# Patient Record
Sex: Female | Born: 1955 | Race: White | Hispanic: No | Marital: Single | Smoking: Former smoker
Health system: Southern US, Community
[De-identification: ages and names within clinical notes are randomized; demographics above are authoritative.]

## PROBLEM LIST (undated history)

## (undated) DIAGNOSIS — Z524 Kidney donor: Secondary | ICD-10-CM

## (undated) DIAGNOSIS — I10 Essential (primary) hypertension: Secondary | ICD-10-CM

## (undated) DIAGNOSIS — M1990 Unspecified osteoarthritis, unspecified site: Secondary | ICD-10-CM

## (undated) DIAGNOSIS — Z973 Presence of spectacles and contact lenses: Secondary | ICD-10-CM

## (undated) DIAGNOSIS — G43909 Migraine, unspecified, not intractable, without status migrainosus: Secondary | ICD-10-CM

## (undated) DIAGNOSIS — F419 Anxiety disorder, unspecified: Secondary | ICD-10-CM

## (undated) DIAGNOSIS — E079 Disorder of thyroid, unspecified: Secondary | ICD-10-CM

## (undated) DIAGNOSIS — T7840XA Allergy, unspecified, initial encounter: Secondary | ICD-10-CM

## (undated) DIAGNOSIS — Z972 Presence of dental prosthetic device (complete) (partial): Secondary | ICD-10-CM

## (undated) DIAGNOSIS — N189 Chronic kidney disease, unspecified: Secondary | ICD-10-CM

## (undated) HISTORY — DX: Allergy, unspecified, initial encounter: T78.40XA

## (undated) HISTORY — PX: BLADDER SURGERY: SHX569

## (undated) HISTORY — DX: Migraine, unspecified, not intractable, without status migrainosus: G43.909

## (undated) HISTORY — DX: Disorder of thyroid, unspecified: E07.9

## (undated) HISTORY — DX: Unspecified osteoarthritis, unspecified site: M19.90

## (undated) HISTORY — DX: Anxiety disorder, unspecified: F41.9

## (undated) HISTORY — PX: THYROID SURGERY: SHX805

## (undated) HISTORY — PX: NECK MASS EXCISION: SHX2079

---

## 1988-01-07 HISTORY — PX: NECK SURGERY: SHX720

## 1989-01-06 HISTORY — PX: ABDOMINAL HYSTERECTOMY: SHX81

## 1997-09-07 ENCOUNTER — Emergency Department (HOSPITAL_COMMUNITY): Admission: EM | Admit: 1997-09-07 | Discharge: 1997-09-08 | Payer: Self-pay | Admitting: Emergency Medicine

## 1997-09-18 ENCOUNTER — Emergency Department (HOSPITAL_COMMUNITY): Admission: EM | Admit: 1997-09-18 | Discharge: 1997-09-18 | Payer: Self-pay | Admitting: Emergency Medicine

## 1997-11-23 ENCOUNTER — Emergency Department (HOSPITAL_COMMUNITY): Admission: EM | Admit: 1997-11-23 | Discharge: 1997-11-23 | Payer: Self-pay

## 1998-06-12 ENCOUNTER — Encounter: Admission: RE | Admit: 1998-06-12 | Discharge: 1998-06-12 | Payer: Self-pay | Admitting: Obstetrics & Gynecology

## 1998-06-12 ENCOUNTER — Other Ambulatory Visit: Admission: RE | Admit: 1998-06-12 | Discharge: 1998-06-12 | Payer: Self-pay | Admitting: Obstetrics & Gynecology

## 1998-06-25 ENCOUNTER — Ambulatory Visit (HOSPITAL_COMMUNITY): Admission: RE | Admit: 1998-06-25 | Discharge: 1998-06-25 | Payer: Self-pay | Admitting: Obstetrics & Gynecology

## 1998-07-24 ENCOUNTER — Ambulatory Visit (HOSPITAL_COMMUNITY): Admission: RE | Admit: 1998-07-24 | Discharge: 1998-07-24 | Payer: Self-pay | Admitting: Obstetrics & Gynecology

## 1998-09-25 ENCOUNTER — Encounter: Admission: RE | Admit: 1998-09-25 | Discharge: 1998-09-25 | Payer: Self-pay | Admitting: Obstetrics & Gynecology

## 1998-10-29 ENCOUNTER — Emergency Department (HOSPITAL_COMMUNITY): Admission: EM | Admit: 1998-10-29 | Discharge: 1998-10-30 | Payer: Self-pay | Admitting: Family Medicine

## 1999-06-22 ENCOUNTER — Emergency Department (HOSPITAL_COMMUNITY): Admission: EM | Admit: 1999-06-22 | Discharge: 1999-06-22 | Payer: Self-pay | Admitting: Internal Medicine

## 2000-02-08 ENCOUNTER — Emergency Department (HOSPITAL_COMMUNITY): Admission: EM | Admit: 2000-02-08 | Discharge: 2000-02-08 | Payer: Self-pay | Admitting: Emergency Medicine

## 2000-04-06 ENCOUNTER — Encounter: Admission: RE | Admit: 2000-04-06 | Discharge: 2000-04-06 | Payer: Self-pay | Admitting: Gastroenterology

## 2000-04-06 ENCOUNTER — Encounter: Payer: Self-pay | Admitting: Gastroenterology

## 2000-05-08 ENCOUNTER — Other Ambulatory Visit: Admission: RE | Admit: 2000-05-08 | Discharge: 2000-05-08 | Payer: Self-pay | Admitting: Obstetrics and Gynecology

## 2001-05-18 ENCOUNTER — Other Ambulatory Visit: Admission: RE | Admit: 2001-05-18 | Discharge: 2001-05-18 | Payer: Self-pay | Admitting: Obstetrics and Gynecology

## 2002-09-05 ENCOUNTER — Other Ambulatory Visit: Admission: RE | Admit: 2002-09-05 | Discharge: 2002-09-05 | Payer: Self-pay | Admitting: Gynecology

## 2003-05-01 ENCOUNTER — Inpatient Hospital Stay (HOSPITAL_COMMUNITY): Admission: EM | Admit: 2003-05-01 | Discharge: 2003-05-03 | Payer: Self-pay | Admitting: Emergency Medicine

## 2003-06-24 ENCOUNTER — Encounter: Admission: RE | Admit: 2003-06-24 | Discharge: 2003-06-24 | Payer: Self-pay | Admitting: Emergency Medicine

## 2004-02-12 ENCOUNTER — Other Ambulatory Visit (HOSPITAL_COMMUNITY): Admission: RE | Admit: 2004-02-12 | Discharge: 2004-02-21 | Payer: Self-pay | Admitting: Psychiatry

## 2004-02-12 ENCOUNTER — Ambulatory Visit: Payer: Self-pay | Admitting: Psychiatry

## 2006-01-06 DIAGNOSIS — Z524 Kidney donor: Secondary | ICD-10-CM

## 2006-01-06 HISTORY — DX: Kidney donor: Z52.4

## 2006-01-06 HISTORY — PX: KIDNEY DONATION: SHX685

## 2006-04-08 ENCOUNTER — Ambulatory Visit: Payer: Self-pay | Admitting: Internal Medicine

## 2006-08-31 ENCOUNTER — Emergency Department (HOSPITAL_COMMUNITY): Admission: EM | Admit: 2006-08-31 | Discharge: 2006-08-31 | Payer: Self-pay | Admitting: Emergency Medicine

## 2006-10-15 ENCOUNTER — Ambulatory Visit: Payer: Self-pay | Admitting: Pulmonary Disease

## 2006-10-15 ENCOUNTER — Ambulatory Visit: Admission: RE | Admit: 2006-10-15 | Discharge: 2006-10-15 | Payer: Self-pay | Admitting: Pulmonary Disease

## 2007-03-05 ENCOUNTER — Ambulatory Visit: Payer: Self-pay | Admitting: Internal Medicine

## 2007-03-05 DIAGNOSIS — F17200 Nicotine dependence, unspecified, uncomplicated: Secondary | ICD-10-CM

## 2007-03-05 DIAGNOSIS — J449 Chronic obstructive pulmonary disease, unspecified: Secondary | ICD-10-CM | POA: Insufficient documentation

## 2007-03-05 DIAGNOSIS — J4489 Other specified chronic obstructive pulmonary disease: Secondary | ICD-10-CM | POA: Insufficient documentation

## 2007-03-05 DIAGNOSIS — J441 Chronic obstructive pulmonary disease with (acute) exacerbation: Secondary | ICD-10-CM | POA: Insufficient documentation

## 2007-03-10 ENCOUNTER — Telehealth (INDEPENDENT_AMBULATORY_CARE_PROVIDER_SITE_OTHER): Payer: Self-pay | Admitting: *Deleted

## 2007-04-01 DIAGNOSIS — Z9189 Other specified personal risk factors, not elsewhere classified: Secondary | ICD-10-CM | POA: Insufficient documentation

## 2007-04-01 DIAGNOSIS — G43909 Migraine, unspecified, not intractable, without status migrainosus: Secondary | ICD-10-CM | POA: Insufficient documentation

## 2007-04-01 DIAGNOSIS — J309 Allergic rhinitis, unspecified: Secondary | ICD-10-CM | POA: Insufficient documentation

## 2007-04-02 ENCOUNTER — Encounter: Payer: Self-pay | Admitting: Pulmonary Disease

## 2009-03-04 ENCOUNTER — Ambulatory Visit (HOSPITAL_COMMUNITY): Admission: RE | Admit: 2009-03-04 | Discharge: 2009-03-04 | Payer: Self-pay | Admitting: Obstetrics and Gynecology

## 2009-03-19 ENCOUNTER — Ambulatory Visit (HOSPITAL_BASED_OUTPATIENT_CLINIC_OR_DEPARTMENT_OTHER): Admission: RE | Admit: 2009-03-19 | Discharge: 2009-03-19 | Payer: Self-pay | Admitting: Urology

## 2009-04-25 ENCOUNTER — Telehealth (INDEPENDENT_AMBULATORY_CARE_PROVIDER_SITE_OTHER): Payer: Self-pay | Admitting: *Deleted

## 2009-08-09 ENCOUNTER — Ambulatory Visit: Payer: Self-pay | Admitting: Internal Medicine

## 2009-08-09 DIAGNOSIS — R634 Abnormal weight loss: Secondary | ICD-10-CM
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
## 2010-02-05 NOTE — Assessment & Plan Note (Signed)
Summary: NEW PT EST // RS   Vital Signs:  Patient profile:   55 year old female Height:      66 inches Weight:      117 pounds BMI:     18.95 Temp:     98.0 degrees F oral BP sitting:   118 / 82  (right arm) Cuff size:   regular  Vitals Entered By: Duard Brady LPN (August 09, 2009 2:35 PM) CC: new to establish - c/o (L) ear 'stopped up' Is Patient Diabetic? No   Primary Care Provider:  Dr. Coralyn Helling  CC:  new to establish - c/o (L) ear 'stopped up'.  History of Present Illness: 55 year old patient who is in today to establish with our practice.  Her main concern today is weight loss.  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  Her weight was hundred and 41 pounds.  Her present weight is 117.  She states that for a number of years.  Her weight had been stable in the 115 range.  She otherwise feels well with a normal appetite.  She is a one half pack per day smoker.  Due to the kidney donation.  The patient has had extensive preoperative evaluation and also post procedure evaluation earlier this spring.  This has included a chest x-ray.  She has also been evaluating by her gynecologist.  Laboratory studies including thyroid studies have been normal. She does have a history of COPD allergic rhinitis and ongoing tobacco use  Preventive Screening-Counseling & Management  Alcohol-Tobacco     Smoking Status: current     Smoking Cessation Counseling: yes  Allergies: 1)  ! Sulfa  Past History:  Past Medical History: Reviewed history from 04/01/2007 and no changes required. COPD - with family history. Seen on CXR. In 10/2006 -> FEV1/FVC ratio of 70%. Her FEV1 was 2.54 liters/86% (per Dr. Evlyn Courier dictation in 10/2006) TOBACCO ABUSE - unable to quit. Chantix intolerance.  Anxiety Sinus issues - on claritin Denies DM, BP, High Cholesterol, STroke, MI, CAD, TB Current Problems:  ALLERGIC RHINITIS (ICD-477.9) CHEST PAIN, ATYPICAL, HX OF  (ICD-V15.89) MIGRAINE HEADACHE (ICD-346.90) TOBACCO ABUSE (ICD-305.1) C O P D (ICD-496) PREOPERATIVE EXAMINATION (ICD-V72.84)    Past Surgical History: colonoscopy 2009 left nephrectomy (Donation) May 2010 Hysterectomy bladder tack 1991 abdominal surgery for adhesions in 1992 right cervical lymphadenectomy 1986 through 1989  Family History: Reviewed history and no changes required. father died age 10, history of COPD from pneumonia mother, age 96, with osteoarthritis, osteoporosis two brothers, both deceased one died at age 32 from a cerebral aneurysm.  One died of suicide death at 34 two sisters, ages 5 and 22 in good health  Social History: Reviewed history and no changes required. 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  The patient denies anorexia, fever, weight loss, weight gain, decreased hearing, hoarseness, chest pain, syncope, dyspnea on exertion, peripheral edema, prolonged cough, headaches, hemoptysis, abdominal pain, melena, hematochezia, severe indigestion/heartburn, hematuria, incontinence, genital sores, muscle weakness, suspicious skin lesions, transient blindness, difficulty walking, depression, unusual weight change, abnormal bleeding, enlarged lymph nodes, angioedema, and breast masses.    Physical Exam  General:  underweight appearing.  normal blood pressure Head:  Normocephalic and atraumatic without obvious abnormalities. No apparent alopecia or balding. Eyes:  No corneal or conjunctival inflammation noted. EOMI. Perrla. Funduscopic exam benign, without  hemorrhages, exudates or papilledema. Vision grossly normal. Ears:  External ear exam shows no significant lesions or deformities.  Otoscopic examination reveals clear canals, tympanic membranes are intact bilaterally without bulging, retraction, inflammation or discharge. Hearing is grossly normal bilaterally. Mouth:  Oral  mucosa and oropharynx without lesions or exudates.  Teeth in good repair. Neck:  No deformities, masses, or tenderness noted. surgical scar, right lateral neck Chest Wall:  No deformities, masses, or tenderness noted. Lungs:  Normal respiratory effort, chest expands symmetrically. Lungs are clear to auscultation, no crackles or wheezes. Heart:  Normal rate and regular rhythm. S1 and S2 normal without gallop, murmur, click, rub or other extra sounds. Abdomen:  Bowel sounds positive,abdomen soft and non-tender without masses, organomegaly or hernias noted. Msk:  No deformity or scoliosis noted of thoracic or lumbar spine.   Pulses:  R and L carotid,radial,femoral,dorsalis pedis and posterior tibial pulses are full and equal bilaterally Extremities:  No clubbing, cyanosis, edema, or deformity noted with normal full range of motion of all joints.   Neurologic:  alert & oriented X3, cranial nerves II-XII intact, strength normal in all extremities, and DTRs symmetrical and normal.  no tremor Skin:  Intact without suspicious lesions or rashes Cervical Nodes:  No lymphadenopathy noted Axillary Nodes:  No palpable lymphadenopathy Inguinal Nodes:  No significant adenopathy Psych:  Cognition and judgment appear intact. Alert and cooperative with normal attention span and concentration. No apparent delusions, illusions, hallucinations   Impression & Recommendations:  Problem # 1:  TOBACCO ABUSE (ICD-305.1)  Problem # 2:  WEIGHT LOSS, RECENT (ICD-783.21) believe that her present weight is her usual lean weight;  will continue to observed.  Doubtful that there will be any further significant weight loss.  She will call the office if there is any further weight reduction  Complete Medication List: 1)  Vitamin B-12 500 Mcg Tabs (Cyanocobalamin) .... Qd 2)  Acetasol Hc 2-1 % Soln (Hydrocortisone-acetic acid) .... Two drops ou qid  Patient Instructions: 1)  Tobacco is very bad for your health and your  loved ones! You Should stop smoking!. 2)  call if there is any further weight loss Prescriptions: ACETASOL HC 2-1 % SOLN (HYDROCORTISONE-ACETIC ACID) two drops OU QID  #10 cc x 2   Entered and Authorized by:   Gordy Savers  MD   Signed by:   Gordy Savers  MD on 08/09/2009   Method used:   Print then Give to Patient   RxID:   [id number]

## 2010-05-21 NOTE — Assessment & Plan Note (Signed)
[facility]                             PULMONARY OFFICE NOTE   NAME:[name]                    MRN:          [id number]  DATE:10/15/2006                            DOB:          06-Dec-1955    I met [name] today for evaluation of her emphysema.   She was seen by my partner, Dr. Sandrea Hughs, in April 2008 with  complaints of her chest hurting. At that time, it was felt that she  probably had some degree of emphysema, and she was advised to have a  chest x-ray and pulmonary function tests. She was also advised to  discontinue her smoking and possibly consider undergoing evaluation for  alpha-1 antitrypsin deficiency. She returns today for a second opinion  with regards to her emphysema. She said that in March of 2008, there was  some Holiday representative around her workplace, and she was exposed to some dust  and some paint, and she said that she developed a tightness and pain in  her chest, which prompted her to undergo a chest x-ray, which showed  changes of emphysema, and that is when she was referred to Dr. Sherene Sires.  Since that time, she says that her chest discomfort has improved,  although she does still get this occasionally, but she also has problems  with allergies when she is exposed to grass, walnuts, and corn. She will  occasionally get itchy eyes, nasal congestion, as well as sneezing. She  will also occasionally get a tickle in her throat, which she says is  better with Claritin, which she uses on an as-needed basis. However, she  denies any symptoms of dyspnea on exertion, coughing, wheezing, sputum  production, or hemoptysis. She has not had anything as far as fevers,  chills, sweats, or weight loss. She continues to smoke cigarettes, but  she has decreased the amount significantly. She says that her stimulants  for this is with the hopes that she will be able to become a organ donor  for her daughter who needs a renal transplant.  She has not had any  recent travel history. She has not had any exposure to farm animals or  birds. There is no significant travel history.   PAST MEDICAL HISTORY:  Significant for migraines and chronic obstructive  pulmonary disease with emphysema.   PAST SURGICAL HISTORY:  Significant for a partial hysterectomy, bladder  tacking, and gland removal from the right side of her neck.   CURRENT MEDICATIONS:  Claritin over-the-counter as needed.   ALLERGIES:  SULFA which causes her to develop a rash.   FAMILY HISTORY:  Significant for her father who had chronic obstructive  pulmonary disease at the age of 78 and her sister who had chronic  obstructive pulmonary disease at the age of 69.   REVIEW OF SYSTEMS:  Unremarkable except for as stated above.   SOCIAL HISTORY:  She continues to smoke about 3 to 4 cigarettes a day.  She denies any significant alcohol use. She works as a Horticulturist, commercial.   PHYSICAL EXAMINATION:  VITAL SIGNS:  Weight 141 pounds, temperature  97.8,  blood pressure 122/78, heart rate 76, oxygen saturation 99% on  room air.  HEENT:  Pupils reactive. There is no sinus tenderness. No nasal  discharge. No oral lesions. No lymphadenopathy.  NECK:  She had surgical changes on the right side of her neck. There is  no thyromegaly.  HEART:  S1, S2.  CHEST:  Decreased breath sounds with a prolonged expiratory phase, but  no wheezing or rales.  ABDOMEN:  Thin, soft, nontender. Positive bowel sounds.  EXTREMITIES:  No cyanosis, clubbing, or edema.  NEUROLOGIC:  No focal deficits were appreciated.   Chest x-ray from today showed hyperinflation and changes with consistent  with emphysema, but no acute disease process.   Pulmonary function tests in my office today showed a post-bronchodilator  FEV1/FVC ratio of 70%. Her FEV1 was 2.54 liters, which was 86% of  predicted. Her FVC was 3.57 liters which was 95% of predicted. There was  no significant bronchodilator response. Her  total lung capacity was 5.15  liters which was 92% predicted. Her diffusion capacity was 86% of  predicted.  These findings would be consistent with mild airflow  obstruction, normal lung volumes, and normal diffusion capacity without  significant bronchodilator response.   IMPRESSION:  1. Chronic obstructive pulmonary disease with emphysema. She, again,      is essentially asymptomatic and therefore, I do not think that she      would need to do anything more except continue on her smoking      cessation regimen. I have discussed with her that given her family      history, there is a possibility that she could have alpha-1      antitrypsin deficiency, and it may be beneficial for her to undergo      further laboratory testing to determine this. She would prefer to      defer this at the present time.  2. I have administered an influenza, as well as pneumococcal      vaccination for her.  3. Atypical chest pain. I have advised her that she should follow up      with her primary physician for further assessment of this.  4. Symptoms of allergic rhinitis. She is to continue on her Claritin      on an as-needed basis.   I have advised her that if she were to notice any worsening of her  symptoms, or if she would like to further investigate the possibility of  an alpha-1 antitrypsin deficiency, that she could call to reschedule a  follow up appointment, otherwise she is to receive the remainder of her  care with her primary care physician.     Coralyn Helling, MD  Electronically Signed    VS/MedQ  DD: 10/15/2006  DT: 10/16/2006  Job #: [id number]
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
## 2010-05-24 NOTE — Assessment & Plan Note (Signed)
[facility]                             PULMONARY OFFICE NOTE   NAME:[name]                    MRN:          [id number]  DATE:04/08/2006                            DOB:          03/16/1955    CHIEF COMPLAINT:  Hurting in chest.   HISTORY:  This is a 55 year old white female, active smoker, who says  she has been hurting on-and-off in her chest for about the last ten  years. The shortest she hurts is from several hours to 4 to 5 days and  typically is triggered by arguments with her daughter. It is not  pleuritic or exertional and more of a generalized chest tightness that  seems some better after using albuterol. She has also been evaluated for  the same discomfort and found to have ulcers, but is not consistent  about taking any medications. Unfortunately, she is consistent about  continuing to smoke and shows very little motivation to stop. She denies  any excessive sputum production, fevers, chills, sweats, orthopnea, PND  or leg swelling and actually is not short of breath with exertion nor  has any exertional or pleuritic chest pain as noted above. Denies  unintended weight loss.   PAST MEDICAL HISTORY:  Significant for:  1. Allergies and sinus problems.  2. Chronic headaches.  3. She has had multiple back and neck surgeries.  4. Remote hysterectomy.   ALLERGIES:  SULFA CAUSES A RASH. SHE GETS SICK IN HER STOMACH FROM  ANTIBIOTICS.   MEDICATIONS:  None regularly. She takes Zyrtec p.r.n.   SOCIAL HISTORY:  She continues to smoke a pack every two days. She works  as a Horticulturist, commercial.   FAMILY HISTORY:  Is positive for emphysema in her mother, father and  sister, all of whom smoked.   REVIEW OF SYSTEMS:  Taken in detail on the worksheet and negative as  outlined above except for occasional acid reflux symptoms.   PHYSICAL EXAMINATION:  This is a depressed, anxious appearing,  ambulatory white female in no acute distress. She  has stable vital  signs.  HEENT: Is unremarkable. Oropharynx is clear.  NECK: Supple without cervical adenopathy or tenderness. Trachea is  midline without thyromegaly.  LUNGS: Lung fields reveal hyperresonant percussion with Hoover sign only  at the end of inspiration.  HEART: Regular rate and rhythm without murmur, gallop or rub. S1, S2  were diminished. There was no increase in pulmonic component to S2.  ABDOMEN: Soft, benign with positive Hoover sign at the very end of  inspiration.  EXTREMITIES: Warm without calf tenderness, cyanosis, clubbing or edema.   Chest x-ray was done at [facility] recently and is not  available.   IMPRESSION:  Chest tightness that goes back over 10 years without any  pleuritic or exertional features, probably is either functionally  related to reflux, which apparently has been already been diagnosed and  for which it was recommended she take Prilosec, but she has not been  compliant. I reviewed the diet with her for reflux and recommended that  she take either Prevacid (samples of a box given)  or Prilosec over-the-  counter perfectly regularly before meals daily.   I would like to see her back for followup PFTs and chest x-ray if not  done in the meantime in 4 to 6 weeks. I spent most of the office visit  talking about the longterm effects of cigarette smoking in the context  of her concern that Chantix costs too much. I tried to call to her  attention the fact that both the direct and indirect costs of smoking  will pale in comparison over the next ten years relative to her cost of  Chantix in the short run.   Finally, I did note that she has a positive family history of emphysema,  but only in smokers. An alpha-1 antitrypsin level should probably be  done to be complete at some point perhaps when she returns for PFTs.     [name]. [name] Sires, MD, [facility]  Electronically Signed    MBW/MedQ  DD: 04/08/2006  DT: 04/08/2006  Job #: [id number]

## 2010-08-29 ENCOUNTER — Emergency Department (HOSPITAL_COMMUNITY): Payer: Self-pay

## 2010-08-29 ENCOUNTER — Emergency Department (HOSPITAL_COMMUNITY)
Admission: EM | Admit: 2010-08-29 | Discharge: 2010-08-29 | Disposition: A | Discharge status: Home / Self Care | Payer: Self-pay | Attending: Emergency Medicine | Admitting: Emergency Medicine

## 2010-08-29 DIAGNOSIS — S52599A Other fractures of lower end of unspecified radius, initial encounter for closed fracture: Secondary | ICD-10-CM | POA: Insufficient documentation

## 2010-08-29 DIAGNOSIS — W19XXXA Unspecified fall, initial encounter: Secondary | ICD-10-CM | POA: Insufficient documentation

## 2010-08-29 DIAGNOSIS — J449 Chronic obstructive pulmonary disease, unspecified: Secondary | ICD-10-CM | POA: Insufficient documentation

## 2010-08-29 DIAGNOSIS — J4489 Other specified chronic obstructive pulmonary disease: Secondary | ICD-10-CM | POA: Insufficient documentation

## 2010-08-29 DIAGNOSIS — M25539 Pain in unspecified wrist: Secondary | ICD-10-CM | POA: Insufficient documentation

## 2010-10-18 LAB — I-STAT 8, (EC8 V) (CONVERTED LAB)
BUN: 14
Chloride: 105
Glucose, Bld: 85
Hemoglobin: 14.3
Potassium: 3.8
Sodium: 139
TCO2: 30

## 2010-10-18 LAB — DIFFERENTIAL
Basophils Absolute: 0
Basophils Relative: 0
Eosinophils Absolute: 0.1
Neutro Abs: 3.9
Neutrophils Relative %: 68

## 2010-10-18 LAB — CBC
HCT: 39.6
MCV: 89.6
RBC: 4.42
WBC: 5.7

## 2010-10-18 LAB — POCT CARDIAC MARKERS
CKMB, poc: <1 — ABNORMAL LOW
Myoglobin, poc: 56.1
Operator id: 151321
Troponin i, poc: <0.05

## 2010-12-26 ENCOUNTER — Emergency Department (INDEPENDENT_AMBULATORY_CARE_PROVIDER_SITE_OTHER): Payer: Self-pay

## 2010-12-26 ENCOUNTER — Emergency Department (INDEPENDENT_AMBULATORY_CARE_PROVIDER_SITE_OTHER)
Admission: EM | Admit: 2010-12-26 | Discharge: 2010-12-26 | Disposition: A | Discharge status: Home / Self Care | Payer: Self-pay | Source: Home / Self Care | Attending: Emergency Medicine | Admitting: Emergency Medicine

## 2010-12-26 ENCOUNTER — Encounter: Payer: Self-pay | Admitting: *Deleted

## 2010-12-26 DIAGNOSIS — M25539 Pain in unspecified wrist: Secondary | ICD-10-CM

## 2010-12-26 DIAGNOSIS — M25532 Pain in left wrist: Secondary | ICD-10-CM

## 2010-12-26 MED ORDER — HYDROCODONE-ACETAMINOPHEN 5-325 MG PO TABS: 1.0000 | ORAL_TABLET | ORAL | Status: AC | PRN

## 2010-12-26 NOTE — ED Notes (Signed)
Pt fell 1 week ago and is here with complaints of left wrist pain.

## 2010-12-26 NOTE — ED Notes (Signed)
Cancel splint wrist order Per MD, Pt states she has one at home.

## 2010-12-26 NOTE — ED Provider Notes (Signed)
History     CSN: [id number]  Arrival date & time 12/26/10  1610   First MD Initiated Contact with Patient 12/26/10 1631      Chief Complaint  Patient presents with  . Wrist Pain   HPI Comments: righthanded female states she tripped and fell landing on her left wrist approx 5 days ago. Unable to remember if landed on outstretched or flexed wrist. Now with inability to flex/extend wrist secondary to pain. Pain aggravated with curling fingers.  No numbness, deformity in hand or distal forearm. Noted bruising today.  Taking tylenol with mild relief. Tried ice, heat w/o improvement. On chart review, pt with mildly impacted and dorsally angulated distal radius fx on 08/2010  Asked pt about fx in Aug. Pt was in brace until Nov. Wrist fx was managed by Dr. Thurston Hole. Apparently wrist has been inflexible since getting out of brace. Pt now states she did not fall 5 days ago. the patient now states she just wanted a second opinion. Is concerned because the home exercises are very painful and has not made significant recovery. Was supposed to see Dr. Thurston Hole yesterday for follow up but cancelled it due to financial concerns.  Patient is a 55 y.o. female presenting with wrist pain. The history is provided by the patient. No language interpreter was used.  Wrist Pain This is a new problem. The current episode started more than 2 days ago. The problem occurs constantly. The problem has been gradually worsening. She has tried acetaminophen and a cold compress for the symptoms. The treatment provided mild relief.    History reviewed. No pertinent past medical history.  Past Surgical History  Procedure Date  . Kidney donation     History reviewed. No pertinent family history.  History  Substance Use Topics  . Smoking status: Current Everyday Smoker  . Smokeless tobacco: Not on file  . Alcohol Use: No    OB History    Grav Para Term Preterm Abortions TAB SAB Ect Mult Living                   Review of Systems  Constitutional: Negative for fever.  Gastrointestinal: Negative for nausea and vomiting.  Musculoskeletal: Positive for arthralgias.  Skin: Positive for color change.  Neurological: Negative for weakness and numbness.  Hematological: Does not bruise/bleed easily.    Allergies  Nsaids and Sulfonamide derivatives  Home Medications   Current Outpatient Rx  Name Route Sig Dispense Refill  . ACETAMINOPHEN 500 MG PO TABS Oral Take 500 mg by mouth every 6 (six) hours as needed.      Marland Kitchen HYDROCODONE-ACETAMINOPHEN 5-325 MG PO TABS Oral Take 1 tablet by mouth every 4 (four) hours as needed for pain. 20 tablet 0    BP 151/93  Pulse 69  Temp(Src) 98 F (36.7 C) (Oral)  Resp 20  SpO2 100%  Physical Exam  Nursing note and vitals reviewed. Constitutional: She is oriented to person, place, and time. She appears well-developed and well-nourished. No distress.  HENT:  Head: Normocephalic and atraumatic.  Eyes: Conjunctivae and EOM are normal. Pupils are equal, round, and reactive to light.  Neck: Normal range of motion.  Cardiovascular: Regular rhythm.   Pulmonary/Chest: Effort normal and breath sounds normal.  Abdominal: She exhibits no distension.  Musculoskeletal: Normal range of motion.       Left Wrist distal radius  tender, distal ulnar styloid NT, snuffbox  tender, carpals tender, metacarpals NT , digits NT r, Motor intact ability  to flex / extend digits of L hand, Sensation LT to hand normal distal to injury, CR<2 seconds distally.  Shoulder and upper arm NT, Elbow and proximal forearm NT. Unable to actively flex/extend wrist. Pain with ulnar deviation. Bruising thenar eminence and volar aspect of wrist.   Neurological: She is alert and oriented to person, place, and time.  Skin: Skin is warm and dry.  Psychiatric: She has a normal mood and affect. Her behavior is normal. Judgment and thought content normal.    ED Course  Procedures (including critical care  time)  Labs Reviewed - No data to display Dg Wrist Complete Left  12/26/2010  *RADIOLOGY REPORT*  Clinical Data: Fall, bruising.  LEFT WRIST - COMPLETE 3+ VIEW  Comparison: 08/29/2010  Findings: Old healed distal left radial fracture.  No acute fracture, subluxation or dislocation.  Degenerative changes in the wrist.  Bone mineralization is normal.  Soft tissues are intact.  IMPRESSION: No acute bony abnormality.  Original Report Authenticated By: Cyndie Chime, M.D.     1. Wrist pain, left       MDM  Pt with briusing ventrally and on thenar eminence. Injury occurred 5 days ago. Pain with flex/ext, ulnar deviation. XR to r/o fx. If neg will immobilize, ice, norco repeat films in 5-7 days.   OTHER DATA REVIEWED: Nursing notes and vital signs reviewed. Prior records reviewed as noted in HPI Imaging reviewed by myself. Report per radiologist.   All results reviewed and discussed, questions answered. Pt's problem most likely from prolonged immobilization. No evidence of nerve, vascular damage or acute bony injury.  Pt states has brace at home and does not need one today. Had extensive discussion on followup with her orthopedic surgeon and that she will require PT. pt agreeable with plan.    Luiz Blare, MD 12/27/10 0010

## 2011-01-23 ENCOUNTER — Ambulatory Visit: Payer: Self-pay | Attending: Orthopedic Surgery | Admitting: Rehabilitative and Restorative Service Providers"

## 2011-01-23 DIAGNOSIS — M6281 Muscle weakness (generalized): Secondary | ICD-10-CM | POA: Insufficient documentation

## 2011-01-23 DIAGNOSIS — IMO0001 Reserved for inherently not codable concepts without codable children: Secondary | ICD-10-CM | POA: Insufficient documentation

## 2011-01-27 ENCOUNTER — Ambulatory Visit: Payer: Self-pay | Admitting: Physical Therapy

## 2011-01-30 ENCOUNTER — Ambulatory Visit: Payer: Self-pay | Admitting: Physical Therapy

## 2011-02-03 ENCOUNTER — Ambulatory Visit: Payer: Self-pay | Admitting: Physical Therapy

## 2011-02-06 ENCOUNTER — Ambulatory Visit: Payer: Self-pay | Admitting: Physical Therapy

## 2011-02-10 ENCOUNTER — Encounter: Payer: Self-pay | Admitting: Physical Therapy

## 2011-02-12 ENCOUNTER — Encounter: Payer: Self-pay | Admitting: Physical Therapy

## 2011-02-12 ENCOUNTER — Ambulatory Visit: Payer: Self-pay | Admitting: Family Medicine

## 2011-02-13 ENCOUNTER — Encounter: Payer: Self-pay | Admitting: Family Medicine

## 2011-02-13 ENCOUNTER — Ambulatory Visit (INDEPENDENT_AMBULATORY_CARE_PROVIDER_SITE_OTHER): Payer: Self-pay | Admitting: Family Medicine

## 2011-02-13 VITALS — BP 148/89 | HR 79 | Ht 67.0 in | Wt 119.0 lb

## 2011-02-13 DIAGNOSIS — M4722 Other spondylosis with radiculopathy, cervical region: Secondary | ICD-10-CM

## 2011-02-13 DIAGNOSIS — M4712 Other spondylosis with myelopathy, cervical region: Secondary | ICD-10-CM

## 2011-02-13 MED ORDER — AMITRIPTYLINE HCL 25 MG PO TABS: 25.0000 mg | ORAL_TABLET | Freq: Every evening | ORAL | Status: DC | PRN

## 2011-02-13 MED ORDER — METHYLPREDNISOLONE 4 MG PO KIT: PACK | ORAL | Status: AC

## 2011-02-13 NOTE — Progress Notes (Signed)
  Subjective:    Patient ID: [name], female    DOB: [birth year], 56 y.o.   MRN: [id number]  HPI  [name] is a pleasant 56 yo female  the patient comes in today complaining of left upper extremity cervical radiculopathy last month. She stated that she fell on 082012 and fractured her left radius and she think that on that for she may have injured her neck as well. Her records show CT of her C-spine 2005 which reports spondylosis of her C-spine on C5-C6 with foraminal narrowing. She been doing physical therapy for her left wrist fracture in 1 day use the TENS unit that produces pain radiated to her neck. Her pain is on and off, sharp pain, radiated to her left upper extremity. Numbness or tingling present. No weakness . She never has done physical therapy for her C-spine  Patient Active Problem List  Diagnoses  . TOBACCO ABUSE  . MIGRAINE HEADACHE  . ALLERGIC RHINITIS  . C O P D  . WEIGHT LOSS, RECENT  . CHEST PAIN, ATYPICAL, HX OF     Current Outpatient Prescriptions on File Prior to Visit  Medication Sig Dispense Refill  . acetaminophen (TYLENOL) 500 MG tablet Take 500 mg by mouth every 6 (six) hours as needed.         Allergies  Allergen Reactions  . 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 Sulfonamide Derivatives      Review of Systems  Constitutional: Negative for fever, chills, diaphoresis and fatigue.  Musculoskeletal: Negative for back pain, joint swelling, arthralgias and gait problem.  Neurological: Negative for weakness and numbness.       Objective:   Physical Exam  Constitutional: She is oriented to person, place, and time. She appears well-developed and well-nourished.       BP 148/89  Pulse 79  Ht 5\' 7"  (1.702 m)  Wt 119 lb (53.978 kg)  BMI 18.64 kg/m2   Pulmonary/Chest: Effort normal.  Musculoskeletal:       Neck  with intact skin. No swelling, no hematomas. FROM  for flexion, extension, rotation and lateralization with pain at  the extremes.  Tenderness to palpation in the posterior aspect of her neck. Spurling test positive to the left Strength 5/5 for shoulder flexion and extension, 5/5 for elbow flexion and extension, 5/5 for wrist flexion and extension B/L. DTR biceps,triceps and brachioradialis II/IV B/L. Sensation intact distally B/L.    Neurological: She is alert and oriented to person, place, and time.  Skin: Skin is warm. No rash noted. No erythema. No pallor.  Psychiatric: She has a normal mood and affect. Her behavior is normal. Thought content normal.          Assessment & Plan:   1. Cervical spondylosis with radiculopathy  methylPREDNISolone (MEDROL, PAK,) 4 MG tablet, DG Cervical Spine Complete, amitriptyline (ELAVIL) 25 MG tablet   C spine x ray Medrol dose pack Amitriptyline 25 mg po q HS I will call her after x rays are completed. She may need PT for her c spine.

## 2011-02-19 ENCOUNTER — Ambulatory Visit (HOSPITAL_COMMUNITY)
Admission: RE | Admit: 2011-02-19 | Discharge: 2011-02-19 | Disposition: A | Discharge status: Home / Self Care | Payer: Self-pay | Source: Ambulatory Visit | Attending: Family Medicine | Admitting: Family Medicine

## 2011-02-19 DIAGNOSIS — M79609 Pain in unspecified limb: Secondary | ICD-10-CM | POA: Insufficient documentation

## 2011-02-19 DIAGNOSIS — M542 Cervicalgia: Secondary | ICD-10-CM | POA: Insufficient documentation

## 2011-02-19 DIAGNOSIS — M5030 Other cervical disc degeneration, unspecified cervical region: Secondary | ICD-10-CM | POA: Insufficient documentation

## 2011-02-20 ENCOUNTER — Telehealth: Payer: Self-pay | Admitting: Internal Medicine

## 2011-02-20 DIAGNOSIS — M5412 Radiculopathy, cervical region: Secondary | ICD-10-CM

## 2011-02-20 MED ORDER — GABAPENTIN 300 MG PO CAPS: ORAL_CAPSULE | ORAL | Status: DC

## 2011-02-20 NOTE — Telephone Encounter (Signed)
Patient called to let you know she had the xrays done yesterday.  She is wondering when she will be contacted with the results.

## 2011-02-20 NOTE — Telephone Encounter (Signed)
We discussed the result of her c spine  x ray showing DDD from c5 to c7.  She will continue PT and we will start her on neurontin 300 mg po hs for 2 weeks and then bid

## 2011-08-16 ENCOUNTER — Emergency Department (HOSPITAL_COMMUNITY)
Admission: EM | Admit: 2011-08-16 | Discharge: 2011-08-16 | Disposition: A | Discharge status: Home / Self Care | Payer: Self-pay | Attending: Emergency Medicine | Admitting: Emergency Medicine

## 2011-08-16 ENCOUNTER — Encounter (HOSPITAL_COMMUNITY): Payer: Self-pay | Admitting: *Deleted

## 2011-08-16 DIAGNOSIS — F17200 Nicotine dependence, unspecified, uncomplicated: Secondary | ICD-10-CM | POA: Insufficient documentation

## 2011-08-16 DIAGNOSIS — E860 Dehydration: Secondary | ICD-10-CM | POA: Insufficient documentation

## 2011-08-16 DIAGNOSIS — K2970 Gastritis, unspecified, without bleeding: Secondary | ICD-10-CM | POA: Insufficient documentation

## 2011-08-16 DIAGNOSIS — N289 Disorder of kidney and ureter, unspecified: Secondary | ICD-10-CM | POA: Insufficient documentation

## 2011-08-16 LAB — POCT I-STAT, CHEM 8
Calcium, Ion: 1.28 mmol/L — ABNORMAL HIGH (ref 1.12–1.23)
HCT: 41 % (ref 36.0–46.0)
Hemoglobin: 13.9 g/dL (ref 12.0–15.0)
Sodium: 142 mEq/L (ref 135–145)
TCO2: 26 mmol/L (ref 0–100)

## 2011-08-16 LAB — CBC WITH DIFFERENTIAL/PLATELET
Basophils Relative: 1 % (ref 0–1)
Eosinophils Absolute: 0.2 10*3/uL (ref 0.0–0.7)
Lymphs Abs: 1.6 10*3/uL (ref 0.7–4.0)
MCH: 31.2 pg (ref 26.0–34.0)
Neutrophils Relative %: 63 % (ref 43–77)
Platelets: 177 10*3/uL (ref 150–400)
RBC: 4.58 MIL/uL (ref 3.87–5.11)

## 2011-08-16 LAB — URINALYSIS, ROUTINE W REFLEX MICROSCOPIC
Bilirubin Urine: NEGATIVE
Hgb urine dipstick: NEGATIVE
Protein, ur: NEGATIVE mg/dL
Urobilinogen, UA: 0.2 mg/dL (ref 0.0–1.0)

## 2011-08-16 LAB — URINE MICROSCOPIC-ADD ON

## 2011-08-16 NOTE — ED Provider Notes (Signed)
History     CSN: [id number]  Arrival date & time 08/16/11  1341   First MD Initiated Contact with Patient 08/16/11 1821      Chief Complaint  Patient presents with  . Abdominal Pain  . Nausea  . Back Pain    (Consider location/radiation/quality/duration/timing/severity/associated sxs/prior treatment) HPI Comments: [name] is a 56 y.o. Female who is here for abdominal pain, back pain, and nausea. Symptoms started one week ago, and waxed and waned. She is using her usual medication, without relief. She continues to smoke. She stay out of work yesterday, because of the discomfort. She denies fever, chills, cough, shortness of breath, or dizziness.  Patient is a 56 y.o. female presenting with abdominal pain and back pain. The history is provided by the patient.  Abdominal Pain The primary symptoms of the illness include abdominal pain.  Additional symptoms associated with the illness include back pain.  Back Pain  Associated symptoms include abdominal pain.    History reviewed. No pertinent past medical history.  Past Surgical History  Procedure Date  . Kidney donation   . Abdominal hysterectomy     partial  . Neck surgery     No family history on file.  History  Substance Use Topics  . Smoking status: Current Everyday Smoker -- 0.3 packs/day  . Smokeless tobacco: Never Used  . Alcohol Use: No    OB History    Grav Para Term Preterm Abortions TAB SAB Ect Mult Living                  Review of Systems  Gastrointestinal: Positive for abdominal pain.  Musculoskeletal: Positive for back pain.  All other systems reviewed and are negative.    Allergies  Ibuprofen; Lactose intolerance (gi); Nsaids; Other; Sudafed; and Sulfonamide derivatives  Home Medications   Current Outpatient Rx  Name Route Sig Dispense Refill  . PEPTO-BISMOL PO Oral Take 50 mLs by mouth as needed. For upset stomach    . IBUPROFEN 200 MG PO TABS Oral Take 400 mg by mouth daily as  needed. For pain      BP 149/97  Pulse 62  Temp 97.9 F (36.6 C) (Oral)  Resp 18  SpO2 98%  Physical Exam  Nursing note and vitals reviewed. Constitutional: She is oriented to person, place, and time. She appears well-developed and well-nourished.  HENT:  Head: Normocephalic and atraumatic.  Eyes: Conjunctivae and EOM are normal. Pupils are equal, round, and reactive to light.  Neck: Normal range of motion and phonation normal. Neck supple.  Cardiovascular: Normal rate, regular rhythm and intact distal pulses.   Pulmonary/Chest: Effort normal and breath sounds normal. She exhibits no tenderness.  Abdominal: Soft. She exhibits no distension. There is tenderness (Mild, diffuse). There is no guarding.  Musculoskeletal: Normal range of motion.  Neurological: She is alert and oriented to person, place, and time. She has normal strength. She exhibits normal muscle tone.  Skin: Skin is warm and dry.  Psychiatric: She has a normal mood and affect. Her behavior is normal. Judgment and thought content normal.    ED Course  Procedures (including critical care time)  Labs Reviewed  URINALYSIS, ROUTINE W REFLEX MICROSCOPIC - Abnormal; Notable for the following:    APPearance CLOUDY (*)     Leukocytes, UA TRACE (*)     All other components within normal limits  POCT I-STAT, CHEM 8 - Abnormal; Notable for the following:    Creatinine, Ser 1.40 (*)  Calcium, Ion 1.28 (*)     All other components within normal limits  URINE MICROSCOPIC-ADD ON - Abnormal; Notable for the following:    Squamous Epithelial / LPF MANY (*)     Bacteria, UA FEW (*)     All other components within normal limits  CBC WITH DIFFERENTIAL  URINE CULTURE   No results found.   1. Dehydration   2. Gastritis       MDM  Nonspecific symptoms, with essentially normal exam. She has mild, renal insufficiency, higher than baseline . Suspect gastritis. Doubt UTI, urine culture sent. Doubt colitis, metabolic  instability, or occult infection or impending vascular collapse.   Plan: Home Medications- Antacid; Home Treatments- rest; Recommended follow up- PCP 1 week        Flint Melter, MD 08/17/11 0031

## 2011-08-16 NOTE — ED Notes (Signed)
Pt is here with lower abdominal pain and lower back pain.  Pt reports nausea with trying to eat, denies vomiting.  No improvement with symptoms.  No fever.  No urinary symptoms

## 2011-08-16 NOTE — ED Notes (Addendum)
Pt states she recently has a change in career and has gone from sitting all day to standing all day. This has caused back pain. The constant pain that makes her nauseated. "Pt reports being sick to her stomach x 2 weeks. And steady weight loss over the last month. Every time she eat she is nauseated". Pt is A.O. X 4. NAD. Sitting up in bed watching tv.

## 2011-08-18 LAB — URINE CULTURE
Colony Count: NO GROWTH
Culture: NO GROWTH

## 2011-09-01 ENCOUNTER — Emergency Department (HOSPITAL_COMMUNITY)
Admission: EM | Admit: 2011-09-01 | Discharge: 2011-09-01 | Disposition: A | Discharge status: Home / Self Care | Payer: Self-pay | Attending: Emergency Medicine | Admitting: Emergency Medicine

## 2011-09-01 ENCOUNTER — Encounter (HOSPITAL_COMMUNITY): Payer: Self-pay

## 2011-09-01 DIAGNOSIS — R600 Localized edema: Secondary | ICD-10-CM

## 2011-09-01 DIAGNOSIS — R030 Elevated blood-pressure reading, without diagnosis of hypertension: Secondary | ICD-10-CM | POA: Insufficient documentation

## 2011-09-01 DIAGNOSIS — Z905 Acquired absence of kidney: Secondary | ICD-10-CM | POA: Insufficient documentation

## 2011-09-01 DIAGNOSIS — F17200 Nicotine dependence, unspecified, uncomplicated: Secondary | ICD-10-CM | POA: Insufficient documentation

## 2011-09-01 DIAGNOSIS — IMO0001 Reserved for inherently not codable concepts without codable children: Secondary | ICD-10-CM

## 2011-09-01 DIAGNOSIS — L989 Disorder of the skin and subcutaneous tissue, unspecified: Secondary | ICD-10-CM

## 2011-09-01 DIAGNOSIS — R609 Edema, unspecified: Secondary | ICD-10-CM | POA: Insufficient documentation

## 2011-09-01 DIAGNOSIS — M549 Dorsalgia, unspecified: Secondary | ICD-10-CM

## 2011-09-01 LAB — URINALYSIS, ROUTINE W REFLEX MICROSCOPIC
Bilirubin Urine: NEGATIVE
Glucose, UA: NEGATIVE mg/dL
Hgb urine dipstick: NEGATIVE
Ketones, ur: NEGATIVE mg/dL
Leukocytes, UA: NEGATIVE
Nitrite: NEGATIVE
Protein, ur: NEGATIVE mg/dL
Specific Gravity, Urine: 1.008 (ref 1.005–1.030)
Urobilinogen, UA: 0.2 mg/dL (ref 0.0–1.0)
pH: 6 (ref 5.0–8.0)

## 2011-09-01 MED ORDER — ACETAMINOPHEN 325 MG PO TABS: 650.0000 mg | ORAL_TABLET | Freq: Once | ORAL | Status: DC

## 2011-09-01 NOTE — ED Notes (Signed)
Unable to locate pt  

## 2011-09-01 NOTE — ED Provider Notes (Signed)
History   This chart was scribed for [name]. [name] Lamas, MD by Melba Coon. The patient was seen in room TR11C/TR11C and the patient's care was started at 9:10AM.    CSN: [id number]  Arrival date & time 09/01/11  0848   None     Chief Complaint  Patient presents with  . Back Pain  . Foot Burn  . Nevus    (Consider location/radiation/quality/duration/timing/severity/associated sxs/prior treatment) The history is provided by the patient. No language interpreter was used.   [name] is a 56 y.o. female who presents to the Emergency Department complaining of constant, mild to moderate back pain and foot swelling with an onset yesterday. Pt also c/o black mole on her back. Pt states that she started working 2 weeks ago for the first time in a year. Pt states that she stands on her feet in one position for hours at a time and thinks that may be why she is having the present symptoms. No HA, fever, neck pain, sore throat, rash, CP, SOB, abd pain, n/v/d, dysuria, urinary or bowel dysfunction, or extremity pain, weakness, numbness, or tingling. 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 Pt also states that she has Hx of high creatine levels. No other pertinent medical symptoms.  No PCP  History reviewed. No pertinent past medical history.  Past Surgical History  Procedure Date  . Kidney donation   . Abdominal hysterectomy     partial  . Neck surgery     History reviewed. No pertinent family history.  History  Substance Use Topics  . Smoking status: Current Everyday Smoker -- 0.3 packs/day  . Smokeless tobacco: Never Used  . Alcohol Use: No    OB History    Grav Para Term Preterm Abortions TAB SAB Ect Mult Living                  Review of Systems 10 Systems reviewed and all are negative for acute change except as noted in the HPI.   Allergies  Ibuprofen; Lactose intolerance (gi); Nsaids; Other; Sudafed; and Sulfonamide  derivatives  Home Medications   Current Outpatient Rx  Name Route Sig Dispense Refill  . IBUPROFEN 200 MG PO TABS Oral Take 400 mg by mouth daily as needed. For pain      BP 173/105  Pulse 58  Temp 97.5 F (36.4 C) (Oral)  Resp 18  SpO2 99%  Physical Exam  Nursing note and vitals reviewed. Constitutional: She is oriented to person, place, and time. She appears well-developed and well-nourished. No distress.  HENT:  Head: Normocephalic and atraumatic.  Eyes: EOM are normal.  Neck: Neck supple. No tracheal deviation present.  Cardiovascular: Normal rate and intact distal pulses.        Good distal pulses.  Pulmonary/Chest: Effort normal. No respiratory distress.  Musculoskeletal: Normal range of motion. She exhibits edema (minimal pedal edema, left foot worse than right).       Cervical back: She exhibits no bony tenderness.       Thoracic back: She exhibits no bony tenderness.       Lumbar back: She exhibits no bony tenderness.       No back muscular spasms. Good ROM.  Neurological: She is alert and oriented to person, place, and time.       Nml gait.  Skin: Skin is warm and dry. No rash (no general rash) noted.  Psychiatric: She has a normal mood  and affect. Her behavior is normal.    ED Course  Procedures (including critical care time)  DIAGNOSTIC STUDIES: Oxygen Saturation is 99% on room air, normal by my interpretation.    COORDINATION OF CARE:  9:14AM - pt is advised to get a PCP; resources are provided for her. Blood w/u and UA will be ordered for the pt.    Labs Reviewed  URINALYSIS, ROUTINE W REFLEX MICROSCOPIC  CBC  BASIC METABOLIC PANEL   No results found.   1. Pedal edema   2. Back pain   3. Skin lesion   4. Elevated blood pressure     10:08 AM Pt may have taken offense to having had suggestions that she needs a PCP to follow up with.  It was made clear that I wished to obtain blood tests and check her urine.  I was not able to examine her  mole at this time as she was still fully clothed on my initial exam.  She has left apparently without informing staff.    MDM  I personally performed the services described in this documentation, which was scribed in my presence. The recorded information has been reviewed and considered.   Discussed need to keep lower legs elevated.  Edema is minimal, no signs or risks for DVT, Neg Homans', no evidence of infection or cellulitis. Given single kidney, will check UA and Cr, however urine is quite clear and she reports no decrease in urine output.  I have urged follow up with a PCP regarding moles.  She voices understanding.  She lost insurance and so has not seen PCP in quite some time.  However she reports she does see an OB/GYN yearly.  She can discuss further with OB/GYN.  If labs ok, pt counseled to keep legs elevated, Ted hose is an option.  She has no CP, SOB to suggests CHF and no prior h/o HTN or CAD.  BP is high here, will recheck.  Doubt end organ failure without any symptoms or signs.         [name]. [name], MD 09/01/11 1011

## 2011-09-01 NOTE — ED Notes (Signed)
Pt here for back pain, feet swelling, and a black mole to back.

## 2011-09-07 ENCOUNTER — Encounter (HOSPITAL_COMMUNITY): Payer: Self-pay | Admitting: Emergency Medicine

## 2011-09-07 ENCOUNTER — Emergency Department (HOSPITAL_COMMUNITY)
Admission: EM | Admit: 2011-09-07 | Discharge: 2011-09-07 | Disposition: A | Discharge status: Home / Self Care | Payer: Self-pay | Attending: Emergency Medicine | Admitting: Emergency Medicine

## 2011-09-07 DIAGNOSIS — I872 Venous insufficiency (chronic) (peripheral): Secondary | ICD-10-CM

## 2011-09-07 DIAGNOSIS — F17200 Nicotine dependence, unspecified, uncomplicated: Secondary | ICD-10-CM | POA: Insufficient documentation

## 2011-09-07 DIAGNOSIS — R609 Edema, unspecified: Secondary | ICD-10-CM | POA: Insufficient documentation

## 2011-09-07 NOTE — ED Notes (Signed)
Pt c/o bilateral ankle/foot swelling x 2 weeks.

## 2011-09-07 NOTE — ED Provider Notes (Signed)
History     CSN: [id number]  Arrival date & time 09/07/11  1501   First MD Initiated Contact with Patient 09/07/11 1609      Chief Complaint  Patient presents with  . Joint Swelling    (Consider location/radiation/quality/duration/timing/severity/associated sxs/prior treatment) HPI Comments: Pt notes that her lower legs and feet have been swollen for the past 2-3 weeks.    She denies any pain.  No SOB.  No h/o CHF.  No CP.  "i just want to make sure that i am OK".  The history is provided by the patient. No language interpreter was used.    History reviewed. No pertinent past medical history.  Past Surgical History  Procedure Date  . Kidney donation   . Abdominal hysterectomy     partial  . Neck surgery   . Bladder surgery   . Bowel resection     No family history on file.  History  Substance Use Topics  . Smoking status: Current Everyday Smoker -- 0.3 packs/day  . Smokeless tobacco: Never Used  . Alcohol Use: No    OB History    Grav Para Term Preterm Abortions TAB SAB Ect Mult Living                  Review of Systems  Constitutional: Positive for chills. Negative for fever.  Respiratory: Negative for cough, shortness of breath and wheezing.   Cardiovascular: Positive for leg swelling. Negative for chest pain.  Gastrointestinal: Negative for nausea and vomiting.  Neurological: Negative for weakness.  All other systems reviewed and are negative.    Allergies  Ibuprofen; Lactose intolerance (gi); Nsaids; Other; Sudafed; and Sulfonamide derivatives  Home Medications   Current Outpatient Rx  Name Route Sig Dispense Refill  . IBUPROFEN 200 MG PO TABS Oral Take 400 mg by mouth daily as needed. For pain      BP 169/90  Pulse 68  Temp 98.5 F (36.9 C) (Oral)  Resp 18  Ht 5\' 7"  (1.702 m)  Wt 115 lb (52.164 kg)  BMI 18.01 kg/m2  SpO2 99%  Physical Exam  Nursing note and vitals reviewed. Constitutional: She is oriented to person, place, and time.  She appears well-developed and well-nourished. No distress.  HENT:  Head: Normocephalic and atraumatic.  Eyes: EOM are normal.  Neck: Normal range of motion.  Cardiovascular: Normal rate, regular rhythm, S1 normal, S2 normal, normal heart sounds and normal pulses.  PMI is not displaced.  Exam reveals no gallop.   No murmur heard. Pulses:      Dorsalis pedis pulses are 2+ on the right side, and 2+ on the left side.       Posterior tibial pulses are 2+ on the right side, and 2+ on the left side.  Pulmonary/Chest: Effort normal and breath sounds normal. No accessory muscle usage. Not tachypneic. No respiratory distress. She has no decreased breath sounds. She has no wheezes. She has no rhonchi. She has no rales. She exhibits no tenderness.  Abdominal: Soft. She exhibits no distension. There is no tenderness.  Musculoskeletal: Normal range of motion. She exhibits edema.       Legs: Neurological: She is alert and oriented to person, place, and time.  Skin: Skin is warm and dry.  Psychiatric: She has a normal mood and affect. Judgment normal.    ED Course  Procedures (including critical care time)  Labs Reviewed - No data to display No results found.   No diagnosis found.  MDM  no signs or sxs of CHF.  Exam c/w venous insufficiency.  Elevate legs frequently.  Restrict salt intake.  Return if sxs worsen or change.        Evalina Field, [place] 09/07/11 [phone]

## 2011-09-07 NOTE — ED Provider Notes (Signed)
Medical screening examination/treatment/procedure(s) were performed by non-physician practitioner and as supervising physician I was immediately available for consultation/collaboration.   Jari Dipasquale L Aivah Putman, MD 09/07/11 2241 

## 2012-07-09 ENCOUNTER — Encounter (HOSPITAL_COMMUNITY): Payer: Self-pay | Admitting: *Deleted

## 2012-07-09 ENCOUNTER — Emergency Department (HOSPITAL_COMMUNITY)
Admission: EM | Admit: 2012-07-09 | Discharge: 2012-07-09 | Disposition: A | Discharge status: Home / Self Care | Payer: Self-pay | Attending: Emergency Medicine | Admitting: Emergency Medicine

## 2012-07-09 DIAGNOSIS — M542 Cervicalgia: Secondary | ICD-10-CM | POA: Insufficient documentation

## 2012-07-09 DIAGNOSIS — M25439 Effusion, unspecified wrist: Secondary | ICD-10-CM | POA: Insufficient documentation

## 2012-07-09 DIAGNOSIS — M65839 Other synovitis and tenosynovitis, unspecified forearm: Secondary | ICD-10-CM | POA: Insufficient documentation

## 2012-07-09 DIAGNOSIS — Z9889 Other specified postprocedural states: Secondary | ICD-10-CM | POA: Insufficient documentation

## 2012-07-09 DIAGNOSIS — M65849 Other synovitis and tenosynovitis, unspecified hand: Secondary | ICD-10-CM | POA: Insufficient documentation

## 2012-07-09 DIAGNOSIS — M778 Other enthesopathies, not elsewhere classified: Secondary | ICD-10-CM

## 2012-07-09 DIAGNOSIS — F17200 Nicotine dependence, unspecified, uncomplicated: Secondary | ICD-10-CM | POA: Insufficient documentation

## 2012-07-09 NOTE — ED Notes (Signed)
Pt c/o sharp pain in bilateral wrist. Pt also has a knot on her left arm and right ring finger.

## 2012-07-09 NOTE — ED Provider Notes (Signed)
History    CSN: [id number] Arrival date & time 07/09/12  2138  First MD Initiated Contact with Patient 07/09/12 2153     Chief Complaint  Patient presents with  . Wrist Pain   (Consider location/radiation/quality/duration/timing/severity/associated sxs/prior Treatment) Patient is a 57 y.o. female presenting with wrist pain. The history is provided by the patient.  Wrist Pain This is a new problem. The current episode started 1 to 4 weeks ago. The problem has been gradually worsening. Associated symptoms include arthralgias, joint swelling and neck pain. Pertinent negatives include no abdominal pain, chest pain or coughing. Exacerbated by: using right hand and wrist. She has tried acetaminophen for the symptoms. The treatment provided mild relief.   History reviewed. No pertinent past medical history. Past Surgical History  Procedure Laterality Date  . Kidney donation    . Abdominal hysterectomy      partial  . Neck surgery    . Bladder surgery    . Bowel resection     No family history on file. History  Substance Use Topics  . Smoking status: Current Every Day Smoker -- 0.30 packs/day  . Smokeless tobacco: Never Used  . Alcohol Use: No   OB History   Grav Para Term Preterm Abortions TAB SAB Ect Mult Living                 Review of Systems  Constitutional: Negative for activity change.       All ROS Neg except as noted in HPI  HENT: Positive for neck pain. Negative for nosebleeds.   Eyes: Negative for photophobia and discharge.  Respiratory: Negative for cough, shortness of breath and wheezing.   Cardiovascular: Negative for chest pain and palpitations.  Gastrointestinal: Negative for abdominal pain and blood in stool.  Genitourinary: Negative for dysuria, frequency and hematuria.  Musculoskeletal: Positive for joint swelling and arthralgias. Negative for back pain.  Skin: Negative.   Neurological: Negative for dizziness, seizures and speech difficulty.   Psychiatric/Behavioral: Negative for hallucinations and confusion.    Allergies  Ibuprofen; Lactose intolerance (gi); Nsaids; Other; Sudafed; and Sulfonamide derivatives  Home Medications   Current Outpatient Rx  Name  Route  Sig  Dispense  Refill  . ibuprofen (ADVIL,MOTRIN) 200 MG tablet   Oral   Take 400 mg by mouth daily as needed. For pain          BP 164/87  Pulse 79  Temp(Src) 98.5 F (36.9 C)  Resp 20  Ht 5\' 7"  (1.702 m)  Wt 115 lb (52.164 kg)  BMI 18.01 kg/m2  SpO2 100% Physical Exam  Nursing note and vitals reviewed. Constitutional: She is oriented to person, place, and time. She appears well-developed and well-nourished.  Non-toxic appearance.  HENT:  Head: Normocephalic.  Right Ear: Tympanic membrane and external ear normal.  Left Ear: Tympanic membrane and external ear normal.  Eyes: EOM and lids are normal. Pupils are equal, round, and reactive to light.  Neck: Normal range of motion. Neck supple. Carotid bruit is not present.  Cardiovascular: Normal rate, regular rhythm, normal heart sounds, intact distal pulses and normal pulses.   Pulmonary/Chest: Breath sounds normal. No respiratory distress.  Abdominal: Soft. Bowel sounds are normal. There is no tenderness. There is no guarding.  Musculoskeletal: Normal range of motion.  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 There is good range of motion of the fingers of the right hand. There is pain and soreness to pronation of the right wrist.  There is pain with flexing and extending the wrist. There is no deformity appreciated. Radial pulses and brachial pulses are 2+. Sensory is intact of the upper extremity.  Lymphadenopathy:       Head (right side): No submandibular adenopathy present.       Head (left side): No submandibular adenopathy present.    She has no cervical adenopathy.  Neurological: She is alert and oriented to person, place, and time. She has normal strength. No cranial  nerve deficit or sensory deficit.  Skin: Skin is warm and dry.  Psychiatric: She has a normal mood and affect. Her speech is normal.    ED Course  Procedures (including critical care time) Labs Reviewed - No data to display No results found. 1. Tendonitis of wrist, right     MDM  I have reviewed nursing notes, vital signs, and all appropriate lab and imaging results for this patient. Patient has been working a new type of job over the last 2-3 weeks that involves a lot of use of the hand wrist and shoulder. She states that she is right-handed. She has noticed in the last week to 10 days that she has pain with certain movement involving the thumb and wrist.  The examination is consistent with tendinitis.  Plan at this time is to admit the patient with a thumb spica splint. Patient is advised to use topical arthritis crane and Tylenol she cannot take other types of medication. Patient is further advised to see the orthopedic physician for additional evaluation and to rule out the need for more aggressive therapy or treatment.  Kathie Dike, PA-C 07/09/12 2227

## 2012-07-09 NOTE — ED Notes (Addendum)
Both wrists have hurt for 3 weeks,  Hurt worse in am when awakening.  Alert, NAD  No swelling,

## 2012-07-10 NOTE — ED Provider Notes (Signed)
Medical screening examination/treatment/procedure(s) were performed by non-physician practitioner and as supervising physician I was immediately available for consultation/collaboration.  Jazyah Butsch R. Yulia Ulrich, MD 07/10/12 2152 

## 2012-07-19 ENCOUNTER — Encounter (HOSPITAL_COMMUNITY): Payer: Self-pay | Admitting: *Deleted

## 2012-07-19 ENCOUNTER — Emergency Department (HOSPITAL_COMMUNITY)
Admission: EM | Admit: 2012-07-19 | Discharge: 2012-07-19 | Disposition: A | Discharge status: Home / Self Care | Payer: Self-pay | Attending: Emergency Medicine | Admitting: Emergency Medicine

## 2012-07-19 DIAGNOSIS — M65839 Other synovitis and tenosynovitis, unspecified forearm: Secondary | ICD-10-CM | POA: Insufficient documentation

## 2012-07-19 DIAGNOSIS — F17200 Nicotine dependence, unspecified, uncomplicated: Secondary | ICD-10-CM | POA: Insufficient documentation

## 2012-07-19 DIAGNOSIS — M778 Other enthesopathies, not elsewhere classified: Secondary | ICD-10-CM

## 2012-07-19 MED ORDER — ACETAMINOPHEN 325 MG PO TABS: 325.0000 mg | ORAL_TABLET | Freq: Four times a day (QID) | ORAL | Status: DC | PRN

## 2012-07-19 NOTE — ED Notes (Signed)
Pt states since she started work in April she's had R wrist/thumb pain that shoot up arm, states went to see doctor and told it was tendonititis, states has worn a splint before, but pain has worsened over 3 weeks.

## 2012-07-19 NOTE — ED Provider Notes (Signed)
History  This chart was scribed for [name] Mutton, PA-C working with Enid Skeens, MD by Greggory Stallion, ED scribe. This patient was seen in room WTR9/WTR9 and the patient's care was started at 3:10 PM.  CSN: [id number] Arrival date & time 07/19/12  1457   Chief Complaint  Patient presents with  . Wrist Pain    right   The history is provided by the patient. No language interpreter was used.    HPI Comments: [name] is a 57 y.o. female who presents to the Emergency Department complaining of gradually worsening, constant sharp right wrist and thumb pain that shoots up her right arm that started four months ago. She denies injury to wrist, fall. Pt states when she is a rest her wrist is fine. She states the pain is worsened with movement. Pt was recently seen at [facility] and given thumb spica splint, topical creams and advisement to follow up with orthopaedic. Pt states she took Aleve for the pain with no relief. She states she took the wrist splint off at work because she thought she would be told to go home. Pt states she did not follow up with orthopaedics because she doesn't have insurance. She states she does not want xrays done because she doesn't have insurance and will have to pay out of pocket for them. Pt denies shoulder pain, numbness, tingling, and weakness as associated symptoms.   History reviewed. No pertinent past medical history. Past Surgical History  Procedure Laterality Date  . Kidney donation    . Abdominal hysterectomy      partial  . Neck surgery    . Bladder surgery    . Bowel resection     No family history on file. History  Substance Use Topics  . Smoking status: Current Every Day Smoker -- 0.30 packs/day  . Smokeless tobacco: Never Used  . Alcohol Use: No   OB History   Grav Para Term Preterm Abortions TAB SAB Ect Mult Living                 Review of Systems  Musculoskeletal: Positive for arthralgias.  Neurological: Negative  for weakness and numbness.  All other systems reviewed and are negative.    Allergies  Ibuprofen; Lactose intolerance (gi); Nsaids; Other; Sudafed; and Sulfonamide derivatives  Home Medications   Current Outpatient Rx  Name  Route  Sig  Dispense  Refill  . acetaminophen (TYLENOL) 325 MG tablet   Oral   Take 1 tablet (325 mg total) by mouth every 6 (six) hours as needed for pain.   15 tablet   0     BP 144/90  Pulse 69  Temp(Src) 98.5 F (36.9 C) (Oral)  Resp 16  Ht 5\' 7"  (1.702 m)  Wt 115 lb (52.164 kg)  BMI 18.01 kg/m2  SpO2 99%  Physical Exam  Nursing note and vitals reviewed. Constitutional: She is oriented to person, place, and time. She appears well-developed and well-nourished. No distress.  HENT:  Head: Normocephalic and atraumatic.  Eyes: Conjunctivae and EOM are normal. Pupils are equal, round, and reactive to light. Right eye exhibits no discharge. Left eye exhibits no discharge.  Neck: Normal range of motion. Neck supple. No tracheal deviation present.  Cardiovascular: Normal rate, regular rhythm and normal heart sounds.   No murmur heard. Radial pulses 2+  Pulmonary/Chest: Effort normal and breath sounds normal. No respiratory distress.  Musculoskeletal: Normal range of motion. She exhibits tenderness. She exhibits no  edema.  Negative swelling, erythema, inflammation, deformity, ecchymosis to right upper extremity. Pain with palpation of radial aspect of right wrist. Negative swelling to the thenar region. Negative snuff box tenderness to right hand. Negative Phalen's. Negative Tinel's.  Full ROM to upper extremities bilaterally. Strength 5+/5+ with resistance  Neurological: She is alert and oriented to person, place, and time. She exhibits normal muscle tone. Coordination normal.  Sensation intact with differentiation to sharp and dull touch.   Skin: Skin is warm and dry.  Psychiatric: She has a normal mood and affect. Her behavior is normal.    ED  Course  Procedures (including critical care time)  DIAGNOSTIC STUDIES: Oxygen Saturation is 99% on RA, normal by my interpretation.    COORDINATION OF CARE: 4:08 PM-Discussed treatment plan which includes Tylenol and wrist splint with pt at bedside and pt agreed to plan. Advised pt to massage icy hot on her wrist while resting. Advised pt to follow up with the [facility].   Labs Reviewed - No data to display No results found. 1. Tendonitis of wrist, right     MDM  I personally performed the services described in this documentation, which was scribed in my presence. The recorded information has been reviewed and is accurate.  [name] is a 57 y/o F presenting to the ED with right wrist discomfort that has been ongoing for the past 4 months - negative pain at rest, with motion gets a sharp pain that radiates up the shoulder. Patient was seen in ED on 07/09/2012 where she was diagnosed with tendonitis of the right wrist and hand - placed in a thumb spica splint. Patient reported that she has not been wearing the thumb spica splint due to inability of using it when working. Stated that she has been using Aleves with minimal relief - cannot use NSAIDs because she had one kidney. Denied injury, fall, weakness. Full ROM to the right wrist, strength intact, pulses palpable, sensation intact. Negative deformities, erythema, inflammation, ecchymosis, swelling noted to the right upper extremities. Negative focal neurological deficits noted. Suspicion to be possible arthritis, tendonitis, possible beginnings of carpel tunnel. Patient stable, afebrile. Placed patient is wrist brace - discussed when to keep on and off. Discussed with patient to use Tylenol as needed. Recommended icy-hot ointment to massage with slow motions. Discussed with patient water therapy - educated on how to perform at home. Referred patient to [facility] and orthopedics. Discussed with patient to avoid strenuous  activity. Discussed with patient to get BUN and Creatinine re-checked since it has been a year. Discussed with patient to continue to monitor symptoms and if symptoms are to worsen or change to report back to the ED - strict return instructions given. Patient agreed to plan of care, understood, all questions answered.    [name] Mutton, PA-C 07/20/12 1631

## 2012-07-21 NOTE — ED Provider Notes (Signed)
Medical screening examination/treatment/procedure(s) were performed by non-physician practitioner and as supervising physician I was immediately available for consultation/collaboration.   Enid Skeens, MD 07/21/12 [phone]

## 2013-02-02 ENCOUNTER — Other Ambulatory Visit: Payer: Self-pay | Admitting: Orthopedic Surgery

## 2013-02-03 NOTE — H&P (Signed)
[name] is an 58 y.o. female.   CC / Reason for Visit: Right radial wrist pain HPI: This patient returns for reevaluation noting that the de Quervain's injection given on 08-18-12 was quite helpful until just recently .  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  She has recently been doing work [employer].  This has exacerbated her pain.  She has a variety of wrist splints, none of which are present today.  No past medical history on file.  Past Surgical History  Procedure Laterality Date  . Kidney donation    . Abdominal hysterectomy      partial  . Neck surgery    . Bladder surgery    . Bowel resection      No family history on file. Social History:  reports that she has been smoking.  She has never used smokeless tobacco. She reports that she does not drink alcohol or use illicit drugs.  Allergies:  Allergies  Allergen Reactions  . Ibuprofen     Not supposed to take everyday due to only having one kidney  . Lactose Intolerance (Gi) Diarrhea    cramping  . 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 Other Nausea And Vomiting    Most pain meds and antibiotics  . Sudafed [Pseudoephedrine Hcl]     Makes "high"  . Sulfonamide Derivatives Rash    No prescriptions prior to admission    No results found for this or any previous visit (from the past 48 hour(s)). No results found.  Review of Systems  All other systems reviewed and are negative.    There were no vitals taken for this visit. Physical Exam  Constitutional:  WD, WN, NAD HEENT:  NCAT, EOMI Neuro/Psych:  Alert & oriented to person, place, and time; appropriate mood &  affect Lymphatic: No generalized UE edema or lymphadenopathy Extremities / MSK:  Both UE are normal with respect to appearance, ranges of motion, joint stability, muscle strength/tone, sensation, & perfusion except as otherwise noted:  Right wrist tender over the first dorsal compartment tendons, positive Finkelstein and EPB stress.  Negative TMC stress and grind testing. Right shoulder positive impingement signs  Labs / Xrays:  No radiographic studies obtained today.  Assessment: Right wrist de Quervain's and shoulder subacromial bursitis/impingement  Plan:  I discussed these findings with the patient.  We reviewed the ways in which she might treat her de Quervain's through stretching and splint use.  Rather than 2 different steroid injections today, we decided on it 12 day course of oral steroids followed by resumption of her one daily Aleve given her single kidney.  If this fails to fully alleviate her symptoms, she may call the office if she wishes to proceed with a de Quervain's release.   Prince Olivier A. 02/03/2013, 9:05 AM

## 2013-02-04 ENCOUNTER — Encounter (HOSPITAL_BASED_OUTPATIENT_CLINIC_OR_DEPARTMENT_OTHER): Payer: Self-pay | Admitting: *Deleted

## 2013-02-04 NOTE — Progress Notes (Signed)
No labs needed-did give a kidney to her daughter 5 yr ago

## 2013-02-07 ENCOUNTER — Encounter (HOSPITAL_BASED_OUTPATIENT_CLINIC_OR_DEPARTMENT_OTHER): Payer: Self-pay | Admitting: Certified Registered"

## 2013-02-07 ENCOUNTER — Ambulatory Visit (HOSPITAL_BASED_OUTPATIENT_CLINIC_OR_DEPARTMENT_OTHER)
Admission: RE | Admit: 2013-02-07 | Discharge: 2013-02-07 | Disposition: A | Discharge status: Home / Self Care | Payer: 59 | Source: Ambulatory Visit | Attending: Orthopedic Surgery | Admitting: Orthopedic Surgery

## 2013-02-07 ENCOUNTER — Encounter (HOSPITAL_BASED_OUTPATIENT_CLINIC_OR_DEPARTMENT_OTHER)
Admission: RE | Disposition: A | Discharge status: Home / Self Care | Payer: Self-pay | Source: Ambulatory Visit | Attending: Orthopedic Surgery

## 2013-02-07 ENCOUNTER — Encounter (HOSPITAL_BASED_OUTPATIENT_CLINIC_OR_DEPARTMENT_OTHER): Payer: 59 | Admitting: Certified Registered"

## 2013-02-07 ENCOUNTER — Ambulatory Visit (HOSPITAL_BASED_OUTPATIENT_CLINIC_OR_DEPARTMENT_OTHER): Payer: 59 | Admitting: Certified Registered"

## 2013-02-07 DIAGNOSIS — J449 Chronic obstructive pulmonary disease, unspecified: Secondary | ICD-10-CM | POA: Insufficient documentation

## 2013-02-07 DIAGNOSIS — J4489 Other specified chronic obstructive pulmonary disease: Secondary | ICD-10-CM | POA: Insufficient documentation

## 2013-02-07 DIAGNOSIS — M654 Radial styloid tenosynovitis [de Quervain]: Secondary | ICD-10-CM | POA: Insufficient documentation

## 2013-02-07 DIAGNOSIS — F17200 Nicotine dependence, unspecified, uncomplicated: Secondary | ICD-10-CM | POA: Insufficient documentation

## 2013-02-07 HISTORY — DX: Kidney donor: Z52.4

## 2013-02-07 HISTORY — DX: Presence of dental prosthetic device (complete) (partial): Z97.2

## 2013-02-07 HISTORY — DX: Presence of spectacles and contact lenses: Z97.3

## 2013-02-07 HISTORY — PX: DORSAL COMPARTMENT RELEASE: SHX5039

## 2013-02-07 LAB — POCT HEMOGLOBIN-HEMACUE: Hemoglobin: 16.2 g/dL — ABNORMAL HIGH (ref 12.0–15.0)

## 2013-02-07 SURGERY — RELEASE, FIRST DORSAL COMPARTMENT, HAND
Anesthesia: General | Site: Wrist | Laterality: Right

## 2013-02-07 MED ORDER — OXYCODONE-ACETAMINOPHEN 5-325 MG PO TABS: 1.0000 | ORAL_TABLET | ORAL | Status: DC | PRN

## 2013-02-07 MED ORDER — MIDAZOLAM HCL 2 MG/2ML IJ SOLN
INTRAMUSCULAR | Status: AC
Start: 2013-02-07 — End: 2013-02-07
  Filled 2013-02-07: qty 2

## 2013-02-07 MED ORDER — LIDOCAINE HCL (PF) 1 % IJ SOLN
INTRAMUSCULAR | Status: AC
  Filled 2013-02-07: qty 30

## 2013-02-07 MED ORDER — FENTANYL CITRATE 0.05 MG/ML IJ SOLN
INTRAMUSCULAR | Status: AC
  Filled 2013-02-07: qty 6

## 2013-02-07 MED ORDER — SCOPOLAMINE 1 MG/3DAYS TD PT72
MEDICATED_PATCH | TRANSDERMAL | Status: AC
  Filled 2013-02-07: qty 1

## 2013-02-07 MED ORDER — PROPOFOL INFUSION 10 MG/ML OPTIME
INTRAVENOUS | Status: DC | PRN
  Administered 2013-02-07: 75 ug/kg/min via INTRAVENOUS

## 2013-02-07 MED ORDER — FENTANYL CITRATE 0.05 MG/ML IJ SOLN: 50.0000 ug | INTRAMUSCULAR | Status: DC | PRN

## 2013-02-07 MED ORDER — FENTANYL CITRATE 0.05 MG/ML IJ SOLN: 25.0000 ug | INTRAMUSCULAR | Status: DC | PRN

## 2013-02-07 MED ORDER — OXYCODONE HCL 5 MG/5ML PO SOLN: 5.0000 mg | Freq: Once | ORAL | Status: DC | PRN

## 2013-02-07 MED ORDER — CEFAZOLIN SODIUM-DEXTROSE 2-3 GM-% IV SOLR
2.0000 g | INTRAVENOUS | Status: AC
  Administered 2013-02-07: 2 g via INTRAVENOUS

## 2013-02-07 MED ORDER — LACTATED RINGERS IV SOLN
INTRAVENOUS | Status: DC
  Administered 2013-02-07: 10:00:00 via INTRAVENOUS

## 2013-02-07 MED ORDER — MIDAZOLAM HCL 5 MG/5ML IJ SOLN
INTRAMUSCULAR | Status: DC | PRN
  Administered 2013-02-07: 1 mg via INTRAVENOUS

## 2013-02-07 MED ORDER — LIDOCAINE HCL (CARDIAC) 20 MG/ML IV SOLN
INTRAVENOUS | Status: DC | PRN
  Administered 2013-02-07: 30 mg via INTRAVENOUS

## 2013-02-07 MED ORDER — ONDANSETRON HCL 4 MG/2ML IJ SOLN: 4.0000 mg | Freq: Four times a day (QID) | INTRAMUSCULAR | Status: DC | PRN

## 2013-02-07 MED ORDER — OXYCODONE HCL 5 MG PO TABS: 5.0000 mg | ORAL_TABLET | Freq: Once | ORAL | Status: DC | PRN

## 2013-02-07 MED ORDER — BUPIVACAINE HCL (PF) 0.25 % IJ SOLN
INTRAMUSCULAR | Status: DC | PRN
  Administered 2013-02-07: 30 mL

## 2013-02-07 MED ORDER — CEFAZOLIN SODIUM-DEXTROSE 2-3 GM-% IV SOLR
INTRAVENOUS | Status: AC
  Filled 2013-02-07: qty 50

## 2013-02-07 MED ORDER — ONDANSETRON HCL 4 MG/2ML IJ SOLN
INTRAMUSCULAR | Status: DC | PRN
  Administered 2013-02-07: 4 mg via INTRAVENOUS

## 2013-02-07 MED ORDER — LACTATED RINGERS IV SOLN: INTRAVENOUS | Status: DC

## 2013-02-07 MED ORDER — MIDAZOLAM HCL 2 MG/2ML IJ SOLN: 1.0000 mg | INTRAMUSCULAR | Status: DC | PRN

## 2013-02-07 MED ORDER — 0.9 % SODIUM CHLORIDE (POUR BTL) OPTIME
TOPICAL | Status: DC | PRN
  Administered 2013-02-07: 100 mL

## 2013-02-07 MED ORDER — SCOPOLAMINE 1 MG/3DAYS TD PT72
1.0000 | MEDICATED_PATCH | Freq: Once | TRANSDERMAL | Status: DC
  Administered 2013-02-07: 1.5 mg via TRANSDERMAL

## 2013-02-07 MED ORDER — LIDOCAINE HCL (PF) 1 % IJ SOLN
INTRAMUSCULAR | Status: DC | PRN
  Administered 2013-02-07: 30 mL

## 2013-02-07 SURGICAL SUPPLY — 44 items
BANDAGE COBAN STERILE 2 (GAUZE/BANDAGES/DRESSINGS) IMPLANT
BLADE MINI RND TIP GREEN BEAV (BLADE) IMPLANT
BLADE SURG 15 STRL LF DISP TIS (BLADE) ×1 IMPLANT
BLADE SURG 15 STRL SS (BLADE) ×3
BNDG CMPR 9X4 STRL LF SNTH (GAUZE/BANDAGES/DRESSINGS) ×1
BNDG COHESIVE 1X5 TAN STRL LF (GAUZE/BANDAGES/DRESSINGS) ×1 IMPLANT
BNDG COHESIVE 3X5 TAN STRL LF (GAUZE/BANDAGES/DRESSINGS) IMPLANT
BNDG COHESIVE 4X5 TAN STRL (GAUZE/BANDAGES/DRESSINGS) ×2 IMPLANT
BNDG CONFORM 2 STRL LF (GAUZE/BANDAGES/DRESSINGS) ×1 IMPLANT
BNDG ESMARK 4X9 LF (GAUZE/BANDAGES/DRESSINGS) ×2 IMPLANT
BNDG GAUZE ELAST 4 BULKY (GAUZE/BANDAGES/DRESSINGS) ×4 IMPLANT
CHLORAPREP W/TINT 26ML (MISCELLANEOUS) ×3 IMPLANT
COVER MAYO STAND STRL (DRAPES) ×3 IMPLANT
COVER TABLE BACK 60X90 (DRAPES) ×3 IMPLANT
CUFF TOURNIQUET SINGLE 18IN (TOURNIQUET CUFF) ×2 IMPLANT
DRAPE EXTREMITY T 121X128X90 (DRAPE) ×3 IMPLANT
DRAPE SURG 17X23 STRL (DRAPES) ×3 IMPLANT
DRSG EMULSION OIL 3X3 NADH (GAUZE/BANDAGES/DRESSINGS) ×3 IMPLANT
GLOVE BIO SURGEON STRL SZ7.5 (GLOVE) ×3 IMPLANT
GLOVE BIOGEL PI IND STRL 6.5 (GLOVE) IMPLANT
GLOVE BIOGEL PI IND STRL 7.0 (GLOVE) IMPLANT
GLOVE BIOGEL PI IND STRL 8 (GLOVE) ×1 IMPLANT
GLOVE BIOGEL PI INDICATOR 6.5 (GLOVE) ×2
GLOVE BIOGEL PI INDICATOR 7.0 (GLOVE) ×2
GLOVE BIOGEL PI INDICATOR 8 (GLOVE) ×2
GLOVE ECLIPSE 6.5 STRL STRAW (GLOVE) ×2 IMPLANT
GOWN STRL REUS W/ TWL LRG LVL3 (GOWN DISPOSABLE) ×1 IMPLANT
GOWN STRL REUS W/TWL LRG LVL3 (GOWN DISPOSABLE) ×6
NDL HYPO 25X1 1.5 SAFETY (NEEDLE) IMPLANT
NDL SAFETY ECLIPSE 18X1.5 (NEEDLE) IMPLANT
NEEDLE HYPO 18GX1.5 SHARP (NEEDLE)
NEEDLE HYPO 25X1 1.5 SAFETY (NEEDLE) ×3 IMPLANT
NS IRRIG 1000ML POUR BTL (IV SOLUTION) ×3 IMPLANT
PACK BASIN DAY SURGERY FS (CUSTOM PROCEDURE TRAY) ×3 IMPLANT
PADDING CAST ABS 4INX4YD NS (CAST SUPPLIES) ×2
PADDING CAST ABS COTTON 4X4 ST (CAST SUPPLIES) IMPLANT
SPONGE GAUZE 4X4 12PLY STER LF (GAUZE/BANDAGES/DRESSINGS) ×3 IMPLANT
STOCKINETTE 4X48 STRL (DRAPES) ×3 IMPLANT
SUT VICRYL RAPIDE 4/0 PS 2 (SUTURE) ×2 IMPLANT
SYR BULB 3OZ (MISCELLANEOUS) ×2 IMPLANT
SYRINGE 10CC LL (SYRINGE) ×2 IMPLANT
TOWEL OR 17X24 6PK STRL BLUE (TOWEL DISPOSABLE) ×3 IMPLANT
TOWEL OR NON WOVEN STRL DISP B (DISPOSABLE) ×1 IMPLANT
UNDERPAD 30X30 INCONTINENT (UNDERPADS AND DIAPERS) ×3 IMPLANT

## 2013-02-07 NOTE — Op Note (Signed)
02/07/2013  9:59 AM  PATIENT:  [name]  58 y.o. female  PRE-OPERATIVE DIAGNOSIS:  Right wrist de Quervain's tenosynovitis  POST-OPERATIVE DIAGNOSIS:  Same  PROCEDURE:  Right wrist first dorsal compartment plasty with debridement of proliferative synovium from APL and EPB tendons  SURGEON: Rayvon Char. Grandville Silos, MD  PHYSICIAN ASSISTANT: None  ANESTHESIA:  local and MAC  SPECIMENS:  None  DRAINS:   None  PREOPERATIVE INDICATIONS:  [name] is a  58 y.o. 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  OPERATIVE IMPLANTS: None  OPERATIVE PROCEDURE:  After receiving prophylactic antibiotics, the patient was escorted to the operative theatre and placed in a supine position.  A surgical "time-out" was performed during which the planned procedure, proposed operative site, and the correct patient identity were compared to the operative consent and agreement confirmed by the circulating nurse according to current facility policy.  The planned site was marked and anesthetized by me with a mixture of lidocaine and Marcaine bearing epinephrine. MAC was provided by anesthesia. Following application of a tourniquet to the operative extremity, the exposed skin was prepped with Chloraprep and draped in the usual sterile fashion.  The limb was exsanguinated with an Esmarch bandage and the tourniquet inflated to approximately 144mmHg higher than systolic BP.  An oblique incision was made sharply over the first dorsal compartment tendon extensor retinaculum. Subcutaneous tissues were dissected with blunt and spreading dissection elevating full-thickness flaps off of the  fascia. Superficial radial nerve branches were observed dorsally and retracted with a full-thickness flap. The first dorsal compartment plasty was performed as a Z-plasty, leaving a distal palmar flap. The remainder of the fascia over the tendons was incised proximally and distally for 2-3 inches. There was a lot of proliferative synovium on both of the tendons at the proximal edge of the very thick portion of retinaculum. This was all debrided off the tendons. There was no separate sub-sheath for the EPB. The tendons were then returned to her bed and the Z-plasty flaps approximated in their lengthened positions, thus again creating restraint for the tendons but with much more space than previously. Tourniquet was released and additional hemostasis obtained with bipolar electrocautery and the wound was closed with 4-0 Vicryl Rapide running horizontal mattress suture. A short arm splint dressing with a volar plaster component was applied and the patient was taken to the recovery room stable condition.  DISPOSITION: The patient will be discharged home today returning in roughly one week to be transitioned to a removable wrist splint and for teaching the skin mobility exercises.

## 2013-02-07 NOTE — Anesthesia Postprocedure Evaluation (Signed)
Anesthesia Post Note  Patient: [name]  Procedure(s) Performed: Procedure(s) (LRB): RIGHT WRIST DEQUERVAIN RELEASE (Right)  Anesthesia type: MAC  Patient location: PACU  Post pain: Pain level controlled and Adequate analgesia  Post assessment: Post-op Vital signs reviewed, Patient's Cardiovascular Status Stable and Respiratory Function Stable  Last Vitals:  Filed Vitals:   02/07/13 1131  BP: 179/79  Pulse: 56  Temp: 36.6 C  Resp: 16    Post vital signs: Reviewed and stable  Level of consciousness: awake, alert  and oriented  Complications: No apparent anesthesia complications

## 2013-02-07 NOTE — Anesthesia Preprocedure Evaluation (Signed)
Anesthesia Evaluation  Patient identified by MRN, date of birth, ID band Patient awake    Reviewed: Allergy & Precautions, H&P , NPO status , Patient's Chart, lab work & pertinent test results  Airway Mallampati: II  Neck ROM: full    Dental   Pulmonary COPDCurrent Smoker,          Cardiovascular negative cardio ROS      Neuro/Psych  Headaches,    GI/Hepatic   Endo/Other    Renal/GU Only one kidney     Musculoskeletal   Abdominal   Peds  Hematology   Anesthesia Other Findings   Reproductive/Obstetrics                           Anesthesia Physical Anesthesia Plan  ASA: II  Anesthesia Plan: General   Post-op Pain Management:    Induction: Intravenous  Airway Management Planned: LMA  Additional Equipment:   Intra-op Plan:   Post-operative Plan:   Informed Consent: I have reviewed the patients History and Physical, chart, labs and discussed the procedure including the risks, benefits and alternatives for the proposed anesthesia with the patient or authorized representative who has indicated his/her understanding and acceptance.     Plan Discussed with: CRNA, Anesthesiologist and Surgeon  Anesthesia Plan Comments:         Anesthesia Quick Evaluation

## 2013-02-07 NOTE — Transfer of Care (Signed)
Immediate Anesthesia Transfer of Care Note  Patient: [name]  Procedure(s) Performed: Procedure(s): RIGHT WRIST DEQUERVAIN RELEASE (Right)  Patient Location: PACU  Anesthesia Type:MAC  Level of Consciousness: awake, alert , oriented and patient cooperative  Airway & Oxygen Therapy: Patient Spontanous Breathing and Patient connected to face mask oxygen  Post-op Assessment: Report given to PACU RN and Post -op Vital signs reviewed and stable  Post vital signs: Reviewed and stable  Complications: No apparent anesthesia complications

## 2013-02-07 NOTE — Discharge Instructions (Addendum)
Discharge Instructions   You have a dressing with a plaster splint incorporated in it. Move your fingers as much as possible, making a full fist and fully opening the fist. Elevate your hand to reduce pain & swelling of the digits.  Ice over the operative site may be helpful to reduce pain & swelling.  DO NOT USE HEAT. Pain medicine has been prescribed for you.  Use your medicine as needed over the first 48 hours, and then you can begin to taper your use.  You may use Tylenol in place of your prescribed pain medication, but not IN ADDITION to it. Leave the dressing in place until you return to our office.  You may shower, but keep the bandage clean & dry.  You may drive a car when you are off of prescription pain medications and can safely control your vehicle with both hands. Call the office today to make an appointment for approx a week.   Please call [phone] during normal business hours or [phone] after hours for any problems. Including the following:  - excessive redness of the incisions - drainage for more than 4 days - fever of more than 101.5 F  *Please note that pain medications will not be refilled after hours or on weekends.   Post Anesthesia Home Care Instructions  Activity: Get plenty of rest for the remainder of the day. A responsible adult should stay with you for 24 hours following the procedure.  For the next 24 hours, DO NOT: -Drive a car -Paediatric nurse -Drink alcoholic beverages -Take any medication unless instructed by your physician -Make any legal decisions or sign important papers.  Meals: Start with liquid foods such as gelatin or soup. Progress to regular foods as tolerated. Avoid greasy, spicy, heavy foods. If nausea and/or vomiting occur, drink only clear liquids until the nausea and/or vomiting subsides. Call your physician if vomiting continues.  Special Instructions/Symptoms: Your throat may feel dry or sore from the anesthesia or the  breathing tube placed in your throat during surgery. If this causes discomfort, gargle with warm salt water. The discomfort should disappear within 24 hours.

## 2013-02-07 NOTE — Interval H&P Note (Signed)
History and Physical Interval Note:  02/07/2013 9:59 AM  [name]  has presented today for surgery, with the diagnosis of right wrist dequervains  The various methods of treatment have been discussed with the patient and family. After consideration of risks, benefits and other options for treatment, the patient has consented to  Procedure(s): RIGHT WRIST DEQUERVAIN RELEASE (Right) as a surgical intervention .  The patient's history has been reviewed, patient examined, no change in status, stable for surgery.  I have reviewed the patient's chart and labs.  Questions were answered to the patient's satisfaction.     Jadie Comas A.

## 2013-02-07 NOTE — Anesthesia Procedure Notes (Signed)
Procedure Name: MAC Date/Time: 02/07/2013 10:19 AM Performed by: Kimeka Badour Pre-anesthesia Checklist: Patient identified, Emergency Drugs available, Suction available, Patient being monitored and Timeout performed Patient Re-evaluated:Patient Re-evaluated prior to inductionOxygen Delivery Method: Simple face mask

## 2013-02-09 ENCOUNTER — Encounter (HOSPITAL_BASED_OUTPATIENT_CLINIC_OR_DEPARTMENT_OTHER): Payer: Self-pay | Admitting: Orthopedic Surgery

## 2013-05-12 ENCOUNTER — Encounter (HOSPITAL_COMMUNITY): Payer: Self-pay | Admitting: Emergency Medicine

## 2013-05-12 ENCOUNTER — Emergency Department (HOSPITAL_COMMUNITY)
Admission: EM | Admit: 2013-05-12 | Discharge: 2013-05-13 | Disposition: A | Discharge status: Home / Self Care | Payer: 59 | Attending: Emergency Medicine | Admitting: Emergency Medicine

## 2013-05-12 ENCOUNTER — Emergency Department (HOSPITAL_COMMUNITY): Payer: 59

## 2013-05-12 DIAGNOSIS — Z79899 Other long term (current) drug therapy: Secondary | ICD-10-CM | POA: Insufficient documentation

## 2013-05-12 DIAGNOSIS — Z791 Long term (current) use of non-steroidal anti-inflammatories (NSAID): Secondary | ICD-10-CM | POA: Diagnosis not present

## 2013-05-12 DIAGNOSIS — S61209A Unspecified open wound of unspecified finger without damage to nail, initial encounter: Secondary | ICD-10-CM | POA: Insufficient documentation

## 2013-05-12 DIAGNOSIS — Y93G1 Activity, food preparation and clean up: Secondary | ICD-10-CM | POA: Insufficient documentation

## 2013-05-12 DIAGNOSIS — W268XXA Contact with other sharp object(s), not elsewhere classified, initial encounter: Secondary | ICD-10-CM | POA: Diagnosis not present

## 2013-05-12 DIAGNOSIS — Z905 Acquired absence of kidney: Secondary | ICD-10-CM | POA: Diagnosis not present

## 2013-05-12 DIAGNOSIS — F17200 Nicotine dependence, unspecified, uncomplicated: Secondary | ICD-10-CM | POA: Insufficient documentation

## 2013-05-12 DIAGNOSIS — Y929 Unspecified place or not applicable: Secondary | ICD-10-CM | POA: Diagnosis not present

## 2013-05-12 DIAGNOSIS — S61219A Laceration without foreign body of unspecified finger without damage to nail, initial encounter: Secondary | ICD-10-CM

## 2013-05-12 NOTE — Discharge Instructions (Signed)
Laceration Care, Adult °A laceration is a cut that goes through all layers of the skin. The cut goes into the tissue beneath the skin. °HOME CARE °For stitches (sutures) or staples: °· Keep the cut clean and dry. °· If you have a bandage (dressing), change it at least once a day. Change the bandage if it gets wet or dirty, or as told by your doctor. °· Wash the cut with soap and water 2 times a day. Rinse the cut with water. Pat it dry with a clean towel. °· Put a thin layer of medicated cream on the cut as told by your doctor. °· You may shower after the first 24 hours. Do not soak the cut in water until the stitches are removed. °· Only take medicines as told by your doctor. °· Have your stitches or staples removed as told by your doctor. °For skin adhesive strips: °· Keep the cut clean and dry. °· Do not get the strips wet. You may take a bath, but be careful to keep the cut dry. °· If the cut gets wet, pat it dry with a clean towel. °· The strips will fall off on their own. Do not remove the strips that are still stuck to the cut. °For wound glue: °· You may shower or take baths. Do not soak or scrub the cut. Do not swim. Avoid heavy sweating until the glue falls off on its own. After a shower or bath, pat the cut dry with a clean towel. °· Do not put medicine on your cut until the glue falls off. °· If you have a bandage, do not put tape over the glue. °· Avoid lots of sunlight or tanning lamps until the glue falls off. Put sunscreen on the cut for the first year to reduce your scar. °· The glue will fall off on its own. Do not pick at the glue. °You may need a tetanus shot if: °· You cannot remember when you had your last tetanus shot. °· You have never had a tetanus shot. °If you need a tetanus shot and you choose not to have one, you may get tetanus. Sickness from tetanus can be serious. °GET HELP RIGHT AWAY IF:  °· Your pain does not get better with medicine. °· Your arm, hand, leg, or foot loses feeling  (numbness) or changes color. °· Your cut is bleeding. °· Your joint feels weak, or you cannot use your joint. °· You have painful lumps on your body. °· Your cut is red, puffy (swollen), or painful. °· You have a red line on the skin near the cut. °· You have yellowish-white fluid (pus) coming from the cut. °· You have a fever. °· You have a bad smell coming from the cut or bandage. °· Your cut breaks open before or after stitches are removed. °· You notice something coming out of the cut, such as wood or glass. °· You cannot move a finger or toe. °MAKE SURE YOU:  °· Understand these instructions. °· Will watch your condition. °· Will get help right away if you are not doing well or get worse. °Document Released: 06/11/2007 Document Revised: 03/17/2011 Document Reviewed: 06/18/2010 °[employer]® Patient Information ©2014 [employer]. ° ° ° ° °

## 2013-05-12 NOTE — ED Notes (Addendum)
Pt states she cut her finger on a steel cup as she was washing it. No bleeding at this time. Pt thinks she had a tetanus shot 5 years ago when she did a kidney transplant.

## 2013-05-12 NOTE — ED Notes (Signed)
Patient reports that she cut right index finger on coffee cup tonight. Laceration noted. Patient reports painful on palpation to laceration, but also reports she has no sensation to tip of finger. No active bleeding at this time.

## 2013-05-13 NOTE — ED Notes (Signed)
Pt alert & oriented x4, stable gait. Patient given discharge instructions, paperwork & prescription(s). Patient  instructed to stop at the registration desk to finish any additional paperwork. Patient verbalized understanding. Pt left department w/ no further questions. 

## 2013-05-15 NOTE — ED Provider Notes (Signed)
CSN: [id number]     Arrival date & time 05/12/13  2139 History   First MD Initiated Contact with Patient 05/12/13 2350     Chief Complaint  Patient presents with  . Extremity Laceration     (Consider location/radiation/quality/duration/timing/severity/associated sxs/prior Treatment) Patient is a 58 y.o. female presenting with skin laceration. The history is provided by the patient.  Laceration Location:  Finger Finger laceration location:  R index finger Length (cm):  1 Depth:  Cutaneous Quality: straight   Bleeding: controlled   Time since incident:  1 hour Laceration mechanism:  Metal edge Pain details:    Quality:  Aching, tingling and numbness   Severity:  Mild   Timing:  Constant   Progression:  Unchanged Foreign body present:  No foreign bodies Relieved by:  Nothing Worsened by:  Nothing tried Tetanus status:  Up to date  Patient reports laceration to the distal and of the left index finger that occurred while watching a metal cup. She complains of numbness and tingling sensation to her distal finger, she also states that she has been squeezing her finger and applying pressure for more than 1 hour. She denies foreign body. She also denies use of anticoagulants  Past Medical History  Diagnosis Date  . Wears dentures     top  . Wears glasses   . 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 Neck surgery  1990    rt ? parotidectomy  . Bladder surgery      bladder tac  . Bowel resection    . Abdominal hysterectomy  1991    partial  . Neck mass excision      rt-mass  . Dorsal compartment release Right 02/07/2013    Procedure: RIGHT WRIST DEQUERVAIN RELEASE;  Surgeon: Jolyn Nap, MD;  Location: [facility];  Service: Orthopedics;  Laterality: Right;   History reviewed. No pertinent family history. History  Substance Use  Topics  . Smoking status: Current Every Day Smoker -- 0.30 packs/day  . Smokeless tobacco: Never Used  . Alcohol Use: No   OB History   Grav Para Term Preterm Abortions TAB SAB Ect Mult Living                 Review of Systems  Constitutional: Negative for fever and chills.  Musculoskeletal: Negative for arthralgias, back pain and joint swelling.  Skin: Positive for wound.       Laceration   Neurological: Negative for dizziness, weakness and numbness.  Hematological: Does not bruise/bleed easily.  All other systems reviewed and are negative.     Allergies  Ibuprofen; Lactose intolerance (gi); Nsaids; Other; Sudafed; and Sulfonamide derivatives  Home Medications   Prior to Admission medications   Medication Sig Start Date End Date Taking? Authorizing Provider  acetaminophen (TYLENOL) 325 MG tablet Take 1 tablet (325 mg total) by mouth every 6 (six) hours as needed for pain. 07/19/12   Marissa Sciacca, PA-C  naproxen sodium (ANAPROX) 220 MG tablet Take 220 mg by mouth 2 (two) times daily with a meal.    Historical Provider, MD  oxyCODONE-acetaminophen (ROXICET) 5-325 MG per tablet Take 1-2 tablets by mouth every 4 (four) hours as needed for moderate pain (max 6 daily beginning 02-11-13). 02/07/13   Jolyn Nap, MD   BP 174/90  Pulse 65  Temp(Src) 97.6 F (36.4 C) (Oral)  Resp 18  Ht 5\' 7"  (1.702 m)  Wt 122 lb (55.339 kg)  BMI 19.10 kg/m2  SpO2 100% Physical Exam  Nursing note and vitals reviewed. Constitutional: She is oriented to person, place, and time. She appears well-developed and well-nourished. No distress.  HENT:  Head: Normocephalic and atraumatic.  Cardiovascular: Normal rate, regular rhythm, normal heart sounds and intact distal pulses.   No murmur heard. Pulmonary/Chest: Effort normal and breath sounds normal. No respiratory distress.  Musculoskeletal: Normal range of motion. She exhibits no edema.  Neurological: She is alert and oriented to person, place,  and time. She exhibits normal muscle tone. Coordination normal.  Skin: Skin is warm. Laceration noted.  1 cm Laceration to the distal left index finger. Laceration appears very superficial, bleeding is controlled, gross sensation to the finger is intact. Cap refill is less than 2 seconds, radial pulse is brisk, no edema. Rash has full range of motion of the finger. No proximal tenderness    ED Course  Procedures (including critical care time) Labs Review Labs Reviewed - No data to display  Imaging Review Dg Finger Index Left  05/12/2013   CLINICAL DATA:  Laceration of the left index finger  EXAM: LEFT INDEX FINGER 2+V  COMPARISON:  None.  FINDINGS: There is no acute fracture or dislocation. There is no radiopaque foreign body or subcutaneous emphysema.  IMPRESSION: No acute osseous injury of the left index finger.   Electronically Signed   By: Kathreen Devoid   On: 05/12/2013 23:23    EKG Interpretation None      MDM   Final diagnoses:  Finger laceration    Patient is well appearing. Small superficial laceration to the distal left index finger that does not appear to need suturing.  Steri-Strip was applied with adequate wound closure.   no evidence of foreign bodies. Bleeding is controlled. Paresthesias of the distal finger is likely related to compression of the finger by the patient to control bleeding although I have explained to the patient a nerve injury may also be possible.  She agrees to wound care and to keep the finger bandaged to return to the ER for any worsening symptoms. She appears stable for discharge.   Mckenzey Parcell L. Malcolm Quast, PA-C 05/15/13 2992

## 2013-05-15 NOTE — ED Provider Notes (Signed)
Medical screening examination/treatment/procedure(s) were performed by non-physician practitioner and as supervising physician I was immediately available for consultation/collaboration.   Delora Fuel, MD 01/74/94 [id number]

## 2013-08-07 ENCOUNTER — Encounter (HOSPITAL_COMMUNITY): Payer: Self-pay | Admitting: Emergency Medicine

## 2013-08-07 ENCOUNTER — Emergency Department (HOSPITAL_COMMUNITY): Payer: 59

## 2013-08-07 ENCOUNTER — Emergency Department (HOSPITAL_COMMUNITY)
Admission: EM | Admit: 2013-08-07 | Discharge: 2013-08-07 | Disposition: A | Discharge status: Home / Self Care | Payer: 59 | Attending: Emergency Medicine | Admitting: Emergency Medicine

## 2013-08-07 DIAGNOSIS — Z791 Long term (current) use of non-steroidal anti-inflammatories (NSAID): Secondary | ICD-10-CM | POA: Diagnosis not present

## 2013-08-07 DIAGNOSIS — I159 Secondary hypertension, unspecified: Secondary | ICD-10-CM

## 2013-08-07 DIAGNOSIS — M79602 Pain in left arm: Secondary | ICD-10-CM

## 2013-08-07 DIAGNOSIS — Z79899 Other long term (current) drug therapy: Secondary | ICD-10-CM | POA: Insufficient documentation

## 2013-08-07 DIAGNOSIS — M79609 Pain in unspecified limb: Secondary | ICD-10-CM | POA: Insufficient documentation

## 2013-08-07 DIAGNOSIS — F17200 Nicotine dependence, unspecified, uncomplicated: Secondary | ICD-10-CM | POA: Diagnosis not present

## 2013-08-07 DIAGNOSIS — E139 Other specified diabetes mellitus without complications: Secondary | ICD-10-CM | POA: Diagnosis not present

## 2013-08-07 DIAGNOSIS — M79601 Pain in right arm: Secondary | ICD-10-CM

## 2013-08-07 LAB — I-STAT CHEM 8, ED
BUN: 19 mg/dL (ref 6–23)
CHLORIDE: 105 meq/L (ref 96–112)
CREATININE: 1 mg/dL (ref 0.50–1.10)
Calcium, Ion: 1.27 mmol/L — ABNORMAL HIGH (ref 1.12–1.23)
Glucose, Bld: 100 mg/dL — ABNORMAL HIGH (ref 70–99)
HCT: 42 % (ref 36.0–46.0)
Hemoglobin: 14.3 g/dL (ref 12.0–15.0)
Potassium: 4.2 mEq/L (ref 3.7–5.3)
SODIUM: 141 meq/L (ref 137–147)
TCO2: 28 mmol/L (ref 0–100)

## 2013-08-07 MED ORDER — HYDROMORPHONE HCL PF 1 MG/ML IJ SOLN
0.5000 mg | Freq: Once | INTRAMUSCULAR | Status: AC
  Administered 2013-08-07: 0.5 mg via INTRAMUSCULAR
  Filled 2013-08-07: qty 1

## 2013-08-07 MED ORDER — ONDANSETRON 8 MG PO TBDP
8.0000 mg | ORAL_TABLET | Freq: Once | ORAL | Status: AC
  Administered 2013-08-07: 8 mg via ORAL
  Filled 2013-08-07: qty 1

## 2013-08-07 NOTE — Discharge Instructions (Signed)
°  SEEK IMMEDIATE MEDICAL ATTENTION IF: You develop difficulties swallowing or breathing.  You have new or worse numbness, weakness, tingling, or movement problems in your arms or legs.  You develop increasing pain which is uncontrolled with medications.  You have change in bowel or bladder function, or other concerns.  

## 2013-08-07 NOTE — ED Notes (Signed)
MD at bedside. 

## 2013-08-07 NOTE — ED Provider Notes (Signed)
CSN: [id number]     Arrival date & time 08/07/13  0431 History   First MD Initiated Contact with Patient 08/07/13 [phone]     Chief Complaint  Patient presents with  . Arm Pain       Patient is a 58 y.o. female presenting with arm pain. The history is provided by the patient.  Arm Pain Pertinent negatives include no chest pain, no headaches and no shortness of breath.   Patient presents for onset of bilateral arm/hand pain that woke her up from sleep.  She reports before going to bed, she felt well, but woke up with this intense pain.  It is worsening.  Nothing improves her pain. She denies trauma No weakness to extremities reported.  She reports she feels "tingling" in her fingers No new neck pain No anterior chest pain She does report pain to her left scapula She reports repetitive work at her job and this will sometimes trigger pain She reports similar type pain in right arm previously but is usually not bilateral    Past Medical History  Diagnosis Date  . Wears dentures     top  . Wears glasses   . 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 Neck surgery  1990    rt ? parotidectomy  . Bladder surgery      bladder tac  . Bowel resection    . Abdominal hysterectomy  1991    partial  . Neck mass excision      rt-mass  . Dorsal compartment release Right 02/07/2013    Procedure: RIGHT WRIST DEQUERVAIN RELEASE;  Surgeon: Jolyn Nap, MD;  Location: [facility];  Service: Orthopedics;  Laterality: Right;   History reviewed. No pertinent family history. History  Substance Use Topics  . Smoking status: Current Every Day Smoker -- 0.30 packs/day  . Smokeless tobacco: Never Used  . Alcohol Use: No   OB History   Grav Para Term Preterm Abortions TAB SAB Ect Mult Living                 Review of Systems  Constitutional: Negative  for fever.  Respiratory: Negative for shortness of breath.   Cardiovascular: Negative for chest pain.  Gastrointestinal: Negative for vomiting.  Musculoskeletal: Negative for back pain and neck pain.  Neurological: Positive for numbness. Negative for headaches.  All other systems reviewed and are negative.     Allergies  Ibuprofen; Lactose intolerance (gi); Nsaids; Other; Sudafed; and Sulfonamide derivatives  Home Medications   Prior to Admission medications   Medication Sig Start Date End Date Taking? Authorizing Provider  acetaminophen (TYLENOL) 325 MG tablet Take 1 tablet (325 mg total) by mouth every 6 (six) hours as needed for pain. 07/19/12   Marissa Sciacca, PA-C  naproxen sodium (ANAPROX) 220 MG tablet Take 220 mg by mouth 2 (two) times daily with a meal.    Historical Provider, MD  oxyCODONE-acetaminophen (ROXICET) 5-325 MG per tablet Take 1-2 tablets by mouth every 4 (four) hours as needed for moderate pain (max 6 daily beginning 02-11-13). 02/07/13   Jolyn Nap, MD   BP 184/103  Pulse 56  Temp(Src) 98.6 F (37 C) (Oral)  Resp 18  Ht 5\' 5"  (1.651 m)  Wt 122 lb (55.339 kg)  BMI 20.30 kg/m2  SpO2  100% Physical Exam CONSTITUTIONAL: Well developed/well nourished HEAD: Normocephalic/atraumatic EYES: EOMI/PERRL ENMT: Mucous membranes moist NECK: supple no meningeal signs SPINE:entire spine nontender. Mild thoracic paraspinal tenderness noted CV: S1/S2 noted, no murmurs/rubs/gallops noted LUNGS: Lungs are clear to auscultation bilaterally, no apparent distress ABDOMEN: soft, nontender, no rebound or guarding GU:no cva tenderness NEURO: Pt is awake/alert, moves all extremitiesx4 Equal power (5/5) with hand grip, wrist flex/extension, elbow flex/extension, and equal power with shoulder abduction/adduction.  No focal sensory deficit to light touch is noted in either UE.   Equal (2+) biceps/brachioradialis/tricep reflex in bilateral UE EXTREMITIES: pulses normal, full  ROM. Bilateral/equal radial pulses.   SKIN: warm, color normal PSYCH: anxious  ED Course  Procedures  Pt presents for acute bilateral arm pain She has no signs of trauma.  No neurologic deficit.  No vascular deficit She reports similar type pain in hands due to right tenosynovitis but now bilateral Also has h/o cervical radiculopathy though denies current neck pain Pt does not report h/o HTN but previous ED visits reveal all with hypertension  Will obtain CXR due to upper back pain.   Otherwise, I doubt other acute neurologic or cardiopulmonary process at this time 8:01 AM Pt improved Most of her pain has resolved We discussed strict return precautions  Labs Review Labs Reviewed  I-STAT CHEM 8, ED - Abnormal; Notable for the following:    Glucose, Bld 100 (*)    Calcium, Ion 1.27 (*)    All other components within normal limits    Imaging Review Dg Chest 2 View  08/07/2013   CLINICAL DATA:  Bilateral back and arm pain.  Hypertension.  EXAM: CHEST  2 VIEW  COMPARISON:  Prior chest x-ray 10/15/2006  FINDINGS: Cardiac and mediastinal contours are within normal limits. The lungs are clear. There is mild pulmonary hyperinflation with flattening of the diaphragms and increased retrosternal clear space. Mild central bronchitic changes similar compared to prior. No suspicious pulmonary nodule. No pleural effusion or pneumothorax. No acute osseous abnormality.  IMPRESSION: No active cardiopulmonary disease.  Suspect COPD.   Electronically Signed   By: Jacqulynn Cadet M.D.   On: 08/07/2013 07:48      MDM   Final diagnoses:  Bilateral arm pain  Secondary hypertension, unspecified    Nursing notes including past medical history and social history reviewed and considered in documentation Labs/vital reviewed and considered Previous records reviewed and considered     Sharyon Cable, MD 08/07/13 [phone]

## 2013-08-07 NOTE — ED Notes (Signed)
Pt reporting pain in both arms that woke her up.  Also reporting some tingling in fingers.

## 2013-08-09 ENCOUNTER — Encounter: Payer: Self-pay | Admitting: *Deleted

## 2013-08-09 ENCOUNTER — Ambulatory Visit (INDEPENDENT_AMBULATORY_CARE_PROVIDER_SITE_OTHER): Payer: 59 | Admitting: Nurse Practitioner

## 2013-08-09 ENCOUNTER — Encounter: Payer: Self-pay | Admitting: Nurse Practitioner

## 2013-08-09 VITALS — BP 110/80 | HR 55 | Temp 97.7°F | Ht 66.5 in | Wt 114.5 lb

## 2013-08-09 DIAGNOSIS — M79609 Pain in unspecified limb: Secondary | ICD-10-CM | POA: Diagnosis not present

## 2013-08-09 DIAGNOSIS — I498 Other specified cardiac arrhythmias: Secondary | ICD-10-CM | POA: Diagnosis not present

## 2013-08-09 DIAGNOSIS — R001 Bradycardia, unspecified: Secondary | ICD-10-CM

## 2013-08-09 DIAGNOSIS — R209 Unspecified disturbances of skin sensation: Secondary | ICD-10-CM | POA: Diagnosis not present

## 2013-08-09 DIAGNOSIS — F17200 Nicotine dependence, unspecified, uncomplicated: Secondary | ICD-10-CM | POA: Diagnosis not present

## 2013-08-09 DIAGNOSIS — M79602 Pain in left arm: Principal | ICD-10-CM

## 2013-08-09 DIAGNOSIS — R202 Paresthesia of skin: Secondary | ICD-10-CM | POA: Insufficient documentation

## 2013-08-09 DIAGNOSIS — Z905 Acquired absence of kidney: Secondary | ICD-10-CM

## 2013-08-09 DIAGNOSIS — M79601 Pain in right arm: Secondary | ICD-10-CM | POA: Insufficient documentation

## 2013-08-09 MED ORDER — ASPIRIN 81 MG PO TBEC: 81.0000 mg | DELAYED_RELEASE_TABLET | Freq: Every day | ORAL | Status: DC

## 2013-08-09 NOTE — Patient Instructions (Addendum)
Start 81 mg aspirin daily enteric coated for stoke prevention.  Start Vitamin D3 daily 1000 iu.  Prolonged standing & staying in basically same position all day can cause neck & shoulder tension. This may be contributing to hand numbness. Every hour, you should stretch neck & shoulders.   Arm pain couId be related to arthritis in neck, but I am concerned that it may be heart related. I have referred you to cardiology for further evaluation of slow heart rate and episodes of bilateral arm pain.  My office will call with lab results.   Nice to meet you.

## 2013-08-09 NOTE — Progress Notes (Signed)
Pre visit review using our clinic review tool, if applicable. No additional management support is needed unless otherwise documented below in the visit note. 

## 2013-08-10 ENCOUNTER — Encounter: Payer: Self-pay | Admitting: *Deleted

## 2013-08-10 ENCOUNTER — Encounter: Payer: Self-pay | Admitting: Nurse Practitioner

## 2013-08-10 ENCOUNTER — Ambulatory Visit (INDEPENDENT_AMBULATORY_CARE_PROVIDER_SITE_OTHER): Payer: 59 | Admitting: Cardiovascular Disease

## 2013-08-10 ENCOUNTER — Encounter: Payer: Self-pay | Admitting: Cardiovascular Disease

## 2013-08-10 VITALS — BP 120/72 | HR 66 | Ht 67.0 in | Wt 114.0 lb

## 2013-08-10 DIAGNOSIS — I10 Essential (primary) hypertension: Secondary | ICD-10-CM

## 2013-08-10 DIAGNOSIS — M79609 Pain in unspecified limb: Secondary | ICD-10-CM

## 2013-08-10 DIAGNOSIS — R079 Chest pain, unspecified: Secondary | ICD-10-CM | POA: Diagnosis not present

## 2013-08-10 DIAGNOSIS — R42 Dizziness and giddiness: Secondary | ICD-10-CM

## 2013-08-10 DIAGNOSIS — I498 Other specified cardiac arrhythmias: Secondary | ICD-10-CM

## 2013-08-10 DIAGNOSIS — R001 Bradycardia, unspecified: Secondary | ICD-10-CM | POA: Insufficient documentation

## 2013-08-10 DIAGNOSIS — M79602 Pain in left arm: Secondary | ICD-10-CM

## 2013-08-10 DIAGNOSIS — M79601 Pain in right arm: Secondary | ICD-10-CM

## 2013-08-10 DIAGNOSIS — Z905 Acquired absence of kidney: Secondary | ICD-10-CM | POA: Insufficient documentation

## 2013-08-10 LAB — URINALYSIS, ROUTINE W REFLEX MICROSCOPIC
Bilirubin Urine: NEGATIVE
Hgb urine dipstick: NEGATIVE
Ketones, ur: NEGATIVE
LEUKOCYTES UA: NEGATIVE
NITRITE: NEGATIVE
PH: 6.5 (ref 5.0–8.0)
SPECIFIC GRAVITY, URINE: 1.01 (ref 1.000–1.030)
Total Protein, Urine: NEGATIVE
Urine Glucose: NEGATIVE
Urobilinogen, UA: 0.2 (ref 0.0–1.0)

## 2013-08-10 LAB — CBC
HEMATOCRIT: 44.1 % (ref 36.0–46.0)
HEMOGLOBIN: 15 g/dL (ref 12.0–15.0)
MCHC: 34.1 g/dL (ref 30.0–36.0)
MCV: 91.7 fl (ref 78.0–100.0)
PLATELETS: 161 10*3/uL (ref 150.0–400.0)
RBC: 4.81 Mil/uL (ref 3.87–5.11)
RDW: 12.3 % (ref 11.5–15.5)
WBC: 5 10*3/uL (ref 4.0–10.5)

## 2013-08-10 LAB — VITAMIN B12: VITAMIN B 12: 262 pg/mL (ref 211–911)

## 2013-08-10 LAB — THYROID PEROXIDASE ANTIBODY: Thyroperoxidase Ab SerPl-aCnc: 27.1 IU/mL (ref <35.0)

## 2013-08-10 LAB — TSH: TSH: 0.06 u[IU]/mL — ABNORMAL LOW (ref 0.35–4.50)

## 2013-08-10 LAB — T4, FREE: FREE T4: 1.29 ng/dL (ref 0.60–1.60)

## 2013-08-10 NOTE — Progress Notes (Signed)
Subjective:     [name] is a 58 y.o. female and is here to establish care. The patient reports problems - episodes of bilateral arm pain.  Episode occurred a few days ago, waking her from sleep. Pain was severe enough to go to ER. Pt states she has had similar episodes in past, self-limited & w/less intensity. Past episodes occurred when walking into work from parking lot. She denies nausea, dizziness, diaphoresis. She also c/o numbness in all fingertips.  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  She has been a smoker for 40+ years.  History   Social History  . Marital Status: Single    Spouse Name: N/A    Number of Children: 5  . Years of Education: N/A   Occupational History  .      assembly line   Social History Main Topics  . Smoking status: Current Every Day Smoker -- 0.50 packs/day for 40 years  . Smokeless tobacco: Never Used  . Alcohol Use: No  . Drug Use: No  . Sexual Activity: Not on file   Other Topics Concern  . 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 She has 45 grown children.    Health Maintenance  Topic Date Due  . Influenza Vaccine  08/06/2013  . Mammogram  08/11/2014 (Originally 08/14/2004)  . Pap Smear  08/11/2018 (Originally 08/14/1972)  . Tetanus/tdap  01/06/2018  . Colonoscopy  08/11/2018    The following portions of the patient's history were reviewed and updated as appropriate: allergies, current medications, past family history, past medical history, past social history, past surgical history and problem list.  Review of Systems Pertinent items are noted in HPI.   Objective:    BP 110/80  Pulse 55  Temp(Src) 97.7 F (36.5 C) (Oral)  Ht 5' 6.5" (1.689 m)  Wt 114 lb 8 oz (51.937 kg)  BMI 18.21 kg/m2  SpO2 99% General appearance: alert, cooperative, appears stated age and no distress Head: Normocephalic, without  obvious abnormality, atraumatic Eyes: negative findings: lids and lashes normal, conjunctivae and sclerae normal, corneas clear and pupils equal, round, reactive to light and accomodation Ears: normal TM's and external ear canals both ears Throat: lips, mucosa, and tongue normal; teeth and gums normal and upper dentures in place Back: symmetric, no curvature. ROM normal. No CVA tenderness. Lungs: clear to auscultation bilaterally Breasts: normal appearance, no masses or tenderness Heart: regular rate and rhythm, S1, S2 normal, no murmur, click, rub or gallop Abdomen: soft, non-tender; bowel sounds normal; no masses,  no organomegaly Extremities: extremities normal, atraumatic, no cyanosis or edema Pulses: 2+ and symmetric Skin: Skin color, texture, turgor normal. No rashes or lesions Lymph nodes: Cervical, supraclavicular, and axillary nodes normal.    Assessment:   1. Bilateral arm pain - EKG 12-Lead - CBC - T4, free - TSH - Thyroid peroxidase antibody - Vitamin B12 - Ambulatory referral to Cardiology  2. Smoker - aspirin 81 MG EC tablet; Take 1 tablet (81 mg total) by mouth daily. Swallow whole.  Dispense: 30 tablet; Refill: 12 -ua 3. Paresthesia of both hands B12, TSH  4. Bradycardia with 41 - 50 beats per minute - Ambulatory referral to Cardiology  5. History of nephrectomy  See problem list for complete A&P See pt instructions.  CMET

## 2013-08-10 NOTE — Assessment & Plan Note (Signed)
Pt gives Hx of intermittent episodes of bilateral arm pain for several years. Most recent episode woke her from sleep, more intense than before. Went to ER. BP elevated. Bradycardia. No ECG. Dilaudid did not relieve pain. ECG: sinus brady. CVD risk factors: smoker Referral to cardio for eval & Tx.

## 2013-08-10 NOTE — Assessment & Plan Note (Signed)
Numbness in fingertips for several mos. DD: arthritis in neck, B12 deficiency, peripheral neuropathy B12 today Shoulder & neck stretches frequently throughout workday. F/u 4 weeks.

## 2013-08-10 NOTE — Progress Notes (Signed)
Patient ID: [name], female   DOB: July 14, 1955, 58 y.o.   MRN: [id number]       CARDIOLOGY CONSULT NOTE  Patient ID: [name] MRN: [id number] DOB/AGE: 15-Jul-1955 58 y.o.  Admit date: (Not on file) Primary Physician WEAVER, Allen Kell, NP  Reason for Consultation: chest pain, arm pain, bradycardia  HPI: The patient is a 58 year old woman who has been referred for the evaluation of bilateral arm pain and bradycardia in the setting of a long history of tobacco use. An ECG from yesterday showed marked sinus bradycardia, heart rate 42 beats per minute, incomplete right bundle branch block with a QRS duration of 108 ms. She was evaluated in the ED 3 days ago for bilateral arm pain which awoke her from sleep. There was no associated chest pain, shortness of breath, lightheadedness, orthopnea, leg swelling nor paroxysmal nocturnal dyspnea. A chest x-ray was normal. TSH and free T4 along with CBC were ordered yesterday and results are pending.  Heart rate is 66 beats per minute today. She has had bilateral arm pain which has awoken her from sleep followed by spikes in blood pressure. When this occurs, she take 0.1 mg of her daughter's clonidine. She previously donated a kidney to her daughter. During my evaluation, she complained of some mild chest discomfort but said this is unusual for her. This promptly resolved and she said this can occur when she gets anxious. 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 She does say she has been having a lot of imbalance issues but denies falling and syncopal episodes. She has to hold herself against a wall to sustain her balance. She does admit to a history of anxiety.   Allergies  Allergen Reactions  . Ibuprofen     Not supposed to take everyday due to only having one  kidney  . Lactose Intolerance (Gi) Diarrhea    cramping  . 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 Other Nausea And Vomiting    Most pain meds and antibiotics  . Sudafed [Pseudoephedrine Hcl]     Makes "high"  . Sulfonamide Derivatives Rash    Current Outpatient Prescriptions  Medication Sig Dispense Refill  . aspirin 81 MG EC tablet Take 1 tablet (81 mg total) by mouth daily. Swallow whole.  30 tablet  12  . Cholecalciferol (VITAMIN D-3 PO) Take 1,000 Int'l Units/day by mouth.       No current facility-administered medications for this visit.    Past Medical History  Diagnosis Date  . Wears dentures     top  . Wears glasses   . 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 Arthritis   . Allergy   . Migraines     Past Surgical History  Procedure Laterality Date  . 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 Neck surgery  1990    rt ? parotidectomy  . Bladder surgery      bladder tac  . Bowel resection    . Abdominal hysterectomy  1991    partial  . Neck mass excision      rt-mass  . Dorsal compartment release Right 02/07/2013    Procedure: RIGHT WRIST DEQUERVAIN RELEASE;  Surgeon: Shanon Brow  Danella Maiers, MD;  Location: [facility];  Service: Orthopedics;  Laterality: Right;    History   Social History  . Marital Status: Single    Spouse Name: N/A    Number of Children: N/A  . Years of Education: N/A   Occupational History  . Not on file.   Social History Main Topics  . Smoking status: Current Every Day Smoker -- 0.30 packs/day  . Smokeless tobacco: Never Used  . Alcohol Use: No  . Drug Use: No  . Sexual Activity: Not on file   Other Topics Concern  . Not on file   Social History Narrative  . No narrative on file     No family history of premature CAD in 1st degree relatives.  Prior to Admission medications   Medication Sig Start Date End Date Taking? Authorizing Provider  aspirin 81 MG EC tablet  Take 1 tablet (81 mg total) by mouth daily. Swallow whole. 08/09/13  Yes Irene Pap, NP  Cholecalciferol (VITAMIN D-3 PO) Take 1,000 Int'l Units/day by mouth.   Yes Historical Provider, MD     Review of systems complete and found to be negative unless listed above in HPI     Physical exam Blood pressure 120/72, pulse 66, height 5\' 7"  (1.702 m), weight 114 lb (51.71 kg), SpO2 99.00%. General: NAD Neck: No JVD, no thyromegaly or thyroid nodule.  Lungs: Clear to auscultation bilaterally with normal respiratory effort. CV: Nondisplaced PMI. Regular rate and rhythm, normal S1/S2, no S3/S4, no murmur.  No peripheral edema.  No carotid bruit.  Normal pedal pulses.  Abdomen: Soft, nontender, no hepatosplenomegaly, no distention.  Skin: Intact without lesions or rashes.  Neurologic: Alert and oriented x 3.  Psych: Normal affect. Extremities: No clubbing or cyanosis.  HEENT: Normal.   Labs:   Lab Results  Component Value Date   WBC 5.9 08/16/2011   HGB 14.3 08/07/2013   HCT 42.0 08/07/2013   MCV 90.6 08/16/2011   PLT 177 08/16/2011    Recent Labs Lab 08/07/13 0650  NA 141  K 4.2  CL 105  BUN 19  CREATININE 1.00  GLUCOSE 100*   No results found for this basename: CKTOTAL, CKMB, CKMBINDEX, TROPONINI    No results found for this basename: CHOL   No results found for this basename: HDL   No results found for this basename: LDLCALC   No results found for this basename: TRIG   No results found for this basename: CHOLHDL   No results found for this basename: LDLDIRECT         Studies: No results found.  ASSESSMENT AND PLAN:  1. Bradycardia and imbalance: She has been having more problems with imbalance and near falls lately. TSH and free T4 are pending. I will obtain a one-week event monitor to see if there is any correlation between bradyarrhythmias and problems with balance.  2. Bilateral arm pain associated with hypertensive urgency and infrequent chest pains: Her  symptoms are somewhat atypical for ischemic heart disease. However, given her history of tobacco abuse and episodic hypertension, I will obtain an echocardiogram to evaluate for structural heart disease as well as a stress echo to evaluate for ischemia.  Dispo: f/u 3-4 weeks.  Signed: Kate Sable, M.D., F.A.C.C.  08/10/2013, 8:28 AM

## 2013-08-10 NOTE — Patient Instructions (Signed)
Your physician recommends that you schedule a follow-up appointment in: 3-4 weeks  Your physician has recommended that you wear an event monitor. Event monitors are medical devices that record the heart's electrical activity. Doctors most often [place] these monitors to diagnose arrhythmias. Arrhythmias are problems with the speed or rhythm of the heartbeat. The monitor is a small, portable device. You can wear one while you do your normal daily activities. This is usually used to diagnose what is causing palpitations/syncope (passing out). [place] has requested that you have a stress echocardiogram. For further information please visit [email or web address]. Please follow instruction sheet as given.  Your physician has requested that you have an echocardiogram. Echocardiography is a painless test that uses sound waves to create images of your heart. It provides your doctor with information about the size and shape of your heart and how well your heart's chambers and valves are working. This procedure takes approximately one hour. There are no restrictions for this procedure.

## 2013-08-10 NOTE — Assessment & Plan Note (Signed)
ECG: sinus bradycardia Episodes of bilateral arm pain-wakes at night. Risk factors: smoker Referral to cardiology for eval & treat.

## 2013-08-12 ENCOUNTER — Telehealth: Payer: Self-pay | Admitting: *Deleted

## 2013-08-12 DIAGNOSIS — R7989 Other specified abnormal findings of blood chemistry: Secondary | ICD-10-CM

## 2013-08-12 DIAGNOSIS — E538 Deficiency of other specified B group vitamins: Secondary | ICD-10-CM

## 2013-08-12 MED ORDER — CYANOCOBALAMIN 1000 MCG/ML IJ SOLN: 1000.0000 ug | INTRAMUSCULAR | Status: DC

## 2013-08-12 NOTE — Telephone Encounter (Signed)
Patient called office requesting lab results. Please advise?  

## 2013-08-12 NOTE — Telephone Encounter (Signed)
FYI : The info below is what pt forgot to give you as part of her medical history

## 2013-08-12 NOTE — Telephone Encounter (Signed)
pls call pt: Advise B12 low, may be contributing to finger tip numbness. She should come in for 4 weekly B12 shots then will start on B complex daily. Schedule CMA visit for b12 weekly for 4 weeks.  Need to check thyroid function again. Schedule OV 2 weeks after 4th B12 inj & I will check thyroid again. (THS low, T4 nml, no TPO). All other labs good.

## 2013-08-12 NOTE — Telephone Encounter (Signed)
PT JUST WANTED [place] TO KNOW THAT HER GRANDMOTHER DIED OF LEAKY VALVUE. HER MOM AND SISTER HAVE IRREGULAR HEART BEAT.   DR WEAVER CALLED HER AND TOLD HER HER B12 AND THYROID IS LOW. SHE IS STARTING B12 SHOTS ON MONDAY

## 2013-08-12 NOTE — Telephone Encounter (Signed)
Patient notified. Patient expressed understanding. Appt scheduled.

## 2013-08-12 NOTE — Telephone Encounter (Signed)
Called pt with results. No answer. Will try to cb later.

## 2013-08-15 ENCOUNTER — Ambulatory Visit (INDEPENDENT_AMBULATORY_CARE_PROVIDER_SITE_OTHER): Payer: 59 | Admitting: *Deleted

## 2013-08-15 DIAGNOSIS — E538 Deficiency of other specified B group vitamins: Secondary | ICD-10-CM | POA: Diagnosis not present

## 2013-08-15 MED ORDER — CYANOCOBALAMIN 1000 MCG/ML IJ SOLN
1000.0000 ug | Freq: Once | INTRAMUSCULAR | Status: AC
  Administered 2013-08-15: 1000 ug via INTRAMUSCULAR

## 2013-08-18 ENCOUNTER — Encounter (HOSPITAL_COMMUNITY): Payer: Self-pay

## 2013-08-18 ENCOUNTER — Ambulatory Visit (HOSPITAL_COMMUNITY)
Admission: RE | Admit: 2013-08-18 | Discharge: 2013-08-18 | Disposition: A | Discharge status: Home / Self Care | Payer: 59 | Source: Ambulatory Visit | Attending: Cardiovascular Disease | Admitting: Cardiovascular Disease

## 2013-08-18 DIAGNOSIS — I369 Nonrheumatic tricuspid valve disorder, unspecified: Secondary | ICD-10-CM | POA: Diagnosis not present

## 2013-08-18 DIAGNOSIS — F17200 Nicotine dependence, unspecified, uncomplicated: Secondary | ICD-10-CM | POA: Insufficient documentation

## 2013-08-18 DIAGNOSIS — R079 Chest pain, unspecified: Secondary | ICD-10-CM

## 2013-08-18 DIAGNOSIS — J449 Chronic obstructive pulmonary disease, unspecified: Secondary | ICD-10-CM | POA: Insufficient documentation

## 2013-08-18 DIAGNOSIS — M79609 Pain in unspecified limb: Secondary | ICD-10-CM | POA: Diagnosis not present

## 2013-08-18 DIAGNOSIS — M79602 Pain in left arm: Secondary | ICD-10-CM

## 2013-08-18 DIAGNOSIS — I079 Rheumatic tricuspid valve disease, unspecified: Secondary | ICD-10-CM | POA: Insufficient documentation

## 2013-08-18 DIAGNOSIS — I10 Essential (primary) hypertension: Secondary | ICD-10-CM | POA: Insufficient documentation

## 2013-08-18 DIAGNOSIS — J4489 Other specified chronic obstructive pulmonary disease: Secondary | ICD-10-CM | POA: Insufficient documentation

## 2013-08-18 DIAGNOSIS — I498 Other specified cardiac arrhythmias: Secondary | ICD-10-CM | POA: Insufficient documentation

## 2013-08-18 DIAGNOSIS — M79601 Pain in right arm: Secondary | ICD-10-CM

## 2013-08-18 NOTE — Progress Notes (Signed)
Stress Lab Nurses Notes - [name]  [name] 08/18/2013 Reason for doing test: chest pain, arm pain, & bradycardia Type of test: Stress Echo Nurse performing test: Gerrit Halls, RN Nuclear Medicine Tech: Not Applicable Echo Tech: Jamison Neighbor MD performing test: Branch/K.LawrenceNP Family MD: Waver Test explained and consent signed: Yes.   IV started: No IV started Symptoms: Fatigue  Treatment/Intervention: None Reason test stopped: fatigue and reached target HR After recovery IV was: [name] Patient to return to Nuc. Med at : [name] Patient discharged: Home Patient's Condition upon discharge was: stable Comments: During test peak BP 211/110 & HR 141.  Recovery BP 151/98 & HR 78.  Symptoms resolved in recovery.  Geanie Cooley T

## 2013-08-18 NOTE — Progress Notes (Signed)
  Echocardiogram 2D Echocardiogram has been performed.  [place], [place] 08/18/2013, 9:15 AM

## 2013-08-18 NOTE — Progress Notes (Signed)
  Echocardiogram Echocardiogram Stress Test has been performed.  [place], [place] 08/18/2013, 10:11 AM

## 2013-08-19 ENCOUNTER — Encounter: Payer: Self-pay | Admitting: Nurse Practitioner

## 2013-08-19 ENCOUNTER — Telehealth: Payer: Self-pay | Admitting: *Deleted

## 2013-08-19 NOTE — Telephone Encounter (Signed)
I spoke with pt yesterday at 5:57 pm and gave her normal test results

## 2013-08-19 NOTE — Telephone Encounter (Signed)
Pt is calling for stress test results

## 2013-08-22 ENCOUNTER — Telehealth: Payer: Self-pay | Admitting: *Deleted

## 2013-08-22 ENCOUNTER — Ambulatory Visit (INDEPENDENT_AMBULATORY_CARE_PROVIDER_SITE_OTHER): Payer: 59 | Admitting: *Deleted

## 2013-08-22 DIAGNOSIS — E538 Deficiency of other specified B group vitamins: Secondary | ICD-10-CM | POA: Diagnosis not present

## 2013-08-22 MED ORDER — CYANOCOBALAMIN 1000 MCG/ML IJ SOLN
1000.0000 ug | Freq: Once | INTRAMUSCULAR | Status: AC
  Administered 2013-08-22: 1000 ug via INTRAMUSCULAR

## 2013-08-22 NOTE — Telephone Encounter (Signed)
pls call pt: Advise Numbness in hands & fingers may be due to B12 deficiency, if so, it will get better w/B12 injections.  OR, it may be due to arthritis in neck causing nerve inflammation. If she wants to see a spine specialist, I will refer her. If her insurance is about to run out, she might want to consider getting evaluated ASAP.  If she wants to start medicine for anxiety, she needs to make OV appt. W/me.

## 2013-08-22 NOTE — Telephone Encounter (Signed)
Patient came in to office for b-12 injection today. Patient requested that you call her when your available to discuss her ongoing symptoms (numbness in hands and stress). Advised pt to make ov appt, pt refused. Patient is longer working and would like to talk to you for a few minutes.

## 2013-08-22 NOTE — Telephone Encounter (Signed)
Patient advised. Patient stated that she would prefer to wait to make ov until after she has completed her b-12 injections.

## 2013-08-22 NOTE — Telephone Encounter (Signed)
Received EOS report from event monitor placed in Dr. Bronson Ing folder

## 2013-08-26 ENCOUNTER — Ambulatory Visit (INDEPENDENT_AMBULATORY_CARE_PROVIDER_SITE_OTHER): Payer: 59 | Admitting: Family Medicine

## 2013-08-26 ENCOUNTER — Encounter: Payer: Self-pay | Admitting: Family Medicine

## 2013-08-26 VITALS — BP 128/88 | HR 87 | Temp 98.4°F | Ht 66.5 in | Wt 110.0 lb

## 2013-08-26 DIAGNOSIS — S40029A Contusion of unspecified upper arm, initial encounter: Secondary | ICD-10-CM

## 2013-08-26 DIAGNOSIS — S40022A Contusion of left upper arm, initial encounter: Secondary | ICD-10-CM

## 2013-08-26 NOTE — Progress Notes (Signed)
OFFICE NOTE  08/26/2013  CC:  Chief Complaint  Patient presents with  . Insect Bite   HPI: Patient is a 58 y.o. Caucasian female who is here for noticing a red, swollen, painful area on lateral aspect of left wrist yesterday after building a porch.  No fever or nausea.  Feels tired, though.  She doesn't recall getting bit by anything.  Pertinent PMH:  Past medical, surgical, social, and family history reviewed and no changes are noted since last office visit.  MEDS:  Outpatient Prescriptions Prior to Visit  Medication Sig Dispense Refill  . aspirin 81 MG EC tablet Take 1 tablet (81 mg total) by mouth daily. Swallow whole.  30 tablet  12  . Cholecalciferol (VITAMIN D-3 PO) Take 1,000 Int'l Units/day by mouth.      . cyanocobalamin (,VITAMIN B-12,) 1000 MCG/ML injection Inject 1 mL (1,000 mcg total) into the muscle every 7 (seven) days. 4 injections q7d  1 mL  0   No facility-administered medications prior to visit.    PE: Blood pressure 128/88, pulse 87, temperature 98.4 F (36.9 C), temperature source Temporal, height 5' 6.5" (1.689 m), weight 110 lb (49.896 kg), SpO2 98.00%. Gen: Alert, well appearing.  Patient is oriented to person, place, time, and situation. Left wrist: ulnar styloid is prominent, mildly erythematous/swollen, mildly TTP.  No tenderness, swelling, redness, or bruising over any other area of the wrist or forearm.   IMPRESSION AND PLAN:  Contusion to left wrist ulnar styloid: recommended ice 20 min bid w/compression, plus alleve 2 tabs bid x 3d then 2 tabs qd prn. She'll let us know how it is doing when she comes in for her B12 shot in 3d.  An After Visit Summary was printed and given to the patient.  FOLLOW UP: prn

## 2013-08-26 NOTE — Patient Instructions (Signed)
Take 2 alleve tabs twice a day for 3d, then take 2 tabs once a day if needed. Wrap an icepack with an ACE bandage over your wrist for 20 min, twice per day for the next 3d.

## 2013-08-26 NOTE — Progress Notes (Signed)
Pre visit review using our clinic review tool, if applicable. No additional management support is needed unless otherwise documented below in the visit note. 

## 2013-08-26 NOTE — Progress Notes (Deleted)
Pre visit review using our clinic review tool, if applicable. No additional management support is needed unless otherwise documented below in the visit note. 

## 2013-08-29 ENCOUNTER — Ambulatory Visit (INDEPENDENT_AMBULATORY_CARE_PROVIDER_SITE_OTHER): Payer: 59 | Admitting: Family Medicine

## 2013-08-29 ENCOUNTER — Telehealth: Payer: Self-pay | Admitting: Nurse Practitioner

## 2013-08-29 DIAGNOSIS — E538 Deficiency of other specified B group vitamins: Secondary | ICD-10-CM | POA: Diagnosis not present

## 2013-08-29 MED ORDER — CYANOCOBALAMIN 1000 MCG/ML IJ SOLN
1000.0000 ug | Freq: Once | INTRAMUSCULAR | Status: AC
  Administered 2013-08-29: 1000 ug via INTRAMUSCULAR

## 2013-08-29 NOTE — Telephone Encounter (Signed)
Patient has lost 4 lbs in the past week. She states she has been eating "like a horse". She would like to discuss lab results. Please contact.

## 2013-08-30 NOTE — Telephone Encounter (Signed)
Please encourage her to make appt. If she has specific questions. Her thyroid does need to be checked again-plan was to check it with B12 after her last injection. If she wants two lab draws, she may come in sooner for thyroid studies.

## 2013-08-30 NOTE — Telephone Encounter (Signed)
Spoke with pt, advised message from [place]ne. Pt understood. 

## 2013-09-05 ENCOUNTER — Ambulatory Visit: Payer: 59

## 2013-09-05 DIAGNOSIS — E538 Deficiency of other specified B group vitamins: Secondary | ICD-10-CM

## 2013-09-05 MED ORDER — CYANOCOBALAMIN 1000 MCG/ML IJ SOLN
1000.0000 ug | Freq: Once | INTRAMUSCULAR | Status: AC
  Administered 2013-09-05: 1000 ug via INTRAMUSCULAR

## 2013-09-06 ENCOUNTER — Telehealth: Payer: Self-pay | Admitting: Nurse Practitioner

## 2013-09-06 DIAGNOSIS — R7989 Other specified abnormal findings of blood chemistry: Secondary | ICD-10-CM

## 2013-09-06 DIAGNOSIS — R5383 Other fatigue: Secondary | ICD-10-CM

## 2013-09-06 NOTE — Telephone Encounter (Signed)
Will refer to endo, given symptoms of unintentional weight loss, feeling anxious, lack of energy. TSH low, free T4 nml, TPO nml.  She just had series of weekly B12 injections-reports no improvement in energy. Arm pain is better, but paresthesia in fingers is persistent.  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  Spoke w/pt, explained my concerns & reason for referral. Answered all questions. She is in agreement to be evaluated by endo.

## 2013-09-09 ENCOUNTER — Ambulatory Visit: Payer: 59 | Admitting: Cardiovascular Disease

## 2013-09-19 ENCOUNTER — Encounter: Payer: Self-pay | Admitting: Nurse Practitioner

## 2013-09-19 ENCOUNTER — Ambulatory Visit (INDEPENDENT_AMBULATORY_CARE_PROVIDER_SITE_OTHER): Payer: 59 | Admitting: Nurse Practitioner

## 2013-09-19 VITALS — BP 160/110 | HR 75 | Temp 97.9°F | Resp 18 | Ht 66.5 in | Wt 116.0 lb

## 2013-09-19 DIAGNOSIS — I10 Essential (primary) hypertension: Secondary | ICD-10-CM

## 2013-09-19 DIAGNOSIS — R946 Abnormal results of thyroid function studies: Secondary | ICD-10-CM | POA: Diagnosis not present

## 2013-09-19 DIAGNOSIS — R7989 Other specified abnormal findings of blood chemistry: Secondary | ICD-10-CM

## 2013-09-19 DIAGNOSIS — E538 Deficiency of other specified B group vitamins: Secondary | ICD-10-CM

## 2013-09-19 DIAGNOSIS — Z23 Encounter for immunization: Secondary | ICD-10-CM

## 2013-09-19 DIAGNOSIS — M79609 Pain in unspecified limb: Secondary | ICD-10-CM

## 2013-09-19 DIAGNOSIS — D582 Other hemoglobinopathies: Secondary | ICD-10-CM | POA: Diagnosis not present

## 2013-09-19 DIAGNOSIS — M79602 Pain in left arm: Secondary | ICD-10-CM

## 2013-09-19 DIAGNOSIS — M79601 Pain in right arm: Secondary | ICD-10-CM

## 2013-09-19 LAB — COMPREHENSIVE METABOLIC PANEL
ALBUMIN: 3.9 g/dL (ref 3.5–5.2)
ALT: 25 U/L (ref 0–35)
AST: 30 U/L (ref 0–37)
Alkaline Phosphatase: 91 U/L (ref 39–117)
BUN: 17 mg/dL (ref 6–23)
CALCIUM: 9.7 mg/dL (ref 8.4–10.5)
CHLORIDE: 103 meq/L (ref 96–112)
CO2: 31 meq/L (ref 19–32)
CREATININE: 1.1 mg/dL (ref 0.4–1.2)
GFR: 51.83 mL/min — AB (ref >60.00)
Glucose, Bld: 87 mg/dL (ref 70–99)
POTASSIUM: 4.2 meq/L (ref 3.5–5.1)
Sodium: 140 mEq/L (ref 135–145)
Total Bilirubin: 0.6 mg/dL (ref 0.2–1.2)
Total Protein: 7 g/dL (ref 6.0–8.3)

## 2013-09-19 LAB — IRON AND TIBC
%SAT: 34 % (ref 20–55)
Iron: 111 ug/dL (ref 42–145)
TIBC: 322 ug/dL (ref 250–470)
UIBC: 211 ug/dL (ref 125–400)

## 2013-09-19 LAB — CBC
HCT: 45.2 % (ref 36.0–46.0)
Hemoglobin: 15.4 g/dL — ABNORMAL HIGH (ref 12.0–15.0)
MCHC: 34.1 g/dL (ref 30.0–36.0)
MCV: 90.5 fl (ref 78.0–100.0)
Platelets: 170 10*3/uL (ref 150.0–400.0)
RBC: 4.99 Mil/uL (ref 3.87–5.11)
RDW: 12.6 % (ref 11.5–15.5)
WBC: 5.3 10*3/uL (ref 4.0–10.5)

## 2013-09-19 LAB — VITAMIN B12: Vitamin B-12: 783 pg/mL (ref 211–911)

## 2013-09-19 LAB — MICROALBUMIN / CREATININE URINE RATIO
CREATININE, U: 63.3 mg/dL
Microalb Creat Ratio: 5.7 mg/g (ref 0.0–30.0)
Microalb, Ur: 3.6 mg/dL — ABNORMAL HIGH (ref 0.0–1.9)

## 2013-09-19 LAB — T4, FREE: Free T4: 0.72 ng/dL (ref 0.60–1.60)

## 2013-09-19 LAB — TSH: TSH: 13.89 u[IU]/mL — AB (ref 0.35–4.50)

## 2013-09-19 LAB — T3: T3, Total: 115.8 ng/dL (ref 80.0–204.0)

## 2013-09-19 MED ORDER — LISINOPRIL 10 MG PO TABS: 10.0000 mg | ORAL_TABLET | Freq: Every day | ORAL | Status: DC

## 2013-09-19 NOTE — Assessment & Plan Note (Signed)
Pt reports bilat arm pain resolved. Cardio w/u nml. Had 6 weeks weekly B12 inj. Quit job, in process of applying for disability. Not sure etio of arm pain.

## 2013-09-19 NOTE — Patient Instructions (Signed)
Start blood pressure medicine. Take in the morning. Goal blood pressure is under 135/85. Take blood pressure 3 times over the next 2 weeks at same time each day. Keep feet flat on floor, cuff at level of heart, sit for 5 minutes before taking pressure. Call office if under 100/60 or over 140/90.  Continue Vitamin B complex, D3, and daily aspirin.  See Dr Cruzita Lederer.  My office will call regarding lab results. I will see you in 1 month to review blood pressure medicine results. Bring blood pressure reading with you.    Nice to see you!  Hypertension Hypertension, commonly called high blood pressure, is when the force of blood pumping through your arteries is too strong. Your arteries are the blood vessels that carry blood from your heart throughout your body. A blood pressure reading consists of a higher number over a lower number, such as 110/72. The higher number (systolic) is the pressure inside your arteries when your heart pumps. The lower number (diastolic) is the pressure inside your arteries when your heart relaxes. Ideally you want your blood pressure below 120/80. Hypertension forces your heart to work harder to pump blood. Your arteries may become narrow or stiff. Having hypertension puts you at risk for heart disease, stroke, and other problems.  RISK FACTORS Some risk factors for high blood pressure are controllable. Others are not.  Risk factors you cannot control include:   Race. You may be at higher risk if you are African American.  Age. Risk increases with age.  Gender. Men are at higher risk than women before age 82 years. After age 41, women are at higher risk than men. Risk factors you can control include:  Not getting enough exercise or physical activity.  Being overweight.  Getting too much fat, sugar, calories, or salt in your diet.  Drinking too much alcohol. SIGNS AND SYMPTOMS Hypertension does not usually cause signs or symptoms. Extremely high blood  pressure (hypertensive crisis) may cause headache, anxiety, shortness of breath, and nosebleed. DIAGNOSIS  To check if you have hypertension, your health care provider will measure your blood pressure while you are seated, with your arm held at the level of your heart. It should be measured at least twice using the same arm. Certain conditions can cause a difference in blood pressure between your right and left arms. A blood pressure reading that is higher than normal on one occasion does not mean that you need treatment. If one blood pressure reading is high, ask your health care provider about having it checked again. TREATMENT  Treating high blood pressure includes making lifestyle changes and possibly taking medicine. Living a healthy lifestyle can help lower high blood pressure. You may need to change some of your habits. Lifestyle changes may include:  Following the DASH diet. This diet is high in fruits, vegetables, and whole grains. It is low in salt, red meat, and added sugars.  Getting at least 2 hours of brisk physical activity every week.  Losing weight if necessary.  Not smoking.  Limiting alcoholic beverages.  Learning ways to reduce stress. If lifestyle changes are not enough to get your blood pressure under control, your health care provider may prescribe medicine. You may need to take more than one. Work closely with your health care provider to understand the risks and benefits. HOME CARE INSTRUCTIONS  Have your blood pressure rechecked as directed by your health care provider.   Take medicines only as directed by your health care provider. Follow  the directions carefully. Blood pressure medicines must be taken as prescribed. The medicine does not work as well when you skip doses. Skipping doses also puts you at risk for problems.   Do not smoke.   Monitor your blood pressure at home as directed by your health care provider. SEEK MEDICAL CARE IF:   You think you  are having a reaction to medicines taken.  You have recurrent headaches or feel dizzy.  You have swelling in your ankles.  You have trouble with your vision. SEEK IMMEDIATE MEDICAL CARE IF:  You develop a severe headache or confusion.  You have unusual weakness, numbness, or feel faint.  You have severe chest or abdominal pain.  You vomit repeatedly.  You have trouble breathing. MAKE SURE YOU:   Understand these instructions.  Will watch your condition.  Will get help right away if you are not doing well or get worse. Document Released: 12/23/2004 Document Revised: 05/09/2013 Document Reviewed: 10/15/2012 [facility] Patient Information 2015 [place], [place]. This information is not intended to replace advice given to you by your health care provider. Make sure you discuss any questions you have with your health care provider.

## 2013-09-19 NOTE — Progress Notes (Signed)
Subjective:     [name] is a 58 y.o. female presents for f/u of bilat arm, B12 deficiency, weight loss, & abnormal thyroid tests.  I was uncertain of arm pain etiology, so I referred to cardio-echo & stress test nml; I also started B12 injections as she was low. Today pt states arm pain is completely resolved. She completed 6 weekly injections and has continued with oral B complex for last 2 weeks. I woill check level today to see if oral supplement will be sufficient. She states she is sleeping better & has more regular BM since starting B12 & vit D, but has not noticed improvement in energy. I referred her to endo due to low TSH, nml t4, weight loss. Also pt gives hX of "knots" & "gland removal" from neck in '89, '91, '93, & '95. She does not know what the knots or the gland was. She denies jitteryness, diarrhea, constipation; but reports remote Hx of sleep disturbance, cold intolerance, palpitations, & anxiety. 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 She quit her job w/[employer] several weeks ago. Of note, she thinks she "passed out" a few nights ago when she got up during night to use bathroom, she hit wall-doesn't remember what happened. C/o mild soreness of L shoulder-sequela from "hitting wall". Blood pressure is elevated today. 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    The following portions of the patient's history were reviewed and updated as appropriate: allergies, current medications, past family history, past medical history, past social history, past surgical history and problem list.  Review of Systems Pertinent items are noted in HPI.    Objective:    BP 160/110  Pulse 75  Temp(Src) 97.9 F (36.6 C) (Oral)  Resp 18  Ht 5' 6.5" (1.689 m)  Wt 116 lb (52.617 kg)  BMI 18.44 kg/m2  SpO2 100% BP 160/110  Pulse 75   Temp(Src) 97.9 F (36.6 C) (Oral)  Resp 18  Ht 5' 6.5" (1.689 m)  Wt 116 lb (52.617 kg)  BMI 18.44 kg/m2  SpO2 100% General appearance: alert, cooperative, appears stated age and moves from topic to topic Head: Normocephalic, without obvious abnormality, atraumatic Eyes: negative findings: lids and lashes normal and conjunctivae and sclerae normal Neck: no adenopathy, no carotid bruit, supple, symmetrical, trachea midline, thyroid not enlarged, symmetric, no tenderness/mass/nodules and well-healed scar noted R lateral neck Lungs: clear to auscultation bilaterally Heart: regular rate and rhythm, S1, S2 normal, no murmur, click, rub or gallop Extremities: extremities normal, atraumatic, no cyanosis or edema and no bruise or abrasion L shoulder. FROM, no point tenderness.    Assessment:     1. Essential hypertension, benign - Microalbumin / creatinine urine ratio - lisinopril (PRINIVIL,ZESTRIL) 10 MG tablet; Take 1 tablet (10 mg total) by mouth daily.  Dispense: 30 tablet; Refill: 1 1 kidney  2. Elevated hemoglobin DD: smoker, hemachromatosis - CBC - Iron and TIBC  3. Low TSH level Keep appt. W/Dr Cruzita Lederer - Comprehensive metabolic panel - T4, free - T3 - TSH - Thyroid peroxidase antibody  4. B12 deficiency Continue oral supplement - Vitamin B12  6. Need for prophylactic vaccination and inoculation against influenza - Flu Vaccine QUAD 36+ mos PF IM (Fluarix Quad PF)  7. Bilateral arm pain Resolved Unclear etio cardio w/u nml.  See pt instructions F/u 4 weeks.

## 2013-09-19 NOTE — Addendum Note (Signed)
Addended by: Jannette Spanner on: 09/19/2013 01:24 PM   Modules accepted: Orders

## 2013-09-19 NOTE — Progress Notes (Signed)
Pre visit review using our clinic review tool, if applicable. No additional management support is needed unless otherwise documented below in the visit note. 

## 2013-09-20 ENCOUNTER — Telehealth: Payer: Self-pay | Admitting: Nurse Practitioner

## 2013-09-20 ENCOUNTER — Other Ambulatory Visit: Payer: Self-pay | Admitting: *Deleted

## 2013-09-20 DIAGNOSIS — R42 Dizziness and giddiness: Secondary | ICD-10-CM

## 2013-09-20 DIAGNOSIS — R001 Bradycardia, unspecified: Secondary | ICD-10-CM

## 2013-09-20 LAB — THYROID PEROXIDASE ANTIBODY: Thyroperoxidase Ab SerPl-aCnc: 26 IU/mL — ABNORMAL HIGH (ref <9)

## 2013-09-20 NOTE — Telephone Encounter (Signed)
pls call pt: Advise B12 looks good, Continue taking oral B complex. Dr Cruzita Lederer will discuss thyroid labs. Ask if she filled bp med.

## 2013-09-21 NOTE — Telephone Encounter (Signed)
Left message for pt to call back  °

## 2013-09-21 NOTE — Telephone Encounter (Signed)
Spoke with pt, advised lab results. She states the Lisinopril is too strong for her but her blood pressure is normal. It makes her feel like her heart is racing and she doesn't like it. She even tried to half the pill and it still made her feel this way. Please advise.

## 2013-09-22 ENCOUNTER — Encounter: Payer: Self-pay | Admitting: Internal Medicine

## 2013-09-22 ENCOUNTER — Ambulatory Visit (INDEPENDENT_AMBULATORY_CARE_PROVIDER_SITE_OTHER): Payer: 59 | Admitting: Internal Medicine

## 2013-09-22 VITALS — BP 120/62 | HR 71 | Temp 97.6°F | Resp 12 | Ht 67.0 in | Wt 115.0 lb

## 2013-09-22 DIAGNOSIS — E061 Subacute thyroiditis: Secondary | ICD-10-CM

## 2013-09-22 NOTE — Progress Notes (Signed)
Patient ID: [name], female   DOB: November 22, 1955, 58 y.o.   MRN: [id number]   HPI  [name] is a 58 y.o.-year-old female, referred by her PCP, WEAVER, LAYNE C, NP for evaluation for thyrotoxicosis and weight loss.  She describes that in 08/2013 >> woke up with severe arm pain >> went to the hospital >> injection of Dilaudid >> continued to have arm pain and tingling. She then saw PCP >> low B12 vitamin and low TSH. Started B12 injections.   I reviewed pt's thyroid tests: Lab Results  Component Value Date   TSH 13.89* 09/19/2013   TSH 0.06* 08/09/2013   FREET4 0.72 09/19/2013   FREET4 1.29 08/09/2013   Component     Latest Ref Rng 08/09/2013 09/19/2013  Thyroid Peroxidase Antibody     <9 IU/mL 27.1 26 (H)   Pt denies feeling nodules in neck, hoarseness, dysphagia/odynophagia, SOB with lying down; she c/o: - no excessive sweating/heat intolerance; has cold intolerance - + weight loss: 12 lbs, now gained 6 lbs back - no tremors - no anxiety - no palpitations  - no fatigue - no hyperdefecation  She lost 20 lbs after she donated a kidney to her daughter in 2010.   Pt does not have a FH of thyroid ds. No FH of thyroid cancer. No h/o radiation tx to head or neck.   No seaweed or kelp, no recent contrast studies. + steroid use: in finger, shoulder - not recently, last in 01/2013. No herbal supplements.   I reviewed her chart and she also has a history of HTN, COPD, bradycardia.  ROS: Constitutional: + see HPI, + poor sleep Eyes: + blurry vision, no xerophthalmia ENT: no sore throat, no nodules palpated in throat, no dysphagia/odynophagia, no hoarseness Cardiovascular: no CP/SOB/palpitations/leg swelling Respiratory: no cough/SOB Gastrointestinal: no N/V/D/C Musculoskeletal: + all:  muscle/joint aches Skin: no rashes Neurological: no tremors/numbness/tingling/dizziness, + HA Psychiatric: no depression/anxiety  Past Medical History  Diagnosis Date  . Wears dentures    top  . Wears glasses   . 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 Arthritis   . Allergy   . Migraines   . Anxiety   . Thyroid disease     Hx of    Past Surgical History  Procedure Laterality Date  . 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 Neck surgery  1990    rt ? parotidectomy  . Bladder surgery      bladder tac  . Bowel resection    . Abdominal hysterectomy  1991    partial  . Neck mass excision      rt-mass  . Dorsal compartment release Right 02/07/2013    Procedure: RIGHT WRIST DEQUERVAIN RELEASE;  Surgeon: Jolyn Nap, MD;  Location: [facility];  Service: Orthopedics;  Laterality: Right;  . Thyroid surgery     History   Social History  . Marital Status: Single    Spouse Name: N/A    Number of Children: 5   Occupational History  . Unemployed now        Social History Main Topics  . Smoking status: Current Every Day Smoker -- 0.50 packs/day for 40 years  . Smokeless tobacco: Never Used  . Alcohol Use: No  . Drug Use: No   Social History Narrative   Unemployed: Single   She has 5 grown children.    First menstrual  cycle: 13 yrs   Last menstrual cycle: 47 yrs   6 pregnancies   1 miscarriage   Smoker: 1/2 pk daily   Pt donated kidney to daughter 2010   Current Outpatient Prescriptions on File Prior to Visit  Medication Sig Dispense Refill  . aspirin 81 MG EC tablet Take 1 tablet (81 mg total) by mouth daily. Swallow whole.  30 tablet  12  . Cholecalciferol (VITAMIN D-3 PO) Take 1,000 Int'l Units/day by mouth.      . cyanocobalamin (,VITAMIN B-12,) 1000 MCG/ML injection Inject 1 mL (1,000 mcg total) into the muscle every 7 (seven) days. 4 injections q7d  1 mL  0  . lisinopril (PRINIVIL,ZESTRIL) 10 MG tablet Take 1 tablet (10 mg total) by mouth daily.  30 tablet  1   No current facility-administered medications on file prior to visit.   Allergies  Allergen Reactions  . Ibuprofen     Not supposed to  take everyday due to only having one kidney  . Lactose Intolerance (Gi) Diarrhea    cramping  . 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 Other Nausea And Vomiting    Most pain meds and antibiotics  . Sudafed [Pseudoephedrine Hcl]     Makes "high"  . Sulfonamide Derivatives Rash   Family History  Problem Relation Age of Onset  . Arthritis Mother   . Hypertension Mother   . Heart disease Mother     Irregular heart beat   . Cancer Father     Lung  . Hypertension Sister    PE: BP 120/62  Pulse 71  Temp(Src) 97.6 F (36.4 C) (Oral)  Resp 12  Ht 5\' 7"  (1.702 m)  Wt 115 lb (52.164 kg)  BMI 18.01 kg/m2  SpO2 97% Wt Readings from Last 3 Encounters:  09/22/13 115 lb (52.164 kg)  09/19/13 116 lb (52.617 kg)  08/26/13 110 lb (49.896 kg)   Constitutional: thin, in NAD Eyes: PERRLA, EOMI, no exophthalmos, no lid lag, no stare ENT: moist mucous membranes, no thyromegaly, + fullness L thyroid lobe, no cervical lymphadenopathy Cardiovascular: RRR, No MRG Respiratory: CTA B Gastrointestinal: abdomen soft, NT, ND, BS+ Musculoskeletal: no deformities, strength intact in all 4 Skin: moist, warm, no rashes Neurological: no tremor with outstretched hands, DTR normal in all 4  ASSESSMENT: 1. Thyrotoxycosis - now resolved  2. ? L thyroid nodule  PLAN:  1. Patient with a recently found low TSH, without thyrotoxic sxs now, but with previous weight loss. Her TSH was found to be low 1 mo ago, then high 3 days ago. We discussed that this pattern of TFTs can be seen in subacute thyroiditis, especially as her TPO Ab's are also high, but not very high. I explained that the thyrotoxic phase of her thyroiditis episode could have caused her weight loss, which is now improving. It is not predictable whether she will recover her thyroid completely or stay hypothyroid >> I suggested that we check the TSH, fT3 and fT4 in a month   - she does not appear to have exogenous causes for the low/high  TSH.  - I will send her the lab results through [place]  - RTC in 3 months, but sooner for repeat labs  2. ? L thyroid nodule  - on palpation  - will check a thyroid U/S - we discussed that if there is a nodule, we may need a bx to r/o cancer - she agrees  CLINICAL DATA:  Possible left thyroid nodule on physical examination.  EXAM: THYROID ULTRASOUND  TECHNIQUE: Ultrasound examination of the thyroid gland and adjacent soft tissues was performed.  COMPARISON: Cervical spine CT 04/30/2003  FINDINGS: Right thyroid lobe  Measurements: 5.4 x 1.7 x 1.8 cm. Right thyroid tissue is mildly heterogeneous. There is an isoechoic nodule along the posterior right thyroid lobe that measures 1.1 x 1.2 x 0.9 cm. Vascularity of this nodule is similar to the adjacent thyroid parenchyma. No significant calcifications within this right thyroid nodule.  Left thyroid lobe  Measurements: 6.7 x 1.3 x 1.9 cm. Left thyroid tissue is mildly heterogeneous but very vascular. Small heterogeneous nodule in the inferior left thyroid lobe measures 0.3 cm.  Isthmus  Thickness: 0.3 cm. No nodules visualized.  Lymphadenopathy  None visualized.  IMPRESSION: The thyroid tissue is mildly enlarged and hypervascular.  A solid nodule in the posterior right thyroid lobe measures up to 1.2 cm. Findings do not meet current SRU consensus criteria for biopsy. Follow-up by clinical exam is recommended. If patient has known risk factors for thyroid carcinoma, consider follow-up ultrasound in 12 months. If patient is clinically hyperthyroid, consider nuclear medicine thyroid uptake and scan.Reference: Management of Thyroid Nodules Detected at [place]: Society of Radiologists in [place]. Radiology 2005; [id number].   Electronically Signed By: Markus Daft M.D. On: 09/26/2013 15:42  No worrisome thyroid nodules.

## 2013-09-22 NOTE — Patient Instructions (Signed)
Please come back for labs in 5-6 weeks. You will be called with the schedule of the thyroid ultrasound.   Please return in 3 months.

## 2013-09-23 ENCOUNTER — Telehealth: Payer: Self-pay

## 2013-09-23 ENCOUNTER — Other Ambulatory Visit: Payer: Self-pay | Admitting: Nurse Practitioner

## 2013-09-23 ENCOUNTER — Telehealth: Payer: Self-pay | Admitting: Nurse Practitioner

## 2013-09-23 DIAGNOSIS — I10 Essential (primary) hypertension: Secondary | ICD-10-CM

## 2013-09-23 MED ORDER — LOSARTAN POTASSIUM 50 MG PO TABS: 50.0000 mg | ORAL_TABLET | Freq: Every day | ORAL | Status: DC

## 2013-09-23 NOTE — Telephone Encounter (Signed)
I spoke with pt, she states she would like a blood pressure medicine that has a low mg. I advised pt that the Losartan is 50 mg which is the lowest dose. She states she would like something that is 5 mg. Please advise

## 2013-09-23 NOTE — Telephone Encounter (Signed)
Explained to pt that medications have different potency & she cannot compare mg of different medicines to determine how much medicine she is getting. Pt feels extremely tired today. Had HA & arm pain again last night. BP was "high".  Thyroid is not stable-up & down.  Has [place] pending on Monday. Encouraged to stick w/lisinopril. Pt agrees to take it.  She will f/u in 4 weeks.

## 2013-09-26 ENCOUNTER — Ambulatory Visit
Admission: RE | Admit: 2013-09-26 | Discharge: 2013-09-26 | Disposition: A | Discharge status: Home / Self Care | Payer: 59 | Source: Ambulatory Visit | Attending: Internal Medicine | Admitting: Internal Medicine

## 2013-09-28 NOTE — Telephone Encounter (Signed)
pls call pt: Advise Suggest she get referral from the doctor in [place] who told her she has arthritis.

## 2013-09-28 NOTE — Telephone Encounter (Signed)
Patient has been told she has arthrititis in her shoulder by a doctor in [place]. She is requesting a referral to a specialist.

## 2013-09-29 NOTE — Telephone Encounter (Signed)
Spoke with pt, she states she saw a doctor in [place] a long time ago and feels she needs to see Dr Amil Amen because her mom goes to him. Her mom has history of rheumatoid arthritis and she wants to see a specialist to figure out why she hurts so much. She feels it is arthritis. Please advise.

## 2013-09-29 NOTE — Telephone Encounter (Signed)
PLs have her come in to eval painful joints. I will perform screening blood work for inflammatory arthritis.

## 2013-09-30 NOTE — Telephone Encounter (Signed)
Spoke with pt, advised to RTC to see Layne. Pt mad appt for Monday.

## 2013-10-03 ENCOUNTER — Other Ambulatory Visit: Payer: Self-pay | Admitting: Nurse Practitioner

## 2013-10-03 ENCOUNTER — Ambulatory Visit (INDEPENDENT_AMBULATORY_CARE_PROVIDER_SITE_OTHER): Payer: 59 | Admitting: Nurse Practitioner

## 2013-10-03 ENCOUNTER — Encounter: Payer: Self-pay | Admitting: Nurse Practitioner

## 2013-10-03 VITALS — BP 140/96 | HR 76 | Temp 98.1°F | Ht 66.5 in | Wt 115.1 lb

## 2013-10-03 DIAGNOSIS — M2550 Pain in unspecified joint: Secondary | ICD-10-CM | POA: Diagnosis not present

## 2013-10-03 DIAGNOSIS — M79609 Pain in unspecified limb: Secondary | ICD-10-CM

## 2013-10-03 DIAGNOSIS — L942 Calcinosis cutis: Secondary | ICD-10-CM | POA: Insufficient documentation

## 2013-10-03 DIAGNOSIS — IMO0001 Reserved for inherently not codable concepts without codable children: Secondary | ICD-10-CM

## 2013-10-03 DIAGNOSIS — L408 Other psoriasis: Secondary | ICD-10-CM

## 2013-10-03 DIAGNOSIS — R634 Abnormal weight loss: Secondary | ICD-10-CM

## 2013-10-03 DIAGNOSIS — M79602 Pain in left arm: Secondary | ICD-10-CM

## 2013-10-03 DIAGNOSIS — R030 Elevated blood-pressure reading, without diagnosis of hypertension: Secondary | ICD-10-CM

## 2013-10-03 DIAGNOSIS — L988 Other specified disorders of the skin and subcutaneous tissue: Secondary | ICD-10-CM | POA: Diagnosis not present

## 2013-10-03 DIAGNOSIS — M79601 Pain in right arm: Secondary | ICD-10-CM

## 2013-10-03 DIAGNOSIS — L409 Psoriasis, unspecified: Secondary | ICD-10-CM | POA: Insufficient documentation

## 2013-10-03 LAB — CK: CK TOTAL: 102 U/L (ref 7–177)

## 2013-10-03 LAB — URIC ACID: URIC ACID, SERUM: 5 mg/dL (ref 2.4–7.0)

## 2013-10-03 LAB — SEDIMENTATION RATE: Sed Rate: 8 mm/h (ref 0–22)

## 2013-10-03 MED ORDER — DICLOFENAC SODIUM 1 % TD GEL: TRANSDERMAL | Status: DC
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
## 2013-10-03 NOTE — Assessment & Plan Note (Signed)
W/u to screen for RA, SLE, dermatomyositis, scleroderma Mother  & Mat Uncle-RA, brother brain aneurysm Decrease aleve & ibuprophen as 1 kidney Voltaren gel on neck

## 2013-10-03 NOTE — Assessment & Plan Note (Signed)
HLA-B27 Ref to rheum once labs result.

## 2013-10-03 NOTE — Assessment & Plan Note (Signed)
W/u to include screening for dermatomyositis, SLE, RA, scleroderma Will refer to rheum as needed.

## 2013-10-03 NOTE — Assessment & Plan Note (Signed)
Fair control, not at goal Pt resistant to take increased dose. Stressed need to protect kidney. Pt to call if BP not consistently under 135/85.

## 2013-10-03 NOTE — Patient Instructions (Signed)
My office will call with lab results.  I'd rather you use voltaren gel on neck than take aleve every day as aleve & ibuprophen are harder on your kidney. Do not take aleve or ibuprophen more than once daily. Apply voltareg gel to back of neck up to 3 times daily as needed for pain-use a quarter- sized amount.   I will refer you to dr Amil Amen once your labs are back.  Your blood pressure needs to stay under 135/85 for best kidney protection. PLEASE call me if it is not consistently controlled.  Let me know if you change your mind about muscle relaxer.   Nice to see you.

## 2013-10-03 NOTE — Progress Notes (Signed)
Pre visit review using our clinic review tool, if applicable. No additional management support is needed unless otherwise documented below in the visit note. 

## 2013-10-04 LAB — RHEUMATOID FACTOR: Rhuematoid fact SerPl-aCnc: <10 IU/mL (ref <=14)

## 2013-10-04 LAB — ANTI-SCLERODERMA ANTIBODY: Scleroderma (Scl-70) (ENA) Antibody, IgG: <1

## 2013-10-04 LAB — MITOCHONDRIAL (M2) ANTIBODY: Mitochondrial Ab: 7.2 Units (ref 0.0–20.0)

## 2013-10-04 LAB — JO-1 ANTIBODY-IGG: Jo-1 Antibody, IgG: <1

## 2013-10-04 LAB — LACTATE DEHYDROGENASE: LDH: 178 U/L (ref 94–250)

## 2013-10-04 LAB — FANA STAINING PATTERNS: Homogeneous Pattern: 1:320 {titer} — ABNORMAL HIGH

## 2013-10-04 LAB — ANTINUCLEAR ANTIBODIES, IFA

## 2013-10-05 ENCOUNTER — Other Ambulatory Visit: Payer: Self-pay | Admitting: Nurse Practitioner

## 2013-10-05 ENCOUNTER — Other Ambulatory Visit: Payer: Self-pay

## 2013-10-05 DIAGNOSIS — R768 Other specified abnormal immunological findings in serum: Secondary | ICD-10-CM

## 2013-10-05 LAB — HLA-B27 ANTIGEN: DNA RESULT: NOT DETECTED

## 2013-10-05 LAB — ALDOLASE

## 2013-10-06 LAB — ENA 9 PANEL
Centromere Ab Screen: <1
ENA SM Ab Ser-aCnc: <1
Jo-1 Antibody, IgG: <1
Ribosomal P Protein Ab: <1
SM/RNP: <1
SSA (RO) (ENA) ANTIBODY, IGG: NEGATIVE
SSB (La) (ENA) Antibody, IgG: <1
Scleroderma (Scl-70) (ENA) Antibody, IgG: <1
ds DNA Ab: 1 IU/mL

## 2013-10-07 ENCOUNTER — Telehealth: Payer: Self-pay | Admitting: Nurse Practitioner

## 2013-10-07 DIAGNOSIS — R768 Other specified abnormal immunological findings in serum: Secondary | ICD-10-CM

## 2013-10-07 DIAGNOSIS — M25549 Pain in joints of unspecified hand: Secondary | ICD-10-CM

## 2013-10-07 DIAGNOSIS — M25519 Pain in unspecified shoulder: Secondary | ICD-10-CM

## 2013-10-07 LAB — SPECIMEN STATUS REPORT

## 2013-10-07 LAB — CYCLIC CITRUL PEPTIDE ANTIBODY, IGG/IGA

## 2013-10-07 NOTE — Telephone Encounter (Signed)
ANA elevated, homogenous pattern which is associated with SLE, but ds DNA & Smith ab are neg.  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 Will ref to rheum for eval.  Pt advised. All questions answered.

## 2013-10-07 NOTE — Telephone Encounter (Signed)
See phone note

## 2013-10-13 ENCOUNTER — Telehealth: Payer: Self-pay | Admitting: Nurse Practitioner

## 2013-10-13 NOTE — Telephone Encounter (Signed)
Patient congestion & cough for 2 weeks. She also cannot hear out of her left ear. Patient requesting CB tomorrow 10/14/13

## 2013-10-13 NOTE — Telephone Encounter (Signed)
She needs an appt.

## 2013-10-13 NOTE — Telephone Encounter (Signed)
Please advise 

## 2013-10-14 NOTE — Telephone Encounter (Signed)
Left detailed message on pt's vm

## 2013-10-17 ENCOUNTER — Ambulatory Visit: Payer: 59 | Admitting: Nurse Practitioner

## 2013-10-18 ENCOUNTER — Ambulatory Visit (INDEPENDENT_AMBULATORY_CARE_PROVIDER_SITE_OTHER): Payer: 59 | Admitting: Nurse Practitioner

## 2013-10-18 ENCOUNTER — Encounter: Payer: Self-pay | Admitting: Nurse Practitioner

## 2013-10-18 VITALS — BP 119/83 | HR 101 | Temp 97.6°F | Ht 66.5 in | Wt 114.0 lb

## 2013-10-18 DIAGNOSIS — R059 Cough, unspecified: Secondary | ICD-10-CM

## 2013-10-18 DIAGNOSIS — R05 Cough: Secondary | ICD-10-CM | POA: Diagnosis not present

## 2013-10-18 DIAGNOSIS — J988 Other specified respiratory disorders: Secondary | ICD-10-CM | POA: Diagnosis not present

## 2013-10-18 MED ORDER — DOXYCYCLINE HYCLATE 100 MG PO TABS: 100.0000 mg | ORAL_TABLET | Freq: Two times a day (BID) | ORAL | Status: DC

## 2013-10-18 MED ORDER — ALBUTEROL SULFATE HFA 108 (90 BASE) MCG/ACT IN AERS: INHALATION_SPRAY | RESPIRATORY_TRACT | Status: DC

## 2013-10-18 MED ORDER — HYDROCODONE-HOMATROPINE 5-1.5 MG/5ML PO SYRP: 5.0000 mL | ORAL_SOLUTION | Freq: Every evening | ORAL | Status: DC | PRN

## 2013-10-18 NOTE — Progress Notes (Signed)
Pre visit review using our clinic review tool, if applicable. No additional management support is needed unless otherwise documented below in the visit note. 

## 2013-10-18 NOTE — Progress Notes (Signed)
   Subjective:    Patient ID: [name], female    DOB: 1955-12-14, 58 y.o.   MRN: [id number]  HPI Comments: [name] states she has not smoked in 3 weeks. She has not taken BP med in 3 weeks.  Cough This is a chronic problem. The current episode started 1 to 4 weeks ago (3 wks). The problem has been unchanged. The problem occurs every few minutes. The cough is non-productive. Associated symptoms include chest pain (rib pain when coughs), ear congestion, a fever, nasal congestion, postnasal drip and a sore throat (resolved). Pertinent negatives include no chills, ear pain, headaches, shortness of breath or wheezing. Nothing aggravates the symptoms. Risk factors for lung disease include smoking/tobacco exposure. Treatments tried: multiple OTC cold meds. The treatment provided no relief. Her past medical history is significant for COPD.      Review of Systems  Constitutional: Positive for fever and fatigue. Negative for chills.  HENT: Positive for congestion, postnasal drip and sore throat (resolved). Negative for ear pain.   Respiratory: Positive for cough. Negative for chest tightness, shortness of breath and wheezing.   Cardiovascular: Positive for chest pain (rib pain when coughs).  Gastrointestinal: Positive for diarrhea (resolved). Negative for nausea.  Neurological: Negative for headaches.       Objective:   Physical Exam  Vitals reviewed. Constitutional: She is oriented to person, place, and time. She appears well-developed and well-nourished.  HENT:  Head: Normocephalic and atraumatic.  Right Ear: External ear normal.  Left Ear: External ear normal.  Mouth/Throat: Oropharynx is clear and moist. No oropharyngeal exudate.  Eyes: Conjunctivae are normal. Right eye exhibits no discharge. Left eye exhibits no discharge.  Neck: Normal range of motion. Neck supple. No thyromegaly present.  Cardiovascular: Regular rhythm and normal heart sounds.   No murmur  heard. Tachycardic   Pulmonary/Chest: Effort normal and breath sounds normal. No respiratory distress. She has no wheezes. She has no rales.  Lymphadenopathy:    She has no cervical adenopathy.  Neurological: She is alert and oriented to person, place, and time.  Skin: Skin is warm and dry.  Psychiatric: She has a normal mood and affect. Her behavior is normal. Thought content normal.          Assessment & Plan:  1. Respiratory infection - doxycycline (VIBRA-TABS) 100 MG tablet; Take 1 tablet (100 mg total) by mouth 2 (two) times daily.  Dispense: 20 tablet; Refill: 0 - albuterol (PROVENTIL HFA;VENTOLIN HFA) 108 (90 BASE) MCG/ACT inhaler; 2 puffs BID for 3 days, then q8h PRN cough.  Dispense: 1 Inhaler; Refill: 1  2. Cough - HYDROcodone-homatropine (HYCODAN) 5-1.5 MG/5ML syrup; Take 5 mLs by mouth at bedtime as needed for cough.  Dispense: 120 mL; Refill: 0 - albuterol (PROVENTIL HFA;VENTOLIN HFA) 108 (90 BASE) MCG/ACT inhaler; 2 puffs BID for 3 days, then q8h PRN cough.  Dispense: 1 Inhaler; Refill: 1  See pt instructions F/u PRN

## 2013-10-18 NOTE — Patient Instructions (Addendum)
Start antibiotic. Take every 12 hours with food. It will make you nauseous.  Eat yogurt daily at lunch time to prevent antibiotic-associated diarrhea.  Get Neilmed sinus rinse in drug store-it's in the cold & allergy isle. Use it once daily for about 7 days to clear nasal mucus & decrease post-nasal drip.  Use cough syrup at night so you can sleep.  Use inhaler twice daily for 3 to 4 days, then as needed for cough.  Let me know if you are not feeling better in 2 weeks or sooner if fever goes up to 100.4.

## 2013-10-19 ENCOUNTER — Telehealth: Payer: Self-pay | Admitting: Nurse Practitioner

## 2013-10-19 NOTE — Telephone Encounter (Signed)
EMMI EMAILED  °

## 2013-10-21 ENCOUNTER — Telehealth: Payer: Self-pay | Admitting: *Deleted

## 2013-10-21 NOTE — Telephone Encounter (Addendum)
Patient called office to let you know that her chest still congested. [name] aware.

## 2013-10-25 ENCOUNTER — Telehealth: Payer: Self-pay | Admitting: Nurse Practitioner

## 2013-10-25 ENCOUNTER — Ambulatory Visit (HOSPITAL_COMMUNITY)
Admission: RE | Admit: 2013-10-25 | Discharge: 2013-10-25 | Disposition: A | Discharge status: Home / Self Care | Payer: 59 | Source: Ambulatory Visit | Attending: Nurse Practitioner | Admitting: Nurse Practitioner

## 2013-10-25 ENCOUNTER — Other Ambulatory Visit: Payer: Self-pay | Admitting: Nurse Practitioner

## 2013-10-25 DIAGNOSIS — R0989 Other specified symptoms and signs involving the circulatory and respiratory systems: Secondary | ICD-10-CM | POA: Diagnosis not present

## 2013-10-25 DIAGNOSIS — Z87891 Personal history of nicotine dependence: Secondary | ICD-10-CM | POA: Insufficient documentation

## 2013-10-25 DIAGNOSIS — J189 Pneumonia, unspecified organism: Secondary | ICD-10-CM

## 2013-10-25 DIAGNOSIS — R059 Cough, unspecified: Secondary | ICD-10-CM

## 2013-10-25 DIAGNOSIS — J449 Chronic obstructive pulmonary disease, unspecified: Secondary | ICD-10-CM | POA: Diagnosis not present

## 2013-10-25 DIAGNOSIS — R079 Chest pain, unspecified: Secondary | ICD-10-CM | POA: Diagnosis not present

## 2013-10-25 DIAGNOSIS — R05 Cough: Secondary | ICD-10-CM

## 2013-10-25 MED ORDER — LEVOFLOXACIN 750 MG PO TABS: 750.0000 mg | ORAL_TABLET | Freq: Every day | ORAL | Status: DC

## 2013-10-25 MED ORDER — GUAIFENESIN 400 MG PO TABS: ORAL_TABLET | ORAL | Status: DC

## 2013-10-25 NOTE — Telephone Encounter (Signed)
Please call pt, ask her to go to [facility] for CXR. I will place order.

## 2013-10-25 NOTE — Telephone Encounter (Signed)
Called patient back. Patient stated that she is still coughing all the time. Pt said that her ribs are hurting really bad from all the coughing. Pt stated that she could not take hydrocodone syrup, it makes her stomach hurt. Patient is still taking antibiotic but is not using the inhaler. Pt wants to know if there is something else she can take that will help with her cough. Please advise?

## 2013-10-25 NOTE — Telephone Encounter (Signed)
Patient notified of results. Patient scheduled appt for 11/03/2013.

## 2013-10-25 NOTE — Telephone Encounter (Signed)
Please call [name] - she has coughed so much she has hurt her side. Please call her. Pt. States she is to sick to come in and has cancelled Thurs. Appt./ [facility]

## 2013-10-25 NOTE — Telephone Encounter (Signed)
Patient notified. Patient stated that she will go have cxr done today.

## 2013-10-27 ENCOUNTER — Ambulatory Visit: Payer: 59 | Admitting: Nurse Practitioner

## 2013-10-28 ENCOUNTER — Telehealth: Payer: Self-pay | Admitting: Nurse Practitioner

## 2013-10-28 NOTE — Telephone Encounter (Signed)
Please call. [name] is in a lot of pain on the right side and would like some pain medicine. [phone]

## 2013-10-28 NOTE — Telephone Encounter (Signed)
Per Layne informed pt to take ibuprofen for side discomfort.

## 2013-10-31 ENCOUNTER — Ambulatory Visit: Payer: 59 | Admitting: Nurse Practitioner

## 2013-11-03 ENCOUNTER — Encounter: Payer: Self-pay | Admitting: Nurse Practitioner

## 2013-11-03 ENCOUNTER — Ambulatory Visit: Payer: 59 | Admitting: Nurse Practitioner

## 2013-11-03 ENCOUNTER — Other Ambulatory Visit: Payer: 59

## 2013-11-03 ENCOUNTER — Ambulatory Visit (INDEPENDENT_AMBULATORY_CARE_PROVIDER_SITE_OTHER): Payer: 59 | Admitting: Nurse Practitioner

## 2013-11-03 VITALS — BP 133/82 | HR 69 | Temp 98.7°F | Ht 66.5 in | Wt 115.0 lb

## 2013-11-03 DIAGNOSIS — J189 Pneumonia, unspecified organism: Secondary | ICD-10-CM | POA: Insufficient documentation

## 2013-11-03 NOTE — Progress Notes (Signed)
Pre visit review using our clinic review tool, if applicable. No additional management support is needed unless otherwise documented below in the visit note. 

## 2013-11-03 NOTE — Progress Notes (Signed)
Subjective:    [name] is a 58 y.o. female who presents for follow up of pneumonia confirmed by CXR (equivocal mild lingular airspace opacity).Pt was seen in ofc 10/13 & doxy was started. At that time she c/o cough, rib pain, & head congestion for 3 weeks. She was afebrile, but I started doxycycline. She had not improved by day 7, so I sent her for CXR & started levaquin. Today she states cough is better. Rib pain, R anterior chest is painful w/coughing, sneezing, & taking deep breath. She is still fatigued, has had some nausea in mornings, relieved by eating.  She had abscessed tooth pulled today, L lower.   The following portions of the patient's history were reviewed and updated as appropriate: allergies, current medications, past medical history, past social history, past surgical history and problem list.  Review of Systems Pertinent items are noted in HPI.   Objective:     BP 133/82  Pulse 69  Temp(Src) 98.7 F (37.1 C) (Temporal)  Ht 5' 6.5" (1.689 m)  Wt 115 lb (52.164 kg)  BMI 18.29 kg/m2  SpO2 99% General appearance: alert, cooperative, appears stated age and no distress Head: Normocephalic, without obvious abnormality, atraumatic Eyes: negative findings: lids and lashes normal, conjunctivae and sclerae normal and wearing glasses today. Lungs: diminished breath sounds bibasilar Heart: regular rate and rhythm, S1, S2 normal, no murmur, click, rub or gallop Mouth: L lower tooth socket, minimum amount of pooled red blood.   Assessment:   1. CAP (community acquired pneumonia) Improved after 7d levaquin. - DG Chest 2 View; Future (6 weeks)  F/u PRN, 5 mos for chronic conditions.

## 2013-11-03 NOTE — Patient Instructions (Addendum)
Do breathing exercises daily: 10 deep breaths, 10 times daily. Hold each breath for 5 seconds before exhaling. Do this for 2 weeks.  You will need to get chest xray at [facility] in 6 weeks. I will call with results.  I will see you in March for follow up of hypertension, unless you need me sooner.  Glad you are feeling better!

## 2013-11-30 ENCOUNTER — Telehealth: Payer: Self-pay

## 2013-11-30 NOTE — Telephone Encounter (Signed)
Pt called wanting to know what she should do about pain on her right side. She states it is very intense and painful. It is relieved when she makes a bowel movement. She also complains of pain when she breathes under her breast like when she had pneumonia. I advised pt to be evaluated but she seems to want advice from [facility]. Please advise or call pt.

## 2013-11-30 NOTE — Telephone Encounter (Signed)
Patient called back. She has to go with her daughter to an appointment this afternoon you can reach her at [phone]

## 2013-11-30 NOTE — Telephone Encounter (Signed)
pls call pt: Advise Pt needs OV for the symptoms she is complaining about. She may want to go to Friday clinic or see me next week. Go to UC if fever.

## 2013-11-30 NOTE — Telephone Encounter (Signed)
Spoke with pt, advised message from [facility]. Pt understood and will go to UC if getting worse. Made appt for Monday.

## 2013-12-03 ENCOUNTER — Encounter (HOSPITAL_COMMUNITY): Payer: Self-pay | Admitting: Emergency Medicine

## 2013-12-03 ENCOUNTER — Emergency Department (HOSPITAL_COMMUNITY)
Admission: EM | Admit: 2013-12-03 | Discharge: 2013-12-03 | Disposition: A | Discharge status: Home / Self Care | Payer: 59 | Attending: Emergency Medicine | Admitting: Emergency Medicine

## 2013-12-03 ENCOUNTER — Emergency Department (HOSPITAL_COMMUNITY): Payer: 59

## 2013-12-03 DIAGNOSIS — F419 Anxiety disorder, unspecified: Secondary | ICD-10-CM | POA: Diagnosis not present

## 2013-12-03 DIAGNOSIS — Z7982 Long term (current) use of aspirin: Secondary | ICD-10-CM | POA: Diagnosis not present

## 2013-12-03 DIAGNOSIS — R0781 Pleurodynia: Secondary | ICD-10-CM | POA: Insufficient documentation

## 2013-12-03 DIAGNOSIS — Z791 Long term (current) use of non-steroidal anti-inflammatories (NSAID): Secondary | ICD-10-CM | POA: Insufficient documentation

## 2013-12-03 DIAGNOSIS — R110 Nausea: Secondary | ICD-10-CM | POA: Insufficient documentation

## 2013-12-03 DIAGNOSIS — R0789 Other chest pain: Secondary | ICD-10-CM | POA: Diagnosis not present

## 2013-12-03 DIAGNOSIS — Z79899 Other long term (current) drug therapy: Secondary | ICD-10-CM | POA: Insufficient documentation

## 2013-12-03 DIAGNOSIS — Z720 Tobacco use: Secondary | ICD-10-CM | POA: Insufficient documentation

## 2013-12-03 DIAGNOSIS — R630 Anorexia: Secondary | ICD-10-CM | POA: Insufficient documentation

## 2013-12-03 DIAGNOSIS — M1990 Unspecified osteoarthritis, unspecified site: Secondary | ICD-10-CM | POA: Diagnosis not present

## 2013-12-03 DIAGNOSIS — Z524 Kidney donor: Secondary | ICD-10-CM | POA: Insufficient documentation

## 2013-12-03 DIAGNOSIS — Z8619 Personal history of other infectious and parasitic diseases: Secondary | ICD-10-CM | POA: Diagnosis not present

## 2013-12-03 DIAGNOSIS — Z8639 Personal history of other endocrine, nutritional and metabolic disease: Secondary | ICD-10-CM | POA: Insufficient documentation

## 2013-12-03 DIAGNOSIS — N644 Mastodynia: Secondary | ICD-10-CM | POA: Diagnosis present

## 2013-12-03 MED ORDER — METHOCARBAMOL 500 MG PO TABS: 500.0000 mg | ORAL_TABLET | Freq: Three times a day (TID) | ORAL | Status: DC | PRN

## 2013-12-03 NOTE — ED Notes (Signed)
PT was told she had pneumonia x4 weeks ago. PT has f/u at primary MD in 2 weeks for repeat chest xray. PT c/o right sided rib cage x3 days. PT denies any fever or SOB or injury.

## 2013-12-03 NOTE — Discharge Instructions (Signed)
Use Tylenol every 4 hours for pain. Use heat on the sore area 3 or 4 times a day. See your doctor as needed, for problems.   Chest Wall Pain Chest wall pain is pain in or around the bones and muscles of your chest. It may take up to 6 weeks to get better. It may take longer if you must stay physically active in your work and activities.  CAUSES  Chest wall pain may happen on its own. However, it may be caused by:  A viral illness like the flu.  Injury.  Coughing.  Exercise.  Arthritis.  Fibromyalgia.  Shingles. HOME CARE INSTRUCTIONS   Avoid overtiring physical activity. Try not to strain or perform activities that cause pain. This includes any activities using your chest or your abdominal and side muscles, especially if heavy weights are used.  Put ice on the sore area.  Put ice in a plastic bag.  Place a towel between your skin and the bag.  Leave the ice on for 15-20 minutes per hour while awake for the first 2 days.  Only take over-the-counter or prescription medicines for pain, discomfort, or fever as directed by your caregiver. SEEK IMMEDIATE MEDICAL CARE IF:   Your pain increases, or you are very uncomfortable.  You have a fever.  Your chest pain becomes worse.  You have new, unexplained symptoms.  You have nausea or vomiting.  You feel sweaty or lightheaded.  You have a cough with phlegm (sputum), or you cough up blood. MAKE SURE YOU:   Understand these instructions.  Will watch your condition.  Will get help right away if you are not doing well or get worse. Document Released: 12/23/2004 Document Revised: 03/17/2011 Document Reviewed: 08/19/2010 [facility] Patient Information 2015 [place], [place]. This information is not intended to replace advice given to you by your health care provider. Make sure you discuss any questions you have with your health care provider.

## 2013-12-03 NOTE — ED Notes (Signed)
MD at bedside. 

## 2013-12-03 NOTE — ED Notes (Signed)
Having pain under right breast.  Rates pain 7.  Did not take any pain meds.

## 2013-12-03 NOTE — ED Provider Notes (Signed)
CSN: [id number]     Arrival date & time 12/03/13  1503 History  This chart was scribed for Richarda Blade, MD by Randa Evens, ED Scribe. This patient was seen in room APA06/APA06 and the patient's care was started at 4:09 PM.     Chief Complaint  Patient presents with  . Breast Pain   The history is provided by the patient. No language interpreter was used.   HPI Comments: [name] is a 58 y.o. female who presents to the Emergency Department complaining of constant right sided chest/rib pain onset 3 days ago. She states she has had some nausea and decreased appetite. She states that deep breathing and movement makes her pain worse. She states that when lying down its hard to get comfortable due to the pain. She denies taking any mediations for pain PTA. She states she has had a HX pneumonia. Denies any recent injury. Denies new cough, SOB, leg swelling, fever, vomiting, dizziness or weakness. She states she is a current every day smoker      Past Medical History  Diagnosis Date  . Wears dentures     top  . Wears glasses   . 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 Arthritis   . Allergy   . Migraines   . Anxiety   . Thyroid disease     Hx of    Past Surgical History  Procedure Laterality Date  . 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 Neck surgery  1990    rt ? parotidectomy  . Bladder surgery      bladder tac  . Bowel resection    . Abdominal hysterectomy  1991    partial  . Neck mass excision      rt-mass  . Dorsal compartment release Right 02/07/2013    Procedure: RIGHT WRIST DEQUERVAIN RELEASE;  Surgeon: Jolyn Nap, MD;  Location: [place];  Service: Orthopedics;  Laterality: Right;  . Thyroid surgery     Family History  Problem Relation Age of Onset  . Arthritis Mother     rheumatoid  . Hypertension Mother   . Heart disease Mother     Irregular heart beat   . Arrhythmia Mother   . Cancer  Father     Lung  . Hypertension Sister   . Aneurysm Brother 11    brain  . Heart disease Maternal Aunt   . Arthritis Maternal Uncle     rheumatoid  . Heart disease Maternal Grandmother   . Diabetes Maternal Uncle    History  Substance Use Topics  . Smoking status: Current Every Day Smoker -- 0.50 packs/day for 40 years  . Smokeless tobacco: Never Used  . Alcohol Use: No   OB History    No data available     Review of Systems  Constitutional: Positive for appetite change. Negative for fever.  Respiratory: Negative for cough and shortness of breath.   Cardiovascular: Positive for chest pain (right sided rib pain). Negative for leg swelling.  Gastrointestinal: Positive for nausea. Negative for vomiting.  Neurological: Negative for dizziness and weakness.  All other systems reviewed and are negative.   Allergies  Ibuprofen; Lactose intolerance (gi); Nsaids; Other; Sudafed; and Sulfonamide derivatives  Home Medications   Prior to Admission medications   Medication Sig Start Date End Date Taking? Authorizing Provider  albuterol (PROVENTIL HFA;VENTOLIN HFA) 108 (90 BASE)  MCG/ACT inhaler 2 puffs BID for 3 days, then q8h PRN cough. 10/18/13  Yes Irene Pap, NP  B Complex Vitamins (B COMPLEX PO) Take by mouth daily.   Yes Historical Provider, MD  diclofenac sodium (VOLTAREN) 1 % GEL Apply quarter-sized amount to back of neck TID PRN Patient taking differently: Apply 2 g topically 3 (three) times daily as needed. Apply quarter-sized amount to back of neck 10/03/13  Yes Irene Pap, NP  lisinopril (PRINIVIL,ZESTRIL) 10 MG tablet Take 10 mg by mouth daily.   Yes Historical Provider, MD  aspirin 81 MG EC tablet Take 1 tablet (81 mg total) by mouth daily. Swallow whole. Patient not taking: Reported on 12/03/2013 08/09/13   Irene Pap, NP  cyanocobalamin (,VITAMIN B-12,) 1000 MCG/ML injection Inject 1 mL (1,000 mcg total) into the muscle every 7 (seven) days. 4 injections  q7d Patient not taking: Reported on 12/03/2013 08/12/13   Irene Pap, NP  guaifenesin (HUMIBID E) 400 MG TABS tablet Take 1t po q8h for 5 days then q8h PRN chest congestion Patient not taking: Reported on 12/03/2013 10/25/13   Irene Pap, NP  HYDROcodone-homatropine (HYCODAN) 5-1.5 MG/5ML syrup Take 5 mLs by mouth at bedtime as needed for cough. Patient not taking: Reported on 12/03/2013 10/18/13   Irene Pap, NP  methocarbamol (ROBAXIN) 500 MG tablet Take 1 tablet (500 mg total) by mouth every 8 (eight) hours as needed for muscle spasms. 12/03/13   Richarda Blade, MD   Triage Vitals: BP 139/75 mmHg  Pulse 82  Temp(Src) 97.7 F (36.5 C) (Oral)  Resp 18  Ht 5\' 7"  (1.702 m)  Wt 115 lb (52.164 kg)  BMI 18.01 kg/m2  SpO2 99%  Physical Exam  Constitutional: She is oriented to person, place, and time. She appears well-developed and well-nourished.  HENT:  Head: Normocephalic and atraumatic.  Eyes: Conjunctivae and EOM are normal. Pupils are equal, round, and reactive to light.  Neck: Normal range of motion and phonation normal. Neck supple.  Cardiovascular: Normal rate and regular rhythm.   Pulmonary/Chest: Effort normal and breath sounds normal. She has no wheezes. She has no rales. She exhibits tenderness.  moderate right chest wall tenderness with out crepitation of deformity, Good air movement   Abdominal: Soft. She exhibits no distension. There is no tenderness. There is no guarding.  Musculoskeletal: Normal range of motion.  no edema calf tenernesss or swelling   Neurological: She is alert and oriented to person, place, and time. She exhibits normal muscle tone.  Skin: Skin is warm and dry.  Psychiatric: She has a normal mood and affect. Her behavior is normal. Judgment and thought content normal.  Nursing note and vitals reviewed.   ED Course  Procedures (including critical care time) DIAGNOSTIC STUDIES: Oxygen Saturation is 99% on RA, normal by my interpretation.     COORDINATION OF CARE: 4:44 PM-Discussed treatment plan with pt at bedside and pt agreed to plan.     Labs Review Labs Reviewed - No data to display  Imaging Review Dg Chest 2 View  12/03/2013   CLINICAL DATA:  Right lower chest pain. Treated for pneumonia diagnosed 4 weeks ago.  EXAM: CHEST  2 VIEW  COMPARISON:  10/25/2013.  FINDINGS: Normal sized heart. Clear lungs. The lungs remain hyperexpanded. Mild scoliosis. Left upper abdominal surgical clips.  IMPRESSION: No acute abnormality.  Stable changes of COPD.   Electronically Signed   By: Enrique Sack M.D.   On: 12/03/2013 16:39  EKG Interpretation None      MDM   Final diagnoses:  Rib pain  Chest wall pain   Suspect musculoskeletal chest wall pain, without evidence for pneumonia, PE or ACS.   Nursing Notes Reviewed/ Care Coordinated Applicable Imaging Reviewed Interpretation of Laboratory Data incorporated into ED treatment  The patient appears reasonably screened and/or stabilized for discharge and I doubt any other medical condition or other [facility] requiring further screening, evaluation, or treatment in the ED at this time prior to discharge.  Plan: Home Medications- Robaxin; Home Treatments- Heat; return here if the recommended treatment, does not improve the symptoms; Recommended follow up- PCP prn    I personally performed the services described in this documentation, which was scribed in my presence. The recorded information has been reviewed and is accurate.       Richarda Blade, MD 12/03/13 2113

## 2013-12-05 ENCOUNTER — Telehealth: Payer: Self-pay | Admitting: Nurse Practitioner

## 2013-12-05 ENCOUNTER — Ambulatory Visit: Payer: 59 | Admitting: Nurse Practitioner

## 2013-12-05 NOTE — Telephone Encounter (Signed)
She may have costochondritis-inflammation of muscles between ribs. This will not show on CXR. Usually ibuprophen will help with this pain. She should schedule appt. If she wants to be treated.

## 2013-12-05 NOTE — Telephone Encounter (Signed)
Please advise 

## 2013-12-05 NOTE — Telephone Encounter (Signed)
Patient Information:  Caller Name: Folashade  Phone: [phone]  Patient: [name], [name]  Gender: Female  DOB: [birth year]  Age: 58 Years  PCP: Nicky Pugh  Office Follow Up:  Does the office need to follow up with this patient?: Yes  Instructions For The Office: Requests call back to advise follow up recommendation; pt cancelled appointment for 12/05/13 and refused 911.  RN Note:  Diagnosed with pneumonia 10/18/13.  Right rib chest pain is worse when lies down.  Pain rated 7/10 unless takes Percocet, left over from dental extraction.  Denies shortness of breath and diaphoresis.  Cancelled appointment 12/05/13 because chest X-ray was done in ED. Pt declined to call 911 or return to ED since just evaluated.  Asking why rib pain is still present after pneumonia resolved.  Please follow up with patient to advise  recommended follow up care since declined triage recommendation to call 911.  Symptoms  Reason For Call & Symptoms: Ongoing pain in right rib area under right breast.  Seen at [facility] ED 12/03/13.  Diagnosed with chest wall pain.  Chest xray showed hyperinflated lungs and scoliosis.  Reviewed Health History In EMR: Yes  Reviewed Medications In EMR: Yes  Reviewed Allergies In EMR: Yes  Reviewed Surgeries / Procedures: Yes  Date of Onset of Symptoms: 11/30/2013  Treatments Tried: Percocet  Treatments Tried Worked: Yes  Guideline(s) Used:  Chest Pain  Disposition Per Guideline:   Call EMS 911 Now  Reason For Disposition Reached:   Chest pain lasting longer than 5 minutes and ANY of the following:  Over 5 years old Over 67 years old and at least one cardiac risk factor (i.e., high blood pressure, diabetes, high cholesterol, obesity, smoker or strong family history of heart disease) Pain is crushing, pressure-like, or heavy  Took nitroglycerin and chest pain was not relieved History of heart disease (i.e., angina, heart attack, bypass surgery, angioplasty,  CHF)  Advice Given:  Call Back If:  Severe chest pain  Constant chest pain lasting longer than 5 minutes  Difficulty breathing  Fever  You become worse.  Patient Refused Recommendation:  Patient Will Follow Up With Office Later  Requests call back to advise follow up care for ongoing chest pain evaluated at ED 12/03/13.

## 2013-12-06 ENCOUNTER — Telehealth: Payer: Self-pay | Admitting: Nurse Practitioner

## 2013-12-06 NOTE — Telephone Encounter (Signed)
Did you want me to call patient?

## 2013-12-06 NOTE — Telephone Encounter (Signed)
Spoke w/pt-sounds like she may have costochondritis. Adv to take aleve twice daily with food. If no improvement, make appt for OV & I will prescribe prednisone. Discussed pt has mild COPD. Answered all questions.

## 2013-12-07 ENCOUNTER — Encounter: Payer: Self-pay | Admitting: Nurse Practitioner

## 2013-12-07 ENCOUNTER — Ambulatory Visit (INDEPENDENT_AMBULATORY_CARE_PROVIDER_SITE_OTHER): Payer: 59 | Admitting: Nurse Practitioner

## 2013-12-07 VITALS — BP 135/94 | HR 74 | Temp 98.2°F | Resp 18 | Ht 66.5 in | Wt 115.0 lb

## 2013-12-07 DIAGNOSIS — R454 Irritability and anger: Secondary | ICD-10-CM | POA: Diagnosis not present

## 2013-12-07 DIAGNOSIS — M1990 Unspecified osteoarthritis, unspecified site: Secondary | ICD-10-CM

## 2013-12-07 MED ORDER — CELECOXIB 200 MG PO CAPS: 200.0000 mg | ORAL_CAPSULE | Freq: Every day | ORAL | Status: DC

## 2013-12-07 NOTE — Patient Instructions (Signed)
Start celebrex. Take daily with meal  Return in 2 weeks to evaluate.  Please follow up with dr Cruzita Lederer.

## 2013-12-07 NOTE — Progress Notes (Signed)
Pre visit review using our clinic review tool, if applicable. No additional management support is needed unless otherwise documented below in the visit note. 

## 2013-12-08 DIAGNOSIS — M1990 Unspecified osteoarthritis, unspecified site: Secondary | ICD-10-CM | POA: Insufficient documentation

## 2013-12-08 DIAGNOSIS — R454 Irritability and anger: Secondary | ICD-10-CM | POA: Insufficient documentation

## 2013-12-08 NOTE — Progress Notes (Signed)
Subjective:     [name] is a 58 y.o. female presents w/mult complaints: "grand daughter gets on my nerves", anterior R rib pain when lying on back, both shoulders hurt, "whole body hurts". Percocet 2.5 mg makes pain go away-takes during day "so I can keep going, or pain makes me stop". Not taking at night. 1/4 xanax (unknown dose) helps nerves, but makes too sleepy to babysit.   [name] was treated for pneumonia about 1 mo. Ago. She coughed for many weeks, now resolved-possible has rib fracture that was missed on CXR or has costochondritis causing rib pain. She went to ER few days ago due to pain. W/u included CXR-pneumonia cleared, she has COPD. She was given muscle relaxer w/no relief.  She applied for disability in Sept. Due to multiple body aches: hands, wrist, shoulder, neck. She had rheum w/u few mos ago w/Dr Trudie Reed. Pt states she has no f/u appt. Will request notes. Her daughter & 2 grandchildren live w/her. She states this creates stress. She has been treated for anxiety in past-recalls intolerable SE to med-doesn't remember what she took.   She has elevated thyroid Ab w/ nml t4 & TSH. Endo eval included neck US-small nodule L lobe of thyroid. Pt was to repeat labs w/endo, she missed appt due to pneumonia. She is to f/u this month.   The following portions of the patient's history were reviewed and updated as appropriate: allergies, current medications, past medical history, past social history, past surgical history and problem list.  Review of Systems Constitutional: negative for fatigue, fevers and weight loss Ears, nose, mouth, throat, and face: negative for nasal congestion Respiratory: positive for pleurisy/chest pain and COPD, smoker , negative for cough, sputum and wheezing Integument/breast: negative for rash Musculoskeletal:positive for arthralgias and neck pain Behavioral/Psych: positive for irritability, sleep disturbance and tobacco use, negative for excessive  alcohol consumption    Objective:    BP 135/94 mmHg  Pulse 74  Temp(Src) 98.2 F (36.8 C) (Oral)  Resp 18  Ht 5' 6.5" (1.689 m)  Wt 115 lb (52.164 kg)  BMI 18.29 kg/m2  SpO2 97% BP 135/94 mmHg  Pulse 74  Temp(Src) 98.2 F (36.8 C) (Oral)  Resp 18  Ht 5' 6.5" (1.689 m)  Wt 115 lb (52.164 kg)  BMI 18.29 kg/m2  SpO2 97% General appearance: alert, cooperative, appears stated age, cachectic and mild distress Head: Normocephalic, without obvious abnormality, atraumatic Eyes: negative findings: lids and lashes normal, conjunctivae and sclerae normal and wears glasses Neck: no carotid bruit, supple, symmetrical, trachea midline and thyroid: normal to inspection and palpation and pt states she is tender over L lobe thyroid. No nodule palpable, but pt had US thyroid few mos ago revealing L lobe nodule-pt knows this. It was too small to biopsy. Back: tender between7-9 ribs R lateral & anterior to palpation Lungs: clear to auscultation bilaterally Heart: regular rate and rhythm, S1, S2 normal, no murmur, click, rub or gallop Lymph nodes: 1 cm NT mobile palpable posterior R cervical node    Assessment:Plan   1. Arthritis Multiple sites: neck, hands, shoulders, ribs, Thoracic spine between scapulae Prior w/u: ANA 1:320, C-s xray 2013:Mild narrowing C5-6 and C6-7 interspaces with small anterior and posterior endplate spurs. Normal alignment. No prevertebral soft tissue swelling. Negative for fracture.  Rheum eval: will request notes - celecoxib (CELEBREX) 200 MG capsule; Take 1 capsule (200 mg total) by mouth daily.  Dispense: 30 capsule; Refill: 0  2. Irritibility chrionic pain may be  contributor-starting clelbrex Continue xanax prn Discuss low dose anti-depressant at next OV.

## 2013-12-12 ENCOUNTER — Other Ambulatory Visit: Payer: Self-pay | Admitting: Nurse Practitioner

## 2013-12-12 DIAGNOSIS — M940 Chondrocostal junction syndrome [Tietze]: Secondary | ICD-10-CM

## 2013-12-12 MED ORDER — MELOXICAM 7.5 MG PO TABS: 7.5000 mg | ORAL_TABLET | Freq: Every day | ORAL | Status: DC | PRN

## 2013-12-12 NOTE — Telephone Encounter (Signed)
Spoke with pt, she states the medication helped but she cannot take anything with Sulfa in it. I advised her we would call in another medication. Pt understood.

## 2013-12-12 NOTE — Telephone Encounter (Signed)
pls ask if celebrex made rib pain better. If so, I will send in mobic. Please add celebrex to allergy list.

## 2013-12-12 NOTE — Telephone Encounter (Signed)
Pt called stating she couldn't take Celebrex, she only took it for 4 days. She developed hives all over and took Benadryl. Pt calling to let you know. Do you want to Rx something else. Please advise.

## 2013-12-21 ENCOUNTER — Ambulatory Visit (INDEPENDENT_AMBULATORY_CARE_PROVIDER_SITE_OTHER): Payer: 59 | Admitting: Nurse Practitioner

## 2013-12-21 ENCOUNTER — Encounter: Payer: Self-pay | Admitting: Nurse Practitioner

## 2013-12-21 VITALS — BP 148/88 | HR 70 | Temp 97.6°F | Ht 66.5 in | Wt 119.0 lb

## 2013-12-21 DIAGNOSIS — M1990 Unspecified osteoarthritis, unspecified site: Secondary | ICD-10-CM

## 2013-12-21 DIAGNOSIS — R030 Elevated blood-pressure reading, without diagnosis of hypertension: Secondary | ICD-10-CM | POA: Diagnosis not present

## 2013-12-21 DIAGNOSIS — R454 Irritability and anger: Secondary | ICD-10-CM | POA: Diagnosis not present

## 2013-12-21 DIAGNOSIS — R0982 Postnasal drip: Secondary | ICD-10-CM | POA: Insufficient documentation

## 2013-12-21 DIAGNOSIS — IMO0001 Reserved for inherently not codable concepts without codable children: Secondary | ICD-10-CM

## 2013-12-21 MED ORDER — LORAZEPAM 0.5 MG PO TABS: 0.5000 mg | ORAL_TABLET | Freq: Two times a day (BID) | ORAL | Status: DC | PRN

## 2013-12-21 MED ORDER — NAPROXEN-ESOMEPRAZOLE 500-20 MG PO TBEC: 1.0000 | DELAYED_RELEASE_TABLET | Freq: Every morning | ORAL | Status: DC

## 2013-12-21 NOTE — Patient Instructions (Signed)
Keep taking B12 daily.  Take blood pressure medicine.  Take mobic twice daily OR start new medicine-take once daily.  Start ativan for "nerves".  Start daily sinus rinses to decrease clearing of throat.  Take 81 mg aspirin daily to prevent stroke.  Return in 1 month to evaluate new medicines.  Merry Christmas!

## 2013-12-21 NOTE — Progress Notes (Signed)
Subjective:     [name] is a 58 y.o. female presents for f/u of arthrtis pain and "nerves". She has new problem of frequent clearing of throat. 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 I started celebrex last visit for polyarthralgia. Pt states worked well-no pain, but broke out in hives after 3 days. Had no pain for few days. Then pain returned. She started mobic, but states QD dosing does not help. Last OV she c/o [name] "getting on nerves". She takes [name]'s xanax-helps, but makes her go to sleep. Tried "something" in remote past for anxiety but made her "choke her [name]". Pain may be complicating "nerves".  Pt states has a lot of mucous in throat, clears throat a lot. Has not tried anything.  Not taking any other meds: BP high in offc today. Pt states she will start BP med again. She thinks pressure is high due to pain. Advised she needs to take B12 daily to prevent deficiency.  The following portions of the patient's history were reviewed and updated as appropriate: allergies, current medications, past family history, past medical history, past social history, past surgical history and problem list.  Review of Systems Constitutional: positive for constant pain "all over" Ears, nose, mouth, throat, and face: positive for nasal congestion, negative for earaches and sore throat Respiratory: negative for cough Integument/breast: negative for rash Musculoskeletal:positive for arthralgias, stiff joints and deformity Behavioral/Psych: positive for irritability, negative for aggressive behavior    Objective:    BP 148/88 mmHg  Pulse 70  Temp(Src) 97.6 F (36.4 C) (Temporal)  Ht 5' 6.5" (1.689 m)  Wt 119 lb (53.978 kg)  BMI 18.92 kg/m2  SpO2 99% BP 148/88 mmHg  Pulse 70  Temp(Src) 97.6 F (36.4 C) (Temporal)  Ht 5' 6.5" (1.689 m)  Wt 119 lb (53.978 kg)  BMI 18.92 kg/m2  SpO2 99% General appearance: alert, cooperative, appears stated age and no  distress Head: Normocephalic, without obvious abnormality, atraumatic Eyes: negative findings: lids and lashes normal and conjunctivae and sclerae normal Ears: normal TM's and external ear canals both ears Nose: moderate thick white discharge clinging to bilat nares Throat: lips, mucosa, and tongue normal; teeth and gums normal and upper dentures in place Lungs: clear to auscultation bilaterally Heart: regular rate and rhythm, S1, S2 normal, no murmur, click, rub or gallop Extremities: dip & pip joints bilat hands have nodules Skin: cold fingers Lymph nodes: posterior R cervical node palpable, NT about 0.5 cm, moveable     Assessment:Plan     1. Arthritis Had rheum w/u. No f/u ANA pos 1: 360, mother has RA & pulm fibrosis  - Naproxen-Esomeprazole 500-20 MG TBEC; Take 1 tablet by mouth every morning.  Dispense: 30 tablet; Refill: 1 Stop meloxicam 2. Irritability Pain may be contributing - LORazepam (ATIVAN) 0.5 MG tablet; Take 1 tablet (0.5 mg total) by mouth 2 (two) times daily as needed for anxiety.  Dispense: 30 tablet; Refill: 1  3. Elevated BP Noncompliance Pt instructed to Re-start med.  4. Postnasal drip Start daily sinus rinse  F/u 1 mo to review new arth med & new anxiety med Check kidney func if taking meds.  This visit has moderate risk & moderate complexity 25 minutes spent with patient

## 2013-12-21 NOTE — Assessment & Plan Note (Addendum)
Not taking lisinopril. Not at goal-135/85 Stressed importance of protecting kidney. Pt agrees to re-start.

## 2013-12-21 NOTE — Progress Notes (Signed)
Pre visit review using our clinic review tool, if applicable. No additional management support is needed unless otherwise documented below in the visit note. 

## 2013-12-22 ENCOUNTER — Ambulatory Visit (INDEPENDENT_AMBULATORY_CARE_PROVIDER_SITE_OTHER): Payer: 59 | Admitting: Internal Medicine

## 2013-12-22 ENCOUNTER — Telehealth: Payer: Self-pay | Admitting: Nurse Practitioner

## 2013-12-22 ENCOUNTER — Encounter: Payer: Self-pay | Admitting: Internal Medicine

## 2013-12-22 VITALS — BP 124/80 | HR 88 | Temp 97.9°F | Resp 12 | Wt 119.0 lb

## 2013-12-22 DIAGNOSIS — E061 Subacute thyroiditis: Secondary | ICD-10-CM | POA: Insufficient documentation

## 2013-12-22 DIAGNOSIS — E041 Nontoxic single thyroid nodule: Secondary | ICD-10-CM | POA: Diagnosis not present

## 2013-12-22 LAB — T4, FREE: Free T4: 1.01 ng/dL (ref 0.60–1.60)

## 2013-12-22 LAB — TSH: TSH: 2.81 u[IU]/mL (ref 0.35–4.50)

## 2013-12-22 LAB — T3, FREE: T3, Free: 3.2 pg/mL (ref 2.3–4.2)

## 2013-12-22 NOTE — Telephone Encounter (Signed)
emmi emailed °

## 2013-12-22 NOTE — Progress Notes (Addendum)
Patient ID: [name], female   DOB: Jan 27, 1955, 58 y.o.   MRN: [id number]   HPI  [name] is a 58 y.o.-year-old female, returning for follow-up for subacute thyroiditis.  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  She had PNA 6 weeks ago >> ABx >> better.  Reviewed hx: She describes that in 08/2013 >> woke up with severe arm pain >> went to the hospital >> injection of Dilaudid >> continued to have arm pain and tingling. She then saw PCP >> low B12 vitamin and low TSH. Started B12 injections.   I reviewed pt's thyroid tests - she had a low TSH 4 months ago, and then a high TSH 3 months ago. She did not return for repeat labs a month after the last thyroid tests were obtained: Lab Results  Component Value Date   TSH 13.89* 09/19/2013   TSH 0.06* 08/09/2013   FREET4 0.72 09/19/2013   FREET4 1.29 08/09/2013   Component     Latest Ref Rng 08/09/2013 09/19/2013  Thyroid Peroxidase Antibody     <9 IU/mL 27.1 26 (H)   Thyroid U/S (09/26/2013):  isoechoic nodule along the posterior right thyroid lobe that measures 1.1 x 1.2 x 0.9 cm. Vascularity of this nodule is similar to the adjacent thyroid parenchyma. No significant calcifications within this right thyroid nodule. Very small nodules in L lobe, also.  Pt denies feeling nodules in neck, hoarseness, + dysphagia (after taking Celebrex)/no odynophagia, SOB with lying down; she c/o: - pain in shoulders - no excessive sweating/heat intolerance; has cold intolerance - + weight loss: 115 >> 119 lb - no tremors - + anxiety >> on Ativan - no palpitations  - no fatigue - no hyperdefecation  I reviewed her chart and she also has a history of HTN, COPD, bradycardia.  ROS: Constitutional: + see HPI Eyes: no blurry vision, no xerophthalmia ENT: no sore throat, + nodule palpated in throat - post cervical, R side, no dysphagia/odynophagia, no hoarseness Cardiovascular: no  CP/SOB/palpitations/leg swelling Respiratory: no cough/SOB Gastrointestinal: no N/V/D/C Musculoskeletal: + all:  muscle/joint aches Skin: no rashes Neurological: no tremors/numbness/tingling/dizziness, + HA  I reviewed pt's medications, allergies, PMH, social hx, family hx, and changes were documented in the history of present illness. Otherwise, unchanged from my initial visit note.  PE: BP 124/80 mmHg  Pulse 88  Temp(Src) 97.9 F (36.6 C) (Oral)  Resp 12  Wt 119 lb (53.978 kg)  SpO2 98% Wt Readings from Last 3 Encounters:  12/22/13 119 lb (53.978 kg)  12/21/13 119 lb (53.978 kg)  12/07/13 115 lb (52.164 kg)   Constitutional: thin, in NAD Eyes: PERRLA, EOMI, no exophthalmos, no lid lag, no stare ENT: moist mucous membranes, no thyromegaly, no cervical lymphadenopathy Cardiovascular: RRR, No MRG Respiratory: CTA B Gastrointestinal: abdomen soft, NT, ND, BS+ Musculoskeletal: no deformities, strength intact in all 4 Skin: moist, warm, no rashes Neurological: no tremor with outstretched hands, DTR normal in all 4  ASSESSMENT: 1. Thyrotoxicosis - now with high TSH level  2. R thyroid nodule Thyroid U/S (09/26/2013):  Right thyroid lobe Measurements: 5.4 x 1.7 x 1.8 cm. Right thyroid tissue is mildly heterogeneous. There is an isoechoic nodule along the posterior right thyroid lobe that measures 1.1 x 1.2 x 0.9 cm. Vascularity of this nodule is similar to the adjacent thyroid parenchyma. No significant calcifications within this right thyroid nodule.  Left thyroid lobe  Measurements: 6.7 x 1.3 x 1.9 cm. Left thyroid tissue is mildly heterogeneous but very vascular. Small heterogeneous nodule in the inferior left thyroid lobe measures 0.3 cm.  Isthmus Thickness: 0.3 cm. No nodules visualized.  Lymphadenopathy None visualized.  PLAN:  1. Patient with a low TSH, which then increased to 13, 1 month later. We again discussed that this pattern of TFTs can be seen in  subacute thyroiditis, especially as her TPO Ab's are also high, but not very high. I - At last visit, we discussed to check the TSH, fT3 and fT4 in a month , but she did not return to have the labs done. She now returns 3 months after the last TSH level returned high. We will recheck these tests today. - We discussed about proper replacement with levothyroxine, if we need to start this medication when her results are back - I advised her to take the thyroid hormone every day, with water, >30 minutes before breakfast, separated by >4 hours from acid reflux medications, calcium, iron, multivitamins. - I will send her the lab results through [facility] in 6 months, but sooner for repeat labs  2. R thyroid nodule  - 1.2 cm on thyroid U/S - We reviewed the results of her previous thyroid ultrasound from 09/2013, and we decided to obtain another ultrasound in a year or 2 from the previous since her nodule is so small and does not have worrisome structural features  Office Visit on 12/22/2013  Component Date Value Ref Range Status  . TSH 12/22/2013 2.81  0.35 - 4.50 uIU/mL Final  . Free T4 12/22/2013 1.01  0.60 - 1.60 ng/dL Final  . T3, Free 12/22/2013 3.2  2.3 - 4.2 pg/mL Final   Dear Ms [name], Your thyroid tests are now normal, which is great. This shows that you had an episode of thyroiditis, which has resolved, as we suspected before. I will recheck your thyroid tests when you come back to see me in 6 months.  Sincerely, Philemon Kingdom MD

## 2013-12-22 NOTE — Addendum Note (Signed)
Addended by: Philemon Kingdom on: 12/22/2013 11:59 AM   Modules accepted: Level of Service

## 2013-12-22 NOTE — Patient Instructions (Signed)
Please stop at the lab.  If we need to start levothyroxine, take the thyroid hormone every day, with water, >30 minutes before breakfast, separated by >4 hours from acid reflux medications, calcium, iron, multivitamins.  Please come back for a follow-up appointment in 6 months.

## 2014-01-18 ENCOUNTER — Ambulatory Visit (INDEPENDENT_AMBULATORY_CARE_PROVIDER_SITE_OTHER): Payer: 59 | Admitting: Nurse Practitioner

## 2014-01-18 ENCOUNTER — Telehealth: Payer: Self-pay | Admitting: Nurse Practitioner

## 2014-01-18 ENCOUNTER — Encounter: Payer: Self-pay | Admitting: Nurse Practitioner

## 2014-01-18 VITALS — BP 159/104 | HR 81 | Temp 97.5°F | Ht 66.5 in | Wt 120.0 lb

## 2014-01-18 DIAGNOSIS — I10 Essential (primary) hypertension: Secondary | ICD-10-CM

## 2014-01-18 DIAGNOSIS — Z791 Long term (current) use of non-steroidal anti-inflammatories (NSAID): Secondary | ICD-10-CM

## 2014-01-18 DIAGNOSIS — M25511 Pain in right shoulder: Secondary | ICD-10-CM | POA: Diagnosis not present

## 2014-01-18 DIAGNOSIS — R634 Abnormal weight loss: Secondary | ICD-10-CM | POA: Diagnosis not present

## 2014-01-18 LAB — BASIC METABOLIC PANEL
BUN: 20 mg/dL (ref 6–23)
CALCIUM: 10.1 mg/dL (ref 8.4–10.5)
CO2: 34 meq/L — AB (ref 19–32)
CREATININE: 0.97 mg/dL (ref 0.40–1.20)
Chloride: 106 mEq/L (ref 96–112)
GFR: 62.38 mL/min (ref >60.00)
GLUCOSE: 79 mg/dL (ref 70–99)
Potassium: 4.3 mEq/L (ref 3.5–5.1)
Sodium: 142 mEq/L (ref 135–145)

## 2014-01-18 LAB — HEMOGLOBIN A1C: Hgb A1c MFr Bld: 5.5 % (ref 4.6–6.5)

## 2014-01-18 MED ORDER — LISINOPRIL 20 MG PO TABS: 20.0000 mg | ORAL_TABLET | Freq: Every day | ORAL | Status: DC

## 2014-01-18 MED ORDER — MELOXICAM 7.5 MG PO TABS: 7.5000 mg | ORAL_TABLET | Freq: Every day | ORAL | Status: DC | PRN
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
## 2014-01-18 NOTE — Progress Notes (Signed)
Pre visit review using our clinic review tool, if applicable. No additional management support is needed unless otherwise documented below in the visit note. 

## 2014-01-18 NOTE — Telephone Encounter (Signed)
pls call pt: Advise Kidney function is fine & she does not have diabetes

## 2014-01-18 NOTE — Patient Instructions (Signed)
Please see orthopedist for shoulder pain.  Please increase lisinopril to 20 mg daily.   Continue to use voltaren gel on shoulder.   Take meloxicam when needed for arthritis.  Continue to use ativan as needed.  See me in 4 weeks to check blood pressure.

## 2014-01-19 NOTE — Telephone Encounter (Signed)
LMOVM for pt to return call 

## 2014-01-19 NOTE — Telephone Encounter (Signed)
Patient notified of results.

## 2014-02-14 ENCOUNTER — Telehealth: Payer: Self-pay | Admitting: Nurse Practitioner

## 2014-02-14 NOTE — Telephone Encounter (Signed)
Noted  

## 2014-02-14 NOTE — Telephone Encounter (Signed)
Pt called to let Layne know that her BP meds were working. She cancelled tomorrow appt. After her orthopedic visit on the 15th she will call and reschedule.

## 2014-02-15 ENCOUNTER — Ambulatory Visit: Payer: 59 | Admitting: Nurse Practitioner

## 2014-02-22 ENCOUNTER — Other Ambulatory Visit: Payer: Self-pay | Admitting: Orthopaedic Surgery

## 2014-02-22 DIAGNOSIS — M25511 Pain in right shoulder: Secondary | ICD-10-CM

## 2014-03-04 ENCOUNTER — Ambulatory Visit
Admission: RE | Admit: 2014-03-04 | Discharge: 2014-03-04 | Disposition: A | Discharge status: Home / Self Care | Payer: 59 | Source: Ambulatory Visit | Attending: Orthopaedic Surgery | Admitting: Orthopaedic Surgery

## 2014-03-04 DIAGNOSIS — M25511 Pain in right shoulder: Secondary | ICD-10-CM

## 2014-03-24 ENCOUNTER — Telehealth: Payer: Self-pay | Admitting: Nurse Practitioner

## 2014-03-24 NOTE — Telephone Encounter (Signed)
Called and spoke with patient. Informed her that she needed an appointment. But she insisted that she has already talked to you about the constant cough. Patient did make an appointment for 03/28/14 at 10:30a

## 2014-03-24 NOTE — Telephone Encounter (Signed)
Pt. Called and would like a referral to an ENT because she can not stand the coughing, sore throat, all the time. She states she has to sleep in a chair in order to get rest. Please advise

## 2014-03-24 NOTE — Telephone Encounter (Signed)
Please call pt. She will need to schedule appt. I am unaware of her constant coughing & sore throat.

## 2014-03-24 NOTE — Telephone Encounter (Signed)
Please advise 

## 2014-03-28 ENCOUNTER — Encounter: Payer: Self-pay | Admitting: Nurse Practitioner

## 2014-03-28 ENCOUNTER — Ambulatory Visit (INDEPENDENT_AMBULATORY_CARE_PROVIDER_SITE_OTHER): Payer: 59 | Admitting: Nurse Practitioner

## 2014-03-28 VITALS — BP 134/80 | HR 70 | Temp 97.6°F | Ht 66.5 in | Wt 120.0 lb

## 2014-03-28 DIAGNOSIS — R05 Cough: Secondary | ICD-10-CM | POA: Diagnosis not present

## 2014-03-28 DIAGNOSIS — J0100 Acute maxillary sinusitis, unspecified: Secondary | ICD-10-CM | POA: Diagnosis not present

## 2014-03-28 DIAGNOSIS — R059 Cough, unspecified: Secondary | ICD-10-CM | POA: Insufficient documentation

## 2014-03-28 MED ORDER — AMOXICILLIN-POT CLAVULANATE 875-125 MG PO TABS: 1.0000 | ORAL_TABLET | Freq: Two times a day (BID) | ORAL | Status: DC

## 2014-03-28 MED ORDER — AZELASTINE HCL 0.1 % NA SOLN: 1.0000 | Freq: Two times a day (BID) | NASAL | Status: DC

## 2014-03-28 NOTE — Progress Notes (Signed)
Pre visit review using our clinic review tool, if applicable. No additional management support is needed unless otherwise documented below in the visit note. 

## 2014-03-28 NOTE — Progress Notes (Signed)
Subjective:     [name] is a 59 y.o. female who presents for evaluation of nonproductive cough. Symptoms began a few mos ago after taking celebrex & had allergic reaction. Symptoms have been somewhat improved since starting sinus rinses & decreasing cigarettes-she is down to 3 cigs daily & uses e-cig. Past history is significant for COPD and smoker, allergies. She is taking lisinopril for BP. Cough is associated with "tickle" in throat, hoarse voice. Symptoms worse when lays down-has to sit up in chair to not cough. She is taking allegra daily X 2 yrs without relief.  I discussed various common causes of cough including allergy, cig smoke irritation, lisinopril, reflux, post-nasal drip.  The following portions of the patient's history were reviewed and updated as appropriate: allergies, current medications, past medical history, past social history, past surgical history and problem list.  Review of Systems Ears, nose, mouth, throat, and face: negative for nasal congestion and sore throat Respiratory: negative for dyspnea on exertion and sputum Cardiovascular: negative for chest pressure/discomfort and irregular heart beat Gastrointestinal: negative for dyspepsia, nausea and reflux symptoms    Objective:    BP 134/80 mmHg  Pulse 70  Temp(Src) 97.6 F (36.4 C) (Oral)  Ht 5' 6.5" (1.689 m)  Wt 120 lb (54.432 kg)  BMI 19.08 kg/m2  SpO2 100% General appearance: alert, cooperative, appears stated age and no distress Head: Normocephalic, without obvious abnormality, atraumatic Eyes: negative findings: lids and lashes normal, conjunctivae and sclerae normal and wears glasses Ears: normal TM's and external ear canals both ears Nose: scant white d/c L nare. Started coughing after tilted head back.   Throat: lips, mucosa, and tongue normal; teeth and gums normal Lungs: clear to auscultation bilaterally Heart: regular rate and rhythm, S1, S2 normal, no murmur, click, rub or  gallop Neurologic: Grossly normal    Assessment:Plan  1. Acute maxillary sinusitis, recurrence not specified - amoxicillin-clavulanate (AUGMENTIN) 875-125 MG per tablet; Take 1 tablet by mouth 2 (two) times daily.  Dispense: 10 tablet; Refill: 0 - azelastine (ASTELIN) 0.1 % nasal spray; Place 1 spray into both nostrils 2 (two) times daily. Use in each nostril as directed  Dispense: 30 mL; Refill: 12  2.cough DD: reflux, allergy, post-nasal drip, SE lisinopril tx for sinusitis Start nasal antihistamine Stop allegra Continue sinus rinse F/u 3 weeks-if no improvement, d/c lisinopril,

## 2014-03-28 NOTE — Patient Instructions (Signed)
Continue daily sinus rinse. Stop allegra & start azelastine nasal spray.  Start antibiotic. Eat yogurt daily to help prevent antibiotic -associated diarrhea  Return in 3 weeks to evaluate.

## 2014-03-30 ENCOUNTER — Telehealth: Payer: Self-pay | Admitting: Nurse Practitioner

## 2014-03-30 NOTE — Telephone Encounter (Signed)
Please Advise? She was seen in office 03/28/14

## 2014-03-30 NOTE — Telephone Encounter (Signed)
Patient is still having the coughing issues so she is thinking it may be her bp med. She is having surgery 04/25/14 so she wants to take care of this before her surgery. Please call patient. I advised patient that we are closed tomorrow so she will get a CB Mon 04/03/14. Patient okay with waiting.

## 2014-04-03 ENCOUNTER — Other Ambulatory Visit: Payer: Self-pay | Admitting: Nurse Practitioner

## 2014-04-03 DIAGNOSIS — I10 Essential (primary) hypertension: Secondary | ICD-10-CM

## 2014-04-03 MED ORDER — AMLODIPINE BESYLATE 5 MG PO TABS: 5.0000 mg | ORAL_TABLET | Freq: Every day | ORAL | Status: DC

## 2014-04-03 NOTE — Telephone Encounter (Signed)
pls call pt: Advise I sent new script to pharmacy for BP med. Sched OV in 10 days.

## 2014-04-03 NOTE — Telephone Encounter (Signed)
Called and informed patient. Patient stated that she stopped coughing after she stopped taking her BP Meds. Had an appt scheduled for 04/18/14 and wanted to keep that appt and not change it too sooner.

## 2014-04-10 ENCOUNTER — Telehealth: Payer: Self-pay | Admitting: Nurse Practitioner

## 2014-04-10 NOTE — Telephone Encounter (Signed)
Please Advise

## 2014-04-10 NOTE — Telephone Encounter (Signed)
Called and informed patient. Told patient to call with any questions or concerns

## 2014-04-10 NOTE — Telephone Encounter (Signed)
Patient is still coughing a lot. She is using the nasal spray & is taking the new bp med. Is there anything else she can do? Please call her.

## 2014-04-10 NOTE — Telephone Encounter (Signed)
Cough may be r/t reflux. Start 20 mg pepcid daily. If no improvement in 10 days, I will refer to pulmonology.
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
## 2014-04-18 ENCOUNTER — Encounter: Payer: Self-pay | Admitting: Nurse Practitioner

## 2014-04-18 ENCOUNTER — Ambulatory Visit (INDEPENDENT_AMBULATORY_CARE_PROVIDER_SITE_OTHER): Payer: 59 | Admitting: Nurse Practitioner

## 2014-04-18 VITALS — BP 145/90 | HR 84 | Temp 97.8°F | Ht 66.5 in | Wt 119.0 lb

## 2014-04-18 DIAGNOSIS — I10 Essential (primary) hypertension: Secondary | ICD-10-CM | POA: Diagnosis not present

## 2014-04-18 DIAGNOSIS — R05 Cough: Secondary | ICD-10-CM | POA: Diagnosis not present

## 2014-04-18 DIAGNOSIS — R059 Cough, unspecified: Secondary | ICD-10-CM

## 2014-04-18 DIAGNOSIS — F5102 Adjustment insomnia: Secondary | ICD-10-CM | POA: Diagnosis not present

## 2014-04-18 MED ORDER — OMEPRAZOLE 20 MG PO CPDR: 20.0000 mg | DELAYED_RELEASE_CAPSULE | Freq: Every day | ORAL | Status: DC

## 2014-04-18 MED ORDER — AMLODIPINE BESYLATE 10 MG PO TABS: 10.0000 mg | ORAL_TABLET | Freq: Every day | ORAL | Status: DC

## 2014-04-18 NOTE — Progress Notes (Signed)
Subjective:     [name] is a 59 y.o. female presents for f/u cough. BP elevated today, pt mentions she is having trouble sleeping due to family troubles & started taking ativan qhs again.  Cough: Onset 6 mos since she had pneumonia. Cough has improved, but persistent when lies down. Pt feels cough is r/t "deviated nasal septum". Pt describes feeling "tickle in throat" then starts coughing until she gags. Sometimes vomits. Cough is occasionally productive. Nasal drainage is clear. Denies ear pain & sinus HA. We have tried few things: started nasal antihistamine & sinus washes-mild improvement; Stopped lisinopril, started norvasc-initially pt said cough improved, then called ofc few weeks later & said no better. She started pepcid 20 mg qd. Pt has done this for 2 wks w/no relief. She denies reflux, water brash. She wonders if she should see ENT & made appt for next week. She will try omeprazole for 1 mo. I discussed increasing norvasc. Ativan helps her to sleep. She took 1 T last 3 nights. Does not need refill. She has shoulder surgery scheduled next week. Dr [name].  The following portions of the patient's history were reviewed and updated as appropriate: allergies, current medications, past medical history, past social history, past surgical history and problem list.  Review of Systems Pertinent items are noted in HPI.    Objective:    BP 145/90 mmHg  Pulse 84  Temp(Src) 97.8 F (36.6 C) (Oral)  Ht 5' 6.5" (1.689 m)  Wt 119 lb (53.978 kg)  BMI 18.92 kg/m2  SpO2 98% BP 145/90 mmHg  Pulse 84  Temp(Src) 97.8 F (36.6 C) (Oral)  Ht 5' 6.5" (1.689 m)  Wt 119 lb (53.978 kg)  BMI 18.92 kg/m2  SpO2 98% General appearance: alert, cooperative, appears stated age and no distress Head: Normocephalic, without obvious abnormality, atraumatic Eyes: negative findings: lids and lashes normal and conjunctivae and sclerae normal Ears: normal TM's and external ear canals both ears Nose:  Nares normal. Septum midline. Mucosa normal. No drainage or sinus tenderness. Throat: lips, mucosa, and tongue normal; teeth and gums normal Neck: no adenopathy, supple, symmetrical, trachea midline and thyroid not enlarged, symmetric, no tenderness/mass/nodules Lungs: clear to auscultation bilaterally Heart: regular rate and rhythm, S1, S2 normal, no murmur, click, rub or gallop Neurologic: Grossly normal    Assessment:Plan  1. Essential hypertension, benign Increase dose - amLODipine (NORVASC) 10 MG tablet; Take 1 tablet (10 mg total) by mouth daily.  Dispense: 30 tablet; Refill: 1  2. Cough start - omeprazole (PRILOSEC) 20 MG capsule; Take 1 capsule (20 mg total) by mouth daily.  Dispense: 30 capsule; Refill: 0 Stop pepcid See ENT if desire  3. Insomnia due to stress Continue ativan PRN QHS  F/u 1 mo-cough, omeprazole

## 2014-04-18 NOTE — Progress Notes (Signed)
Pre visit review using our clinic review tool, if applicable. No additional management support is needed unless otherwise documented below in the visit note. 

## 2014-04-18 NOTE — Patient Instructions (Signed)
Stop pepcid. Start omeprazole. Take daily for 1 month.  Continue ativan at bedtime as needed.  Increase norvasc to 10 mg daily.  See me in 1 month.  Good luck with surgery!

## 2014-04-19 ENCOUNTER — Encounter (HOSPITAL_BASED_OUTPATIENT_CLINIC_OR_DEPARTMENT_OTHER): Payer: Self-pay | Admitting: *Deleted

## 2014-04-19 NOTE — Progress Notes (Signed)
Will go to [facility] for b[facility]-ekg done 8/15-chronic cough-

## 2014-04-21 ENCOUNTER — Encounter (HOSPITAL_COMMUNITY)
Admission: RE | Admit: 2014-04-21 | Discharge: 2014-04-21 | Disposition: A | Discharge status: Home / Self Care | Payer: 59 | Source: Ambulatory Visit | Attending: Orthopaedic Surgery | Admitting: Orthopaedic Surgery

## 2014-04-21 DIAGNOSIS — Z01812 Encounter for preprocedural laboratory examination: Secondary | ICD-10-CM | POA: Diagnosis not present

## 2014-04-21 LAB — BASIC METABOLIC PANEL
Anion gap: 7 (ref 5–15)
BUN: 21 mg/dL (ref 6–23)
CHLORIDE: 104 mmol/L (ref 96–112)
CO2: 30 mmol/L (ref 19–32)
CREATININE: 1.09 mg/dL (ref 0.50–1.10)
Calcium: 10 mg/dL (ref 8.4–10.5)
GFR calc Af Amer: 63 mL/min — ABNORMAL LOW (ref >90)
GFR, EST NON AFRICAN AMERICAN: 54 mL/min — AB (ref >90)
Glucose, Bld: 79 mg/dL (ref 70–99)
POTASSIUM: 4.9 mmol/L (ref 3.5–5.1)
Sodium: 141 mmol/L (ref 135–145)

## 2014-04-25 ENCOUNTER — Ambulatory Visit (HOSPITAL_BASED_OUTPATIENT_CLINIC_OR_DEPARTMENT_OTHER): Payer: 59 | Admitting: Anesthesiology

## 2014-04-25 ENCOUNTER — Ambulatory Visit (HOSPITAL_BASED_OUTPATIENT_CLINIC_OR_DEPARTMENT_OTHER)
Admission: RE | Admit: 2014-04-25 | Discharge: 2014-04-25 | Disposition: A | Discharge status: Home / Self Care | Payer: 59 | Source: Ambulatory Visit | Attending: Orthopaedic Surgery | Admitting: Orthopaedic Surgery

## 2014-04-25 ENCOUNTER — Encounter (HOSPITAL_BASED_OUTPATIENT_CLINIC_OR_DEPARTMENT_OTHER)
Admission: RE | Disposition: A | Discharge status: Home / Self Care | Payer: Self-pay | Source: Ambulatory Visit | Attending: Orthopaedic Surgery

## 2014-04-25 ENCOUNTER — Encounter (HOSPITAL_BASED_OUTPATIENT_CLINIC_OR_DEPARTMENT_OTHER): Payer: Self-pay | Admitting: *Deleted

## 2014-04-25 DIAGNOSIS — F17210 Nicotine dependence, cigarettes, uncomplicated: Secondary | ICD-10-CM | POA: Diagnosis not present

## 2014-04-25 DIAGNOSIS — M19011 Primary osteoarthritis, right shoulder: Secondary | ICD-10-CM | POA: Diagnosis not present

## 2014-04-25 DIAGNOSIS — J449 Chronic obstructive pulmonary disease, unspecified: Secondary | ICD-10-CM | POA: Diagnosis not present

## 2014-04-25 DIAGNOSIS — M75101 Unspecified rotator cuff tear or rupture of right shoulder, not specified as traumatic: Secondary | ICD-10-CM | POA: Diagnosis not present

## 2014-04-25 DIAGNOSIS — M7551 Bursitis of right shoulder: Secondary | ICD-10-CM | POA: Insufficient documentation

## 2014-04-25 DIAGNOSIS — M65811 Other synovitis and tenosynovitis, right shoulder: Secondary | ICD-10-CM | POA: Diagnosis not present

## 2014-04-25 DIAGNOSIS — F17200 Nicotine dependence, unspecified, uncomplicated: Secondary | ICD-10-CM | POA: Diagnosis not present

## 2014-04-25 DIAGNOSIS — M25811 Other specified joint disorders, right shoulder: Secondary | ICD-10-CM | POA: Insufficient documentation

## 2014-04-25 DIAGNOSIS — G43909 Migraine, unspecified, not intractable, without status migrainosus: Secondary | ICD-10-CM | POA: Insufficient documentation

## 2014-04-25 DIAGNOSIS — F5102 Adjustment insomnia: Secondary | ICD-10-CM | POA: Insufficient documentation

## 2014-04-25 DIAGNOSIS — L409 Psoriasis, unspecified: Secondary | ICD-10-CM | POA: Insufficient documentation

## 2014-04-25 DIAGNOSIS — M25511 Pain in right shoulder: Secondary | ICD-10-CM | POA: Diagnosis present

## 2014-04-25 DIAGNOSIS — F419 Anxiety disorder, unspecified: Secondary | ICD-10-CM | POA: Diagnosis not present

## 2014-04-25 DIAGNOSIS — Z79899 Other long term (current) drug therapy: Secondary | ICD-10-CM | POA: Diagnosis not present

## 2014-04-25 DIAGNOSIS — M7541 Impingement syndrome of right shoulder: Secondary | ICD-10-CM | POA: Diagnosis present

## 2014-04-25 DIAGNOSIS — M19019 Primary osteoarthritis, unspecified shoulder: Secondary | ICD-10-CM | POA: Diagnosis present

## 2014-04-25 HISTORY — PX: SUBACROMIAL DECOMPRESSION: SHX5174

## 2014-04-25 HISTORY — PX: SHOULDER ARTHROSCOPY WITH OPEN ROTATOR CUFF REPAIR AND DISTAL CLAVICLE ACROMINECTOMY: SHX5683

## 2014-04-25 LAB — POCT HEMOGLOBIN-HEMACUE: Hemoglobin: 15.5 g/dL — ABNORMAL HIGH (ref 12.0–15.0)

## 2014-04-25 SURGERY — SHOULDER ARTHROSCOPY WITH OPEN ROTATOR CUFF REPAIR AND DISTAL CLAVICLE ACROMINECTOMY
Anesthesia: General | Site: Shoulder | Laterality: Right

## 2014-04-25 MED ORDER — MIDAZOLAM HCL 2 MG/2ML IJ SOLN
INTRAMUSCULAR | Status: AC
  Filled 2014-04-25: qty 2

## 2014-04-25 MED ORDER — LIDOCAINE HCL (CARDIAC) 20 MG/ML IV SOLN
INTRAVENOUS | Status: DC | PRN
  Administered 2014-04-25: 30 mg via INTRAVENOUS

## 2014-04-25 MED ORDER — FENTANYL CITRATE (PF) 100 MCG/2ML IJ SOLN
INTRAMUSCULAR | Status: AC
  Filled 2014-04-25: qty 2

## 2014-04-25 MED ORDER — ACETAMINOPHEN 10 MG/ML IV SOLN
INTRAVENOUS | Status: AC
  Filled 2014-04-25: qty 100

## 2014-04-25 MED ORDER — BUPIVACAINE-EPINEPHRINE (PF) 0.5% -1:200000 IJ SOLN
INTRAMUSCULAR | Status: DC | PRN
  Administered 2014-04-25: 23 mL via PERINEURAL

## 2014-04-25 MED ORDER — PROPOFOL 10 MG/ML IV BOLUS
INTRAVENOUS | Status: DC | PRN
  Administered 2014-04-25: 150 mg via INTRAVENOUS

## 2014-04-25 MED ORDER — SUCCINYLCHOLINE CHLORIDE 20 MG/ML IJ SOLN
INTRAMUSCULAR | Status: AC
  Filled 2014-04-25: qty 1

## 2014-04-25 MED ORDER — SODIUM CHLORIDE 0.9 % IV SOLN: 75.0000 mL/h | INTRAVENOUS | Status: DC

## 2014-04-25 MED ORDER — SUCCINYLCHOLINE CHLORIDE 20 MG/ML IJ SOLN
INTRAMUSCULAR | Status: DC | PRN
  Administered 2014-04-25: 100 mg via INTRAVENOUS

## 2014-04-25 MED ORDER — PROPOFOL 10 MG/ML IV BOLUS
INTRAVENOUS | Status: AC
  Filled 2014-04-25: qty 20

## 2014-04-25 MED ORDER — CEFAZOLIN SODIUM-DEXTROSE 2-3 GM-% IV SOLR
INTRAVENOUS | Status: AC
  Filled 2014-04-25: qty 50

## 2014-04-25 MED ORDER — ONDANSETRON HCL 4 MG/2ML IJ SOLN
INTRAMUSCULAR | Status: DC | PRN
  Administered 2014-04-25: 4 mg via INTRAVENOUS

## 2014-04-25 MED ORDER — ACETAMINOPHEN 10 MG/ML IV SOLN
1000.0000 mg | Freq: Once | INTRAVENOUS | Status: AC
  Administered 2014-04-25: 1000 mg via INTRAVENOUS

## 2014-04-25 MED ORDER — EPHEDRINE SULFATE 50 MG/ML IJ SOLN
INTRAMUSCULAR | Status: DC | PRN
  Administered 2014-04-25: 15 mg via INTRAVENOUS
  Administered 2014-04-25: 10 mg via INTRAVENOUS

## 2014-04-25 MED ORDER — FENTANYL CITRATE 0.05 MG/ML IJ SOLN
50.0000 ug | INTRAMUSCULAR | Status: DC | PRN
  Administered 2014-04-25: 100 ug via INTRAVENOUS

## 2014-04-25 MED ORDER — LACTATED RINGERS IV SOLN
INTRAVENOUS | Status: DC
  Administered 2014-04-25 (×3): via INTRAVENOUS

## 2014-04-25 MED ORDER — SODIUM CHLORIDE 0.9 % IR SOLN
Status: DC | PRN
  Administered 2014-04-25: 9000 mL
  Administered 2014-04-25: 6000 mL

## 2014-04-25 MED ORDER — MIDAZOLAM HCL 2 MG/2ML IJ SOLN
1.0000 mg | INTRAMUSCULAR | Status: DC | PRN
  Administered 2014-04-25: 2 mg via INTRAVENOUS

## 2014-04-25 MED ORDER — CEFAZOLIN SODIUM-DEXTROSE 2-3 GM-% IV SOLR: 2.0000 g | INTRAVENOUS | Status: DC

## 2014-04-25 MED ORDER — DEXAMETHASONE SODIUM PHOSPHATE 4 MG/ML IJ SOLN
INTRAMUSCULAR | Status: DC | PRN
  Administered 2014-04-25: 10 mg via INTRAVENOUS

## 2014-04-25 MED ORDER — OXYCODONE HCL 5 MG PO TABS: 5.0000 mg | ORAL_TABLET | ORAL | Status: DC | PRN

## 2014-04-25 MED ORDER — CHLORHEXIDINE GLUCONATE 4 % EX LIQD: 60.0000 mL | Freq: Once | CUTANEOUS | Status: DC

## 2014-04-25 SURGICAL SUPPLY — 82 items
APL SKNCLS STERI-STRIP NONHPOA (GAUZE/BANDAGES/DRESSINGS)
BAG DECANTER FOR FLEXI CONT (MISCELLANEOUS) IMPLANT
BENZOIN TINCTURE PRP APPL 2/3 (GAUZE/BANDAGES/DRESSINGS) IMPLANT
BLADE 4.2CUDA (BLADE) ×4 IMPLANT
BLADE AVERAGE 25MMX9MM (BLADE)
BLADE AVERAGE 25X9 (BLADE) IMPLANT
BLADE CUDA 5.5 (BLADE) IMPLANT
BLADE GREAT WHITE 4.2 (BLADE) IMPLANT
BLADE GREAT WHITE 4.2MM (BLADE)
BLADE SURG 15 STRL LF DISP TIS (BLADE) IMPLANT
BLADE SURG 15 STRL SS (BLADE)
BUR OVAL 6.0 (BURR) ×4 IMPLANT
CANNULA 5.75X71 LONG (CANNULA) IMPLANT
CANNULA ACUFLEX KIT 5X76 (CANNULA) ×4 IMPLANT
CANNULA TWIST IN 8.25X7CM (CANNULA) IMPLANT
CLEANER CAUTERY TIP 5X5 PAD (MISCELLANEOUS) IMPLANT
CLOSURE WOUND 1/2 X4 (GAUZE/BANDAGES/DRESSINGS) ×1
CUTTER MENISCUS  4.2MM (BLADE)
CUTTER MENISCUS 4.2MM (BLADE) IMPLANT
DECANTER SPIKE VIAL GLASS SM (MISCELLANEOUS) IMPLANT
DRAPE SHOULDER BEACH CHAIR (DRAPES) ×4 IMPLANT
DRAPE SURG 17X23 STRL (DRAPES) ×4 IMPLANT
DRAPE U-SHAPE 47X51 STRL (DRAPES) ×2 IMPLANT
DRSG EMULSION OIL 3X3 NADH (GAUZE/BANDAGES/DRESSINGS) ×4 IMPLANT
DRSG PAD ABDOMINAL 8X10 ST (GAUZE/BANDAGES/DRESSINGS) ×4 IMPLANT
DURAPREP 26ML APPLICATOR (WOUND CARE) ×4 IMPLANT
ELECT NDL TIP 2.8 STRL (NEEDLE) IMPLANT
ELECT NEEDLE TIP 2.8 STRL (NEEDLE) ×4 IMPLANT
ELECT REM PT RETURN 9FT ADLT (ELECTROSURGICAL) ×4
ELECTRODE REM PT RTRN 9FT ADLT (ELECTROSURGICAL) ×2 IMPLANT
GAUZE SPONGE 4X4 12PLY STRL (GAUZE/BANDAGES/DRESSINGS) ×4 IMPLANT
GAUZE SPONGE 4X4 16PLY XRAY LF (GAUZE/BANDAGES/DRESSINGS) ×2 IMPLANT
GLOVE BIO SURGEON STRL SZ8.5 (GLOVE) ×2 IMPLANT
GLOVE BIOGEL PI IND STRL 8 (GLOVE) IMPLANT
GLOVE BIOGEL PI IND STRL 8.5 (GLOVE) IMPLANT
GLOVE BIOGEL PI INDICATOR 8 (GLOVE) ×2
GLOVE BIOGEL PI INDICATOR 8.5 (GLOVE) ×2
GLOVE ECLIPSE 8.0 STRL XLNG CF (GLOVE) ×4 IMPLANT
GOWN STRL REUS W/ TWL LRG LVL3 (GOWN DISPOSABLE) ×2 IMPLANT
GOWN STRL REUS W/ TWL XL LVL3 (GOWN DISPOSABLE) ×2 IMPLANT
GOWN STRL REUS W/TWL 2XL LVL3 (GOWN DISPOSABLE) ×2 IMPLANT
GOWN STRL REUS W/TWL LRG LVL3 (GOWN DISPOSABLE) ×4
GOWN STRL REUS W/TWL XL LVL3 (GOWN DISPOSABLE) ×4
MANIFOLD NEPTUNE II (INSTRUMENTS) ×2 IMPLANT
NDL 1/2 CIR CATGUT .05X1.09 (NEEDLE) IMPLANT
NDL SCORPION MULTI FIRE (NEEDLE) IMPLANT
NEEDLE 1/2 CIR CATGUT .05X1.09 (NEEDLE) IMPLANT
NEEDLE SCORPION MULTI FIRE (NEEDLE) IMPLANT
NS IRRIG 1000ML POUR BTL (IV SOLUTION) IMPLANT
PACK ARTHROSCOPY DSU (CUSTOM PROCEDURE TRAY) ×4 IMPLANT
PACK BASIN DAY SURGERY FS (CUSTOM PROCEDURE TRAY) ×4 IMPLANT
PAD CLEANER CAUTERY TIP 5X5 (MISCELLANEOUS)
PENCIL BUTTON HOLSTER BLD 10FT (ELECTRODE) ×2 IMPLANT
SET ARTHROSCOPY TUBING (MISCELLANEOUS) ×4
SET ARTHROSCOPY TUBING LN (MISCELLANEOUS) ×2 IMPLANT
SLEEVE SCD COMPRESS KNEE MED (MISCELLANEOUS) ×4 IMPLANT
SLING ARM LRG ADULT FOAM STRAP (SOFTGOODS) IMPLANT
SLING ARM MED ADULT FOAM STRAP (SOFTGOODS) IMPLANT
SPONGE LAP 4X18 X RAY DECT (DISPOSABLE) IMPLANT
STAPLER VISISTAT 35W (STAPLE) IMPLANT
STRIP CLOSURE SKIN 1/2X4 (GAUZE/BANDAGES/DRESSINGS) ×1 IMPLANT
SUCTION FRAZIER TIP 10 FR DISP (SUCTIONS) IMPLANT
SUT BONE WAX W31G (SUTURE) IMPLANT
SUT ETHIBOND 2-0 V-5 NDL (SUTURE) IMPLANT
SUT ETHIBOND 2-0 V-5 NEEDLE (SUTURE) ×4 IMPLANT
SUT ETHILON 3 0 PS 1 (SUTURE) IMPLANT
SUT ETHILON 4 0 PS 2 18 (SUTURE) IMPLANT
SUT MNCRL AB 3-0 PS2 18 (SUTURE) ×2 IMPLANT
SUT PROLENE 3 0 PS 2 (SUTURE) IMPLANT
SUT VIC AB 0 CT1 27 (SUTURE)
SUT VIC AB 0 CT1 27XBRD ANBCTR (SUTURE) IMPLANT
SUT VIC AB 0 SH 27 (SUTURE) ×2 IMPLANT
SUT VIC AB 2-0 PS2 27 (SUTURE) IMPLANT
SUT VIC AB 2-0 SH 27 (SUTURE)
SUT VIC AB 2-0 SH 27XBRD (SUTURE) IMPLANT
SUT VIC AB 3-0 SH 27 (SUTURE)
SUT VIC AB 3-0 SH 27X BRD (SUTURE) IMPLANT
SYR BULB 3OZ (MISCELLANEOUS) IMPLANT
TOWEL OR 17X24 6PK STRL BLUE (TOWEL DISPOSABLE) ×4 IMPLANT
WAND STAR VAC 90 (SURGICAL WAND) ×4 IMPLANT
WATER STERILE IRR 1000ML POUR (IV SOLUTION) ×4 IMPLANT
YANKAUER SUCT BULB TIP NO VENT (SUCTIONS) ×2 IMPLANT

## 2014-04-25 NOTE — Anesthesia Procedure Notes (Addendum)
Anesthesia Regional Block:  Interscalene brachial plexus block  Pre-Anesthetic Checklist: ,, timeout performed, Correct Patient, Correct Site, Correct Laterality, Correct Procedure, Correct Position, site marked, Risks and benefits discussed,  Surgical consent,  Pre-op evaluation,  At surgeon's request and post-op pain management  Laterality: Right and Upper  Prep: chloraprep       Needles:  Injection technique: Single-shot  Needle Type: Echogenic Needle     Needle Length: 5cm 5 cm Needle Gauge: 21 and 21 G    Additional Needles:  Procedures: ultrasound guided (picture in chart) Interscalene brachial plexus block Narrative:  Start time: 04/25/2014 11:52 AM End time: 04/25/2014 11:57 AM Injection made incrementally with aspirations every 5 mL.  Performed by: Personally  Anesthesiologist: CREWS, DAVID   Procedure Name: Intubation Date/Time: 04/25/2014 2:36 PM Performed by: Melynda Ripple D Pre-anesthesia Checklist: Patient identified, Emergency Drugs available, Suction available and Patient being monitored Patient Re-evaluated:Patient Re-evaluated prior to inductionOxygen Delivery Method: Circle System Utilized Preoxygenation: Pre-oxygenation with 100% oxygen Intubation Type: IV induction Ventilation: Mask ventilation without difficulty Laryngoscope Size: Mac and 3 Grade View: Grade I Tube type: Oral Number of attempts: 1 Airway Equipment and Method: Stylet and Oral airway Placement Confirmation: ETT inserted through vocal cords under direct vision,  positive ETCO2 and breath sounds checked- equal and bilateral Secured at: 23 cm Tube secured with: Tape Dental Injury: Teeth and Oropharynx as per pre-operative assessment

## 2014-04-25 NOTE — Discharge Instructions (Signed)
°  Post Anesthesia Home Care Instructions ° °Activity: °Get plenty of rest for the remainder of the day. A responsible adult should stay with you for 24 hours following the procedure.  °For the next 24 hours, DO NOT: °-Drive a car °-Operate machinery °-Drink alcoholic beverages °-Take any medication unless instructed by your physician °-Make any legal decisions or sign important papers. ° °Meals: °Start with liquid foods such as gelatin or soup. Progress to regular foods as tolerated. Avoid greasy, spicy, heavy foods. If nausea and/or vomiting occur, drink only clear liquids until the nausea and/or vomiting subsides. Call your physician if vomiting continues. ° °Special Instructions/Symptoms: °Your throat may feel dry or sore from the anesthesia or the breathing tube placed in your throat during surgery. If this causes discomfort, gargle with warm salt water. The discomfort should disappear within 24 hours. ° °If you had a scopolamine patch placed behind your ear for the management of post- operative nausea and/or vomiting: ° °1. The medication in the patch is effective for 72 hours, after which it should be removed.  Wrap patch in a tissue and discard in the trash. Wash hands thoroughly with soap and water. °2. You may remove the patch earlier than 72 hours if you experience unpleasant side effects which may include dry mouth, dizziness or visual disturbances. °3. Avoid touching the patch. Wash your hands with soap and water after contact with the patch. °  °Regional Anesthesia Blocks ° °1. Numbness or the inability to move the "blocked" extremity may last from 3-48 hours after placement. The length of time depends on the medication injected and your individual response to the medication. If the numbness is not going away after 48 hours, call your surgeon. ° °2. The extremity that is blocked will need to be protected until the numbness is gone and the  Strength has returned. Because you cannot feel it, you will need  to take extra care to avoid injury. Because it may be weak, you may have difficulty moving it or using it. You may not know what position it is in without looking at it while the block is in effect. ° °3. For blocks in the legs and feet, returning to weight bearing and walking needs to be done carefully. You will need to wait until the numbness is entirely gone and the strength has returned. You should be able to move your leg and foot normally before you try and bear weight or walk. You will need someone to be with you when you first try to ensure you do not fall and possibly risk injury. ° °4. Bruising and tenderness at the needle site are common side effects and will resolve in a few days. ° °5. Persistent numbness or new problems with movement should be communicated to the surgeon or the [facility] ([phone])/ [facility] ([phone]). °

## 2014-04-25 NOTE — Progress Notes (Signed)
Assisted Dr. Crews with right, ultrasound guided, interscalene  block. Side rails up, monitors on throughout procedure. See vital signs in flow sheet. Tolerated Procedure well. 

## 2014-04-25 NOTE — H&P (Signed)
  The recent History & Physical has been reviewed. I have personally examined the patient today. There is no interval change to the documented History & Physical. The patient would like to proceed with the procedure.  [name] W 04/25/2014,  2:21 PM

## 2014-04-25 NOTE — Op Note (Signed)
PATIENT ID:      [name]  MRN:     [id number] DOB/AGE:    [birth year] / 60 y.o.       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   Weight as of this encounter: 54.432 kg (120 lb).     POSTOPERATIVE DIAGNOSIS:   RIGHT SHOULDER ROTATOR CUFF TEAR WITH IMPINGEMENT AND DEGENERATIVE JOINT DISEASE OF ACROMIOCLAVICULAR JOINT                                                                     Estimated body mass index is 19.38 kg/(m^2) as calculated from the following:   Height as of this encounter: 5\' 6"  (6.122 m).   Weight as of this encounter: 54.432 kg (120 lb).     PROCEDURE:  Procedure(s):Right SHOULDER ARTHROSCOPY WITH MINI-OPEN ROTATOR CUFF REPAIR AND DISTAL CLAVICLE RESECTION, SUBACROMIAL DECOMPRESSION.     SURGEON:  [name], MD    ASSISTANT:   Biagio Borg, PA-C   (Present and scrubbed throughout the case, critical for assistance with exposure, retraction, instrumentation, and closure.)          ANESTHESIA: regional and general     DRAINS: none :      TOURNIQUET TIME: * No tourniquets in log *    COMPLICATIONS:  None   CONDITION:  stable  PROCEDURE IN ESLPNP:[id number]   [name], [name] 04/25/2014, 3:41 PM

## 2014-04-25 NOTE — Anesthesia Postprocedure Evaluation (Signed)
Anesthesia Post Note  Patient: [name]  Procedure(s) Performed: Procedure(s) (LRB): SHOULDER ARTHROSCOPY WITH MINI-OPEN ROTATOR CUFF REPAIR AND DISTAL CLAVICLE RESECTION (Right) SUBACROMIAL DECOMPRESSION (Right)  Anesthesia type: general  Patient location: PACU  Post pain: Pain level controlled  Post assessment: Patient's Cardiovascular Status Stable  Last Vitals:  Filed Vitals:   04/25/14 1615  BP: 137/72  Pulse: 88  Temp:   Resp: 14    Post vital signs: Reviewed and stable  Level of consciousness: sedated  Complications: No apparent anesthesia complications

## 2014-04-25 NOTE — Transfer of Care (Signed)
Immediate Anesthesia Transfer of Care Note  Patient: [name]  Procedure(s) Performed: Procedure(s): SHOULDER ARTHROSCOPY WITH MINI-OPEN ROTATOR CUFF REPAIR AND DISTAL CLAVICLE RESECTION (Right) SUBACROMIAL DECOMPRESSION (Right)  Patient Location: PACU  Anesthesia Type:General and Regional  Level of Consciousness: awake, alert  and oriented  Airway & Oxygen Therapy: Patient Spontanous Breathing and Patient connected to face mask oxygen  Post-op Assessment: Report given to RN and Post -op Vital signs reviewed and stable  Post vital signs: Reviewed and stable  Last Vitals:  Filed Vitals:   04/25/14 1235  BP:   Pulse: 73  Temp:   Resp: 14    Complications: No apparent anesthesia complications

## 2014-04-25 NOTE — Anesthesia Preprocedure Evaluation (Signed)
Anesthesia Evaluation  Patient identified by MRN, date of birth, ID band Patient awake    Reviewed: Allergy & Precautions, NPO status , Patient's Chart, lab work & pertinent test results  Airway Mallampati: I  TM Distance: >3 FB Neck ROM: Full    Dental  (+) Upper Dentures, Dental Advisory Given   Pulmonary COPD COPD inhaler, Current Smoker,  breath sounds clear to auscultation        Cardiovascular Rhythm:Regular Rate:Normal     Neuro/Psych    GI/Hepatic   Endo/Other    Renal/GU      Musculoskeletal   Abdominal   Peds  Hematology   Anesthesia Other Findings   Reproductive/Obstetrics                             Anesthesia Physical Anesthesia Plan  ASA: II  Anesthesia Plan: General   Post-op Pain Management:    Induction: Intravenous  Airway Management Planned: Oral ETT  Additional Equipment:   Intra-op Plan:   Post-operative Plan: Extubation in OR  Informed Consent: I have reviewed the patients History and Physical, chart, labs and discussed the procedure including the risks, benefits and alternatives for the proposed anesthesia with the patient or authorized representative who has indicated his/her understanding and acceptance.   Dental advisory given  Plan Discussed with: CRNA, Anesthesiologist and Surgeon  Anesthesia Plan Comments:         Anesthesia Quick Evaluation

## 2014-04-26 ENCOUNTER — Encounter (HOSPITAL_BASED_OUTPATIENT_CLINIC_OR_DEPARTMENT_OTHER): Payer: Self-pay | Admitting: Orthopaedic Surgery

## 2014-04-26 NOTE — Op Note (Signed)
[name], [name]             ACCOUNT NO.:  [id number]  MEDICAL RECORD NO.:  [id number]  LOCATION:                                 FACILITY:  PHYSICIAN:  Vonna Kotyk. Carrisa Keller, M.D.DATE OF BIRTH:  07/03/1955  DATE OF PROCEDURE:  04/25/2014 DATE OF DISCHARGE:  04/25/2014                              OPERATIVE REPORT   PREOPERATIVE DIAGNOSES: 1. Rotator cuff tear of right shoulder with impingement. 2. Degenerative joint disease acromioclavicular joint.  POSTOPERATIVE DIAGNOSES: 1. Rotator cuff tear of right shoulder with impingement. 2. Degenerative joint disease acromioclavicular joint.  PROCEDURE: 1. Diagnostic arthroscopy, right shoulder. 2. Arthroscopic subacromial decompression. 3. Arthroscopic distal clavicle resection. 4. Mini open rotator cuff tear repair.  SURGEON:  Vonna Kotyk. [name], MD  ASSISTANT:  Biagio Borg, PA-C.  ANESTHESIA:  General laryngeal with interscalene nerve block.  COMPLICATIONS:  None.  HISTORY:  A 59 year old female who has had problems with the right shoulder for many months.  She has not had an obvious injury or trauma, but has evidence of impingement.  She has had some temporary relief with a subacromial cortisone injection, but because of her persistent pain and compromise of activity, she has had an MRI scan, the scan reads, demonstrates degenerative changes of the [facility] joint with a type 3 acromion and a small probable full-thickness tear involving the supraspinatus tendon.  After much discussion, she preferred to proceed with surgical intervention.  PROCEDURE IN DETAIL:  [name] was met in the holding area, identified the right shoulder as appropriate operative site and marked it accordingly and Anesthesia performed an interscalene nerve block.  The patient was then carefully transported to room #2, and placed under general laryngeal anesthesia without difficulty.  She was placed in a semi-sitting position with the shoulder  frame.  Examination of the shoulder revealed no evidence of instability.  She was a little bit tight in the extreme of flexion at about 160-65 degrees, but has similar finding in the opposite side.  She had symmetrical range of motion, so manipulation was not performed.  The right shoulder was then prepped with DuraPrep in the base of the neck circumferentially below the elbows.  Sterile draping was performed. Time-out was called.  A marking pen was used to outline the [facility] joint to coracoid and acromion at a point of fingerbreadth posterior and medial to the posterior angle acromion, a small stab wound was made and the arthroscope was easily placed in the shoulder joint.  Diagnostic arthroscopy revealed an evidence of loose material, I did not see any appreciable chondromalacia.  The labrum was intact.  The biceps appeared to be intact.  Subscapularis tendon was without a tear.  There was a very small tear on the joint surface of the cuff that was probably several mm in diameter.  There was no frayed edge.  Otherwise, I did not see any pathology.  There was just minimal synovitis.  I then placed the arthroscope in the subacromial space posteriorly, the cannula and subacromial space anteriorly and a third portal was established in the lateral subacromial space.  An arthroscopic subacromial decompression was performed.  There was a moderate amount of minimally inflamed bursal tissue.  I  resected this with the ArthroCare wand.  There was obvious anterior overhang of the acromion and very minimal lateral overhang.  I used a 6 mm bur to flatten the anterior acromion, had a nice flat resection through and I had plenty of space now between the anterior edge of the acromion and the cuff.  I thought there was some frayed areas along the cuff that could be consistent with a full tear.  So, I elected to perform an open exploration.  I did perform a distal clavicle resection.  There was  inflammatory tissue about the [facility] joint with obvious degenerative change.  I had nice flat resection with a 6 mm bur.  About an inch or less incision was then made incorporating the anterior portal via sharp dissection carried down to subcutaneous tissue.  Small bleeders were Bovie coagulated.  A raphe in the deltoid fascia was identified and incised.  Fibers were separated.  There was still some bursal tissue remaining which I resected and then there was an area that was very, very small with probably several mm appeared to be full- thickness, so I simply incised it and then repaired the tendon with a 2- 0 Ethibond.  This corresponded directly beneath the anterior acromion, I suspect that was the cause of her pain.  I also thought that I had an excellent subacromial decompression.  The wound was irrigated with saline solution.  The running 0 Vicryl was used to close the deltoid fascia.  Subcu closed with 3-0 Monocryl.  Skin closed with Steri-Strips over benzoin.  Sterile bulky dressing was applied followed by a sling.  The patient tolerated the procedure without complications.  PLAN:  [facility] for Pain office, Friday morning.     Vonna Kotyk. [name], M.D.     PWW/MEDQ  D:  04/25/2014  T:  04/26/2014  Job:  [id number]

## 2014-05-08 ENCOUNTER — Ambulatory Visit: Payer: 59 | Attending: Orthopaedic Surgery

## 2014-05-08 DIAGNOSIS — R293 Abnormal posture: Secondary | ICD-10-CM | POA: Diagnosis not present

## 2014-05-08 DIAGNOSIS — M25511 Pain in right shoulder: Secondary | ICD-10-CM

## 2014-05-08 DIAGNOSIS — M25611 Stiffness of right shoulder, not elsewhere classified: Secondary | ICD-10-CM | POA: Insufficient documentation

## 2014-05-08 NOTE — Therapy (Signed)
[place], [place], [address] Phone: [phone]   Fax:  [phone]  Physical Therapy Evaluation  Patient Details  Name: [name] MRN: [id number] Date of Birth: [birth year] Referring Provider:  Garald Balding, MD  Encounter Date: 05/08/2014      PT End of Session - 05/08/14 1222    Visit Number 1   Number of Visits 16   Date for PT Re-Evaluation 07/03/14   PT Start Time 2536   PT Stop Time 1230   PT Time Calculation (min) 45 min   Activity Tolerance Patient tolerated treatment well   Behavior During Therapy [facility] for tasks assessed/performed      Past Medical History  Diagnosis Date  . Wears dentures     top  . Wears glasses   . 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 Arthritis   . Allergy   . Migraines   . Anxiety   . Thyroid disease     Hx of     Past Surgical History  Procedure Laterality Date  . 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 Neck surgery  1990    rt ? parotidectomy  . Bladder surgery      bladder tac  . Bowel resection    . Abdominal hysterectomy  1991    partial  . Neck mass excision      rt-mass  . Dorsal compartment release Right 02/07/2013    Procedure: RIGHT WRIST DEQUERVAIN RELEASE;  Surgeon: Jolyn Nap, MD;  Location: [place];  Service: Orthopedics;  Laterality: Right;  . Thyroid surgery      bx  . Shoulder arthroscopy with open rotator cuff repair and distal clavicle acrominectomy Right 04/25/2014    Procedure: SHOULDER ARTHROSCOPY WITH MINI-OPEN ROTATOR CUFF REPAIR AND DISTAL CLAVICLE RESECTION;  Surgeon: Garald Balding, MD;  Location: [place];  Service: Orthopedics;  Laterality: Right;  . Subacromial decompression Right 04/25/2014    Procedure: SUBACROMIAL DECOMPRESSION;  Surgeon: Garald Balding, MD;  Location: [place];  Service: Orthopedics;   Laterality: Right;    There were no vitals filed for this visit.  Visit Diagnosis:  Pain in right shoulder - Plan: PT plan of care cert/re-cert  Stiffness of right shoulder joint - Plan: PT plan of care cert/re-cert  Abnormal posture - Plan: PT plan of care cert/re-cert          [facility] PT Assessment - 05/08/14 1144    Assessment   Medical Diagnosis SAD.DCR, mini open RTC repair RT   Onset Date 04/25/14   Precautions   Precaution Comments RTC repair protocol, wear sling when active   Restrictions   Weight Bearing Restrictions No   Balance Screen   Has the patient fallen in the past 6 months No   Has the patient had a decrease in activity level because of a fear of falling?  No   Is the patient reluctant to leave their home because of a fear of falling?  No   Prior Function   Level of Independence Independent with basic ADLs   Cognition   Overall Cognitive Status Within Functional Limits for tasks assessed   Observation/Other Assessments   Focus on Therapeutic Outcomes (FOTO)  56%   Posture/Postural Control   Posture Comments Rounded shoulders.    ROM / Strength   AROM / PROM /  Strength AROM;Strength;PROM   AROM   AROM Assessment Site Shoulder   Right/Left Shoulder Right;Left   Right Shoulder Extension 35 Degrees   Right Shoulder Flexion 90 Degrees   Left Shoulder Extension 60 Degrees   Left Shoulder Flexion 138 Degrees   Left Shoulder ABduction 152 Degrees   Left Shoulder Internal Rotation 55 Degrees   Left Shoulder External Rotation 80 Degrees   Left Shoulder Horizontal ABduction 25 Degrees   Left Shoulder Horizontal ADduction 105 Degrees   PROM   PROM Assessment Site Shoulder   Right/Left Shoulder Left   Strength   Overall Strength Comments Not tested on RT today.   Ambulation/Gait   Gait Comments WNL                           PT Education - 05/08/14 1222    Education provided Yes   Education Details POC, ice and HEP ,posture    Person(s) Educated Patient   Methods Explanation;Demonstration;Tactile cues;Verbal cues;Handout   Comprehension Returned demonstration;Verbalized understanding          PT Short Term Goals - 05/08/14 1231    PT SHORT TERM GOAL #1   Title Independent within protocol exercises.    Time 4   Period Weeks   Status New   PT SHORT TERM GOAL #2   Title she will report pain decresed 40% or more generally throughout the day.    Time 4   Period Weeks   Status New   PT SHORT TERM GOAL #3   Title She will improve active range LT shoulder to week 4 in protocol   Time 4   Period Weeks   Status New           PT Long Term Goals - 05/08/14 1522    PT LONG TERM GOAL #1   Title She will ba able to do all HEP issued as of the last visit   Time 8   Period Weeks   Status New   PT LONG TERM GOAL #2   Title she will report pain decreased 75% or more and become intermittant   Time 8   Period Weeks   Status New   PT LONG TERM GOAL #3   Title She will be able to move arm for normal selfcare.    Time 8   Period Weeks   Status New   PT LONG TERM GOAL #4   Title She will be able to participate in child care   Time 8   Period Weeks   Status New   PT LONG TERM GOAL #5   Title She will resume normal home tasks with min pain   Time 8   Period Weeks   Status New   Additional Long Term Goals   Additional Long Term Goals Yes   PT LONG TERM GOAL #6   Title She will be able to lye on RT side with  mn to  no pain for 1-2 hours   Time 8   Period Weeks   Status New               Plan - 05/08/14 1223    Clinical Impression Statement She is limited by pain but is able to move assisted within protocol limits week 2 . She should do well in PT when pain more under control.    Rehab Potential Good   PT Frequency 2x / week   PT Duration  8 weeks   PT Treatment/Interventions Moist Heat;Cryotherapy;Electrical Stimulation;Manual techniques;Dry needling;Therapeutic exercise;Passive range of  motion;Patient/family education  Work within Education officer, environmental   PT Next Visit Plan Start week 2-3 in protocol. modalities for pain. Manual for range and pain   PT Home Exercise Plan Ice , postrue scapula retraction   Consulted and Agree with Plan of Care Patient     We will follow basic RTC protocol    Problem List Patient Active Problem List   Diagnosis Date Noted  . Rotator cuff impingement syndrome of right shoulder 04/25/2014  . 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  . Right rotator cuff tear 04/25/2014  . Insomnia due to stress 04/18/2014  . Cough 03/28/2014  . Acute maxillary sinusitis 03/28/2014  . Thyroiditis, subacute 12/22/2013  . Right thyroid nodule 12/22/2013  . Postnasal drip 12/21/2013  . Irritability 12/08/2013  . Arthritis 12/08/2013  . Joint pain 10/03/2013  . 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 Elevated BP 10/03/2013  . Bradycardia with 41 - 50 beats per minute 08/10/2013  . History of nephrectomy 08/10/2013  . Smoker 08/09/2013  . Paresthesia of both hands 08/09/2013  . WEIGHT LOSS, RECENT 08/09/2009  . ALLERGIC RHINITIS 04/01/2007  . TOBACCO ABUSE 03/05/2007  . Mila Homer 03/05/2007    Darrel Hoover PT 05/08/2014, 3:29 PM  [facility] [address] [place], [place], [address] Phone: [phone]   Fax:  [phone]

## 2014-05-08 NOTE — Patient Instructions (Signed)
Posture with pillow support to RT arm, neck sidebending to LT for stretching 10-30 sec 3x/day, scapula retraction,2-3 reps 8-10 x per day , ice 3-4x/day for 15 min

## 2014-05-16 ENCOUNTER — Ambulatory Visit (INDEPENDENT_AMBULATORY_CARE_PROVIDER_SITE_OTHER): Payer: 59 | Admitting: Nurse Practitioner

## 2014-05-16 ENCOUNTER — Encounter: Payer: Self-pay | Admitting: Nurse Practitioner

## 2014-05-16 VITALS — BP 137/88 | HR 78 | Temp 97.7°F | Ht 66.0 in | Wt 120.0 lb

## 2014-05-16 DIAGNOSIS — R05 Cough: Secondary | ICD-10-CM

## 2014-05-16 DIAGNOSIS — R058 Other specified cough: Secondary | ICD-10-CM | POA: Insufficient documentation

## 2014-05-16 DIAGNOSIS — I10 Essential (primary) hypertension: Secondary | ICD-10-CM

## 2014-05-16 MED ORDER — AMLODIPINE BESYLATE 10 MG PO TABS: 10.0000 mg | ORAL_TABLET | Freq: Every day | ORAL | Status: DC

## 2014-05-16 NOTE — Progress Notes (Signed)
Pre visit review using our clinic review tool, if applicable. No additional management support is needed unless otherwise documented below in the visit note. 

## 2014-05-16 NOTE — Progress Notes (Signed)
Subjective:     [name] is a 59 y.o. female who presents for follow up of cough. Cough has finally resolved. She tried sinus washes-made it better, but didn't stop it. Stopped lisinopril-made it better, but didn't stop it. Started pepcid, didn't help. Took omeprazole 3 days-cough stopped, but due to stomach pain, she stopped medication. She says cough has not returned.  She is smoker w/COPD. She had R shoulder rotator cuff repair 3 weeks ago. Doing well, still going to PT. Not wearing sling today-says she cannot drive with sling on. Reports dry mouth-likely due to narcotic pain med use-only taking at night. Denies constipation. HTN: BP well controlled. GOAL 135/85 due to has 1 kidney (donated kidney to daughter). No intol SE amlodopine. Lisinopril may have contributed to cough.  The following portions of the patient's history were reviewed and updated as appropriate: allergies, current medications, past medical history, past social history, past surgical history and problem list.  Review of Systems Pertinent items are noted in HPI.    Objective:    BP 137/88 mmHg  Pulse 78  Temp(Src) 97.7 F (36.5 C) (Oral)  Ht 5\' 6"  (1.676 m)  Wt 120 lb (54.432 kg)  BMI 19.38 kg/m2  SpO2 98% General appearance: alert, cooperative, appears stated age and no distress Head: Normocephalic, without obvious abnormality, atraumatic Eyes: negative findings: lids and lashes normal, conjunctivae and sclerae normal and wearing glasses Lungs: clear to auscultation bilaterally Heart: regular rate and rhythm, S1, S2 normal, no murmur, click, rub or gallop Extremities: well approximated healing scar R shoulder, scant bruising over bicep. Neurologic: Grossly normal    Assessment:Plan   1. Essential hypertension, benign - amLODipine (NORVASC) 10 MG tablet; Take 1 tablet (10 mg total) by mouth daily.  Dispense: 90 tablet; Refill: 1  2. Cough variant not due to asthma Continue sinus rinses Avoid  ACEI Take pepcid PRN

## 2014-05-16 NOTE — Patient Instructions (Signed)
Continue blood pressure medicines daily.  Take care of your shoulder!  See you in 6 months unless you need me sooner.

## 2014-05-18 ENCOUNTER — Ambulatory Visit: Payer: 59 | Admitting: Physical Therapy

## 2014-05-18 DIAGNOSIS — R293 Abnormal posture: Secondary | ICD-10-CM

## 2014-05-18 DIAGNOSIS — M25511 Pain in right shoulder: Secondary | ICD-10-CM

## 2014-05-18 DIAGNOSIS — M25611 Stiffness of right shoulder, not elsewhere classified: Secondary | ICD-10-CM

## 2014-05-18 NOTE — Therapy (Signed)
[place], [place], [address] Phone: [phone]   Fax:  [phone]  Physical Therapy Treatment  Patient Details  Name: [name] MRN: [id number] Date of Birth: 29-Dec-1955 Referring Provider:  Irene Pap, NP  Encounter Date: 05/18/2014      PT End of Session - 05/18/14 0908    Visit Number 2   Number of Visits 16   Date for PT Re-Evaluation 07/03/14   PT Start Time 0847   PT Stop Time 0930   PT Time Calculation (min) 43 min      Past Medical History  Diagnosis Date  . Wears dentures     top  . Wears glasses   . 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 Arthritis   . Allergy   . Migraines   . Anxiety   . Thyroid disease     Hx of     Past Surgical History  Procedure Laterality Date  . 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 Neck surgery  1990    rt ? parotidectomy  . Bladder surgery      bladder tac  . Bowel resection    . Abdominal hysterectomy  1991    partial  . Neck mass excision      rt-mass  . Dorsal compartment release Right 02/07/2013    Procedure: RIGHT WRIST DEQUERVAIN RELEASE;  Surgeon: Jolyn Nap, MD;  Location: [place];  Service: Orthopedics;  Laterality: Right;  . Thyroid surgery      bx  . Shoulder arthroscopy with open rotator cuff repair and distal clavicle acrominectomy Right 04/25/2014    Procedure: SHOULDER ARTHROSCOPY WITH MINI-OPEN ROTATOR CUFF REPAIR AND DISTAL CLAVICLE RESECTION;  Surgeon: Garald Balding, MD;  Location: [place];  Service: Orthopedics;  Laterality: Right;  . Subacromial decompression Right 04/25/2014    Procedure: SUBACROMIAL DECOMPRESSION;  Surgeon: Garald Balding, MD;  Location: [place];  Service: Orthopedics;  Laterality: Right;    There were no vitals filed for this visit.  Visit Diagnosis:  Pain in right shoulder  Stiffness of  right shoulder joint  Abnormal posture      Subjective Assessment - 05/18/14 0851    Subjective I cannot get my upper trap to relax. The only time I have pain is if I do too much.    Currently in Pain? No/denies            [facility] PT Assessment - 05/18/14 0909    PROM   Right/Left Shoulder Right   Right Shoulder Flexion 125 Degrees   Right Shoulder ABduction 90 Degrees   Right Shoulder Internal Rotation 65 Degrees   Right Shoulder External Rotation 45 Degrees                     [facility] Adult PT Treatment/Exercise - 05/18/14 0911    Shoulder Exercises: Supine   Other Supine Exercises supine cane exercises x10 each   Manual Therapy   Manual Therapy Massage;Passive ROM   Massage Trigger point release to levator scap and upper trap on right followed by Mcconnel's Tape to inhibit muscles   Passive ROM Passive ROM with max cues to relax, Flexion as tolerated, abduction to 90 degrees per protocol and ER to 45 degrees per protocol.  PT Education - 05/18/14 0957    Education provided Yes   Education Details Supine [employer]) Educated Patient   Methods Explanation;Handout   Comprehension Verbalized understanding          PT Short Term Goals - 05/08/14 1231    PT SHORT TERM GOAL #1   Title Independent within protocol exercises.    Time 4   Period Weeks   Status New   PT SHORT TERM GOAL #2   Title she will report pain decresed 40% or more generally throughout the day.    Time 4   Period Weeks   Status New   PT SHORT TERM GOAL #3   Title She will improve active range LT shoulder to week 4 in protocol   Time 4   Period Weeks   Status New           PT Long Term Goals - 05/08/14 1522    PT LONG TERM GOAL #1   Title She will ba able to do all HEP issued as of the last visit   Time 8   Period Weeks   Status New   PT LONG TERM GOAL #2   Title she will report pain decreased 75% or more and become intermittant   Time 8    Period Weeks   Status New   PT LONG TERM GOAL #3   Title She will be able to move arm for normal selfcare.    Time 8   Period Weeks   Status New   PT LONG TERM GOAL #4   Title She will be able to participate in child care   Time 8   Period Weeks   Status New   PT LONG TERM GOAL #5   Title She will resume normal home tasks with min pain   Time 8   Period Weeks   Status New   Additional Long Term Goals   Additional Long Term Goals Yes   PT LONG TERM GOAL #6   Title She will be able to lye on RT side with  mn to  no pain for 1-2 hours   Time 8   Period Weeks   Status New               Plan - 05/18/14 0957    Clinical Impression Statement See objective measures for PROM. Pt was instructed in supine cane exercises for HEP without increased pain. Pt reports her upper trap spasm is her biggest problem. Muscle relaxers and anxiety meds are not working for her. She is interested in dry needling and a request was sent with her since she sees MD tomorrow for follow up. Trigger point release followed by Mcconnels tape to inhibit levator and upper trap applied. Will assess benefit next week.    PT Next Visit Plan Continue per protocol, did MD consent to dry needling, was inhibition tape helpful? review supine cane        Problem List Patient Active Problem List   Diagnosis Date Noted  . Cough variant not due to asthma 05/16/2014  . Essential hypertension, benign 05/16/2014  . Rotator cuff impingement syndrome of right shoulder 04/25/2014  . 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  . Right rotator cuff tear 04/25/2014  . Thyroiditis, subacute 12/22/2013  . Right thyroid nodule 12/22/2013  . Irritability 12/08/2013  . Arthritis 12/08/2013  . Joint pain 10/03/2013  . 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 Elevated BP  10/03/2013  . History of nephrectomy 08/10/2013  . Smoker 08/09/2013  . Paresthesia of both hands 08/09/2013  . TOBACCO ABUSE  03/05/2007  . Mila Homer 03/05/2007    Dorene Ar, PTA 05/18/2014, 10:17 AM  [facility] [address] [place], [place], [address] Phone: [phone]   Fax:  [phone]

## 2014-05-18 NOTE — Patient Instructions (Signed)
SHOULDER: External Rotation - Supine (Cane)   Hold cane with both hands. Rotate arm away from body. Keep elbow on floor and next to body. _10__ reps per set, _2__ sets per day, _7__ days per week Add towel to keep elbow at side.  Copyright  VHI. All rights reserved.  Cane Horizontal - Supine   With straight arms holding cane above shoulders, bring cane out to right, center, out to left, and back to above head. Repeat __10_ times. Do __2_ times per day.  Copyright  VHI. All rights reserved.  Cane Exercise: Flexion   Lie on back, holding cane above chest. Keeping arms as straight as possible, lower cane toward floor beyond head. Hold __5__ seconds. Repeat __10__ times. Do _2___ sessions per day.  [email or web address].exer.[email or web address]   Copyright  VHI. All rights reserved.   

## 2014-05-22 ENCOUNTER — Ambulatory Visit: Payer: 59

## 2014-05-22 DIAGNOSIS — M25511 Pain in right shoulder: Secondary | ICD-10-CM

## 2014-05-22 DIAGNOSIS — M25611 Stiffness of right shoulder, not elsewhere classified: Secondary | ICD-10-CM

## 2014-05-22 NOTE — Therapy (Signed)
[place], [place], [address] Phone: [phone]   Fax:  [phone]  Physical Therapy Treatment  Patient Details  Name: [name] MRN: [id number] Date of Birth: 12-07-1955 Referring Provider:  Irene Pap, NP  Encounter Date: 05/22/2014      PT End of Session - 05/22/14 1232    Visit Number 3   Number of Visits 16   Date for PT Re-Evaluation 07/03/14   PT Start Time 1210  late 25 min   PT Stop Time 1248   PT Time Calculation (min) 38 min   Activity Tolerance Patient limited by pain   Behavior During Therapy Anxious      Past Medical History  Diagnosis Date  . Wears dentures     top  . Wears glasses   . 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 Arthritis   . Allergy   . Migraines   . Anxiety   . Thyroid disease     Hx of     Past Surgical History  Procedure Laterality Date  . 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 Neck surgery  1990    rt ? parotidectomy  . Bladder surgery      bladder tac  . Bowel resection    . Abdominal hysterectomy  1991    partial  . Neck mass excision      rt-mass  . Dorsal compartment release Right 02/07/2013    Procedure: RIGHT WRIST DEQUERVAIN RELEASE;  Surgeon: Jolyn Nap, MD;  Location: [place];  Service: Orthopedics;  Laterality: Right;  . Thyroid surgery      bx  . Shoulder arthroscopy with open rotator cuff repair and distal clavicle acrominectomy Right 04/25/2014    Procedure: SHOULDER ARTHROSCOPY WITH MINI-OPEN ROTATOR CUFF REPAIR AND DISTAL CLAVICLE RESECTION;  Surgeon: Garald Balding, MD;  Location: [place];  Service: Orthopedics;  Laterality: Right;  . Subacromial decompression Right 04/25/2014    Procedure: SUBACROMIAL DECOMPRESSION;  Surgeon: Garald Balding, MD;  Location: [facility];  Service: Orthopedics;  Laterality: Right;    There  were no vitals filed for this visit.  Visit Diagnosis:  Pain in right shoulder  Stiffness of right shoulder joint      Subjective Assessment - 05/22/14 1209    Subjective Her pain is no better . I dont take pain meds anymore. Voltarene patch no benenfit.    Pertinent History Pain started 03/2013. Post surgery 04/25/2014.    How long can you sit comfortably? as needed   How long can you stand comfortably? as needed   How long can you walk comfortably? as needed   Patient Stated Goals REduce pain . Improve movement   Currently in Pain? Yes   Pain Score 8    Pain Location Shoulder   Pain Orientation Right   Pain Descriptors / Indicators Aching   Pain Type Chronic pain   Pain Onset More than a month ago   Pain Frequency Constant   Aggravating Factors  using arm   Pain Relieving Factors hot water   Multiple Pain Sites No                         [facility] Adult PT Treatment/Exercise - 05/22/14 1213    Exercises   Exercises Shoulder   Shoulder Exercises:  Seated   Other Seated Exercises UBE 90 RPM 4 min Normal pace woithout visible effort.    Shoulder Exercises: Pulleys   Flexion 3 minutes   Modalities   Modalities Moist Heat   Moist Heat Therapy   Number Minutes Moist Heat 15 Minutes   Moist Heat Location Shoulder  RT   Manual Therapy   Manual therapy comments stretching to traps on RT woth over pressure.    Passive ROM Passive motion all planes and behind back as far as I was able                   PT Short Term Goals - 05/08/14 1231    PT SHORT TERM GOAL #1   Title Independent within protocol exercises.    Time 4   Period Weeks   Status New   PT SHORT TERM GOAL #2   Title she will report pain decresed 40% or more generally throughout the day.    Time 4   Period Weeks   Status New   PT SHORT TERM GOAL #3   Title She will improve active range LT shoulder to week 4 in protocol   Time 4   Period Weeks   Status New           PT Long Term  Goals - 05/08/14 1522    PT LONG TERM GOAL #1   Title She will ba able to do all HEP issued as of the last visit   Time 8   Period Weeks   Status New   PT LONG TERM GOAL #2   Title she will report pain decreased 75% or more and become intermittant   Time 8   Period Weeks   Status New   PT LONG TERM GOAL #3   Title She will be able to move arm for normal selfcare.    Time 8   Period Weeks   Status New   PT LONG TERM GOAL #4   Title She will be able to participate in child care   Time 8   Period Weeks   Status New   PT LONG TERM GOAL #5   Title She will resume normal home tasks with min pain   Time 8   Period Weeks   Status New   Additional Long Term Goals   Additional Long Term Goals Yes   PT LONG TERM GOAL #6   Title She will be able to lye on RT side with  mn to  no pain for 1-2 hours   Time 8   Period Weeks   Status New               Plan - 05/22/14 1233    Clinical Impression Statement She was painful at end range with PROMand cont with 10 degrees less motion on RT shoulder.  She was better post HMP   PT Next Visit Plan Cont per protocol and dry needle order here and will schedule as able with PT who does needle treatment   Consulted and Agree with Plan of Care Patient        Problem List Patient Active Problem List   Diagnosis Date Noted  . Cough variant not due to asthma 05/16/2014  . Essential hypertension, benign 05/16/2014  . Rotator cuff impingement syndrome of right shoulder 04/25/2014  . 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  . Right rotator cuff tear 04/25/2014  . Thyroiditis, subacute 12/22/2013  . Right thyroid nodule 12/22/2013  .  Irritability 12/08/2013  . Arthritis 12/08/2013  . Joint pain 10/03/2013  . 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 Elevated BP 10/03/2013  . History of nephrectomy 08/10/2013  . Smoker 08/09/2013  . Paresthesia of both hands 08/09/2013  . TOBACCO ABUSE 03/05/2007  . Mila Homer  03/05/2007    Darrel Hoover PT 05/22/2014, 12:36 PM  [facility] [place] [address] [place], [place], [address] Phone: [phone]   Fax:  [phone]

## 2014-05-25 ENCOUNTER — Ambulatory Visit: Payer: 59 | Admitting: Physical Therapy

## 2014-05-25 DIAGNOSIS — M542 Cervicalgia: Secondary | ICD-10-CM

## 2014-05-25 DIAGNOSIS — M25611 Stiffness of right shoulder, not elsewhere classified: Secondary | ICD-10-CM

## 2014-05-25 DIAGNOSIS — R293 Abnormal posture: Secondary | ICD-10-CM

## 2014-05-25 DIAGNOSIS — M25511 Pain in right shoulder: Secondary | ICD-10-CM

## 2014-05-25 NOTE — Patient Instructions (Signed)
Trigger Point Dry Needling  . What is Trigger Point Dry Needling (DN)? o DN is a physical therapy technique used to treat muscle pain and dysfunction. Specifically, DN helps deactivate muscle trigger points (muscle knots).  o A thin filiform needle is used to penetrate the skin and stimulate the underlying trigger point. The goal is for a local twitch response (LTR) to occur and for the trigger point to relax. No medication of any kind is injected during the procedure.   . What Does Trigger Point Dry Needling Feel Like?  o The procedure feels different for each individual patient. Some patients report that they do not actually feel the needle enter the skin and overall the process is not painful. Very mild bleeding may occur. However, many patients feel a deep cramping in the muscle in which the needle was inserted. This is the local twitch response.   Marland Kitchen How Will I feel after the treatment? o Soreness is normal, and the onset of soreness may not occur for a few hours. Typically this soreness does not last longer than two days.  o Bruising is uncommon, however; ice can be used to decrease any possible bruising.  o In rare cases feeling tired or nauseous after the treatment is normal. In addition, your symptoms may get worse before they get better, this period will typically not last longer than 24 hours.   . What Can I do After My Treatment? o Increase your hydration by drinking more water for the next 24 hours. o You may place ice or heat on the areas treated that have become sore, however, do not use heat on inflamed or bruised areas. Heat often brings more relief post needling. o You can continue your regular activities, but vigorous activity is not recommended initially after the treatment for 24 hours. o DN is best combined with other physical therapy such as strengthening, stretching, and other therapies.   Levator Stretch   Grasp seat or sit on hand on side to be stretched. Turn head  toward other side and look down. Use hand on head to gently stretch neck in that position. Hold _30-60___ seconds. Repeat on other side. Repeat _2-3___ times. Do __3__ sessions per day.  http://[email or web address].exer.us/30       .

## 2014-05-25 NOTE — Therapy (Signed)
[place], [place], [address] Phone: [phone]   Fax:  [phone]  Physical Therapy Treatment  Patient Details  Name: [name] MRN: [id number] Date of Birth: 1955-01-26 Referring Provider:  Irene Pap, NP  Encounter Date: 05/25/2014      PT End of Session - 05/25/14 1117    Visit Number 4   Date for PT Re-Evaluation 07/03/14   PT Start Time 1102   PT Stop Time 1200   PT Time Calculation (min) 58 min   Activity Tolerance Patient tolerated treatment well   Behavior During Therapy [facility] for tasks assessed/performed;Anxious      Past Medical History  Diagnosis Date  . Wears dentures     top  . Wears glasses   . 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 Arthritis   . Allergy   . Migraines   . Anxiety   . Thyroid disease     Hx of     Past Surgical History  Procedure Laterality Date  . 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 Neck surgery  1990    rt ? parotidectomy  . Bladder surgery      bladder tac  . Bowel resection    . Abdominal hysterectomy  1991    partial  . Neck mass excision      rt-mass  . Dorsal compartment release Right 02/07/2013    Procedure: RIGHT WRIST DEQUERVAIN RELEASE;  Surgeon: Jolyn Nap, MD;  Location: [place];  Service: Orthopedics;  Laterality: Right;  . Thyroid surgery      bx  . Shoulder arthroscopy with open rotator cuff repair and distal clavicle acrominectomy Right 04/25/2014    Procedure: SHOULDER ARTHROSCOPY WITH MINI-OPEN ROTATOR CUFF REPAIR AND DISTAL CLAVICLE RESECTION;  Surgeon: Garald Balding, MD;  Location: [place];  Service: Orthopedics;  Laterality: Right;  . Subacromial decompression Right 04/25/2014    Procedure: SUBACROMIAL DECOMPRESSION;  Surgeon: Garald Balding, MD;  Location: [place];  Service: Orthopedics;  Laterality: Right;     There were no vitals filed for this visit.  Visit Diagnosis:  Pain in right shoulder  Stiffness of right shoulder joint  Abnormal posture  Neck pain on right side      Subjective Assessment - 05/25/14 1108    Subjective I was so painful after my arm was pulled down my back, I cried all the way home.  I have pain medicine today   Pertinent History Pain started 03/2013. Post surgery 04/25/2014.    Currently in Pain? Yes   Pain Score 4    Pain Location Shoulder   Pain Orientation Right   Pain Descriptors / Indicators Aching;Sore   Pain Type Chronic pain   Pain Onset More than a month ago   Pain Frequency Constant            [facility] PT Assessment - 05/25/14 0001    AROM   Cervical Flexion 60   Cervical Extension 30   Cervical - Right Side Bend 35   Cervical - Left Side Bend 40   Cervical - Right Rotation 40   Cervical - Left Rotation 45                     [facility] Adult PT Treatment/Exercise - 05/25/14 1140    Moist Heat Therapy  Number Minutes Moist Heat 15 Minutes   Moist Heat Location --  neck and upper back   Manual Therapy   Manual Therapy Joint mobilization;Myofascial release   Joint Mobilization grade 3 lateral PA C-3 to C-6 from right and T-3 to T-8 grade 2 PA mobs   Soft tissue mobilization soft tissue for Right scalenes   Myofascial Release upper trap and levator          Trigger Point Dry Needling - 05/25/14 1111    Consent Given? Yes   Education Handout Provided Yes   Muscles Treated Upper Body Upper trapezius;Levator scapulae;Supraspinatus  right scalenes twitch response and palpable muscle lengtheni   Upper Trapezius Response Twitch reponse elicited;Palpable increased muscle length  right only   Levator Scapulae Response Twitch response elicited;Palpable increased muscle length  right only   Supraspinatus Response Twitch response elicited;Palpable increased muscle length  right only              PT Education - 05/25/14  1116    Education Details trigger point dry needling precautian and aftercare plus levator stretch   Person(s) Educated Patient   Methods Explanation;Demonstration;Handout;Verbal cues   Comprehension Verbalized understanding;Returned demonstration          PT Short Term Goals - 05/25/14 1337    PT SHORT TERM GOAL #1   Title Independent within protocol exercises.    Time 4   Period Weeks   Status On-going   PT SHORT TERM GOAL #2   Title she will report pain decresed 40% or more generally throughout the day.    Time 4   Period Weeks   Status On-going   PT SHORT TERM GOAL #3   Title She will improve active range LT shoulder to week 4 in protocol   Time 4   Period Weeks   Status On-going           PT Long Term Goals - 05/25/14 1337    PT LONG TERM GOAL #1   Title She will ba able to do all HEP issued as of the last visit   Time 8   Period Weeks   Status On-going   PT LONG TERM GOAL #2   Title she will report pain decreased 75% or more and become intermittant   Time 8   Period Weeks   Status On-going   PT LONG TERM GOAL #3   Title She will be able to move arm for normal selfcare.    Time 8   Period Weeks   Status On-going   PT LONG TERM GOAL #4   Title She will be able to participate in child care   Time 8   Period Weeks   Status On-going   PT LONG TERM GOAL #5   Title She will resume normal home tasks with min pain   Time 8   Period Weeks   Status On-going   PT LONG TERM GOAL #6   Title She will be able to lye on RT side with  mn to  no pain for 1-2 hours   Time 8   Period Weeks   Status On-going               Plan - 05/25/14 1340    Clinical Impression Statement Pt was anxious and concerned about being in more pain than after previous sessions.  Pt was reassured and educated on trigger point dry needling and aftercare.  Pt AROM was measured to chart progress with  decrease in pain.  Pt tolerated TDN and felt some releif from upper trap muscle  spasm aafter  session.  Pt was educated in importance of continuing stretches  and continuing hep given before.     Pt will benefit from skilled therapeutic intervention in order to improve on the following deficits Pain;Postural dysfunction;Improper body mechanics;Increased fascial restricitons;Increased muscle spasms;Decreased range of motion   Rehab Potential Good   PT Frequency 2x / week   PT Duration 8 weeks   PT Treatment/Interventions Moist Heat;Cryotherapy;Electrical Stimulation;Manual techniques;Dry needling;Therapeutic exercise;Passive range of motion;Patient/family education   PT Next Visit Plan continue per protocol and assess dry needling   Consulted and Agree with Plan of Care Patient        Problem List Patient Active Problem List   Diagnosis Date Noted  . Cough variant not due to asthma 05/16/2014  . Essential hypertension, benign 05/16/2014  . Rotator cuff impingement syndrome of right shoulder 04/25/2014  . 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  . Right rotator cuff tear 04/25/2014  . Thyroiditis, subacute 12/22/2013  . Right thyroid nodule 12/22/2013  . Irritability 12/08/2013  . Arthritis 12/08/2013  . Joint pain 10/03/2013  . 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 Elevated BP 10/03/2013  . History of nephrectomy 08/10/2013  . Smoker 08/09/2013  . Paresthesia of both hands 08/09/2013  . TOBACCO ABUSE 03/05/2007  . Mila Homer 03/05/2007    Voncille Lo, PT 05/25/2014 1:43 PM Phone: [phone] Fax: [facility] [place], [place], [address] Phone: [phone]   Fax:  [phone]

## 2014-06-01 ENCOUNTER — Telehealth: Payer: Self-pay | Admitting: Nurse Practitioner

## 2014-06-01 ENCOUNTER — Ambulatory Visit: Payer: 59 | Admitting: Physical Therapy

## 2014-06-01 NOTE — Telephone Encounter (Signed)
Called and informed patient. 

## 2014-06-01 NOTE — Telephone Encounter (Signed)
Patient requesting Rx for Amoxicillin to be sent to [facility]. She has been using the nose spray but it isn't helping with her congestion. Patient is also losing her voice.

## 2014-06-01 NOTE — Telephone Encounter (Signed)
Please advise?  I'm sure that she will need OV just double checking that you didn't say you could send some in for her from her last OV?

## 2014-06-01 NOTE — Telephone Encounter (Signed)
Voice loss is usually caused by a virus. ABX do not help. I am happy to see her, but she may consider giving it more time. Viral illnesses usually take 2 weeks to clear.

## 2014-06-06 ENCOUNTER — Ambulatory Visit: Payer: 59 | Admitting: Physical Therapy

## 2014-06-06 DIAGNOSIS — M25611 Stiffness of right shoulder, not elsewhere classified: Secondary | ICD-10-CM

## 2014-06-06 DIAGNOSIS — R293 Abnormal posture: Secondary | ICD-10-CM

## 2014-06-06 DIAGNOSIS — M25511 Pain in right shoulder: Secondary | ICD-10-CM

## 2014-06-06 DIAGNOSIS — M542 Cervicalgia: Secondary | ICD-10-CM

## 2014-06-06 NOTE — Patient Instructions (Signed)
Strengthening: Isometric Flexion  Using wall for resistance, press right fist into ball using light pressure. Hold __5-10__ seconds. Repeat ___10_ times per set. Do _1___ sets per session. Do __2__ sessions per day.  SHOULDER: Abduction (Isometric)  Use wall as resistance. Press arm against pillow. Keep elbow straight. Hold _5-10__ seconds. _10__ reps per set, __2_ sets per day, _7__ days per week  Extension (Isometric)  Place left bent elbow and back of arm against wall. Press elbow against wall. Hold 5-10____ seconds. Repeat __10__ times. Do __2__ sessions per day.  Internal Rotation (Isometric)  Place palm of right fist against door frame, with elbow bent. Press fist against door frame. Hold _5-10___ seconds. Repeat __10__ times. Do __2__ sessions per day.  External Rotation (Isometric)  Place back of left fist against door frame, with elbow bent. Press fist against door frame. Hold _5-10___ seconds. Repeat __10__ times. Do _2___ sessions per day.  Copyright  VHI. All rights reserved.  ROM: Towel Stretch - with Interior Rotation  BE GENTLE Pull left arm up behind back by pulling towel up with other arm. Hold _30___ seconds. Repeat _3___ times per set. Do __1__ sets per session. Do __2__ sessions per day.  http://orth.exer.us/889   Copyright  VHI. All rights reserved.

## 2014-06-06 NOTE — Therapy (Signed)
[place], [place], [address] Phone: [phone]   Fax:  [phone]  Physical Therapy Treatment  Patient Details  Name: [name] MRN: [id number] Date of Birth: [birth year] Referring Provider:  Irene Pap, NP  Encounter Date: 06/06/2014      PT End of Session - 06/06/14 1315    Visit Number 5   Number of Visits 16   Date for PT Re-Evaluation 07/03/14   PT Start Time 8144   PT Stop Time 1233   PT Time Calculation (min) 46 min      Past Medical History  Diagnosis Date  . Wears dentures     top  . Wears glasses   . 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 Arthritis   . Allergy   . Migraines   . Anxiety   . Thyroid disease     Hx of     Past Surgical History  Procedure Laterality Date  . 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 Neck surgery  1990    rt ? parotidectomy  . Bladder surgery      bladder tac  . Bowel resection    . Abdominal hysterectomy  1991    partial  . Neck mass excision      rt-mass  . Dorsal compartment release Right 02/07/2013    Procedure: RIGHT WRIST DEQUERVAIN RELEASE;  Surgeon: Jolyn Nap, MD;  Location: [place];  Service: Orthopedics;  Laterality: Right;  . Thyroid surgery      bx  . Shoulder arthroscopy with open rotator cuff repair and distal clavicle acrominectomy Right 04/25/2014    Procedure: SHOULDER ARTHROSCOPY WITH MINI-OPEN ROTATOR CUFF REPAIR AND DISTAL CLAVICLE RESECTION;  Surgeon: Garald Balding, MD;  Location: [place];  Service: Orthopedics;  Laterality: Right;  . Subacromial decompression Right 04/25/2014    Procedure: SUBACROMIAL DECOMPRESSION;  Surgeon: Garald Balding, MD;  Location: [place];  Service: Orthopedics;  Laterality: Right;    There were no vitals filed for this visit.  Visit Diagnosis:  Pain in right shoulder  Stiffness of  right shoulder joint  Abnormal posture  Neck pain on right side      Subjective Assessment - 06/06/14 1313    Subjective The Dry needling really helped. Now there is just pain in my muscle. It was there before the surgery and is still there now. I think the doctor needs to look at my MRI again.    Currently in Pain? Yes   Pain Score 7   only with certain movements, otherwise 0/10   Pain Location Arm  anterior lateral upper arm   Pain Orientation Right   Pain Descriptors / Indicators Sharp   Pain Type Chronic pain   Aggravating Factors  reaching behind back, reaching up   Pain Relieving Factors hot shower            [facility] PT Assessment - 06/06/14 1159    AROM   Right/Left Shoulder Right   Right Shoulder Flexion 128 Degrees   Right Shoulder ABduction 134 Degrees   Right Shoulder Internal Rotation --  reech to T-10   Right Shoulder External Rotation --  reach to T 3                     [facility] Adult PT Treatment/Exercise - 06/06/14  1225    Shoulder Exercises: Supine   Other Supine Exercises cane pullovers x 20   Shoulder Exercises: Standing   External Rotation Right;10 reps   Theraband Level (Shoulder External Rotation) Level 1 (Yellow)   External Rotation Limitations increased pain   Internal Rotation Right;10 reps   Internal Rotation Limitations no pain   Row Right;Theraband   Theraband Level (Shoulder Row) Level 1 (Yellow)   Row Limitations increased pain   Shoulder Exercises: ROM/Strengthening   UBE (Upper Arm Bike) L 1 3 min forward 3 min back   Other ROM/Strengthening Exercises wall ladder x 3 flexion and abduction   Shoulder Exercises: Isometric Strengthening   Flexion 5X10"   Extension 5X10"   External Rotation 5X10"   Internal Rotation 5X10"   ABduction 5X10"   Shoulder Exercises: Stretch   Internal Rotation Stretch 30 seconds   Internal Rotation Stretch Limitations with towel   Wall Stretch - Flexion 5 reps                PT  Education - 06/06/14 1312    Education provided Yes   Education Details Gentle, minimal pain stretching and isometrics for HEP   Person(s) Educated Patient   Methods Explanation;Handout   Comprehension Verbalized understanding          PT Short Term Goals - 05/25/14 1337    PT SHORT TERM GOAL #1   Title Independent within protocol exercises.    Time 4   Period Weeks   Status On-going   PT SHORT TERM GOAL #2   Title she will report pain decresed 40% or more generally throughout the day.    Time 4   Period Weeks   Status On-going   PT SHORT TERM GOAL #3   Title She will improve active range LT shoulder to week 4 in protocol   Time 4   Period Weeks   Status On-going           PT Long Term Goals - 05/25/14 1337    PT LONG TERM GOAL #1   Title She will ba able to do all HEP issued as of the last visit   Time 8   Period Weeks   Status On-going   PT LONG TERM GOAL #2   Title she will report pain decreased 75% or more and become intermittant   Time 8   Period Weeks   Status On-going   PT LONG TERM GOAL #3   Title She will be able to move arm for normal selfcare.    Time 8   Period Weeks   Status On-going   PT LONG TERM GOAL #4   Title She will be able to participate in child care   Time 8   Period Weeks   Status On-going   PT LONG TERM GOAL #5   Title She will resume normal home tasks with min pain   Time 8   Period Weeks   Status On-going   PT LONG TERM GOAL #6   Title She will be able to lye on RT side with  mn to  no pain for 1-2 hours   Time 8   Period Weeks   Status On-going               Plan - 06/06/14 1217    Clinical Impression Statement Pt reports significant decrease in pain after dry needling treatment. She has been working on IR stretching and has improved her AROm however rates  reaching behind back at 7/10 in her anterior deltoid area. Pt is 6 weeks s/p arthroscopy and has attended limited visits due to transportation. Attempted yellow  band exercises however pt was unable to tolerate so pt instructed in isometrics for HEP with minumal increase in pain. Pt requires verbal cues to keep her stretching gentle and to not increase pain to 7/10.    PT Next Visit Plan Use caution with manual as pt reports severe increase in pain after manual IR stretching several visits ago. review isometrics, 4-6 week protocol as tolerated. encourage gentle stretching, add stretch for flexion and ER to HEP        Problem List Patient Active Problem List   Diagnosis Date Noted  . Cough variant not due to asthma 05/16/2014  . Essential hypertension, benign 05/16/2014  . Rotator cuff impingement syndrome of right shoulder 04/25/2014  . 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  . Right rotator cuff tear 04/25/2014  . Thyroiditis, subacute 12/22/2013  . Right thyroid nodule 12/22/2013  . Irritability 12/08/2013  . Arthritis 12/08/2013  . Joint pain 10/03/2013  . 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 Elevated BP 10/03/2013  . History of nephrectomy 08/10/2013  . Smoker 08/09/2013  . Paresthesia of both hands 08/09/2013  . TOBACCO ABUSE 03/05/2007  . Mila Homer 03/05/2007    Dorene Ar, PTA 06/06/2014, 1:27 PM  [place], [place], [address] Phone: [phone]   Fax:  [phone]

## 2014-06-08 ENCOUNTER — Ambulatory Visit: Payer: 59 | Attending: Orthopaedic Surgery

## 2014-06-08 DIAGNOSIS — M25511 Pain in right shoulder: Secondary | ICD-10-CM | POA: Insufficient documentation

## 2014-06-08 DIAGNOSIS — M25611 Stiffness of right shoulder, not elsewhere classified: Secondary | ICD-10-CM | POA: Diagnosis present

## 2014-06-08 NOTE — Patient Instructions (Signed)
From cabinet i issued corner stretch for ER and overhead reach RT arm 30 sec 2-3 reps 2x/day.

## 2014-06-08 NOTE — Therapy (Addendum)
[place], [place], [address] Phone: [phone]   Fax:  [phone]  Physical Therapy Treatment  Patient Details  Name: [name] MRN: [id number] Date of Birth: 10/02/1955 Referring Provider:  Irene Pap, NP  Encounter Date: 06/08/2014      PT End of Session - 06/08/14 1223    Visit Number 6   Number of Visits 16   Date for PT Re-Evaluation 07/03/14   PT Start Time 6962   PT Stop Time 1225   PT Time Calculation (min) 40 min   Activity Tolerance Patient tolerated treatment well   Behavior During Therapy [facility] for tasks assessed/performed      Past Medical History  Diagnosis Date  . Wears dentures     top  . Wears glasses   . 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 Arthritis   . Allergy   . Migraines   . Anxiety   . Thyroid disease     Hx of     Past Surgical History  Procedure Laterality Date  . 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 Neck surgery  1990    rt ? parotidectomy  . Bladder surgery      bladder tac  . Bowel resection    . Abdominal hysterectomy  1991    partial  . Neck mass excision      rt-mass  . Dorsal compartment release Right 02/07/2013    Procedure: RIGHT WRIST DEQUERVAIN RELEASE;  Surgeon: Jolyn Nap, MD;  Location: [facility];  Service: Orthopedics;  Laterality: Right;  . Thyroid surgery      bx  . Shoulder arthroscopy with open rotator cuff repair and distal clavicle acrominectomy Right 04/25/2014    Procedure: SHOULDER ARTHROSCOPY WITH MINI-OPEN ROTATOR CUFF REPAIR AND DISTAL CLAVICLE RESECTION;  Surgeon: Garald Balding, MD;  Location: [facility];  Service: Orthopedics;  Laterality: Right;  . Subacromial decompression Right 04/25/2014    Procedure: SUBACROMIAL DECOMPRESSION;  Surgeon: Garald Balding, MD;  Location: [facility];  Service: Orthopedics;  Laterality:  Right;    There were no vitals filed for this visit.  Visit Diagnosis:  Pain in right shoulder  Stiffness of right shoulder joint      Subjective Assessment - 06/08/14 1142    Subjective Rt shoulder fine  and with pressing on muscle I can reach higher. Wants MD to investigate the pain more.    Currently in Pain? Yes   Pain Score 2   severe with using arm   Pain Location Shoulder  medial deltoid   Pain Orientation Right   Pain Descriptors / Indicators Aching;Sharp   Pain Type Chronic pain   Aggravating Factors  reaching with arm   Pain Relieving Factors heat   Multiple Pain Sites No       Treatment was UBE L! 3 min forward and 3 min back.   Morton Amy for overhead range and corner stretch 30 sec x 2 RT  And for ER 30 sec x 2.   HEP reviewed with isometrics and IR behind back. She did these correctly.      Active flexion 128 degrees Abduction  136 degrees RT shoulder. She is able to reach T3 and T10  PT Education - 06/08/14 1223    Education provided Yes   Education Details corner stretch   Person(s) Educated Patient   Methods Explanation;Demonstration;Tactile cues;Verbal cues;Handout   Comprehension Returned demonstration          PT Short Term Goals - 06/08/14 1228    PT SHORT TERM GOAL #1   Title Independent within protocol exercises.    Status Achieved   PT SHORT TERM GOAL #2   Title she will report pain decresed 40% or more generally throughout the day.    Status On-going   PT SHORT TERM GOAL #3   Title She will improve active range LT shoulder to week 4 in protocol   Status Achieved           PT Long Term Goals - 05/25/14 1337    PT LONG TERM GOAL #1   Title She will ba able to do all HEP issued as of the last visit   Time 8   Period Weeks   Status On-going   PT LONG TERM GOAL #2   Title she will report pain decreased 75% or more and become intermittant   Time 8   Period Weeks   Status On-going   PT LONG  TERM GOAL #3   Title She will be able to move arm for normal selfcare.    Time 8   Period Weeks   Status On-going   PT LONG TERM GOAL #4   Title She will be able to participate in child care   Time 8   Period Weeks   Status On-going   PT LONG TERM GOAL #5   Title She will resume normal home tasks with min pain   Time 8   Period Weeks   Status On-going   PT LONG TERM GOAL #6   Title She will be able to lye on RT side with  mn to  no pain for 1-2 hours   Time 8   Period Weeks   Status On-going               Plan - 06/08/14 1226    Clinical Impression Statement She continues to report pain at middle deltoid and also anterior RT shoulder. She is able to do all exercise and understands not to stretch too hard. We will follow up after MD visit and see 2-4 visits then if pain not bette will dischar. Painnlimiting progress.   PT Frequency 2x / week   PT Duration 3 weeks   PT Next Visit Plan Continue strength and range as tolerated   PT Home Exercise Plan corner stretching   Consulted and Agree with Plan of Care Patient        Problem List Patient Active Problem List   Diagnosis Date Noted  . Cough variant not due to asthma 05/16/2014  . Essential hypertension, benign 05/16/2014  . Rotator cuff impingement syndrome of right shoulder 04/25/2014  . 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  . Right rotator cuff tear 04/25/2014  . Thyroiditis, subacute 12/22/2013  . Right thyroid nodule 12/22/2013  . Irritability 12/08/2013  . Arthritis 12/08/2013  . Joint pain 10/03/2013  . 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 Elevated BP 10/03/2013  . History of nephrectomy 08/10/2013  . Smoker 08/09/2013  . Paresthesia of both hands 08/09/2013  . TOBACCO ABUSE 03/05/2007  . Mila Homer 03/05/2007    Darrel Hoover PT 06/08/2014, 12:30 PM   Outpatient Rehabilitation  [place], [place], [address] Phone:  [phone]   Fax:  [phone]     PHYSICAL THERAPY DISCHARGE SUMMARY  Visits from Start of Care: 6  Current functional level related to goals / functional outcomes: See above   Remaining deficits: Since last seen she has had a closed manipulation of RT shoulder. See above for deficits as of last visit   Education / Equipment: HEP Plan:                                                    Patient goals were partially met. Patient is being discharged due to not returning since the last visit.  ?????   Darrel Hoover, PT   11/14/14    10:21 AM

## 2014-06-22 ENCOUNTER — Encounter: Payer: 59 | Admitting: Physical Therapy

## 2014-06-23 ENCOUNTER — Ambulatory Visit: Payer: 59 | Admitting: Internal Medicine

## 2014-07-03 ENCOUNTER — Other Ambulatory Visit: Payer: Self-pay | Admitting: Family Medicine

## 2014-07-03 MED ORDER — LORAZEPAM 0.5 MG PO TABS: 0.5000 mg | ORAL_TABLET | Freq: Three times a day (TID) | ORAL | Status: DC

## 2014-07-03 NOTE — Telephone Encounter (Signed)
Rf request for lorazepam.  Last OV was 05/16/14. No upcoming OV Last RX looks like 12/21/13. Please advise.

## 2014-07-03 NOTE — Telephone Encounter (Signed)
Rx faxed

## 2014-07-05 ENCOUNTER — Encounter: Payer: 59 | Admitting: Physical Therapy

## 2014-07-13 ENCOUNTER — Encounter: Payer: 59 | Admitting: Physical Therapy

## 2014-07-20 ENCOUNTER — Ambulatory Visit (INDEPENDENT_AMBULATORY_CARE_PROVIDER_SITE_OTHER): Payer: 59 | Admitting: Nurse Practitioner

## 2014-07-20 ENCOUNTER — Encounter: Payer: Self-pay | Admitting: Nurse Practitioner

## 2014-07-20 VITALS — BP 128/80 | HR 73 | Temp 98.3°F | Resp 16 | Ht 66.0 in | Wt 121.0 lb

## 2014-07-20 DIAGNOSIS — T63444A Toxic effect of venom of bees, undetermined, initial encounter: Secondary | ICD-10-CM

## 2014-07-20 DIAGNOSIS — L989 Disorder of the skin and subcutaneous tissue, unspecified: Secondary | ICD-10-CM | POA: Diagnosis not present

## 2014-07-20 DIAGNOSIS — T63441A Toxic effect of venom of bees, accidental (unintentional), initial encounter: Secondary | ICD-10-CM | POA: Insufficient documentation

## 2014-07-20 NOTE — Progress Notes (Signed)
Pre visit review using our clinic review tool, if applicable. No additional management support is needed unless otherwise documented below in the visit note. 

## 2014-07-20 NOTE — Progress Notes (Signed)
Subjective:     [name] is a 59 y.o. female presents after bee sting yesterday and has new concern of "irritated mole" on back. Bee sting: L upper arm, itching & painful. "yellow bee". Using 1 % cortisone without relief.  Mole: center of back, under bra strap. Pt reports "picking & squeezing" it-thought was pimple. Now it hurts. Covered with neosporin & band aid.    The following portions of the patient's history were reviewed and updated as appropriate: allergies, current medications, past medical history, past social history, past surgical history and problem list.  Review of Systems Pertinent items are noted in HPI.    Objective:    BP 128/80 mmHg  Pulse 73  Temp(Src) 98.3 F (36.8 C) (Temporal)  Resp 16  Ht 5\' 6"  (1.676 m)  Wt 121 lb (54.885 kg)  BMI 19.54 kg/m2  SpO2 98% General appearance: alert, cooperative, appears stated age and no distress Eyes: negative findings: lids and lashes normal, conjunctivae and sclerae normal and wearing glasses Skin: L upper arm: 3 cm area well marked pink area with central pale area about 0.5 cm. Mild swelling. no obvious retained insect parts. slightly warm. Back: center. Removed band-aid. no drainage. raised round lesion 24mm diameter with with flat border that has peeled & is red with telangiectasias. Does not appear infected. Neurologic: Grossly normal    Assessment:Plan   1. Skin lesion of back DD: Irritated mole, SCC - Ambulatory referral to Dermatology  2. Bee sting, undetermined intent, initial encounter See pt instructions for care  F/u November-BP

## 2014-07-20 NOTE — Patient Instructions (Signed)
Redness will persist for several days, but after 48 to 72 hours the redness should start to get smaller.  Itching can be treated with ice pack and benadryl cream. Pain can be treated with 200-400 mg ibuprophen. Keep clean to avoid secondary infection.  See dermatology for skin lesion on back. It has been a pleasure to partner with you in your healthcare! Bee, Wasp, or Hornet Sting Your caregiver has diagnosed you as having an insect sting. An insect sting appears as a red lump in the skin that sometimes has a tiny hole in the center, or it may have a stinger in the center of the wound. The most common stings are from wasps, hornets and bees. Individuals have different reactions to insect stings.  A normal reaction may cause pain, swelling, and redness around the sting site.  A localized allergic reaction may cause swelling and redness that extends beyond the sting site.  A large local reaction may continue to develop over the next 12 to 36 hours.  On occasion, the reactions can be severe (anaphylactic reaction). An anaphylactic reaction may cause wheezing; difficulty breathing; chest pain; fainting; raised, itchy, red patches on the skin; a sick feeling to your stomach (nausea); vomiting; cramping; or diarrhea. If you have had an anaphylactic reaction to an insect sting in the past, you are more likely to have one again. HOME CARE INSTRUCTIONS   With bee stings, a small sac of poison is left in the wound. Brushing across this with something such as a credit card, or anything similar, will help remove this and decrease the amount of the reaction. This same procedure will not help a wasp sting as they do not leave behind a stinger and poison sac.  Apply a cold compress for 10 to 20 minutes every hour for 1 to 2 days, depending on severity, to reduce swelling and itching.  To lessen pain, a paste made of water and baking soda may be rubbed on the bite or sting and left on for 5 minutes.  To  relieve itching and swelling, you may use take medication or apply medicated creams or lotions as directed.  Only take over-the-counter or prescription medicines for pain, discomfort, or fever as directed by your caregiver.  Wash the sting site daily with soap and water. Apply antibiotic ointment on the sting site as directed.  If you suffered a severe reaction:  If you did not require hospitalization, an adult will need to stay with you for 24 hours in case the symptoms return.  You may need to wear a medical bracelet or necklace stating the allergy.  You and your family need to learn when and how to use an anaphylaxis kit or epinephrine injection.  If you have had a severe reaction before, always carry your anaphylaxis kit with you. SEEK MEDICAL CARE IF:   None of the above helps within 2 to 3 days.  The area becomes red, warm, tender, and swollen beyond the area of the bite or sting.  You have an oral temperature above 102 F (38.9 C). SEEK IMMEDIATE MEDICAL CARE IF:  You have symptoms of an allergic reaction which are:  Wheezing.  Difficulty breathing.  Chest pain.  Lightheadedness or fainting.  Itchy, raised, red patches on the skin.  Nausea, vomiting, cramping or diarrhea. ANY OF THESE SYMPTOMS MAY REPRESENT A SERIOUS PROBLEM THAT IS AN EMERGENCY. Do not wait to see if the symptoms will go away. Get medical help right away. Call your local  emergency services (911 in [place] DO NOT drive yourself to the hospital. MAKE SURE YOU:   Understand these instructions.  Will watch your condition.  Will get help right away if you are not doing well or get worse. Document Released: 12/23/2004 Document Revised: 03/17/2011 Document Reviewed: 06/09/2009 [facility] Patient Information 2015 [place], [place]. This information is not intended to replace advice given to you by your health care provider. Make sure you discuss any questions you have with your health care provider.

## 2014-08-30 ENCOUNTER — Encounter (HOSPITAL_BASED_OUTPATIENT_CLINIC_OR_DEPARTMENT_OTHER): Payer: Self-pay | Admitting: *Deleted

## 2014-08-30 ENCOUNTER — Encounter (HOSPITAL_COMMUNITY)
Admission: RE | Admit: 2014-08-30 | Discharge: 2014-08-30 | Disposition: A | Discharge status: Home / Self Care | Payer: 59 | Source: Ambulatory Visit | Attending: Orthopaedic Surgery | Admitting: Orthopaedic Surgery

## 2014-08-30 ENCOUNTER — Other Ambulatory Visit: Payer: Self-pay

## 2014-08-30 DIAGNOSIS — Z8261 Family history of arthritis: Secondary | ICD-10-CM | POA: Diagnosis not present

## 2014-08-30 DIAGNOSIS — F17200 Nicotine dependence, unspecified, uncomplicated: Secondary | ICD-10-CM | POA: Diagnosis not present

## 2014-08-30 DIAGNOSIS — E069 Thyroiditis, unspecified: Secondary | ICD-10-CM | POA: Diagnosis not present

## 2014-08-30 DIAGNOSIS — L409 Psoriasis, unspecified: Secondary | ICD-10-CM | POA: Diagnosis not present

## 2014-08-30 DIAGNOSIS — Z882 Allergy status to sulfonamides status: Secondary | ICD-10-CM | POA: Diagnosis not present

## 2014-08-30 DIAGNOSIS — R454 Irritability and anger: Secondary | ICD-10-CM | POA: Diagnosis not present

## 2014-08-30 DIAGNOSIS — I129 Hypertensive chronic kidney disease with stage 1 through stage 4 chronic kidney disease, or unspecified chronic kidney disease: Secondary | ICD-10-CM | POA: Diagnosis not present

## 2014-08-30 DIAGNOSIS — F419 Anxiety disorder, unspecified: Secondary | ICD-10-CM | POA: Diagnosis not present

## 2014-08-30 DIAGNOSIS — J449 Chronic obstructive pulmonary disease, unspecified: Secondary | ICD-10-CM | POA: Diagnosis not present

## 2014-08-30 DIAGNOSIS — M7501 Adhesive capsulitis of right shoulder: Secondary | ICD-10-CM | POA: Diagnosis not present

## 2014-08-30 DIAGNOSIS — Z8249 Family history of ischemic heart disease and other diseases of the circulatory system: Secondary | ICD-10-CM | POA: Diagnosis not present

## 2014-08-30 DIAGNOSIS — Z888 Allergy status to other drugs, medicaments and biological substances status: Secondary | ICD-10-CM | POA: Diagnosis not present

## 2014-08-30 DIAGNOSIS — N189 Chronic kidney disease, unspecified: Secondary | ICD-10-CM | POA: Diagnosis not present

## 2014-08-30 DIAGNOSIS — R202 Paresthesia of skin: Secondary | ICD-10-CM | POA: Diagnosis not present

## 2014-08-30 DIAGNOSIS — R05 Cough: Secondary | ICD-10-CM | POA: Diagnosis not present

## 2014-08-30 DIAGNOSIS — E041 Nontoxic single thyroid nodule: Secondary | ICD-10-CM | POA: Diagnosis not present

## 2014-08-30 DIAGNOSIS — Z801 Family history of malignant neoplasm of trachea, bronchus and lung: Secondary | ICD-10-CM | POA: Diagnosis not present

## 2014-08-30 DIAGNOSIS — M1990 Unspecified osteoarthritis, unspecified site: Secondary | ICD-10-CM | POA: Diagnosis not present

## 2014-08-30 DIAGNOSIS — Z90710 Acquired absence of both cervix and uterus: Secondary | ICD-10-CM | POA: Diagnosis not present

## 2014-08-30 DIAGNOSIS — Z833 Family history of diabetes mellitus: Secondary | ICD-10-CM | POA: Diagnosis not present

## 2014-08-30 LAB — BASIC METABOLIC PANEL
ANION GAP: 6 (ref 5–15)
BUN: 15 mg/dL (ref 6–20)
CALCIUM: 9.7 mg/dL (ref 8.9–10.3)
CO2: 31 mmol/L (ref 22–32)
Chloride: 104 mmol/L (ref 101–111)
Creatinine, Ser: 1.14 mg/dL — ABNORMAL HIGH (ref 0.44–1.00)
GFR, EST AFRICAN AMERICAN: 59 mL/min — AB (ref >60)
GFR, EST NON AFRICAN AMERICAN: 51 mL/min — AB (ref >60)
Glucose, Bld: 86 mg/dL (ref 65–99)
Potassium: 4.8 mmol/L (ref 3.5–5.1)
Sodium: 141 mmol/L (ref 135–145)

## 2014-08-30 NOTE — H&P (Signed)
[name], MD   Biagio Borg, PA-C [address], [place], [place]  [address]                             [phone]   ORTHOPAEDIC HISTORY & PHYSICAL  [name] MRN:  [id number] DOB/SEX:  08-04-1955/female  CHIEF COMPLAINT:  Painful right shoulder  HISTORY: [name] is four months status post rotator cuff tear repair of her right shoulder.  She has been doing her exercises at home, but has developed adhesive capsulitis and notes that she is uncomfortable.  To some extent the [place] has made a difference.  She is a little concerned about her insurance as she changes policies within [facility] starting September 3rd, but has already met her deductible under the existing policy.     [name] was last seen on June 6th at which point she was six weeks postop and she did lack some degrees of overhead motion.  She has not had any fevers or chills or numbness or tingling.    PAST MEDICAL HISTORY: Patient Active Problem List   Diagnosis Date Noted  . Bee sting 07/20/2014  . Skin lesion of back 07/20/2014  . Cough variant not due to asthma 05/16/2014  . Essential hypertension, benign 05/16/2014  . Rotator cuff impingement syndrome of right shoulder 04/25/2014  . 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  . Right rotator cuff tear 04/25/2014  . Thyroiditis, subacute 12/22/2013  . Right thyroid nodule 12/22/2013  . Irritability 12/08/2013  . Arthritis 12/08/2013  . Joint pain 10/03/2013  . 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 Elevated BP 10/03/2013  . History of nephrectomy 08/10/2013  . Smoker 08/09/2013  . Paresthesia of both hands 08/09/2013  . TOBACCO ABUSE 03/05/2007  . C O P D 03/05/2007   Past Medical History  Diagnosis Date  . Wears dentures     top  . Wears glasses   . 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 Arthritis   . Allergy   . Migraines   . Anxiety   . Thyroid disease    Hx of   . Hypertension   . Chronic kidney disease     kidney donor, has only one kidney   Past Surgical History  Procedure Laterality Date  . 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 Neck surgery  1990    rt ? parotidectomy  . Bladder surgery      bladder tac  . Bowel resection    . Abdominal hysterectomy  1991    partial  . Neck mass excision      rt-mass  . Dorsal compartment release Right 02/07/2013    Procedure: RIGHT WRIST DEQUERVAIN RELEASE;  Surgeon: Jolyn Nap, MD;  Location: [facility];  Service: Orthopedics;  Laterality: Right;  . Thyroid surgery      bx  . Shoulder arthroscopy with open rotator cuff repair and distal clavicle acrominectomy Right 04/25/2014    Procedure: SHOULDER ARTHROSCOPY WITH MINI-OPEN ROTATOR CUFF REPAIR AND DISTAL CLAVICLE RESECTION;  Surgeon: Garald Balding, MD;  Location: [facility];  Service: Orthopedics;  Laterality: Right;  . Subacromial decompression Right 04/25/2014    Procedure: SUBACROMIAL DECOMPRESSION;  Surgeon: Garald Balding, MD;  Location: [facility];  Service: Orthopedics;  Laterality: Right;  MEDICATIONS:   No prescriptions prior to admission    ALLERGIES:   Allergies  Allergen Reactions  . Celebrex [Celecoxib] Hives  . Dilaudid [Hydromorphone] Nausea And Vomiting    Severe N/V  . Ibuprofen     Not supposed to take everyday due to only having one kidney  . Lactose Intolerance (Gi) Diarrhea    cramping  . 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 Other Nausea And Vomiting    Most pain meds and antibiotics  . Sudafed [Pseudoephedrine Hcl]     Makes "high"  . Sulfonamide Derivatives Rash    REVIEW OF SYSTEMS:  A comprehensive review of systems was negative except for: Cardiovascular: positive for hypertension Neurological: positive for migraines Behavioral/Psych: positive for nervous tension Endocrine: positive for thyroid  problems   FAMILY HISTORY:   Family History  Problem Relation Age of Onset  . Arthritis Mother     rheumatoid  . Hypertension Mother   . Heart disease Mother     Irregular heart beat   . Arrhythmia Mother   . Cancer Father     Lung  . Hypertension Sister   . Aneurysm Brother 11    brain  . Heart disease Maternal Aunt   . Arthritis Maternal Uncle     rheumatoid  . Heart disease Maternal Grandmother   . Diabetes Maternal Uncle     SOCIAL HISTORY:   Social History  Substance Use Topics  . Smoking status: Current Every Day Smoker -- 0.50 packs/day for 40 years  . Smokeless tobacco: Never Used  . Alcohol Use: No      EXAMINATION: Vital signs in last 24 hours:    Head is normocephalic.   Eyes:  Pupils equal, round and reactive to light and accommodation.  Extraocular intact. ENT: Ears, nose, and throat were benign.   Neck: supple, no bruits were noted.   Chest: good expansion.   Lungs: essentially clear.   Cardiac: regular rhythm and rate, normal S1, S2.  No murmurs appreciated. Pulses :  2+ bilateral and symmetric in upper extremities. Abdomen is scaphoid, soft, nontender, no masses palpable, normal bowel sounds present. CNS:  He is oriented x3 and cranial nerves II-XII grossly intact. Breast, rectal, and genital exams: not performed and not indicated for an orthopedic evaluation. Musculoskeletal: On exam she lacked about 45 degrees to full overhead motion in flexion and in abduction 90 degrees.  She could not touch the middle of her back as it was tight, but otherwise she did not seem to have any trouble.  The incision is healed nicely and no particular discomfort just to touch her shoulder    ASSESSMENT: right shoulder arthrofibrosis  Past Medical History  Diagnosis Date  . Wears dentures     top  . Wears glasses   . 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 Arthritis   . Allergy   . Migraines   . Anxiety   . Thyroid disease     Hx of    . Hypertension   . Chronic kidney disease     kidney donor, has only one kidney    PLAN: Plan for right shoulder manipulation  The procedure,  risks, and benefits of surgery were presented and reviewed. The risks including but not limited to infection, blood clots, vascular and nerve injury, stiffness,  among others were discussed. The patient acknowledged the explanation, agreed to proceed.   Aaron Edelman  Docia Barrier, PA-C [place] [phone]  08/30/2014 5:41 PM

## 2014-08-30 NOTE — Anesthesia Preprocedure Evaluation (Addendum)
Anesthesia Evaluation  Patient identified by MRN, date of birth, ID band Patient awake    Reviewed: Allergy & Precautions, NPO status , Patient's Chart, lab work & pertinent test results  History of Anesthesia Complications Negative for: history of anesthetic complications  Airway Mallampati: II  TM Distance: >3 FB Neck ROM: Full    Dental no notable dental hx. (+) Dental Advisory Given, Edentulous Upper, Upper Dentures   Pulmonary COPDCurrent Smoker,  breath sounds clear to auscultation  Pulmonary exam normal       Cardiovascular hypertension, Pt. on medications Normal cardiovascular exam+ dysrhythmias (RBBB) Rhythm:Regular Rate:Normal  Normal stress echo 2015   Neuro/Psych  Headaches, PSYCHIATRIC DISORDERS Anxiety    GI/Hepatic negative GI ROS, Neg liver ROS,   Endo/Other  negative endocrine ROS  Renal/GU Renal disease  negative genitourinary   Musculoskeletal  (+) Arthritis -, Osteoarthritis,    Abdominal   Peds negative pediatric ROS (+)  Hematology negative hematology ROS (+)   Anesthesia Other Findings   Reproductive/Obstetrics negative OB ROS                            Anesthesia Physical Anesthesia Plan  ASA: II  Anesthesia Plan: General   Post-op Pain Management: GA combined w/ Regional for post-op pain   Induction: Intravenous  Airway Management Planned: Mask  Additional Equipment:   Intra-op Plan:   Post-operative Plan:   Informed Consent: I have reviewed the patients History and Physical, chart, labs and discussed the procedure including the risks, benefits and alternatives for the proposed anesthesia with the patient or authorized representative who has indicated his/her understanding and acceptance.   Dental advisory given  Plan Discussed with:   Anesthesia Plan Comments:        Anesthesia Quick Evaluation

## 2014-08-30 NOTE — Progress Notes (Signed)
Patient will go to [facility] for BMET and EKG, orders faxed.

## 2014-08-31 ENCOUNTER — Ambulatory Visit (HOSPITAL_BASED_OUTPATIENT_CLINIC_OR_DEPARTMENT_OTHER)
Admission: RE | Admit: 2014-08-31 | Discharge: 2014-08-31 | Disposition: A | Discharge status: Home / Self Care | Payer: 59 | Source: Ambulatory Visit | Attending: Orthopaedic Surgery | Admitting: Orthopaedic Surgery

## 2014-08-31 ENCOUNTER — Encounter (HOSPITAL_BASED_OUTPATIENT_CLINIC_OR_DEPARTMENT_OTHER)
Admission: RE | Disposition: A | Discharge status: Home / Self Care | Payer: Self-pay | Source: Ambulatory Visit | Attending: Orthopaedic Surgery

## 2014-08-31 ENCOUNTER — Ambulatory Visit (HOSPITAL_BASED_OUTPATIENT_CLINIC_OR_DEPARTMENT_OTHER): Payer: 59 | Admitting: Certified Registered"

## 2014-08-31 ENCOUNTER — Encounter (HOSPITAL_BASED_OUTPATIENT_CLINIC_OR_DEPARTMENT_OTHER): Payer: Self-pay

## 2014-08-31 DIAGNOSIS — M7501 Adhesive capsulitis of right shoulder: Secondary | ICD-10-CM | POA: Insufficient documentation

## 2014-08-31 DIAGNOSIS — E041 Nontoxic single thyroid nodule: Secondary | ICD-10-CM | POA: Insufficient documentation

## 2014-08-31 DIAGNOSIS — Z882 Allergy status to sulfonamides status: Secondary | ICD-10-CM | POA: Insufficient documentation

## 2014-08-31 DIAGNOSIS — R05 Cough: Secondary | ICD-10-CM | POA: Insufficient documentation

## 2014-08-31 DIAGNOSIS — Z8249 Family history of ischemic heart disease and other diseases of the circulatory system: Secondary | ICD-10-CM | POA: Insufficient documentation

## 2014-08-31 DIAGNOSIS — M1990 Unspecified osteoarthritis, unspecified site: Secondary | ICD-10-CM | POA: Insufficient documentation

## 2014-08-31 DIAGNOSIS — N189 Chronic kidney disease, unspecified: Secondary | ICD-10-CM | POA: Insufficient documentation

## 2014-08-31 DIAGNOSIS — Z833 Family history of diabetes mellitus: Secondary | ICD-10-CM | POA: Insufficient documentation

## 2014-08-31 DIAGNOSIS — Z801 Family history of malignant neoplasm of trachea, bronchus and lung: Secondary | ICD-10-CM | POA: Insufficient documentation

## 2014-08-31 DIAGNOSIS — F17200 Nicotine dependence, unspecified, uncomplicated: Secondary | ICD-10-CM | POA: Insufficient documentation

## 2014-08-31 DIAGNOSIS — L409 Psoriasis, unspecified: Secondary | ICD-10-CM | POA: Insufficient documentation

## 2014-08-31 DIAGNOSIS — Z8261 Family history of arthritis: Secondary | ICD-10-CM | POA: Insufficient documentation

## 2014-08-31 DIAGNOSIS — Z888 Allergy status to other drugs, medicaments and biological substances status: Secondary | ICD-10-CM | POA: Insufficient documentation

## 2014-08-31 DIAGNOSIS — R454 Irritability and anger: Secondary | ICD-10-CM | POA: Insufficient documentation

## 2014-08-31 DIAGNOSIS — J449 Chronic obstructive pulmonary disease, unspecified: Secondary | ICD-10-CM | POA: Insufficient documentation

## 2014-08-31 DIAGNOSIS — Z90710 Acquired absence of both cervix and uterus: Secondary | ICD-10-CM | POA: Insufficient documentation

## 2014-08-31 DIAGNOSIS — I129 Hypertensive chronic kidney disease with stage 1 through stage 4 chronic kidney disease, or unspecified chronic kidney disease: Secondary | ICD-10-CM | POA: Insufficient documentation

## 2014-08-31 DIAGNOSIS — F419 Anxiety disorder, unspecified: Secondary | ICD-10-CM | POA: Insufficient documentation

## 2014-08-31 DIAGNOSIS — M7500 Adhesive capsulitis of unspecified shoulder: Secondary | ICD-10-CM | POA: Diagnosis present

## 2014-08-31 DIAGNOSIS — R202 Paresthesia of skin: Secondary | ICD-10-CM | POA: Insufficient documentation

## 2014-08-31 DIAGNOSIS — E069 Thyroiditis, unspecified: Secondary | ICD-10-CM | POA: Insufficient documentation

## 2014-08-31 HISTORY — DX: Essential (primary) hypertension: I10

## 2014-08-31 HISTORY — DX: Chronic kidney disease, unspecified: N18.9

## 2014-08-31 HISTORY — PX: SHOULDER CLOSED REDUCTION: SHX1051

## 2014-08-31 LAB — POCT HEMOGLOBIN-HEMACUE: HEMOGLOBIN: 13 g/dL (ref 12.0–15.0)

## 2014-08-31 SURGERY — MANIPULATION, JOINT, SHOULDER, WITH ANESTHESIA
Anesthesia: General | Site: Shoulder | Laterality: Right

## 2014-08-31 MED ORDER — BUPIVACAINE HCL (PF) 0.25 % IJ SOLN
INTRAMUSCULAR | Status: AC
  Filled 2014-08-31: qty 30

## 2014-08-31 MED ORDER — MIDAZOLAM HCL 2 MG/2ML IJ SOLN
INTRAMUSCULAR | Status: AC
  Filled 2014-08-31: qty 2

## 2014-08-31 MED ORDER — ONDANSETRON HCL 4 MG/2ML IJ SOLN
INTRAMUSCULAR | Status: DC | PRN
  Administered 2014-08-31: 4 mg via INTRAVENOUS

## 2014-08-31 MED ORDER — LACTATED RINGERS IV SOLN
INTRAVENOUS | Status: DC
  Administered 2014-08-31: 07:00:00 via INTRAVENOUS

## 2014-08-31 MED ORDER — ONDANSETRON HCL 4 MG/2ML IJ SOLN: 4.0000 mg | Freq: Once | INTRAMUSCULAR | Status: DC | PRN

## 2014-08-31 MED ORDER — PROPOFOL 500 MG/50ML IV EMUL
INTRAVENOUS | Status: AC
  Filled 2014-08-31: qty 50

## 2014-08-31 MED ORDER — METHYLPREDNISOLONE ACETATE 80 MG/ML IJ SUSP
INTRAMUSCULAR | Status: DC | PRN
  Administered 2014-08-31: 80 mg via INTRA_ARTICULAR

## 2014-08-31 MED ORDER — ROPIVACAINE HCL 5 MG/ML IJ SOLN
INTRAMUSCULAR | Status: DC | PRN
  Administered 2014-08-31: 25 mL via EPIDURAL

## 2014-08-31 MED ORDER — FENTANYL CITRATE (PF) 100 MCG/2ML IJ SOLN
50.0000 ug | INTRAMUSCULAR | Status: DC | PRN
  Administered 2014-08-31: 50 ug via INTRAVENOUS

## 2014-08-31 MED ORDER — OXYCODONE HCL 5 MG PO TABS: 5.0000 mg | ORAL_TABLET | ORAL | Status: DC | PRN

## 2014-08-31 MED ORDER — LIDOCAINE HCL (CARDIAC) 20 MG/ML IV SOLN
INTRAVENOUS | Status: DC | PRN
Start: 2014-08-31 — End: 2014-08-31
  Administered 2014-08-31: 30 mg via INTRAVENOUS

## 2014-08-31 MED ORDER — BUPIVACAINE HCL (PF) 0.25 % IJ SOLN
INTRAMUSCULAR | Status: DC | PRN
  Administered 2014-08-31: 4 mL via INTRA_ARTICULAR

## 2014-08-31 MED ORDER — BUPIVACAINE-EPINEPHRINE (PF) 0.25% -1:200000 IJ SOLN
INTRAMUSCULAR | Status: AC
  Filled 2014-08-31: qty 30

## 2014-08-31 MED ORDER — SCOPOLAMINE 1 MG/3DAYS TD PT72: 1.0000 | MEDICATED_PATCH | Freq: Once | TRANSDERMAL | Status: DC | PRN

## 2014-08-31 MED ORDER — MIDAZOLAM HCL 2 MG/2ML IJ SOLN
1.0000 mg | INTRAMUSCULAR | Status: DC | PRN
  Administered 2014-08-31: 1 mg via INTRAVENOUS

## 2014-08-31 MED ORDER — PROPOFOL 10 MG/ML IV BOLUS
INTRAVENOUS | Status: DC | PRN
  Administered 2014-08-31: 120 mg via INTRAVENOUS

## 2014-08-31 MED ORDER — FENTANYL CITRATE (PF) 100 MCG/2ML IJ SOLN
INTRAMUSCULAR | Status: AC
  Filled 2014-08-31: qty 2

## 2014-08-31 MED ORDER — BUPIVACAINE-EPINEPHRINE (PF) 0.5% -1:200000 IJ SOLN
INTRAMUSCULAR | Status: AC
  Filled 2014-08-31: qty 30

## 2014-08-31 MED ORDER — BUPIVACAINE HCL (PF) 0.5 % IJ SOLN
INTRAMUSCULAR | Status: AC
  Filled 2014-08-31: qty 30

## 2014-08-31 MED ORDER — FENTANYL CITRATE (PF) 100 MCG/2ML IJ SOLN: 25.0000 ug | INTRAMUSCULAR | Status: DC | PRN

## 2014-08-31 MED ORDER — EPHEDRINE SULFATE 50 MG/ML IJ SOLN
INTRAMUSCULAR | Status: DC | PRN
  Administered 2014-08-31: 10 mg via INTRAVENOUS

## 2014-08-31 MED ORDER — SODIUM CHLORIDE 0.9 % IV SOLN: 75.0000 mL/h | INTRAVENOUS | Status: DC

## 2014-08-31 MED ORDER — GLYCOPYRROLATE 0.2 MG/ML IJ SOLN: 0.2000 mg | Freq: Once | INTRAMUSCULAR | Status: DC | PRN

## 2014-08-31 MED ORDER — METHYLPREDNISOLONE ACETATE 80 MG/ML IJ SUSP
INTRAMUSCULAR | Status: AC
  Filled 2014-08-31: qty 1

## 2014-08-31 SURGICAL SUPPLY — 14 items
BANDAGE ADH SHEER 1  50/CT (GAUZE/BANDAGES/DRESSINGS) ×3 IMPLANT
GLOVE BIOGEL PI IND STRL 8 (GLOVE) ×1 IMPLANT
GLOVE BIOGEL PI INDICATOR 8 (GLOVE)
GLOVE ECLIPSE 8.0 STRL XLNG CF (GLOVE) ×1 IMPLANT
NDL SAFETY ECLIPSE 18X1.5 (NEEDLE) IMPLANT
NDL SPNL 18GX3.5 QUINCKE PK (NEEDLE) IMPLANT
NEEDLE HYPO 18GX1.5 SHARP (NEEDLE) ×3
NEEDLE HYPO 22GX1.5 SAFETY (NEEDLE) ×2 IMPLANT
NEEDLE SPNL 18GX3.5 QUINCKE PK (NEEDLE) IMPLANT
PAD ALCOHOL SWAB (MISCELLANEOUS) ×2 IMPLANT
SLING ARM FOAM STRAP MED (SOFTGOODS) ×2 IMPLANT
SPONGE GAUZE 4X4 12PLY STER LF (GAUZE/BANDAGES/DRESSINGS) IMPLANT
SWABSTICK POVIDONE IODINE SNGL (MISCELLANEOUS) ×2 IMPLANT
SYR CONTROL 10ML LL (SYRINGE) ×2 IMPLANT

## 2014-08-31 NOTE — Anesthesia Procedure Notes (Addendum)
Anesthesia Regional Block:  Interscalene brachial plexus block  Pre-Anesthetic Checklist: ,, timeout performed, Correct Patient, Correct Site, Correct Laterality, Correct Procedure, Correct Position, site marked, Risks and benefits discussed,  Surgical consent,  Pre-op evaluation,  At surgeon's request and post-op pain management  Laterality: Right  Prep: chloraprep       Needles:  Injection technique: Single-shot  Needle Type: Stimiplex          Additional Needles:  Procedures: ultrasound guided (picture in chart) Interscalene brachial plexus block Narrative:  Injection made incrementally with aspirations every 5 mL.  Performed by: Personally  Anesthesiologist: Lauretta Grill  Additional Notes: Patient tolerated the procedure well without complications.  -MJudd   Procedure Name: MAC Date/Time: 08/31/2014 8:38 AM Performed by: Satoshi Kalas D Pre-anesthesia Checklist: Patient identified, Emergency Drugs available, Suction available, Patient being monitored and Timeout performed Patient Re-evaluated:Patient Re-evaluated prior to inductionOxygen Delivery Method: Simple face mask

## 2014-08-31 NOTE — Op Note (Signed)
PATIENT ID:      JASON HAUGE  MRN:     [id number] DOB/AGE:    60/28/1957 / 59 y.o.       OPERATIVE REPORT    DATE OF PROCEDURE:  08/31/2014       PREOPERATIVE DIAGNOSIS:   RIGHT SHOULDER ADHESIVE CAPSULITIS                                                       Estimated body mass index is 19.05 kg/(m^2) as calculated from the following:   Height as of this encounter: 5\' 6"  (1.676 m).   Weight as of this encounter: 53.524 kg (118 lb).     POSTOPERATIVE DIAGNOSIS:   Same                                                                   Estimated body mass index is 19.05 kg/(m^2) as calculated from the following:   Height as of this encounter: 5\' 6"  (1.676 m).   Weight as of this encounter: 53.524 kg (118 lb).     PROCEDURE:  Procedure(s): CLOSED MANIPULATION RIGHT SHOULDER     SURGEON:  [name], MD    ASSISTANT:  NONE         ANESTHESIA: regional and general     DRAINS: none :      TOURNIQUET TIME: * No tourniquets in log *    COMPLICATIONS:  None   CONDITION:  stable  PROCEDURE IN DETAIL: [id number]   Taneah Masri W 08/31/2014, 7:48 AM

## 2014-08-31 NOTE — Progress Notes (Signed)
Assisted Dr. Imogene Burn with right, ultrasound guided, supraclavicular block. Side rails up, monitors on throughout procedure. See vital signs in flow sheet. Tolerated Procedure well.

## 2014-08-31 NOTE — H&P (Signed)
  The recent History & Physical has been reviewed. I have personally examined the patient today. There is no interval change to the documented History & Physical. The patient would like to proceed with the procedure.  [name] W 08/31/2014,  7:32 AM

## 2014-08-31 NOTE — Transfer of Care (Signed)
Immediate Anesthesia Transfer of Care Note  Patient: ELLYSSA ZAGAL  Procedure(s) Performed: Procedure(s): CLOSED MANIPULATION SHOULDER (Right)  Patient Location: PACU  Anesthesia Type:General  Level of Consciousness: awake, alert , oriented and patient cooperative  Airway & Oxygen Therapy: Patient Spontanous Breathing and Patient connected to face mask oxygen  Post-op Assessment: Report given to RN and Post -op Vital signs reviewed and stable  Post vital signs: Reviewed and stable  Last Vitals:  Filed Vitals:   08/31/14 0800  BP: 141/81  Pulse: 72  Temp:   Resp:     Complications: No apparent anesthesia complications

## 2014-08-31 NOTE — Discharge Instructions (Signed)
°  Post Anesthesia Home Care Instructions ° °Activity: °Get plenty of rest for the remainder of the day. A responsible adult should stay with you for 24 hours following the procedure.  °For the next 24 hours, DO NOT: °-Drive a car °-Operate machinery °-Drink alcoholic beverages °-Take any medication unless instructed by your physician °-Make any legal decisions or sign important papers. ° °Meals: °Start with liquid foods such as gelatin or soup. Progress to regular foods as tolerated. Avoid greasy, spicy, heavy foods. If nausea and/or vomiting occur, drink only clear liquids until the nausea and/or vomiting subsides. Call your physician if vomiting continues. ° °Special Instructions/Symptoms: °Your throat may feel dry or sore from the anesthesia or the breathing tube placed in your throat during surgery. If this causes discomfort, gargle with warm salt water. The discomfort should disappear within 24 hours. ° °If you had a scopolamine patch placed behind your ear for the management of post- operative nausea and/or vomiting: ° °1. The medication in the patch is effective for 72 hours, after which it should be removed.  Wrap patch in a tissue and discard in the trash. Wash hands thoroughly with soap and water. °2. You may remove the patch earlier than 72 hours if you experience unpleasant side effects which may include dry mouth, dizziness or visual disturbances. °3. Avoid touching the patch. Wash your hands with soap and water after contact with the patch. °  °Regional Anesthesia Blocks ° °1. Numbness or the inability to move the "blocked" extremity may last from 3-48 hours after placement. The length of time depends on the medication injected and your individual response to the medication. If the numbness is not going away after 48 hours, call your surgeon. ° °2. The extremity that is blocked will need to be protected until the numbness is gone and the  Strength has returned. Because you cannot feel it, you will need  to take extra care to avoid injury. Because it may be weak, you may have difficulty moving it or using it. You may not know what position it is in without looking at it while the block is in effect. ° °3. For blocks in the legs and feet, returning to weight bearing and walking needs to be done carefully. You will need to wait until the numbness is entirely gone and the strength has returned. You should be able to move your leg and foot normally before you try and bear weight or walk. You will need someone to be with you when you first try to ensure you do not fall and possibly risk injury. ° °4. Bruising and tenderness at the needle site are common side effects and will resolve in a few days. ° °5. Persistent numbness or new problems with movement should be communicated to the surgeon or the [facility] ([phone])/ [facility] ([phone]). °

## 2014-08-31 NOTE — Op Note (Signed)
NAME:  [name], [name]                  ACCOUNT NO.:  MEDICAL RECORD NO.:  [id number]  LOCATION:                                 FACILITY:  PHYSICIAN:  Vonna Kotyk. Natthew Marlatt, M.D.DATE OF BIRTH:  [birth year]  DATE OF PROCEDURE: DATE OF DISCHARGE:                              OPERATIVE REPORT   PREOPERATIVE DIAGNOSIS:  Adhesive capsulitis of right shoulder.  POSTOPERATIVE DIAGNOSIS:  Adhesive capsulitis of right shoulder.  PROCEDURE:  Manipulation, right shoulder.  SURGEON:  Vonna Kotyk. [name], M.D.  ANESTHESIA:  Interscalene nerve block with general mask.  COMPLICATIONS:  None.  HISTORY:  A 59 year old female who is approximately 4 months status post arthroscopic SAD DCR mini open rotator cuff tear repaired.  Despite physical therapy, she has developed adhesive capsulitis to the point where she is compromised in her activities.  She is now to have close manipulation.  PROCEDURE IN DETAIL:  [name] was met in the holding area.  I identified the right shoulder as the appropriate operative site.  She did receive a preoperative interscalene nerve block per Anesthesia.  The patient was then transported to room #5 and placed under general mask anesthesia while on the operating stretcher.  Gentle manipulation of the shoulder was performed.  She had approximately 110-120 degrees of forward flexion and about 80 degrees of abduction, pre-manipulation.  There was audible release of adhesions. __________ flexion and abduct her over 100 degrees.  I also gently manipulated the shoulder with internal-external rotation.  The shoulder was then prepped with alcohol and Betadine.  I injected 4 mL of 0.25% Marcaine without epinephrine and 80 mg Depo-Medrol.  PLAN:  Outpatient physical therapy in office in the next 2 weeks. Oxycodone for pain.     Vonna Kotyk. [name], M.D.     PWW/MEDQ  D:  08/31/2014  T:  08/31/2014  Job:  [id number]

## 2014-08-31 NOTE — Anesthesia Postprocedure Evaluation (Signed)
  Anesthesia Post-op Note  Patient: [name]  Procedure(s) Performed: Procedure(s) (LRB): CLOSED MANIPULATION SHOULDER (Right)  Patient Location: PACU  Anesthesia Type: General  Level of Consciousness: awake and alert   Airway and Oxygen Therapy: Patient Spontanous Breathing  Post-op Pain: mild  Post-op Assessment: Post-op Vital signs reviewed, Patient's Cardiovascular Status Stable, Respiratory Function Stable, Patent Airway and No signs of Nausea or vomiting  Last Vitals:  Filed Vitals:   08/31/14 0815  BP: 139/75  Pulse: 69  Temp:   Resp: 15    Post-op Vital Signs: stable   Complications: No apparent anesthesia complications

## 2014-09-01 ENCOUNTER — Encounter (HOSPITAL_BASED_OUTPATIENT_CLINIC_OR_DEPARTMENT_OTHER): Payer: Self-pay | Admitting: Orthopaedic Surgery

## 2014-09-04 NOTE — Addendum Note (Signed)
Addendum  created 09/04/14 1027 by Tawni Millers, CRNA   Modules edited: Charges VN

## 2014-10-02 ENCOUNTER — Encounter: Payer: Self-pay | Admitting: Family Medicine

## 2014-10-02 ENCOUNTER — Telehealth: Payer: Self-pay | Admitting: Nurse Practitioner

## 2014-10-02 ENCOUNTER — Ambulatory Visit (INDEPENDENT_AMBULATORY_CARE_PROVIDER_SITE_OTHER): Payer: 59 | Admitting: Family Medicine

## 2014-10-02 VITALS — BP 132/83 | HR 75 | Temp 98.4°F | Resp 18 | Ht 66.0 in | Wt 117.0 lb

## 2014-10-02 DIAGNOSIS — R519 Headache, unspecified: Secondary | ICD-10-CM

## 2014-10-02 DIAGNOSIS — E061 Subacute thyroiditis: Secondary | ICD-10-CM

## 2014-10-02 DIAGNOSIS — Z23 Encounter for immunization: Secondary | ICD-10-CM

## 2014-10-02 DIAGNOSIS — R202 Paresthesia of skin: Secondary | ICD-10-CM

## 2014-10-02 DIAGNOSIS — R51 Headache: Secondary | ICD-10-CM | POA: Diagnosis not present

## 2014-10-02 DIAGNOSIS — I10 Essential (primary) hypertension: Secondary | ICD-10-CM

## 2014-10-02 LAB — VITAMIN D 25 HYDROXY (VIT D DEFICIENCY, FRACTURES): VITD: 58.69 ng/mL (ref 30.00–100.00)

## 2014-10-02 LAB — VITAMIN B12: Vitamin B-12: 388 pg/mL (ref 211–911)

## 2014-10-02 LAB — T4, FREE: Free T4: 1.06 ng/dL (ref 0.60–1.60)

## 2014-10-02 LAB — COMPREHENSIVE METABOLIC PANEL
ALK PHOS: 97 U/L (ref 39–117)
ALT: 14 U/L (ref 0–35)
AST: 20 U/L (ref 0–37)
Albumin: 4.4 g/dL (ref 3.5–5.2)
BUN: 19 mg/dL (ref 6–23)
CHLORIDE: 106 meq/L (ref 96–112)
CO2: 29 meq/L (ref 19–32)
Calcium: 9.9 mg/dL (ref 8.4–10.5)
Creatinine, Ser: 0.99 mg/dL (ref 0.40–1.20)
GFR: 60.78 mL/min (ref >60.00)
GLUCOSE: 87 mg/dL (ref 70–99)
POTASSIUM: 4.1 meq/L (ref 3.5–5.1)
SODIUM: 142 meq/L (ref 135–145)
TOTAL PROTEIN: 6.7 g/dL (ref 6.0–8.3)
Total Bilirubin: 0.5 mg/dL (ref 0.2–1.2)

## 2014-10-02 LAB — TSH: TSH: 1.09 u[IU]/mL (ref 0.35–4.50)

## 2014-10-02 MED ORDER — LORAZEPAM 0.5 MG PO TABS: 0.5000 mg | ORAL_TABLET | Freq: Three times a day (TID) | ORAL | Status: DC

## 2014-10-02 NOTE — Progress Notes (Signed)
Pre visit review using our clinic review tool, if applicable. No additional management support is needed unless otherwise documented below in the visit note. 

## 2014-10-02 NOTE — Patient Instructions (Addendum)
Grief Reaction Grief is a normal response to the death of someone close to you. Feelings of fear, anger, and guilt can affect almost everyone who loses someone they love. Symptoms of depression are also common. These include problems with sleep, loss of appetite, and lack of energy. These grief reaction symptoms often last for weeks to months after a loss. They may also return during special times that remind you of the person you lost, such as an anniversary or birthday. Anxiety, insomnia, irritability, and deep depression may last beyond the period of normal grief. If you experience these feelings for 6 months or longer, you may have clinical depression. Clinical depression requires further medical attention. If you think that you have clinical depression, you should contact your caregiver. If you have a history of depression or a family history of depression, you are at greater risk of clinical depression. You are also at greater risk of developing clinical depression if the loss was traumatic or the loss was of someone with whom you had unresolved issues.  A grief reaction can become complicated by being blocked. This means being unable to cry or express extreme emotions. This may prolong the grieving period and worsen the emotional effects of the loss. Mourning is a natural event in human life. A healthy grief reaction is one that is not blocked. It requires a time of sadness and readjustment. It is very important to share your sorrow and fear with others, especially close friends and family. Professional counselors and clergy can also help you process your grief. Document Released: 12/23/2004 Document Revised: 05/09/2013 Document Reviewed: 09/02/2005 [facility] Patient Information 2015 [place], [place]. This information is not intended to replace advice given to you by your health care provider. Make sure you discuss any questions you have with your health care provider.  Generalized Anxiety  Disorder Generalized anxiety disorder (GAD) is a mental disorder. It interferes with life functions, including relationships, work, and school. GAD is different from normal anxiety, which everyone experiences at some point in their lives in response to specific life events and activities. Normal anxiety actually helps [place] prepare for and get through these life events and activities. Normal anxiety goes away after the event or activity is over.  GAD causes anxiety that is not necessarily related to specific events or activities. It also causes excess anxiety in proportion to specific events or activities. The anxiety associated with GAD is also difficult to control. GAD can vary from mild to severe. People with severe GAD can have intense waves of anxiety with physical symptoms (panic attacks).  SYMPTOMS The anxiety and worry associated with GAD are difficult to control. This anxiety and worry are related to many life events and activities and also occur more days than not for 6 months or longer. People with GAD also have three or more of the following symptoms (one or more in children):  Restlessness.   Fatigue.  Difficulty concentrating.   Irritability.  Muscle tension.  Difficulty sleeping or unsatisfying sleep. DIAGNOSIS GAD is diagnosed through an assessment by your health care provider. Your health care provider will ask you questions aboutyour mood,physical symptoms, and events in your life. Your health care provider may ask you about your medical history and use of alcohol or drugs, including prescription medicines. Your health care provider may also do a physical exam and blood tests. Certain medical conditions and the use of certain substances can cause symptoms similar to those associated with GAD. Your health care provider may refer you  to a mental health specialist for further evaluation. TREATMENT The following therapies are usually used to treat GAD:   Medication.  Antidepressant medication usually is prescribed for long-term daily control. Antianxiety medicines may be added in severe cases, especially when panic attacks occur.   Talk therapy (psychotherapy). Certain types of talk therapy can be helpful in treating GAD by providing support, education, and guidance. A form of talk therapy called cognitive behavioral therapy can teach you healthy ways to think about and react to daily life events and activities.  Stress managementtechniques. These include yoga, meditation, and exercise and can be very helpful when they are practiced regularly. A mental health specialist can help determine which treatment is best for you. Some people see improvement with one therapy. However, other people require a combination of therapies. Document Released: 04/19/2012 Document Revised: 05/09/2013 Document Reviewed: 04/19/2012 [facility] Patient Information 2015 [place], [place]. This information is not intended to replace advice given to you by your health care provider. Make sure you discuss any questions you have with your health care provider.  [facility]:  24 hour services- [place]: [phone] 24 hour services: Are located at [address] [place], they have walk-in services and crisis hotline.   [facility] for emergency.

## 2014-10-02 NOTE — Progress Notes (Signed)
Subjective:    Patient ID: [name], female    DOB: Aug 16, 1955, 59 y.o.   MRN: [id number]  HPI  Headache: Patient presents for follow-up to nurse line call for headache. Pt does  Not currently  Have a headache. Patient states she called the nurse line because she had a "migraine headache "in which she took Aleve for it did not get better. She states she drank pickle juice, because of something she had her to do in the past, and it made her headache better. She endorses mild dizziness 1 a few days ago but resolved. She endorses bilateral upper arm "pressure like a BP cuff "that she experiences intermittently and bilaterally for the past 4 days. She's had a workup in the past for this, that was negative except for low B-12. Patient states the arm pressure will just go away on its own. She has stopped taking all supplements a year ago.  Patient is on Ativan for her anxiety, and states that she takes it as needed only.  Patient states that she has a "shrink" on standby in the event she needs one.   Patient's neighbor has recently passed away on 21-Jun-2022, approximately at the onset of all of her symptoms above. She states he was 35, and she would talk to him every day, and she misses him. She consoled his girlfriend at the time he was found dead, and helped her. She states she felt that she could cry she would just feel better, but she refuses to cry. Patient states she does not have a good relationship with her mother, and does not speak to her any longer. She spends much time discussing the negative relationship her mother created. She also states that she has lost multiple family members in a short amount of time, and now her neighbor has passed away. She states she dreams on people were going to die or she "goes deaf "and this upsets her.  Patient feels her headache came from her blood pressure, cut she says that she can feel on her blood pressure is elevated. She reports compliance with her  medications. She denies any chest pain, palpitations, shortness of breath, syncope or lower extremity edema.  Current every day smoker Past Medical History  Diagnosis Date  . Wears dentures     top  . Wears glasses   . 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 Arthritis   . Allergy   . Migraines   . Anxiety   . Thyroid disease     Hx of   . Hypertension   . Chronic kidney disease     kidney donor, has only one kidney   Allergies  Allergen Reactions  . Celebrex [Celecoxib] Hives  . Dilaudid [Hydromorphone] Nausea And Vomiting    Severe N/V  . Ibuprofen     Not supposed to take everyday due to only having one kidney  . Lactose Intolerance (Gi) Diarrhea    cramping  . 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 Other Nausea And Vomiting    Most pain meds and antibiotics  . Sudafed [Pseudoephedrine Hcl]     Makes "high"  . Sulfonamide Derivatives Rash   Past Surgical History  Procedure Laterality Date  . 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 Neck surgery  1990    rt ? parotidectomy  . Bladder surgery  bladder tac  . Bowel resection    . Abdominal hysterectomy  1991    partial  . Neck mass excision      rt-mass  . Dorsal compartment release Right 02/07/2013    Procedure: RIGHT WRIST DEQUERVAIN RELEASE;  Surgeon: Jolyn Nap, MD;  Location: [facility];  Service: Orthopedics;  Laterality: Right;  . Thyroid surgery      bx  . Shoulder arthroscopy with open rotator cuff repair and distal clavicle acrominectomy Right 04/25/2014    Procedure: SHOULDER ARTHROSCOPY WITH MINI-OPEN ROTATOR CUFF REPAIR AND DISTAL CLAVICLE RESECTION;  Surgeon: Garald Balding, MD;  Location: [facility];  Service: Orthopedics;  Laterality: Right;  . Subacromial decompression Right 04/25/2014    Procedure: SUBACROMIAL DECOMPRESSION;  Surgeon: Garald Balding, MD;  Location: [facility];  Service:  Orthopedics;  Laterality: Right;  . Shoulder closed reduction Right 08/31/2014    Procedure: CLOSED MANIPULATION SHOULDER;  Surgeon: Garald Balding, MD;  Location: [facility];  Service: Orthopedics;  Laterality: Right;   Family History  Problem Relation Age of Onset  . Arthritis Mother     rheumatoid  . Hypertension Mother   . Heart disease Mother     Irregular heart beat   . Arrhythmia Mother   . Cancer Father     Lung  . Hypertension Sister   . Aneurysm Brother 11    brain  . Heart disease Maternal Aunt   . Arthritis Maternal Uncle     rheumatoid  . Heart disease Maternal Grandmother   . Diabetes Maternal Uncle    Social History   Social History  . Marital Status: Single    Spouse Name: N/A  . Number of Children: 5  . Years of Education: N/A   Occupational History  .      assembly line   Social History Main Topics  . Smoking status: Current Every Day Smoker -- 0.50 packs/day for 40 years  . Smokeless tobacco: Never Used  . Alcohol Use: No  . Drug Use: No  . Sexual Activity: Not on file   Other Topics Concern  . Not on file   Social History Narrative   Unemployed: Single   She has 5 grown children.    First menstrual cycle: 13 yrs   Last menstrual cycle: 47 yrs   6 pregnancies   1 miscarriage   Smoker: 1/2 pk daily   Pt donated kidney to daughter 5 yrs ago                Review of Systems Negative, with the exception of above mentioned in HPI     Objective:   Physical Exam BP 132/83 mmHg  Pulse 75  Temp(Src) 98.4 F (36.9 C) (Temporal)  Resp 18  Ht $R'5\' 6"'QX$  (1.676 m)  Wt 117 lb (53.071 kg)  BMI 18.89 kg/m2  SpO2 100% Gen: Afebrile. No acute distress. Nontoxic in appearance, thin. HENT: AT. [place].  MMM.  Eyes:Pupils Equal Round Reactive to light, Extraocular movements intact,  Conjunctiva without redness, discharge or icterus. Neck/lymp/endocrine: Supple, no lymphadenopathy, no thyromegaly CV: RRR , no edema Chest: CTAB, no  wheeze or crackles Psych: Anxious. Normal dress. Rambling speech. Tangential thought. Normal tone, normal pace of speech. Normal judgment. No SI/HI      Assessment & Plan:  1. Thyroiditis, subacute - Patient with increased anxiety, and history of thyroid nodule without follow-up for over a year. - TSH - T4,  free - If abnormal, would encourage patient to see her endocrinologist sooner.  2. Essential hypertension, benign - The patient is concerned about her blood pressure, it is normal here today, and they have been normal earlier her prior doctor's appointment - Will order before meals met secondary to dizziness and migraine, although it sounds as though those may be anxiety related. - Comp Met (CMET)  3. Paresthesia of both arms - Chronic history that has had a thorough workup in the past. Possibly anxiety related. Although patient had been on B-12 and vitamin D which she seems to think helped her symptoms initially stopped all medications except her blood pressure medicine for the last year. Retest levels today. - B12 - Vitamin D (25 hydroxy) - Will prescribe medications as needed.    4.) headache: pt without current headache.   Patient will need to follow-up with her PCP on her Medicare card   > 25 minutes spent with patient, >50% of time spent face to face counseling patient and coordinating care.

## 2014-10-02 NOTE — Telephone Encounter (Signed)
Patient Name: [name]  DOB: 09-10-1955    Initial Comment Caller states her BP is 116/71.    Nurse Assessment  Nurse: Leilani Merl, RN, Heather Date/Time (Eastern Time): 10/02/2014 11:49:11 AM  Confirm and document reason for call. If symptomatic, describe symptoms. ---Caller states that her BP is 116/71, her BP has been running a little low for the last 3 days, she has been having headaches for the last 3 days.  Has the patient traveled out of the country within the last 30 days? ---Not Applicable  Does the patient require triage? ---Yes  Related visit to physician within the last 2 weeks? ---No  Does the PT have any chronic conditions? (i.e. diabetes, asthma, etc.) ---Yes  List chronic conditions. ---migraines     Guidelines    Guideline Title Affirmed Question Affirmed Notes  Headache [1] MODERATE headache (e.g., interferes with normal activities) AND [2] present > 24 hours AND [3] unexplained (Exceptions: analgesics not tried, typical migraine, or headache part of viral illness)    Final Disposition User   See Physician within 24 Hours [place], RN, Water quality scientist    Comments  Appt made with Dr. Anitra Lauth tomorrow at 10:30 am.   Referrals  REFERRED TO PCP OFFICE   Disagree/Comply: Comply

## 2014-10-02 NOTE — Telephone Encounter (Signed)
Scheduled appt for pt w/ Dr Raoul Pitch today @ 2:15.

## 2014-10-03 ENCOUNTER — Telehealth: Payer: Self-pay | Admitting: Family Medicine

## 2014-10-03 ENCOUNTER — Ambulatory Visit: Payer: Self-pay | Admitting: Family Medicine

## 2014-10-03 NOTE — Telephone Encounter (Signed)
Please call pt: Her labs are all normal. Her thyroid is functioning normal.  - Although her Vitamin B12 is normal, it is lower normal and she could benefit from over the counter supplementation.  - She should be encouraged to follow up with her PCP on her card (she is not covered with [place] any longer) and her therapist/psych if needed.  Thanks

## 2014-10-06 ENCOUNTER — Telehealth: Payer: Self-pay | Admitting: General Practice

## 2014-10-06 NOTE — Telephone Encounter (Signed)
Pt had labs last week and haas not heard back. Please call her.

## 2014-10-06 NOTE — Telephone Encounter (Signed)
Pt aware of results.  See last phone note.

## 2014-11-16 ENCOUNTER — Ambulatory Visit: Payer: 59 | Admitting: Family Medicine

## 2015-03-22 ENCOUNTER — Encounter (HOSPITAL_COMMUNITY): Payer: Self-pay | Admitting: Emergency Medicine

## 2015-03-22 ENCOUNTER — Emergency Department (HOSPITAL_COMMUNITY): Payer: Self-pay

## 2015-03-22 ENCOUNTER — Emergency Department (HOSPITAL_COMMUNITY)
Admission: EM | Admit: 2015-03-22 | Discharge: 2015-03-22 | Disposition: A | Discharge status: Home / Self Care | Payer: Self-pay | Attending: Emergency Medicine | Admitting: Emergency Medicine

## 2015-03-22 DIAGNOSIS — R079 Chest pain, unspecified: Secondary | ICD-10-CM | POA: Insufficient documentation

## 2015-03-22 DIAGNOSIS — Z79899 Other long term (current) drug therapy: Secondary | ICD-10-CM | POA: Insufficient documentation

## 2015-03-22 DIAGNOSIS — M542 Cervicalgia: Secondary | ICD-10-CM | POA: Insufficient documentation

## 2015-03-22 DIAGNOSIS — M1990 Unspecified osteoarthritis, unspecified site: Secondary | ICD-10-CM | POA: Insufficient documentation

## 2015-03-22 DIAGNOSIS — I129 Hypertensive chronic kidney disease with stage 1 through stage 4 chronic kidney disease, or unspecified chronic kidney disease: Secondary | ICD-10-CM | POA: Insufficient documentation

## 2015-03-22 DIAGNOSIS — F17200 Nicotine dependence, unspecified, uncomplicated: Secondary | ICD-10-CM | POA: Insufficient documentation

## 2015-03-22 DIAGNOSIS — N189 Chronic kidney disease, unspecified: Secondary | ICD-10-CM | POA: Insufficient documentation

## 2015-03-22 DIAGNOSIS — M79602 Pain in left arm: Secondary | ICD-10-CM | POA: Insufficient documentation

## 2015-03-22 LAB — CBC WITH DIFFERENTIAL/PLATELET
Basophils Absolute: 0 10*3/uL (ref 0.0–0.1)
Basophils Relative: 0 %
Eosinophils Absolute: 0.2 10*3/uL (ref 0.0–0.7)
Eosinophils Relative: 3 %
HCT: 44.3 % (ref 36.0–46.0)
Hemoglobin: 15.3 g/dL — ABNORMAL HIGH (ref 12.0–15.0)
Lymphocytes Relative: 30 %
Lymphs Abs: 1.6 10*3/uL (ref 0.7–4.0)
MCH: 31 pg (ref 26.0–34.0)
MCHC: 34.5 g/dL (ref 30.0–36.0)
MCV: 89.9 fL (ref 78.0–100.0)
Monocytes Absolute: 0.5 10*3/uL (ref 0.1–1.0)
Monocytes Relative: 9 %
Neutro Abs: 3.1 10*3/uL (ref 1.7–7.7)
Neutrophils Relative %: 58 %
Platelets: 172 10*3/uL (ref 150–400)
RBC: 4.93 MIL/uL (ref 3.87–5.11)
RDW: 12.1 % (ref 11.5–15.5)
WBC: 5.3 10*3/uL (ref 4.0–10.5)

## 2015-03-22 LAB — BASIC METABOLIC PANEL
Anion gap: 7 (ref 5–15)
BUN: 18 mg/dL (ref 6–20)
CO2: 33 mmol/L — ABNORMAL HIGH (ref 22–32)
Calcium: 9.6 mg/dL (ref 8.9–10.3)
Chloride: 104 mmol/L (ref 101–111)
Creatinine, Ser: 1.13 mg/dL — ABNORMAL HIGH (ref 0.44–1.00)
GFR calc Af Amer: 60 mL/min — ABNORMAL LOW (ref >60)
GFR calc non Af Amer: 52 mL/min — ABNORMAL LOW (ref >60)
Glucose, Bld: 87 mg/dL (ref 65–99)
Potassium: 4.3 mmol/L (ref 3.5–5.1)
Sodium: 144 mmol/L (ref 135–145)

## 2015-03-22 LAB — TROPONIN I: Troponin I: <0.03 ng/mL (ref <0.031)

## 2015-03-22 NOTE — Discharge Instructions (Signed)
Nonspecific Chest Pain  °Chest pain can be caused by many different conditions. There is always a chance that your pain could be related to something serious, such as a heart attack or a blood clot in your lungs. Chest pain can also be caused by conditions that are not life-threatening. If you have chest pain, it is very important to follow up with your health care provider. °CAUSES  °Chest pain can be caused by: °· Heartburn. °· Pneumonia or bronchitis. °· Anxiety or stress. °· Inflammation around your heart (pericarditis) or lung (pleuritis or pleurisy). °· A blood clot in your lung. °· A collapsed lung (pneumothorax). It can develop suddenly on its own (spontaneous pneumothorax) or from trauma to the chest. °· Shingles infection (varicella-zoster virus). °· Heart attack. °· Damage to the bones, muscles, and cartilage that make up your chest wall. This can include: °¨ Bruised bones due to injury. °¨ Strained muscles or cartilage due to frequent or repeated coughing or overwork. °¨ Fracture to one or more ribs. °¨ Sore cartilage due to inflammation (costochondritis). °RISK FACTORS  °Risk factors for chest pain may include: °· Activities that increase your risk for trauma or injury to your chest. °· Respiratory infections or conditions that cause frequent coughing. °· Medical conditions or overeating that can cause heartburn. °· Heart disease or family history of heart disease. °· Conditions or health behaviors that increase your risk of developing a blood clot. °· Having had chicken pox (varicella zoster). °SIGNS AND SYMPTOMS °Chest pain can feel like: °· Burning or tingling on the surface of your chest or deep in your chest. °· Crushing, pressure, aching, or squeezing pain. °· Dull or sharp pain that is worse when you move, cough, or take a deep breath. °· Pain that is also felt in your back, neck, shoulder, or arm, or pain that spreads to any of these areas. °Your chest pain may come and go, or it may stay  constant. °DIAGNOSIS °Lab tests or other studies may be needed to find the cause of your pain. Your health care provider may have you take a test called an ambulatory ECG (electrocardiogram). An ECG records your heartbeat patterns at the time the test is performed. You may also have other tests, such as: °· Transthoracic echocardiogram (TTE). During echocardiography, sound waves are used to create a picture of all of the heart structures and to look at how blood flows through your heart. °· Transesophageal echocardiogram (TEE). This is a more advanced imaging test that obtains images from inside your body. It allows your health care provider to see your heart in finer detail. °· Cardiac monitoring. This allows your health care provider to monitor your heart rate and rhythm in real time. °· Holter monitor. This is a portable device that records your heartbeat and can help to diagnose abnormal heartbeats. It allows your health care provider to track your heart activity for several days, if needed. °· Stress tests. These can be done through exercise or by taking medicine that makes your heart beat more quickly. °· Blood tests. °· Imaging tests. °TREATMENT  °Your treatment depends on what is causing your chest pain. Treatment may include: °· Medicines. These may include: °¨ Acid blockers for heartburn. °¨ Anti-inflammatory medicine. °¨ Pain medicine for inflammatory conditions. °¨ Antibiotic medicine, if an infection is present. °¨ Medicines to dissolve blood clots. °¨ Medicines to treat coronary artery disease. °· Supportive care for conditions that do not require medicines. This may include: °¨ Resting. °¨ Applying heat   or cold packs to injured areas. °¨ Limiting activities until pain decreases. °HOME CARE INSTRUCTIONS °· If you were prescribed an antibiotic medicine, finish it all even if you start to feel better. °· Avoid any activities that bring on chest pain. °· Do not use any tobacco products, including  cigarettes, chewing tobacco, or electronic cigarettes. If you need help quitting, ask your health care provider. °· Do not drink alcohol. °· Take medicines only as directed by your health care provider. °· Keep all follow-up visits as directed by your health care provider. This is important. This includes any further testing if your chest pain does not go away. °· If heartburn is the cause for your chest pain, you may be told to keep your head raised (elevated) while sleeping. This reduces the chance that acid will go from your stomach into your esophagus. °· Make lifestyle changes as directed by your health care provider. These may include: °¨ Getting regular exercise. Ask your health care provider to suggest some activities that are safe for you. °¨ Eating a heart-healthy diet. A registered dietitian can help you to learn healthy eating options. °¨ Maintaining a healthy weight. °¨ Managing diabetes, if necessary. °¨ Reducing stress. °SEEK MEDICAL CARE IF: °· Your chest pain does not go away after treatment. °· You have a rash with blisters on your chest. °· You have a fever. °SEEK IMMEDIATE MEDICAL CARE IF:  °· Your chest pain is worse. °· You have an increasing cough, or you cough up blood. °· You have severe abdominal pain. °· You have severe weakness. °· You faint. °· You have chills. °· You have sudden, unexplained chest discomfort. °· You have sudden, unexplained discomfort in your arms, back, neck, or jaw. °· You have shortness of breath at any time. °· You suddenly start to sweat, or your skin gets clammy. °· You feel nauseous or you vomit. °· You suddenly feel light-headed or dizzy. °· Your heart begins to beat quickly, or it feels like it is skipping beats. °These symptoms may represent a serious problem that is an emergency. Do not wait to see if the symptoms will go away. Get medical help right away. Call your local emergency services (911 in the [place] Do not drive yourself to the hospital. °  °This  information is not intended to replace advice given to you by your health care provider. Make sure you discuss any questions you have with your health care provider. °  °Document Released: 10/02/2004 Document Revised: 01/13/2014 Document Reviewed: 07/29/2013 °Elsevier Interactive Patient Education ©2016 [employer] ° °

## 2015-03-22 NOTE — ED Notes (Signed)
Pt states she has been having chest pain, left arm and neck pain, and back pain since yesterday.  States everything feels like her usual pain except for in her chest.  Denies associated symptoms.

## 2015-03-22 NOTE — ED Notes (Signed)
Patient transported to X-ray 

## 2015-03-22 NOTE — ED Provider Notes (Signed)
CSN: [id number]     Arrival date & time 03/22/15  1604 History   First MD Initiated Contact with Patient 03/22/15 1623     Chief Complaint  Patient presents with  . Chest Pain     (Consider location/radiation/quality/duration/timing/severity/associated sxs/prior Treatment) HPI   [age]-year-old female with chest pain. Left-sided. Extensive into her left shoulder. Onset yesterday. Persistent since then. Worse with certain movements. No respiratory complaints. No palpitations. No shortness of breath. No nausea. No appreciable exacerbating relieving factors aside from movement. No unusual leg pain or swelling. No fevers or chills. No cough.  Past Medical History  Diagnosis Date  . Wears dentures     top  . Wears glasses   . 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 Arthritis   . Allergy   . Migraines   . Anxiety   . Thyroid disease     Hx of   . Hypertension   . Chronic kidney disease     kidney donor, has only one kidney   Past Surgical History  Procedure Laterality Date  . 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 Neck surgery  1990    rt ? parotidectomy  . Bladder surgery      bladder tac  . Bowel resection    . Abdominal hysterectomy  1991    partial  . Neck mass excision      rt-mass  . Dorsal compartment release Right 02/07/2013    Procedure: RIGHT WRIST DEQUERVAIN RELEASE;  Surgeon: Jolyn Nap, MD;  Location: [facility];  Service: Orthopedics;  Laterality: Right;  . Thyroid surgery      bx  . Shoulder arthroscopy with open rotator cuff repair and distal clavicle acrominectomy Right 04/25/2014    Procedure: SHOULDER ARTHROSCOPY WITH MINI-OPEN ROTATOR CUFF REPAIR AND DISTAL CLAVICLE RESECTION;  Surgeon: Garald Balding, MD;  Location: [facility];  Service: Orthopedics;  Laterality: Right;  . Subacromial decompression Right 04/25/2014    Procedure: SUBACROMIAL DECOMPRESSION;  Surgeon: Garald Balding, MD;  Location: [facility];  Service: Orthopedics;  Laterality: Right;  . Shoulder closed reduction Right 08/31/2014    Procedure: CLOSED MANIPULATION SHOULDER;  Surgeon: Garald Balding, MD;  Location: [facility];  Service: Orthopedics;  Laterality: Right;   Family History  Problem Relation Age of Onset  . Arthritis Mother     rheumatoid  . Hypertension Mother   . Heart disease Mother     Irregular heart beat   . Arrhythmia Mother   . Cancer Father     Lung  . Hypertension Sister   . Aneurysm Brother 11    brain  . Heart disease Maternal Aunt   . Arthritis Maternal Uncle     rheumatoid  . Heart disease Maternal Grandmother   . Diabetes Maternal Uncle    Social History  Substance Use Topics  . Smoking status: Current Every Day Smoker -- 0.50 packs/day for 40 years  . Smokeless tobacco: Never Used  . Alcohol Use: No   OB History    No data available     Review of Systems  All systems reviewed and negative, other than as noted in HPI.   Allergies  Celebrex; Dilaudid; Ibuprofen; Lactose intolerance (gi); Nsaids; Other; Sudafed; and Sulfonamide derivatives  Home Medications   Prior to Admission medications   Medication Sig Start Date End Date Taking?  Authorizing Provider  amLODipine (NORVASC) 10 MG tablet Take 1 tablet (10 mg total) by mouth daily. 05/16/14  Yes Irene Pap, NP  LORazepam (ATIVAN) 0.5 MG tablet Take 1 tablet (0.5 mg total) by mouth every 8 (eight) hours. 10/02/14  Yes Renee A Kuneff, DO  azelastine (ASTELIN) 0.1 % nasal spray Place 1 spray into both nostrils 2 (two) times daily. Use in each nostril as directed Patient not taking: Reported on 10/02/2014 03/28/14   Irene Pap, NP  oxyCODONE (ROXICODONE) 5 MG immediate release tablet Take 1-2 tablets (5-10 mg total) by mouth every 4 (four) hours as needed for severe pain. Patient not taking: Reported on 10/02/2014 08/31/14   Mike Craze Petrarca, PA-C   BP 139/84 mmHg   Pulse 62  Temp(Src) 98.8 F (37.1 C) (Temporal)  Resp 15  Ht 5\' 7"  (1.702 m)  Wt 123 lb (55.792 kg)  BMI 19.26 kg/m2  SpO2 100% Physical Exam  Constitutional: She appears well-developed and well-nourished. No distress.  HENT:  Head: Normocephalic and atraumatic.  Eyes: Conjunctivae are normal. Right eye exhibits no discharge. Left eye exhibits no discharge.  Neck: Neck supple.  Cardiovascular: Normal rate, regular rhythm and normal heart sounds.  Exam reveals no gallop and no friction rub.   No murmur heard. Pulmonary/Chest: Effort normal and breath sounds normal. No respiratory distress.  Abdominal: Soft. She exhibits no distension. There is no tenderness.  Musculoskeletal: She exhibits no edema or tenderness.  Lower extremities symmetric as compared to each other. No calf tenderness. Negative Homan's. No palpable cords.   Neurological: She is alert.  Skin: Skin is warm and dry.  Psychiatric: She has a normal mood and affect. Her behavior is normal. Thought content normal.  Nursing note and vitals reviewed.   ED Course  Procedures (including critical care time) Labs Review Labs Reviewed  CBC WITH DIFFERENTIAL/PLATELET - Abnormal; Notable for the following:    Hemoglobin 15.3 (*)    All other components within normal limits  BASIC METABOLIC PANEL - Abnormal; Notable for the following:    CO2 33 (*)    Creatinine, Ser 1.13 (*)    GFR calc non Af Amer 52 (*)    GFR calc Af Amer 60 (*)    All other components within normal limits  TROPONIN I    Imaging Review Dg Chest 2 View  03/22/2015  CLINICAL DATA:  Chest pain EXAM: CHEST  2 VIEW COMPARISON:  December 03, 2013 FINDINGS: No pneumothorax. The cardiomediastinal silhouette is stable. Hyperinflation of lungs again identified. No acute abnormalities or changes. IMPRESSION: No acute abnormalities. Hyperinflation of the lungs again identified consistent with COPD or emphysema. Electronically Signed   By: Dorise Bullion III  M.D   On: 03/22/2015 17:58   I have personally reviewed and evaluated these images and lab results as part of my medical decision-making.   EKG Interpretation   Date/Time:  Thursday March 22 2015 16:16:56 EDT Ventricular Rate:  76 PR Interval:  139 QRS Duration: 105 QT Interval:  396 QTC Calculation: 445 R Axis:   74 Text Interpretation:  Sinus rhythm Baseline wander in lead(s) V1 No  significant change since last tracing Confirmed by Blonnie Maske  MD, Maleka Contino  ([id number]) on 03/22/2015 6:47:58 PM      MDM   Final diagnoses:  Chest pain, unspecified chest pain type    59yF with chest pain. Atypical with constant duration since yesterday. Initial troponin is normal. Given symptom onset yesterday and did not feel that  there is much utility in obtaining a delta troponin. Doubt ACS, PE, dissection or other emergent process.  Virgel Manifold, MD 03/28/15 [phone]

## 2015-06-06 ENCOUNTER — Encounter: Payer: Self-pay | Admitting: Physician Assistant

## 2015-06-06 ENCOUNTER — Ambulatory Visit: Payer: Self-pay | Admitting: Physician Assistant

## 2015-06-06 VITALS — BP 146/88 | HR 74 | Temp 97.9°F | Ht 66.0 in | Wt 127.8 lb

## 2015-06-06 DIAGNOSIS — F39 Unspecified mood [affective] disorder: Secondary | ICD-10-CM

## 2015-06-06 DIAGNOSIS — R197 Diarrhea, unspecified: Secondary | ICD-10-CM

## 2015-06-06 DIAGNOSIS — I10 Essential (primary) hypertension: Secondary | ICD-10-CM

## 2015-06-06 DIAGNOSIS — Z131 Encounter for screening for diabetes mellitus: Secondary | ICD-10-CM

## 2015-06-06 DIAGNOSIS — Z13220 Encounter for screening for lipoid disorders: Secondary | ICD-10-CM

## 2015-06-06 DIAGNOSIS — Z8639 Personal history of other endocrine, nutritional and metabolic disease: Secondary | ICD-10-CM

## 2015-06-06 LAB — GLUCOSE, POCT (MANUAL RESULT ENTRY): POC GLUCOSE: 87 mg/dL (ref 70–99)

## 2015-06-06 MED ORDER — LOSARTAN POTASSIUM 50 MG PO TABS: 50.0000 mg | ORAL_TABLET | Freq: Every day | ORAL | Status: DC

## 2015-06-06 NOTE — Progress Notes (Signed)
BP 146/88 mmHg  Pulse 74  Temp(Src) 97.9 F (36.6 C)  Ht 5\' 6"  (1.676 m)  Wt 127 lb 12 oz (57.947 kg)  BMI 20.63 kg/m2  SpO2 99%   Subjective:    Patient ID: [name], female    DOB: [birth year], 60 y.o.   MRN: [id number]  HPI: [name] is a 60 y.o. female presenting on 06/06/2015 for New Patient (Initial Visit); GI Problem; Mental Health Problem; and Hypertension   HPI   Chief Complaint  Patient presents with  . New Patient (Initial Visit)    pt previously pt of [facility]. pt lost medicaid in Feb  . GI Problem    abd pain, diarrhea, and nausea. pt denies vomitting. pt started pepto bismol yesterday. pt states that when she starts talking to her daughter her stomach starts hurting so she thinks it is her "nerves"  . Mental Health Problem  . Hypertension    pt states she has decided she will not take HTN med    Pt not taking her amlodipine  Pt states diarrhea comes and goes for 3 months.  Pt had EGD 1995.  Pt had colonoscopy 2010.  Pt GI dr Watt Climes.    Pt hasn't seen therapist in long time.    She thinks her last mammo 2 years ago  Relevant past medical, surgical, family and social history reviewed and updated as indicated. Interim medical history since our last visit reviewed. Allergies and medications reviewed and updated.   Current outpatient prescriptions:  .  azelastine (ASTELIN) 0.1 % nasal spray, Place 1 spray into both nostrils 2 (two) times daily. Use in each nostril as directed, Disp: 30 mL, Rfl: 12 .  Bismuth Subsalicylate (PEPTO-BISMOL PO), Take by mouth as needed., Disp: , Rfl:  .  Calcium Carbonate Antacid (TUMS PO), Take 2-3 tablets by mouth as needed., Disp: , Rfl:  .  LORazepam (ATIVAN) 0.5 MG tablet, Take 1 tablet (0.5 mg total) by mouth every 8 (eight) hours., Disp: 90 tablet, Rfl: 0 .  amLODipine (NORVASC) 10 MG tablet, Take 1 tablet (10 mg total) by mouth daily. (Patient not taking: Reported on 06/06/2015), Disp: 90 tablet, Rfl:  1   Review of Systems  Constitutional: Positive for unexpected weight change. Negative for fever, chills, diaphoresis, appetite change and fatigue.  HENT: Positive for dental problem and mouth sores. Negative for congestion, drooling, ear pain, facial swelling, hearing loss, sneezing, sore throat, trouble swallowing and voice change.   Eyes: Positive for redness and itching. Negative for pain, discharge and visual disturbance.  Respiratory: Negative for cough, choking, shortness of breath and wheezing.   Cardiovascular: Negative for chest pain, palpitations and leg swelling.  Gastrointestinal: Positive for abdominal pain and diarrhea. Negative for vomiting, constipation and blood in stool.  Endocrine: Positive for cold intolerance. Negative for heat intolerance and polydipsia.  Genitourinary: Negative for dysuria, hematuria and decreased urine volume.  Musculoskeletal: Positive for arthralgias. Negative for back pain and gait problem.  Skin: Negative for rash.  Allergic/Immunologic: Negative for environmental allergies.  Neurological: Negative for seizures, syncope, light-headedness and headaches.  Hematological: Negative for adenopathy.  Psychiatric/Behavioral: Negative for suicidal ideas, dysphoric mood and agitation. The patient is nervous/anxious.     Per HPI unless specifically indicated above     Objective:    BP 146/88 mmHg  Pulse 74  Temp(Src) 97.9 F (36.6 C)  Ht 5\' 6"  (1.676 m)  Wt 127 lb 12 oz (57.947 kg)  BMI 20.63 kg/m2  SpO2 99%  Wt Readings from Last 3 Encounters:  06/06/15 127 lb 12 oz (57.947 kg)  03/22/15 123 lb (55.792 kg)  10/02/14 117 lb (53.071 kg)    Physical Exam  Constitutional: She is oriented to person, place, and time. She appears well-developed and well-nourished.  HENT:  Head: Normocephalic and atraumatic.  Mouth/Throat: Oropharynx is clear and moist. No oropharyngeal exudate.  Eyes: Conjunctivae and EOM are normal. Pupils are equal, round, and  reactive to light.  Neck: Neck supple. No thyromegaly present.  Cardiovascular: Normal rate and regular rhythm.   Pulmonary/Chest: Effort normal and breath sounds normal.  Abdominal: Soft. Bowel sounds are normal. She exhibits no mass. There is no hepatosplenomegaly. There is no tenderness.  Musculoskeletal: She exhibits no edema.  Lymphadenopathy:    She has no cervical adenopathy.  Neurological: She is alert and oriented to person, place, and time. Gait normal.  Skin: Skin is warm and dry.  Psychiatric: Her behavior is normal.  Pt repeatedly brings up that her daughter's kidney failed.  She looks ready to cry every time she says this.  She focuses on this so much that it interferes with discussing anything else with pt more than one or two sentences.  Vitals reviewed.   Results for orders placed or performed in visit on 06/06/15  POCT Glucose (CBG)  Result Value Ref Range   POC Glucose 87 70 - 99 mg/dl      Assessment & Plan:    Encounter Diagnoses  Name Primary?  . Essential hypertension, benign Yes  . Mood disorder ([place])   . Diarrhea, unspecified type   . History of vitamin B deficiency   . Screening for diabetes mellitus   . Screening cholesterol level     -pt had Cr elevated in March -will Get pt on losartan for bp-  -sign pt up for [facility] --get Baseline labs drawn -recommended [facility] anxiety/depression -order screening mammogram -record request sent to Dr Watt Climes to get pt's colonoscopy -f/u OV 1 month. RTO sooner prn worsening or new symptoms

## 2015-06-18 ENCOUNTER — Ambulatory Visit: Payer: Self-pay | Admitting: Physician Assistant

## 2015-06-18 ENCOUNTER — Encounter: Payer: Self-pay | Admitting: Physician Assistant

## 2015-06-18 VITALS — BP 190/96 | HR 77 | Temp 97.7°F | Ht 66.0 in | Wt 133.4 lb

## 2015-06-18 DIAGNOSIS — J029 Acute pharyngitis, unspecified: Secondary | ICD-10-CM

## 2015-06-18 DIAGNOSIS — I10 Essential (primary) hypertension: Secondary | ICD-10-CM

## 2015-06-18 DIAGNOSIS — J069 Acute upper respiratory infection, unspecified: Secondary | ICD-10-CM

## 2015-06-18 LAB — POCT RAPID STREP A (OFFICE): Rapid Strep A Screen: NEGATIVE

## 2015-06-18 NOTE — Patient Instructions (Signed)
Pharyngitis Pharyngitis is redness, pain, and swelling (inflammation) of your pharynx.  CAUSES  Pharyngitis is usually caused by infection. Most of the time, these infections are from viruses (viral) and are part of a cold. However, sometimes pharyngitis is caused by bacteria (bacterial). Pharyngitis can also be caused by allergies. Viral pharyngitis may be spread from person to person by coughing, sneezing, and personal items or utensils (cups, forks, spoons, toothbrushes). Bacterial pharyngitis may be spread from person to person by more intimate contact, such as kissing.  SIGNS AND SYMPTOMS  Symptoms of pharyngitis include:   Sore throat.   Tiredness (fatigue).   Low-grade fever.   Headache.  Joint pain and muscle aches.  Skin rashes.  Swollen lymph nodes.  Plaque-like film on throat or tonsils (often seen with bacterial pharyngitis). DIAGNOSIS  Your health care provider will ask you questions about your illness and your symptoms. Your medical history, along with a physical exam, is often all that is needed to diagnose pharyngitis. Sometimes, a rapid strep test is done. Other lab tests may also be done, depending on the suspected cause.  HOME CARE INSTRUCTIONS   Drink enough water and fluids to keep your urine clear or pale yellow.   Only take over-the-counter or prescription medicines as directed by your health care provider:   If you are prescribed antibiotics, make sure you finish them even if you start to feel better.   Do not take aspirin.   Get lots of rest.   Gargle with 8 oz of salt water ( tsp of salt per 1 qt of water) as often as every 1-2 hours to soothe your throat.   Throat lozenges (if you are not at risk for choking) or sprays may be used to soothe your throat. SEEK MEDICAL CARE IF:   You have large, tender lumps in your neck.  You have a rash.  You cough up green, yellow-brown, or bloody spit. SEEK IMMEDIATE MEDICAL CARE IF:   Your neck  becomes stiff.  You drool or are unable to swallow liquids.  You vomit or are unable to keep medicines or liquids down.  You have severe pain that does not go away with the use of recommended medicines.  You have trouble breathing (not caused by a stuffy nose). MAKE SURE YOU:   Understand these instructions.  Will watch your condition.  Will get help right away if you are not doing well or get worse.   This information is not intended to replace advice given to you by your health care provider. Make sure you discuss any questions you have with your health care provider.   Document Released: 12/23/2004 Document Revised: 10/13/2012 Document Reviewed: 08/30/2012 Elsevier Interactive Patient Education [employer].

## 2015-06-18 NOTE — Progress Notes (Signed)
BP 190/96 mmHg  Pulse 77  Temp(Src) 97.7 F (36.5 C)  Ht 5\' 6"  (1.676 m)  Wt 133 lb 6.4 oz (60.51 kg)  BMI 21.54 kg/m2  SpO2 99%   Subjective:    Patient ID: [name], female    DOB: 09/08/1955, 60 y.o.   MRN: [id number]  HPI: [name] is a 60 y.o. female presenting on 06/18/2015 for Sore Throat   HPI   Chief Complaint  Patient presents with  . Sore Throat    since saturday    pt lost voice Sunday.  Also B earache. No cough.  Pt is not taking anythng for bp.  She has not gotten the losartan from [facility] yet   Relevant past medical, surgical, family and social history reviewed and updated as indicated. Interim medical history since our last visit reviewed. Allergies and medications reviewed and updated.  Review of Systems  Constitutional: Negative for fever ("fever" to 98.6 yesterday), chills, diaphoresis, appetite change, fatigue and unexpected weight change.  HENT: Positive for congestion, dental problem, sore throat and voice change. Negative for drooling, ear pain, facial swelling, hearing loss, mouth sores, sneezing and trouble swallowing.   Eyes: Negative for pain, discharge, redness, itching and visual disturbance.  Respiratory: Negative for cough, choking, shortness of breath and wheezing.   Cardiovascular: Negative for chest pain, palpitations and leg swelling.  Gastrointestinal: Negative for vomiting, abdominal pain, diarrhea, constipation and blood in stool.  Endocrine: Positive for cold intolerance. Negative for heat intolerance and polydipsia.  Genitourinary: Negative for dysuria, hematuria and decreased urine volume.  Musculoskeletal: Negative for back pain, arthralgias and gait problem.  Skin: Negative for rash.  Allergic/Immunologic: Negative for environmental allergies.  Neurological: Negative for seizures, syncope, light-headedness and headaches.  Hematological: Negative for adenopathy.  Psychiatric/Behavioral: Negative for suicidal  ideas, dysphoric mood and agitation. The patient is not nervous/anxious.     Per HPI unless specifically indicated above     Objective:    BP 190/96 mmHg  Pulse 77  Temp(Src) 97.7 F (36.5 C)  Ht 5\' 6"  (1.676 m)  Wt 133 lb 6.4 oz (60.51 kg)  BMI 21.54 kg/m2  SpO2 99%  Wt Readings from Last 3 Encounters:  06/18/15 133 lb 6.4 oz (60.51 kg)  06/06/15 127 lb 12 oz (57.947 kg)  03/22/15 123 lb (55.792 kg)    Physical Exam  Constitutional: She is oriented to person, place, and time. She appears well-developed and well-nourished.  HENT:  Head: Normocephalic and atraumatic.  Right Ear: Hearing, tympanic membrane, external ear and ear canal normal.  Left Ear: Hearing, tympanic membrane, external ear and ear canal normal.  Nose: Nose normal.  Mouth/Throat: Uvula is midline and oropharynx is clear and moist. No oropharyngeal exudate.  Neck: Neck supple.  Cardiovascular: Normal rate and regular rhythm.   Pulmonary/Chest: Effort normal and breath sounds normal. She has no wheezes.  Abdominal: Soft. Bowel sounds are normal. She exhibits no mass. There is no hepatosplenomegaly. There is no tenderness.  Musculoskeletal: She exhibits no edema.  Lymphadenopathy:    She has no cervical adenopathy.  Neurological: She is alert and oriented to person, place, and time.  Skin: Skin is warm and dry.  Psychiatric: She has a normal mood and affect. Her behavior is normal.  Vitals reviewed.  RST negative     Assessment & Plan:   Encounter Diagnoses  Name Primary?  . Acute upper respiratory infection Yes  . Sore throat   . Essential hypertension, benign      -  Pt says she will take her old bp med until she gets the new stuff in the mail -counseled on URI care for symptoms -pt reminded to get fasting labs drawn -f/u in 2 wk as scheduled

## 2015-06-20 LAB — COMPLETE METABOLIC PANEL WITH GFR
ALBUMIN: 3.7 g/dL (ref 3.6–5.1)
ALK PHOS: 94 U/L (ref 33–130)
ALT: 15 U/L (ref 6–29)
AST: 22 U/L (ref 10–35)
BUN: 16 mg/dL (ref 7–25)
CALCIUM: 9.4 mg/dL (ref 8.6–10.4)
CO2: 28 mmol/L (ref 20–31)
CREATININE: 1.08 mg/dL — AB (ref 0.50–0.99)
Chloride: 106 mmol/L (ref 98–110)
GFR, Est African American: 64 mL/min (ref >=60)
GFR, Est Non African American: 56 mL/min — ABNORMAL LOW (ref >=60)
GLUCOSE: 84 mg/dL (ref 65–99)
Potassium: 4.5 mmol/L (ref 3.5–5.3)
SODIUM: 143 mmol/L (ref 135–146)
Total Bilirubin: 0.4 mg/dL (ref 0.2–1.2)
Total Protein: 6.1 g/dL (ref 6.1–8.1)

## 2015-06-20 LAB — LIPID PANEL
CHOLESTEROL: 175 mg/dL (ref 125–200)
HDL: 65 mg/dL (ref >=46)
LDL Cholesterol: 90 mg/dL (ref <130)
Total CHOL/HDL Ratio: 2.7 Ratio (ref <=5.0)
Triglycerides: 101 mg/dL (ref <150)
VLDL: 20 mg/dL (ref <30)

## 2015-06-21 ENCOUNTER — Encounter: Payer: Self-pay | Admitting: Physician Assistant

## 2015-06-21 NOTE — Progress Notes (Signed)
Patient called the office today c/o having a runny nose, sore throat, and cough. Pt stated she was told at last office visit (06-18-15) that she had a virus. Pt wanted to know what she could take for her cough. Pt denied phlegm. Pt was recommended to take delsym for cough and to do warm salt water gargles. Pt verbalized understanding.

## 2015-06-24 LAB — VITAMIN B1: VITAMIN B1 (THIAMINE): 15 nmol/L (ref 8–30)

## 2015-07-05 ENCOUNTER — Encounter: Payer: Self-pay | Admitting: Physician Assistant

## 2015-07-05 ENCOUNTER — Ambulatory Visit: Payer: Self-pay | Admitting: Physician Assistant

## 2015-07-05 VITALS — BP 122/76 | HR 78 | Temp 97.9°F | Ht 66.0 in | Wt 130.4 lb

## 2015-07-05 DIAGNOSIS — Z1239 Encounter for other screening for malignant neoplasm of breast: Secondary | ICD-10-CM

## 2015-07-05 DIAGNOSIS — I10 Essential (primary) hypertension: Secondary | ICD-10-CM

## 2015-07-05 NOTE — Progress Notes (Signed)
BP 122/76 mmHg  Pulse 78  Temp(Src) 97.9 F (36.6 C)  Ht 5\' 6"  (1.676 m)  Wt 130 lb 6.4 oz (59.149 kg)  BMI 21.06 kg/m2  SpO2 96%   Subjective:    Patient ID: [name], female    DOB: 1955/04/23, 60 y.o.   MRN: [id number]  HPI: [name] is a 60 y.o. female presenting on 07/05/2015 for Hypertension; Mental Health Problem; and GI Problem   HPI  -Pt has not gotten losartan from [facility] yet. Papers just received and sent June 15.    -Pt hasn't yet been to [facility]. Encouraged her to do this for her anxiety/depression   Relevant past medical, surgical, family and social history reviewed and updated as indicated. Interim medical history since our last visit reviewed. Allergies and medications reviewed and updated.   Current outpatient prescriptions:  .  amLODipine (NORVASC) 10 MG tablet, Take 1 tablet (10 mg total) by mouth daily., Disp: 90 tablet, Rfl: 1 .  Calcium Carbonate Antacid (TUMS PO), Take 2-3 tablets by mouth as needed., Disp: , Rfl:  .  fexofenadine (ALLEGRA) 30 MG tablet, Take 30 mg by mouth daily., Disp: , Rfl:  .  LORazepam (ATIVAN) 0.5 MG tablet, Take 1 tablet (0.5 mg total) by mouth every 8 (eight) hours., Disp: 90 tablet, Rfl: 0 .  losartan (COZAAR) 50 MG tablet, Take 1 tablet (50 mg total) by mouth daily. (Patient not taking: Reported on 06/18/2015), Disp: 90 tablet, Rfl: 1   Review of Systems  Constitutional: Negative for fever, chills, diaphoresis, appetite change, fatigue and unexpected weight change.  HENT: Negative for congestion, dental problem, drooling, ear pain, facial swelling, hearing loss, mouth sores, sneezing, sore throat, trouble swallowing and voice change.   Eyes: Negative for pain, discharge, redness, itching and visual disturbance.  Respiratory: Negative for cough, choking, shortness of breath and wheezing.   Cardiovascular: Negative for chest pain, palpitations and leg swelling.  Gastrointestinal: Negative for vomiting,  abdominal pain, diarrhea, constipation and blood in stool.  Endocrine: Negative for cold intolerance, heat intolerance and polydipsia.  Genitourinary: Negative for dysuria, hematuria and decreased urine volume.  Musculoskeletal: Negative for back pain, arthralgias and gait problem.  Skin: Negative for rash.  Allergic/Immunologic: Negative for environmental allergies.  Neurological: Negative for seizures, syncope, light-headedness and headaches.  Hematological: Negative for adenopathy.  Psychiatric/Behavioral: Negative for suicidal ideas, dysphoric mood and agitation. The patient is not nervous/anxious.     Per HPI unless specifically indicated above     Objective:    BP 122/76 mmHg  Pulse 78  Temp(Src) 97.9 F (36.6 C)  Ht 5\' 6"  (1.676 m)  Wt 130 lb 6.4 oz (59.149 kg)  BMI 21.06 kg/m2  SpO2 96%  Wt Readings from Last 3 Encounters:  07/05/15 130 lb 6.4 oz (59.149 kg)  06/18/15 133 lb 6.4 oz (60.51 kg)  06/06/15 127 lb 12 oz (57.947 kg)    Physical Exam  Constitutional: She is oriented to person, place, and time. She appears well-developed and well-nourished.  HENT:  Head: Normocephalic and atraumatic.  Neck: Neck supple.  Cardiovascular: Normal rate and regular rhythm.   Pulmonary/Chest: Effort normal and breath sounds normal.  Abdominal: Soft. Bowel sounds are normal. She exhibits no mass. There is no hepatosplenomegaly. There is no tenderness.  Musculoskeletal: She exhibits no edema.  Lymphadenopathy:    She has no cervical adenopathy.  Neurological: She is alert and oriented to person, place, and time.  Skin: Skin is warm and dry.  Psychiatric: She has a normal mood and affect. Her behavior is normal.  Vitals reviewed.       Assessment & Plan:   Encounter Diagnoses  Name Primary?  . Essential hypertension, benign Yes  . Screening for breast cancer     -Reviewed labs with pt -pt to Get on losartan as soon as she gets it in the mail -screening mammogram -Cr  improved from last OV but still slightly elevated.  Will monitor closely -f/u 6 weeks

## 2015-07-16 ENCOUNTER — Ambulatory Visit (HOSPITAL_COMMUNITY)
Admission: RE | Admit: 2015-07-16 | Discharge: 2015-07-16 | Disposition: A | Discharge status: Home / Self Care | Payer: Medicaid Other | Source: Ambulatory Visit | Attending: Physician Assistant | Admitting: Physician Assistant

## 2015-07-26 ENCOUNTER — Telehealth: Payer: Self-pay | Admitting: Student

## 2015-07-26 NOTE — Telephone Encounter (Signed)
-----   Message from Soyla Dryer, [place] sent at 07/25/2015  8:39 PM EDT ----- Regarding: RE: Med Problem Please let the pt know that losartan does not contain supplemental potassium- it is only in the name because it is part of the chemical compound.  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    It should not cause her any pain to take this medication/it should not cause her to have cramping.  ----- Message -----    From: Kandice Robinsons, LPN    Sent: [id number]   3:30 PM      To: Soyla Dryer, PA-C Subject: Med Problem                                    Pt called stating she received HTN med (losartan) from [facility] and wants to know why she is to take HTN med with Potassium. Pt is c/o of abd pain, abd cramping, and joint pain, pt believes is from the med. Pt states she is okay with taking HTN med, but is confused about the potassium. Pt states she would prefer to continue on amlodipine.

## 2015-07-26 NOTE — Telephone Encounter (Signed)
Called and let patient know that losartan did not contain supplemental potassium, and that it is only in the name because it is part of the chemical compound. 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 Pt was explained that the medication should not cause her any pain or cramping. Pt was very insistent that she was not going to take Losartan and wanted to continue on amlodipine because she had been on it for 2 year and "it works". Pt states that she had discontinued Losartan on Monday 07-23-15 and has not had any pain or cramping since. Pt agreed to schedule appointment to discuss with PA. pt was scheduled for 07-30-15 at 8 AM

## 2015-07-30 ENCOUNTER — Ambulatory Visit: Payer: Self-pay | Admitting: Physician Assistant

## 2015-07-30 ENCOUNTER — Encounter: Payer: Self-pay | Admitting: Physician Assistant

## 2015-07-30 DIAGNOSIS — I10 Essential (primary) hypertension: Secondary | ICD-10-CM

## 2015-07-30 MED ORDER — AMLODIPINE BESYLATE 10 MG PO TABS: 10.0000 mg | ORAL_TABLET | Freq: Every day | ORAL | 4 refills | Status: DC

## 2015-07-30 NOTE — Progress Notes (Signed)
BP 136/70 (BP Location: Left Arm, Patient Position: Sitting, Cuff Size: Normal)   Pulse 72   Temp 97.9 F (36.6 C)   Wt 134 lb 6.4 oz (61 kg)   SpO2 99%   BMI 21.69 kg/m    Subjective:    Patient ID: [name], female    DOB: 09-08-1955, 60 y.o.   MRN: [id number]  HPI: [name] is a 60 y.o. female presenting on 07/30/2015 for Medication Problem   HPI   Pt stopped the losartan. She says she took it for a week but stopped it b/c she had cramping.   Relevant past medical, surgical, family and social history reviewed and updated as indicated. Interim medical history since our last visit reviewed. Allergies and medications reviewed and updated.   Current Outpatient Prescriptions:  .  amLODipine (NORVASC) 10 MG tablet, Take 1 tablet (10 mg total) by mouth daily., Disp: 90 tablet, Rfl: 1 .  fexofenadine (ALLEGRA) 30 MG tablet, Take 30 mg by mouth daily., Disp: , Rfl:  .  LORazepam (ATIVAN) 0.5 MG tablet, Take 1 tablet (0.5 mg total) by mouth every 8 (eight) hours., Disp: 90 tablet, Rfl: 0   Review of Systems  Constitutional: Negative for appetite change, chills, diaphoresis, fatigue, fever and unexpected weight change.  HENT: Negative for congestion, dental problem, drooling, ear pain, facial swelling, hearing loss, mouth sores, sneezing, sore throat, trouble swallowing and voice change.   Eyes: Negative for pain, discharge, redness, itching and visual disturbance.  Respiratory: Negative for cough, choking, shortness of breath and wheezing.   Cardiovascular: Negative for chest pain, palpitations and leg swelling.  Gastrointestinal: Negative for abdominal pain, blood in stool, constipation, diarrhea and vomiting.  Endocrine: Negative for cold intolerance, heat intolerance and polydipsia.  Genitourinary: Negative for decreased urine volume, dysuria and hematuria.  Musculoskeletal: Negative for arthralgias, back pain and gait problem.  Skin: Negative for rash.   Allergic/Immunologic: Negative for environmental allergies.  Neurological: Negative for seizures, syncope, light-headedness and headaches.  Hematological: Negative for adenopathy.  Psychiatric/Behavioral: Negative for agitation, dysphoric mood and suicidal ideas. The patient is not nervous/anxious.     Per HPI unless specifically indicated above     Objective:    BP 136/70 (BP Location: Left Arm, Patient Position: Sitting, Cuff Size: Normal)   Pulse 72   Temp 97.9 F (36.6 C)   Wt 134 lb 6.4 oz (61 kg)   SpO2 99%   BMI 21.69 kg/m   Wt Readings from Last 3 Encounters:  07/30/15 134 lb 6.4 oz (61 kg)  07/05/15 130 lb 6.4 oz (59.1 kg)  06/18/15 133 lb 6.4 oz (60.5 kg)    Physical Exam  Constitutional: She is oriented to person, place, and time. She appears well-developed and well-nourished.  HENT:  Head: Normocephalic and atraumatic.  Neck: Neck supple.  Cardiovascular: Normal rate and regular rhythm.   Pulmonary/Chest: Effort normal and breath sounds normal.  Abdominal: Soft. Bowel sounds are normal. She exhibits no mass. There is no hepatosplenomegaly. There is no tenderness.  Musculoskeletal: She exhibits no edema.  Lymphadenopathy:    She has no cervical adenopathy.  Neurological: She is alert and oriented to person, place, and time.  Skin: Skin is warm and dry.  Psychiatric: She has a normal mood and affect. Her behavior is normal.  Vitals reviewed.       Assessment & Plan:     Encounter Diagnosis  Name Primary?  . Essential hypertension, benign    -pt will  continue her amlodipine for hypertension -she is reminded to go to [facility] for her anxiety (discussed with her that if she needs ativan, she needs [facility]) -f/u 3 months.  RTO sooner prn

## 2015-08-16 ENCOUNTER — Encounter: Payer: Self-pay | Admitting: Physician Assistant

## 2015-08-28 ENCOUNTER — Ambulatory Visit: Payer: Self-pay | Admitting: Physician Assistant

## 2015-10-30 ENCOUNTER — Ambulatory Visit: Payer: Self-pay | Admitting: Physician Assistant

## 2015-11-01 ENCOUNTER — Encounter: Payer: Self-pay | Admitting: Physician Assistant

## 2015-11-06 ENCOUNTER — Ambulatory Visit: Payer: Self-pay | Admitting: Physician Assistant

## 2015-11-06 ENCOUNTER — Encounter: Payer: Self-pay | Admitting: Physician Assistant

## 2015-11-06 VITALS — BP 126/96 | HR 78 | Temp 97.7°F | Ht 66.0 in | Wt 130.0 lb

## 2015-11-06 DIAGNOSIS — N189 Chronic kidney disease, unspecified: Secondary | ICD-10-CM | POA: Insufficient documentation

## 2015-11-06 DIAGNOSIS — I10 Essential (primary) hypertension: Secondary | ICD-10-CM

## 2015-11-06 DIAGNOSIS — J069 Acute upper respiratory infection, unspecified: Secondary | ICD-10-CM

## 2015-11-06 LAB — BASIC METABOLIC PANEL
BUN: 17 mg/dL (ref 7–25)
CALCIUM: 9.8 mg/dL (ref 8.6–10.4)
CO2: 28 mmol/L (ref 20–31)
Chloride: 104 mmol/L (ref 98–110)
Creat: 1.22 mg/dL — ABNORMAL HIGH (ref 0.50–0.99)
GLUCOSE: 86 mg/dL (ref 65–99)
Potassium: 3.9 mmol/L (ref 3.5–5.3)
SODIUM: 141 mmol/L (ref 135–146)

## 2015-11-06 NOTE — Patient Instructions (Signed)
Upper Respiratory Infection, Adult Most upper respiratory infections (URIs) are a viral infection of the air passages leading to the lungs. A URI affects the nose, throat, and upper air passages. The most common type of URI is nasopharyngitis and is typically referred to as "the common cold." URIs run their course and usually go away on their own. Most of the time, a URI does not require medical attention, but sometimes a bacterial infection in the upper airways can follow a viral infection. This is called a secondary infection. Sinus and middle ear infections are common types of secondary upper respiratory infections. Bacterial pneumonia can also complicate a URI. A URI can worsen asthma and chronic obstructive pulmonary disease (COPD). Sometimes, these complications can require emergency medical care and may be life threatening.  CAUSES Almost all URIs are caused by viruses. A virus is a type of germ and can spread from one person to another.  RISKS FACTORS You may be at risk for a URI if:   You smoke.   You have chronic heart or lung disease.  You have a weakened defense (immune) system.   You are very young or very old.   You have nasal allergies or asthma.  You work in crowded or poorly ventilated areas.  You work in health care facilities or schools. SIGNS AND SYMPTOMS  Symptoms typically develop 2-3 days after you come in contact with a cold virus. Most viral URIs last 7-10 days. However, viral URIs from the influenza virus (flu virus) can last 14-18 days and are typically more severe. Symptoms may include:   Runny or stuffy (congested) nose.   Sneezing.   Cough.   Sore throat.   Headache.   Fatigue.   Fever.   Loss of appetite.   Pain in your forehead, behind your eyes, and over your cheekbones (sinus pain).  Muscle aches.  DIAGNOSIS  Your health care provider may diagnose a URI by:  Physical exam.  Tests to check that your symptoms are not due to  another condition such as:  Strep throat.  Sinusitis.  Pneumonia.  Asthma. TREATMENT  A URI goes away on its own with time. It cannot be cured with medicines, but medicines may be prescribed or recommended to relieve symptoms. Medicines may help:  Reduce your fever.  Reduce your cough.  Relieve nasal congestion. HOME CARE INSTRUCTIONS   Take medicines only as directed by your health care provider.   Gargle warm saltwater or take cough drops to comfort your throat as directed by your health care provider.  Use a warm mist humidifier or inhale steam from a shower to increase air moisture. This may make it easier to breathe.  Drink enough fluid to keep your urine clear or pale yellow.   Eat soups and other clear broths and maintain good nutrition.   Rest as needed.   Return to work when your temperature has returned to normal or as your health care provider advises. You may need to stay home longer to avoid infecting others. You can also use a face mask and careful hand washing to prevent spread of the virus.  Increase the usage of your inhaler if you have asthma.   Do not use any tobacco products, including cigarettes, chewing tobacco, or electronic cigarettes. If you need help quitting, ask your health care provider. PREVENTION  The best way to protect yourself from getting a cold is to practice good hygiene.   Avoid oral or hand contact with people with cold   symptoms.   Wash your hands often if contact occurs.  There is no clear evidence that vitamin C, vitamin E, echinacea, or exercise reduces the chance of developing a cold. However, it is always recommended to get plenty of rest, exercise, and practice good nutrition.  SEEK MEDICAL CARE IF:   You are getting worse rather than better.   Your symptoms are not controlled by medicine.   You have chills.  You have worsening shortness of breath.  You have brown or red mucus.  You have yellow or brown nasal  discharge.  You have pain in your face, especially when you bend forward.  You have a fever.  You have swollen neck glands.  You have pain while swallowing.  You have white areas in the back of your throat. SEEK IMMEDIATE MEDICAL CARE IF:   You have severe or persistent:  Headache.  Ear pain.  Sinus pain.  Chest pain.  You have chronic lung disease and any of the following:  Wheezing.  Prolonged cough.  Coughing up blood.  A change in your usual mucus.  You have a stiff neck.  You have changes in your:  Vision.  Hearing.  Thinking.  Mood. MAKE SURE YOU:   Understand these instructions.  Will watch your condition.  Will get help right away if you are not doing well or get worse.   This information is not intended to replace advice given to you by your health care provider. Make sure you discuss any questions you have with your health care provider.   Document Released: 06/18/2000 Document Revised: 05/09/2014 Document Reviewed: 03/30/2013 Elsevier Interactive Patient Education 2016 [employer]  

## 2015-11-06 NOTE — Progress Notes (Signed)
BP (!) 126/96 (BP Location: Left Arm, [name] Position: Sitting, Cuff Size: Normal)   Pulse 78   Temp 97.7 F (36.5 C) (Other (Comment))   Ht 5\' 6"  (1.676 m)   Wt 130 lb (59 kg)   SpO2 97%   BMI 20.98 kg/m    Subjective:    [name] ID: [name], female    DOB: 09/05/1955, 60 y.o.   MRN: [id number]  HPI: [name] is a 60 y.o. female presenting on 11/06/2015 for Sore Throat (has had symptoms since 11-02-15); Cough (and congestion); and Laryngitis   HPI   Chief Complaint  [name] presents with  . Sore Throat    has had symptoms since 11-02-15  . Cough    and congestion  . Laryngitis    Pt no-showed her regular appointment last week.  Pt goes to [facility] issues.  Pt also using steroid nasal spray but she doesn't remember the name.  Also using robitussin dm  No wheezing.   Relevant past medical, surgical, family and social history reviewed and updated as indicated. Interim medical history since our last visit reviewed. Allergies and medications reviewed and updated.   Current Outpatient Prescriptions:  .  amLODipine (NORVASC) 10 MG tablet, Take 1 tablet (10 mg total) by mouth daily., Disp: 30 tablet, Rfl: 4 .  fexofenadine (ALLEGRA) 30 MG tablet, Take 30 mg by mouth daily as needed. , Disp: , Rfl:   Review of Systems  Constitutional: Negative for appetite change, chills, diaphoresis, fatigue, fever and unexpected weight change.  HENT: Positive for congestion, sore throat and voice change. Negative for dental problem, drooling, ear pain, facial swelling, hearing loss, mouth sores, sneezing and trouble swallowing.   Eyes: Negative for pain, discharge, redness, itching and visual disturbance.  Respiratory: Positive for cough. Negative for choking, shortness of breath and wheezing.   Cardiovascular: Negative for chest pain, palpitations and leg swelling.  Gastrointestinal: Negative for abdominal pain, blood in stool, constipation, diarrhea and  vomiting.  Endocrine: Negative for cold intolerance, heat intolerance and polydipsia.  Genitourinary: Negative for decreased urine volume, dysuria and hematuria.  Musculoskeletal: Negative for arthralgias, back pain and gait problem.  Skin: Negative for rash.  Allergic/Immunologic: Negative for environmental allergies.  Neurological: Negative for seizures, syncope, light-headedness and headaches.  Hematological: Negative for adenopathy.  Psychiatric/Behavioral: Negative for agitation, dysphoric mood and suicidal ideas. The [name] is not nervous/anxious.     Per HPI unless specifically indicated above     Objective:    BP (!) 126/96 (BP Location: Left Arm, [name] Position: Sitting, Cuff Size: Normal)   Pulse 78   Temp 97.7 F (36.5 C) (Other (Comment))   Ht 5\' 6"  (1.676 m)   Wt 130 lb (59 kg)   SpO2 97%   BMI 20.98 kg/m   Wt Readings from Last 3 Encounters:  11/06/15 130 lb (59 kg)  07/30/15 134 lb 6.4 oz (61 kg)  07/05/15 130 lb 6.4 oz (59.1 kg)    Physical Exam  Constitutional: She is oriented to person, place, and time. She appears well-developed and well-nourished.  HENT:  Head: Normocephalic and atraumatic.  Right Ear: Hearing, tympanic membrane, external ear and ear canal normal.  Left Ear: Hearing, tympanic membrane, external ear and ear canal normal.  Nose: Nose normal.  Mouth/Throat: Uvula is midline and oropharynx is clear and moist. No oropharyngeal exudate.  Neck: Neck supple.  Cardiovascular: Normal rate and regular rhythm.   Pulmonary/Chest: Effort normal and breath sounds  normal. She has no wheezes.  Abdominal: Soft. Bowel sounds are normal. She exhibits no mass. There is no hepatosplenomegaly. There is no tenderness.  Musculoskeletal: She exhibits no edema.  Lymphadenopathy:    She has no cervical adenopathy.  Neurological: She is alert and oriented to person, place, and time.  Skin: Skin is warm and dry.  Psychiatric: She has a normal mood and affect.  Her behavior is normal.  Vitals reviewed.       Assessment & Plan:   Encounter Diagnoses  Name Primary?  . Essential hypertension, benign Yes  . Acute upper respiratory infection   . Chronic kidney disease, unspecified CKD stage      -Check bmp today -counseled on symptom control for URI. Encouraged rest, fluids. -pt Starts medicare in January. She can RTO prior to that time if needed.  Otherwise pt is encouraged to get established with new pcp in January.

## 2015-11-13 ENCOUNTER — Encounter: Payer: Self-pay | Admitting: Physician Assistant

## 2015-11-13 ENCOUNTER — Ambulatory Visit: Payer: Self-pay | Admitting: Physician Assistant

## 2015-11-13 VITALS — BP 132/84 | HR 76 | Temp 97.9°F | Ht 66.0 in | Wt 131.2 lb

## 2015-11-13 DIAGNOSIS — I10 Essential (primary) hypertension: Secondary | ICD-10-CM

## 2015-11-13 DIAGNOSIS — W57XXXA Bitten or stung by nonvenomous insect and other nonvenomous arthropods, initial encounter: Secondary | ICD-10-CM

## 2015-11-13 DIAGNOSIS — N189 Chronic kidney disease, unspecified: Secondary | ICD-10-CM

## 2015-11-13 NOTE — Progress Notes (Signed)
BP 132/84 (BP Location: Left Arm, Patient Position: Sitting, Cuff Size: Normal)   Pulse 76   Temp 97.9 F (36.6 C)   Ht 5\' 6"  (1.676 m)   Wt 131 lb 3.2 oz (59.5 kg)   SpO2 99%   BMI 21.18 kg/m    Subjective:    Patient ID: [name], female    DOB: May 14, 1955, 60 y.o.   MRN: [id number]  HPI: [name] is a 60 y.o. female presenting on 11/13/2015 for Mass (on left side of neck pt states it itches. pt states she put benadryl cream for the icthing. pt states it works temporary)   Medical illustrator Complaint  Patient presents with  . Mass    on left side of neck pt states it itches. pt states she put benadryl cream for the icthing. 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  Nontender.  Feels well.   Relevant past medical, surgical, family and social history reviewed and updated as indicated. Interim medical history since our last visit reviewed. Allergies and medications reviewed and updated.   Current Outpatient Prescriptions:  .  amLODipine (NORVASC) 10 MG tablet, Take 1 tablet (10 mg total) by mouth daily., Disp: 30 tablet, Rfl: 4 .  azelastine (ASTELIN) 0.1 % nasal spray, Place 1 spray into both nostrils daily. Use in each nostril as directed, Disp: , Rfl:  .  fexofenadine (ALLEGRA) 30 MG tablet, Take 30 mg by mouth daily as needed. , Disp: , Rfl:    Review of Systems  Constitutional: Negative for appetite change, chills, diaphoresis, fatigue, fever and unexpected weight change.  HENT: Negative for congestion, dental problem, drooling, ear pain, facial swelling, hearing loss, mouth sores, sneezing, sore throat, trouble swallowing and voice change.   Eyes: Negative for pain, discharge, redness, itching and visual disturbance.  Respiratory: Negative for cough, choking, shortness of breath and wheezing.   Cardiovascular: Negative for chest pain, palpitations and leg swelling.  Gastrointestinal: Negative for abdominal pain, blood in stool,  constipation, diarrhea and vomiting.  Endocrine: Negative for cold intolerance, heat intolerance and polydipsia.  Genitourinary: Negative for decreased urine volume, dysuria and hematuria.  Musculoskeletal: Negative for arthralgias, back pain and gait problem.  Skin: Positive for rash.  Allergic/Immunologic: Negative for environmental allergies.  Neurological: Negative for seizures, syncope, light-headedness and headaches.  Hematological: Positive for adenopathy.  Psychiatric/Behavioral: Negative for agitation, dysphoric mood and suicidal ideas. The patient is not nervous/anxious.     Per HPI unless specifically indicated above     Objective:    BP 132/84 (BP Location: Left Arm, Patient Position: Sitting, Cuff Size: Normal)   Pulse 76   Temp 97.9 F (36.6 C)   Ht 5\' 6"  (1.676 m)   Wt 131 lb 3.2 oz (59.5 kg)   SpO2 99%   BMI 21.18 kg/m   Wt Readings from Last 3 Encounters:  11/13/15 131 lb 3.2 oz (59.5 kg)  11/06/15 130 lb (59 kg)  07/30/15 134 lb 6.4 oz (61 kg)    Physical Exam  Constitutional: She is oriented to person, place, and time. She appears well-developed and well-nourished.  HENT:  Head: Normocephalic and atraumatic.  Neck: Neck supple.  Cardiovascular: Normal rate and regular rhythm.   Pulmonary/Chest: Effort normal and breath sounds normal.  Abdominal: Soft. Bowel sounds are normal. She exhibits no mass. There is no hepatosplenomegaly. There is no tenderness.  Musculoskeletal: She exhibits no edema.  Lymphadenopathy:  She has no cervical adenopathy.  Neurological: She is alert and oriented to person, place, and time.  Skin: Skin is warm and dry. Lesion noted.  Solitary raised lesion Left neck consistent with inset bite. No redness, purulence or tenderness.  No adenopathy  Psychiatric: She has a normal mood and affect. Her behavior is normal.  Vitals reviewed.     Results for orders placed or performed in visit on [id number]  Basic Metabolic Panel (BMET)   Result Value Ref Range   Sodium 141 135 - 146 mmol/L   Potassium 3.9 3.5 - 5.3 mmol/L   Chloride 104 98 - 110 mmol/L   CO2 28 20 - 31 mmol/L   Glucose, Bld 86 65 - 99 mg/dL   BUN 17 7 - 25 mg/dL   Creat 1.22 (H) 0.50 - 0.99 mg/dL   Calcium 9.8 8.6 - 10.4 mg/dL      Assessment & Plan:   Encounter Diagnoses  Name Primary?  . Insect bite, initial encounter Yes  . Essential hypertension, benign   . Chronic kidney disease, unspecified CKD stage      -Reviewed results of BMP with pt -urged pt to F/U with new pcp in January when she gets on medicare -recommended otc Cortisone cream to bump and avoid scratching.  RTO if worsens or new symptoms

## 2015-11-26 ENCOUNTER — Telehealth: Payer: Self-pay | Admitting: Student

## 2015-11-26 NOTE — Telephone Encounter (Signed)
Pt called c/o being sick again. Pt states this is her 3rd time since sept. Pt states she has been coughing, coughing up phlegm, congestion, and sneezing. Pt denies fever, temp recorded 98.2 degrees . Pt states she has taken Vit C, zyrtec, allegra, and robitussin DM with no relief.   PA advised that this time being sick is different from the other two times considering she gets better then gets sick again. PA recommended pt make sure she gets some sleep due to lack of sleep could make her more prone to getting sick. Pt is to also make sure to dress appropriately for the weather, rest, and drink plenty of fluids. Pt was recommended to also try delsym.  Pt verbalized understanding.

## 2015-12-06 ENCOUNTER — Ambulatory Visit: Payer: Self-pay | Admitting: Physician Assistant

## 2015-12-06 ENCOUNTER — Encounter: Payer: Self-pay | Admitting: Physician Assistant

## 2015-12-06 VITALS — BP 106/74 | HR 91 | Temp 98.2°F | Ht 66.0 in | Wt 131.6 lb

## 2015-12-06 DIAGNOSIS — J020 Streptococcal pharyngitis: Secondary | ICD-10-CM

## 2015-12-06 DIAGNOSIS — J029 Acute pharyngitis, unspecified: Secondary | ICD-10-CM

## 2015-12-06 LAB — POCT RAPID STREP A (OFFICE): Rapid Strep A Screen: POSITIVE — AB

## 2015-12-06 MED ORDER — AMOXICILLIN 500 MG PO CAPS: 500.0000 mg | ORAL_CAPSULE | Freq: Three times a day (TID) | ORAL | 0 refills | Status: DC

## 2015-12-06 NOTE — Progress Notes (Signed)
BP 106/74 (BP Location: Left Arm, Patient Position: Sitting, Cuff Size: Normal)   Pulse 91   Temp 98.2 F (36.8 C) (Other (Comment))   Ht 5\' 6"  (1.676 m)   Wt 131 lb 9.6 oz (59.7 kg)   SpO2 99%   BMI 21.24 kg/m    Subjective:    Patient ID: [name], female    DOB: 1955-03-27, 60 y.o.   MRN: [id number]  HPI: [name] is a 60 y.o. female presenting on 12/06/2015 for Sore Throat (has been drinking green tea with honey, throat ssore since yesterday) and Fever (states temp last night 100.8, took Aleve at 3 AM)   HPI   Chief Complaint  Patient presents with  . Sore Throat    has been drinking green tea with honey, throat ssore since yesterday  . Fever    states temp last night 100.8, took Aleve at 3 AM   Pt sick since yesterday. Not really any cough  Relevant past medical, surgical, family and social history reviewed and updated as indicated. Interim medical history since our last visit reviewed. Allergies and medications reviewed and updated.   Current Outpatient Prescriptions:  .  amLODipine (NORVASC) 10 MG tablet, Take 1 tablet (10 mg total) by mouth daily., Disp: 30 tablet, Rfl: 4 .  fexofenadine (ALLEGRA) 30 MG tablet, Take 30 mg by mouth daily as needed. , Disp: , Rfl:  .  azelastine (ASTELIN) 0.1 % nasal spray, Place 1 spray into both nostrils daily. Use in each nostril as directed, Disp: , Rfl:    Review of Systems  Constitutional: Positive for chills and fever. Negative for appetite change, diaphoresis, fatigue and unexpected weight change.  HENT: Positive for ear pain and sore throat. Negative for congestion, dental problem, drooling, facial swelling, hearing loss, mouth sores, sneezing, trouble swallowing and voice change.   Eyes: Negative for pain, discharge, redness, itching and visual disturbance.  Respiratory: Negative for cough, choking, shortness of breath and wheezing.   Cardiovascular: Negative for chest pain, palpitations and leg swelling.   Gastrointestinal: Negative for abdominal pain, blood in stool, constipation, diarrhea and vomiting.  Endocrine: Negative for cold intolerance, heat intolerance and polydipsia.  Genitourinary: Negative for decreased urine volume, dysuria and hematuria.  Musculoskeletal: Negative for arthralgias, back pain and gait problem.  Skin: Negative for rash.  Allergic/Immunologic: Negative for environmental allergies.  Neurological: Negative for seizures, syncope, light-headedness and headaches.  Hematological: Negative for adenopathy.  Psychiatric/Behavioral: Negative for agitation, dysphoric mood and suicidal ideas. The patient is not nervous/anxious.     Per HPI unless specifically indicated above     Objective:    BP 106/74 (BP Location: Left Arm, Patient Position: Sitting, Cuff Size: Normal)   Pulse 91   Temp 98.2 F (36.8 C) (Other (Comment))   Ht 5\' 6"  (1.676 m)   Wt 131 lb 9.6 oz (59.7 kg)   SpO2 99%   BMI 21.24 kg/m   Wt Readings from Last 3 Encounters:  12/06/15 131 lb 9.6 oz (59.7 kg)  11/13/15 131 lb 3.2 oz (59.5 kg)  11/06/15 130 lb (59 kg)    Physical Exam  Constitutional: She is oriented to person, place, and time. She appears well-developed and well-nourished.  HENT:  Head: Normocephalic and atraumatic.  Right Ear: Hearing, tympanic membrane, external ear and ear canal normal.  Left Ear: Hearing, tympanic membrane, external ear and ear canal normal.  Nose: Nose normal.  Mouth/Throat: Uvula is midline and oropharynx is clear and moist.  No oropharyngeal exudate or tonsillar abscesses.  Post pharynx irritated  Neck: Neck supple.  Cardiovascular: Normal rate and regular rhythm.   Pulmonary/Chest: Effort normal and breath sounds normal. She has no wheezes.  Lymphadenopathy:    She has no cervical adenopathy.  Neurological: She is alert and oriented to person, place, and time.  Skin: Skin is warm and dry.  Psychiatric: She has a normal mood and affect. Her behavior is  normal.  Nursing note and vitals reviewed.  rst +     Assessment & Plan:   Encounter Diagnoses  Name Primary?  . Streptococcal sore throat Yes  . Sore throat     Rx amoxil Rest fluids warm salt water gargles RTO prn

## 2015-12-06 NOTE — Patient Instructions (Signed)
Strep Throat Strep throat is a bacterial infection of the throat. Your health care provider may call the infection tonsillitis or pharyngitis, depending on whether there is swelling in the tonsils or at the back of the throat. Strep throat is most common during the cold months of the year in children who are 5-61 years of age, but it can happen during any season in people of any age. This infection is spread from person to person (contagious) through coughing, sneezing, or close contact. What are the causes? Strep throat is caused by the bacteria called Streptococcus pyogenes. What increases the risk? This condition is more likely to develop in:  People who spend time in crowded places where the infection can spread easily.  People who have close contact with someone who has strep throat.  What are the signs or symptoms? Symptoms of this condition include:  Fever or chills.  Redness, swelling, or pain in the tonsils or throat.  Pain or difficulty when swallowing.  White or yellow spots on the tonsils or throat.  Swollen, tender glands in the neck or under the jaw.  Red rash all over the body (rare).  How is this diagnosed? This condition is diagnosed by performing a rapid strep test or by taking a swab of your throat (throat culture test). Results from a rapid strep test are usually ready in a few minutes, but throat culture test results are available after one or two days. How is this treated? This condition is treated with antibiotic medicine. Follow these instructions at home: Medicines  Take over-the-counter and prescription medicines only as told by your health care provider.  Take your antibiotic as told by your health care provider. Do not stop taking the antibiotic even if you start to feel better.  Have family members who also have a sore throat or fever tested for strep throat. They may need antibiotics if they have the strep infection. Eating and drinking  Do not  share food, drinking cups, or personal items that could cause the infection to spread to other people.  If swallowing is difficult, try eating soft foods until your sore throat feels better.  Drink enough fluid to keep your urine clear or pale yellow. General instructions  Gargle with a salt-water mixture 3-4 times per day or as needed. To make a salt-water mixture, completely dissolve -1 tsp of salt in 1 cup of warm water.  Make sure that all household members wash their hands well.  Get plenty of rest.  Stay home from school or work until you have been taking antibiotics for 24 hours.  Keep all follow-up visits as told by your health care provider. This is important. Contact a health care provider if:  The glands in your neck continue to get bigger.  You develop a rash, cough, or earache.  You cough up a thick liquid that is green, yellow-brown, or bloody.  You have pain or discomfort that does not get better with medicine.  Your problems seem to be getting worse rather than better.  You have a fever. Get help right away if:  You have new symptoms, such as vomiting, severe headache, stiff or painful neck, chest pain, or shortness of breath.  You have severe throat pain, drooling, or changes in your voice.  You have swelling of the neck, or the skin on the neck becomes red and tender.  You have signs of dehydration, such as fatigue, dry mouth, and decreased urination.  You become increasingly sleepy, or   you cannot wake up completely.  Your joints become red or painful. This information is not intended to replace advice given to you by your health care provider. Make sure you discuss any questions you have with your health care provider. Document Released: 12/21/1999 Document Revised: 08/22/2015 Document Reviewed: 04/17/2014 Elsevier Interactive Patient Education  2017 [employer]  

## 2016-02-11 ENCOUNTER — Ambulatory Visit (INDEPENDENT_AMBULATORY_CARE_PROVIDER_SITE_OTHER): Payer: Medicare Other | Admitting: Family Medicine

## 2016-02-11 ENCOUNTER — Encounter: Payer: Self-pay | Admitting: Family Medicine

## 2016-02-11 VITALS — BP 122/86 | HR 76 | Temp 98.1°F | Resp 16 | Wt 128.0 lb

## 2016-02-11 DIAGNOSIS — K1379 Other lesions of oral mucosa: Secondary | ICD-10-CM | POA: Diagnosis not present

## 2016-02-11 DIAGNOSIS — I10 Essential (primary) hypertension: Secondary | ICD-10-CM

## 2016-02-11 DIAGNOSIS — N189 Chronic kidney disease, unspecified: Secondary | ICD-10-CM | POA: Diagnosis not present

## 2016-02-11 DIAGNOSIS — Z905 Acquired absence of kidney: Secondary | ICD-10-CM

## 2016-02-11 LAB — CBC WITH DIFFERENTIAL/PLATELET
BASOS ABS: 0.1 10*3/uL (ref 0.0–0.1)
Basophils Relative: 0.8 % (ref 0.0–3.0)
Eosinophils Absolute: 0.1 10*3/uL (ref 0.0–0.7)
Eosinophils Relative: 2.2 % (ref 0.0–5.0)
HEMATOCRIT: 41.6 % (ref 36.0–46.0)
Hemoglobin: 14.4 g/dL (ref 12.0–15.0)
LYMPHS PCT: 24.4 % (ref 12.0–46.0)
Lymphs Abs: 1.5 10*3/uL (ref 0.7–4.0)
MCHC: 34.7 g/dL (ref 30.0–36.0)
MCV: 87.8 fl (ref 78.0–100.0)
MONOS PCT: 8 % (ref 3.0–12.0)
Monocytes Absolute: 0.5 10*3/uL (ref 0.1–1.0)
Neutro Abs: 4 10*3/uL (ref 1.4–7.7)
Neutrophils Relative %: 64.6 % (ref 43.0–77.0)
Platelets: 209 10*3/uL (ref 150.0–400.0)
RBC: 4.73 Mil/uL (ref 3.87–5.11)
RDW: 12.6 % (ref 11.5–15.5)
WBC: 6.2 10*3/uL (ref 4.0–10.5)

## 2016-02-11 LAB — BASIC METABOLIC PANEL
BUN: 24 mg/dL — AB (ref 6–23)
CHLORIDE: 106 meq/L (ref 96–112)
CO2: 30 mEq/L (ref 19–32)
Calcium: 9.7 mg/dL (ref 8.4–10.5)
Creatinine, Ser: 1.16 mg/dL (ref 0.40–1.20)
GFR: 50.4 mL/min — ABNORMAL LOW (ref >60.00)
GLUCOSE: 75 mg/dL (ref 70–99)
POTASSIUM: 4.3 meq/L (ref 3.5–5.1)
SODIUM: 142 meq/L (ref 135–145)

## 2016-02-11 LAB — VITAMIN B12: Vitamin B-12: 312 pg/mL (ref 211–911)

## 2016-02-11 MED ORDER — FLUCONAZOLE 150 MG PO TABS: 150.0000 mg | ORAL_TABLET | Freq: Once | ORAL | 0 refills | Status: AC

## 2016-02-11 MED ORDER — NYSTATIN 100000 UNIT/ML MT SUSP: 5.0000 mL | Freq: Four times a day (QID) | OROMUCOSAL | 0 refills | Status: DC

## 2016-02-11 NOTE — Progress Notes (Signed)
Pre visit review using our clinic review tool, if applicable. No additional management support is needed unless otherwise documented below in the visit note. 

## 2016-02-11 NOTE — Patient Instructions (Signed)
I will call in your Amlodipine for 90 days. Follow up every 6 months on blood pressure.  We will collect some labs today to check blood levels, vit d B and kidney function.   I want you to continue the biotin rinse, but I have also called in a rinse for you to use in addition to the biotin. Use the new one, then after completed you can restart the biotin.  There is one pill to take to start off the treatment as well.   You can continue the ointment a small amount for your nose.   Please help [place] help you:  We are honored you have chosen [facility] for your Primary Care home. Below you will find basic instructions that you may need to access in the future. Please help [place] help you by reading the instructions, which cover many of the frequent questions we experience.   Prescription refills and request:  -In order to allow more efficient response time, please call your pharmacy for all refills. They will forward the request electronically to [place]. This allows for the quickest possible response. Request left on a nurse line can take longer to refill, since these are checked as time allows between office patients and other phone calls.  - refill request can take up to 3-5 working days to complete.  - If request is sent electronically and request is appropiate, it is usually completed in 1-2 business days.  - all patients will need to be seen routinely for all chronic medical conditions requiring prescription medications (see follow-up below). If you are overdue for follow up on your condition, you will be asked to make an appointment and we will call in enough medication to cover you until your appointment (up to 30 days).  - all controlled substances will require a face to face visit to request/refill.  - if you desire your prescriptions to go through a new pharmacy, and have an active script at original pharmacy, you will need to call your pharmacy and have scripts transferred to new pharmacy. This  is completed between the pharmacy locations and not by your provider.    Results: If any images or labs were ordered, it can take up to 1 week to get results depending on the test ordered and the lab/facility running and resulting the test. - Normal or stable results, which do not need further discussion, will be released to your mychart immediately with attached note to you. A call will not be generated for normal results. Please make certain to sign up for mychart. If you have questions on how to activate your mychart you can call the front office.  - If your results need further discussion, our office will attempt to contact you via phone, and if unable to reach you after 2 attempts, we will release your abnormal result to your mychart with instructions.  - All results will be automatically released in mychart after 1 week.  - Your provider will provide you with explanation and instruction on all relevant material in your results. Please keep in mind, results and labs may appear confusing or abnormal to the untrained eye, but it does not mean they are actually abnormal for you personally. If you have any questions about your results that are not covered, or you desire more detailed explanation than what was provided, you should make an appointment with your provider to do so.   Our office handles many outgoing and incoming calls daily. If we have  not contacted you within 1 week about your results, please check your mychart to see if there is a message first and if not, then contact our office.  In helping with this matter, you help decrease call volume, and therefore allow [place] to be able to respond to patients needs more efficiently.   Acute office visits (sick visit):  An acute visit is intended for a new problem and are scheduled in shorter time slots to allow schedule openings for patients with new problems. This is the appropriate visit to discuss a new problem. In order to provide you with  excellent quality medical care with proper time for you to explain your problem, have an exam and receive treatment with instructions, these appointments should be limited to one new problem per visit. If you experience a new problem, in which you desire to be addressed, please make an acute office visit, we save openings on the schedule to accommodate you. Please do not save your new problem for any other type of visit, let us take care of it properly and quickly for you.   Follow up visits:  Depending on your condition(s) your provider will need to see you routinely in order to provide you with quality care and prescribe medication(s). Most chronic conditions (Example: hypertension, Diabetes, depression/anxiety... etc), require visits a couple times a year. Your provider will instruct you on proper follow up for your personal medical conditions and history. Please make certain to make follow up appointments for your condition as instructed. Failing to do so could result in lapse in your medication treatment/refills. If you request a refill, and are overdue to be seen on a condition, we will always provide you with a 30 day script (once) to allow you time to schedule.    Medicare wellness (well visit): - we have a wonderful Nurse Maudie Mercury), that will meet with you and provide you will yearly medicare wellness visits. These visits should occur yearly (can not be scheduled less than 1 calendar year apart) and cover preventive health, immunizations, advance directives and screenings you are entitled to yearly through your medicare benefits. Do not miss out on your entitled benefits, this is when medicare will pay for these benefits to be ordered for you.  These are strongly encouraged by your provider and is the appropriate type of visit to make certain you are up to date with all preventive health benefits. If you have not had your medicare wellness exam in the last 12 months, please make certain to schedule one  by calling the office and schedule your medicare wellness with Maudie Mercury as soon as possible.   Yearly physical (well visit):  - Adults are recommended to be seen yearly for physicals. Check with your insurance and date of your last physical, most insurances require one calendar year between physicals. Physicals include all preventive health topics, screenings, medical exam and labs that are appropriate for gender/age and history. You may have fasting labs needed at this visit. This is a well visit (not a sick visit), acute topics should not be covered during this visit.  - Pediatric patients are seen more frequently when they are younger. Your provider will advise you on well child visit timing that is appropriate for your their age. - This is not a medicare wellness visit. Medicare wellness exams do not have an exam portion to the visit. Some medicare companies allow for a physical, some do not allow a yearly physical. If your medicare allows a yearly physical you  can schedule the medicare wellness with our nurse Maudie Mercury and have your physical with your provider after, on the same day. Please check with insurance for your full benefits.   Late Policy/No Shows:  - all new patients should arrive 15-30 minutes earlier than appointment to allow [place] time  to  obtain all personal demographics,  insurance information and for you to complete office paperwork. - All established patients should arrive 10-15 minutes earlier than appointment time to update all information and be checked in .  - In our best efforts to run on time, if you are late for your appointment you will be asked to either reschedule or if able, we will work you back into the schedule. There will be a wait time to work you back in the schedule,  depending on availability.  - If you are unable to make it to your appointment as scheduled, please call 24 hours ahead of time to allow [place] to fill the time slot with someone else who needs to be seen. If you do  not cancel your appointment ahead of time, you may be charged a no show fee.

## 2016-02-11 NOTE — Progress Notes (Signed)
[name] , February 10, 1955, 61 y.o., female MRN: [id number] Patient Care Team    Relationship Specialty Notifications Start End  [name] Hillock, DO PCP - General Family Medicine  02/11/16     CC: re-establish care  Subjective:   Hypertension/CKD?: Pt presents to re-establish care. She states she had an elevated creatinen a few month sago at other provider and ws told to have her kidney function retested. 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 Pt reports compliance with amlodipine 10 mg QD. Blood pressures ranges at home are normal. Patient denies chest pain, shortness of breath or lower extremity edema.  BMP: 11/06/2015, cr 1.22. CBC: 03/22/2015 Diet: watches her diet closely. Low sodium.  Exercise: she does not exercise but she is active.  RF: 1 kidney (donated)  Mouth lesions: she has noticed lip lesions, mouth lesions inside cheeks. She has been to see an  Chief Financial Officer, and will have 2 teeth removed in a few weeks. Dentures at the top. She is using biotin and it helped resolved. She feels they are worse since her strep in December. She has a history of cold sores.    Allergies  Allergen Reactions  . Ace Inhibitors Cough  . Celebrex [Celecoxib] Hives  . Dilaudid [Hydromorphone] Nausea And Vomiting    Severe N/V  . Lactose Intolerance (Gi) Diarrhea    cramping  . Sudafed [Pseudoephedrine Hcl]     Makes "high"  . Sulfonamide Derivatives Rash   Social History  Substance Use Topics  . Smoking status: Former Smoker    Packs/day: 0.25    Years: 54.00    Types: Cigarettes, E-cigarettes    Quit date: 10/25/2014  . Smokeless tobacco: Never Used  . Alcohol use No   Past Medical History:  Diagnosis Date  . Allergy   . Anxiety   . Arthritis   . Chronic kidney disease    kidney donor, has only one kidney  . Hypertension   . 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 Migraines   . Thyroid  disease    Hx of   . Wears dentures    top  . Wears glasses    Past Surgical History:  Procedure Laterality Date  . ABDOMINAL HYSTERECTOMY  1991   partial  . BLADDER SURGERY     bladder tac  . BOWEL RESECTION    . DORSAL COMPARTMENT RELEASE Right 02/07/2013   Procedure: RIGHT WRIST DEQUERVAIN RELEASE;  Surgeon: Jolyn Nap, MD;  Location: [facility];  Service: Orthopedics;  Laterality: Right;  . KIDNEY DONATION  2008   gave her lt kidney to daughter  . NECK MASS EXCISION     rt-mass  . NECK SURGERY  1990   rt ? parotidectomy  . SHOULDER ARTHROSCOPY WITH OPEN ROTATOR CUFF REPAIR AND DISTAL CLAVICLE ACROMINECTOMY Right 04/25/2014   Procedure: SHOULDER ARTHROSCOPY WITH MINI-OPEN ROTATOR CUFF REPAIR AND DISTAL CLAVICLE RESECTION;  Surgeon: Garald Balding, MD;  Location: [facility];  Service: Orthopedics;  Laterality: Right;  . SHOULDER CLOSED REDUCTION Right 08/31/2014   Procedure: CLOSED MANIPULATION SHOULDER;  Surgeon: Garald Balding, MD;  Location: [facility];  Service: Orthopedics;  Laterality: Right;  . SUBACROMIAL DECOMPRESSION Right 04/25/2014   Procedure: SUBACROMIAL DECOMPRESSION;  Surgeon: Garald Balding, MD;  Location: [facility];  Service: Orthopedics;  Laterality: Right;  .  THYROID SURGERY     bx   Family History  Problem Relation Age of Onset  . Arthritis Mother     rheumatoid  . Hypertension Mother   . Heart disease Mother     Irregular heart beat   . Arrhythmia Mother   . Rheum arthritis Mother   . Pulmonary fibrosis Mother   . Cancer Father     Lung  . COPD Father   . Hypertension Sister   . Aneurysm Brother 11    brain  . Heart disease Maternal Aunt   . Pulmonary fibrosis Maternal Aunt   . Arthritis Maternal Uncle     rheumatoid  . Pulmonary fibrosis Maternal Uncle   . Heart disease Maternal Grandmother   . Diabetes Maternal Uncle   . Hypertension Sister    Allergies as of  02/11/2016      Reactions   Ace Inhibitors Cough   Celebrex [celecoxib] Hives   Dilaudid [hydromorphone] Nausea And Vomiting   Severe N/V   Lactose Intolerance (gi) Diarrhea   cramping   Sudafed [pseudoephedrine Hcl]    Makes "high"   Sulfonamide Derivatives Rash      Medication List       Accurate as of 02/11/16  1:06 PM. Always use your most recent med list.          amLODipine 10 MG tablet Commonly known as:  NORVASC Take 1 tablet (10 mg total) by mouth daily.   amoxicillin 500 MG capsule Commonly known as:  AMOXIL Take 1 capsule (500 mg total) by mouth 3 (three) times daily.   azelastine 0.1 % nasal spray Commonly known as:  ASTELIN Place 1 spray into both nostrils daily. Use in each nostril as directed   fexofenadine 30 MG tablet Commonly known as:  ALLEGRA Take 30 mg by mouth daily as needed.   Vitamin D3 1000 units Caps Take by mouth.       No results found for this or any previous visit (from the past 24 hour(s)). No results found.   ROS: Negative, with the exception of above mentioned in HPI   Objective:  BP 122/86 (BP Location: Left Arm, Patient Position: Sitting, Cuff Size: Normal)   Pulse 76   Temp 98.1 F (36.7 C) (Oral)   Resp 16   Wt 128 lb (58.1 kg)   SpO2 100%   BMI 20.66 kg/m  Body mass index is 20.66 kg/m. Gen: Afebrile. No acute distress. Nontoxic in appearance, well developed, well nourished.  HENT: AT. [place]. Bilateral TM visualized wnl. MMM, small lesion midline lower lip. White easily removed exudative material bilateral buccal mucosa.  Bilateral nares with small sore right upper nare near septum. . Throat without erythema or exudates. No hoarseness.  Eyes:Pupils Equal Round Reactive to light, Extraocular movements intact,  Conjunctiva without redness, discharge or icterus. Neck/lymp/endocrine: Supple,no lymphadenopathy CV: RRR, no edema.  Chest: CTAB, no wheeze or crackles. Good air movement, normal resp effort.  Abd: Soft. NTND.  BS present Neuro: Normal gait. PERLA. EOMi. Alert. Oriented x3   Assessment/Plan: [name] is a 61 y.o. female present for OV to re-establish care with complaints of mouth sores. Essential hypertension, benign/CKD?/h/o nephrectomy: - Low salt diet, exercise > 150 minutes a week.  - CBC w/Diff - Basic Metabolic Panel (BMET) - avoid NSAIDS - refills on amlodipine will be prescribed 90d with 1 refill once pt finds out what mail in pharmacy she is to use. - F/U 6 months  Mouth sores -  Continue Biotin rinse after use of nystatin swish is completed. Mild white residue, easily removed was appreciated bilateral buccal mucosa.  - B12 - fluconazole (DIFLUCAN) 150 MG tablet; Take 1 tablet (150 mg total) by mouth once.  Dispense: 1 tablet; Refill: 0 - nystatin (MYCOSTATIN) 100000 UNIT/ML suspension; Take 5 mLs (500,000 Units total) by mouth 4 (four) times daily.  Dispense: 60 mL; Refill: 0 - F/u PRN  electronically signed by:  Howard Pouch, DO  [place]

## 2016-02-12 ENCOUNTER — Telehealth: Payer: Self-pay | Admitting: Family Medicine

## 2016-02-12 MED ORDER — VITAMIN B-12 1000 MCG PO TABS: ORAL_TABLET | ORAL | 0 refills | Status: DC

## 2016-02-12 NOTE — Telephone Encounter (Signed)
Please call pt: - her kidney function is stable and consistent with prior collections. She should be encouraged to increase her water consumption.  - Her b12 was low normal and she should take a supplement. I have called in the start of one for, she should then take an OTC b12 supplement.

## 2016-02-12 NOTE — Telephone Encounter (Signed)
Patient notified and verbalized understanding. Patient repeated instructions.

## 2016-02-20 ENCOUNTER — Telehealth: Payer: Self-pay | Admitting: Family Medicine

## 2016-02-20 NOTE — Telephone Encounter (Signed)
Patient inquiring about electrolytes which are normal on her last lab results. Patient notified. She also asked about pickle juice helping with her arm pain. Patient informed that it was fine to drink or eat pickles.  Dr. Raoul Pitch notified. Patient will schedule appointment is symptoms persist.

## 2016-02-20 NOTE — Telephone Encounter (Signed)
Patient is calling with questions regarding her blood pressure and pressure in her arms.  Says Dr. Raoul Pitch mentioned something about her electrolytes.  Thank you,  -LL

## 2016-02-26 ENCOUNTER — Telehealth: Payer: Self-pay | Admitting: Family Medicine

## 2016-02-26 NOTE — Telephone Encounter (Signed)
Patient requesting refill of amLODipine (NORVASC) 10 MG tablet.  She states she only has enough pills to get her through to Sunday and she uses mail order pharmacy.    Pharmacy: [place], [place] [employer] [facility] [phone] (Phone) [phone] (Fax)

## 2016-02-27 ENCOUNTER — Other Ambulatory Visit: Payer: Self-pay | Admitting: *Deleted

## 2016-02-27 DIAGNOSIS — I10 Essential (primary) hypertension: Secondary | ICD-10-CM

## 2016-02-27 MED ORDER — AMLODIPINE BESYLATE 10 MG PO TABS: 10.0000 mg | ORAL_TABLET | Freq: Every day | ORAL | 1 refills | Status: DC

## 2016-03-03 ENCOUNTER — Telehealth: Payer: Self-pay | Admitting: Family Medicine

## 2016-03-03 NOTE — Telephone Encounter (Signed)
Scheduled patient to be seen

## 2016-03-03 NOTE — Telephone Encounter (Signed)
Patient called to advise that she has had cold sores to come back all over the outside/ inside of the mouth. She states that they are the same as they were before on the 02/11/2016 visit. Please advise patient on how she should proceed.

## 2016-03-04 ENCOUNTER — Ambulatory Visit (INDEPENDENT_AMBULATORY_CARE_PROVIDER_SITE_OTHER): Payer: Medicare Other | Admitting: Family Medicine

## 2016-03-04 ENCOUNTER — Encounter: Payer: Self-pay | Admitting: Family Medicine

## 2016-03-04 VITALS — BP 122/78 | HR 81 | Temp 97.8°F | Resp 20 | Wt 129.0 lb

## 2016-03-04 DIAGNOSIS — B001 Herpesviral vesicular dermatitis: Secondary | ICD-10-CM

## 2016-03-04 MED ORDER — VALACYCLOVIR HCL 1 G PO TABS: ORAL_TABLET | ORAL | 0 refills | Status: DC

## 2016-03-04 NOTE — Progress Notes (Signed)
REILEY UPRIGHT , 1955-01-15, 61 y.o., female MRN: [id number] Patient Care Team    Relationship Specialty Notifications Start End  Ma Hillock, DO PCP - General Family Medicine  02/11/16     [id number] lesions  Subjective:   Lip lesion: Pt reports a small ulcer on her lower lip for the last 3 days. She has had a h/o cold sores many years ago. She is using a topical ointment for cold sores.  Allergies  Allergen Reactions  . Ace Inhibitors Cough  . Celebrex [Celecoxib] Hives  . Dilaudid [Hydromorphone] Nausea And Vomiting    Severe N/V  . Lactose Intolerance (Gi) Diarrhea    cramping  . Sudafed [Pseudoephedrine Hcl]     Makes "high"  . Sulfonamide Derivatives Rash   Social History  Substance Use Topics  . Smoking status: Former Smoker    Packs/day: 0.25    Years: 54.00    Types: Cigarettes, E-cigarettes    Quit date: 10/25/2014  . Smokeless tobacco: Never Used  . Alcohol use No   Past Medical History:  Diagnosis Date  . Allergy   . Anxiety   . Arthritis   . Chronic kidney disease    kidney donor, has only one kidney  . Hypertension   . 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 Migraines   . Thyroid disease    Hx of   . Wears dentures    top  . Wears glasses    Past Surgical History:  Procedure Laterality Date  . ABDOMINAL HYSTERECTOMY  1991   partial  . BLADDER SURGERY     bladder tac  . BOWEL RESECTION    . DORSAL COMPARTMENT RELEASE Right 02/07/2013   Procedure: RIGHT WRIST DEQUERVAIN RELEASE;  Surgeon: Jolyn Nap, MD;  Location: [facility];  Service: Orthopedics;  Laterality: Right;  . KIDNEY DONATION  2008   gave her lt kidney to daughter  . NECK MASS EXCISION     rt-mass  . NECK SURGERY  1990   rt ? parotidectomy  . SHOULDER ARTHROSCOPY WITH OPEN ROTATOR CUFF REPAIR AND DISTAL CLAVICLE ACROMINECTOMY Right 04/25/2014   Procedure: SHOULDER ARTHROSCOPY WITH MINI-OPEN ROTATOR CUFF REPAIR AND DISTAL CLAVICLE  RESECTION;  Surgeon: Garald Balding, MD;  Location: [facility];  Service: Orthopedics;  Laterality: Right;  . SHOULDER CLOSED REDUCTION Right 08/31/2014   Procedure: CLOSED MANIPULATION SHOULDER;  Surgeon: Garald Balding, MD;  Location: [facility];  Service: Orthopedics;  Laterality: Right;  . SUBACROMIAL DECOMPRESSION Right 04/25/2014   Procedure: SUBACROMIAL DECOMPRESSION;  Surgeon: Garald Balding, MD;  Location: [facility];  Service: Orthopedics;  Laterality: Right;  . THYROID SURGERY     bx   Family History  Problem Relation Age of Onset  . Arthritis Mother     rheumatoid  . Hypertension Mother   . Heart disease Mother     Irregular heart beat   . Arrhythmia Mother   . Rheum arthritis Mother   . Pulmonary fibrosis Mother   . Cancer Father     Lung  . COPD Father   . Hypertension Sister   . Aneurysm Brother 11    brain  . Heart disease Maternal Aunt   . Pulmonary fibrosis Maternal Aunt   . Arthritis Maternal Uncle     rheumatoid  . Pulmonary fibrosis Maternal Uncle   . Heart disease Maternal Grandmother   . Diabetes Maternal  Uncle   . Hypertension Sister    Allergies as of 03/04/2016      Reactions   Ace Inhibitors Cough   Celebrex [celecoxib] Hives   Dilaudid [hydromorphone] Nausea And Vomiting   Severe N/V   Lactose Intolerance (gi) Diarrhea   cramping   Sudafed [pseudoephedrine Hcl]    Makes "high"   Sulfonamide Derivatives Rash      Medication List       Accurate as of 03/04/16  2:49 PM. Always use your most recent med list.          amLODipine 10 MG tablet Commonly known as:  NORVASC Take 1 tablet (10 mg total) by mouth daily.   azelastine 0.1 % nasal spray Commonly known as:  ASTELIN Place 1 spray into both nostrils daily. Use in each nostril as directed   fexofenadine 30 MG tablet Commonly known as:  ALLEGRA Take 30 mg by mouth daily as needed.   vitamin B-12 1000 MCG tablet Commonly known  as:  CYANOCOBALAMIN 1000 mcg daily for 7 days and then 1000 mcg weekly   Vitamin D3 1000 units Caps Take by mouth.       No results found for this or any previous visit (from the past 24 hour(s)). No results found.   ROS: Negative, with the exception of above mentioned in HPI   Objective:  BP 122/78 (BP Location: Right Arm, Patient Position: Sitting, Cuff Size: Normal)   Pulse 81   Temp 97.8 F (36.6 C)   Resp 20   Wt 129 lb (58.5 kg)   SpO2 98%   BMI 20.82 kg/m  Body mass index is 20.82 kg/m. Gen: Afebrile. No acute distress.  HENT: AT. [place].  MMM, small ulceration midline lower lip. No swelling or drainage. Eyes:Pupils Equal Round Reactive to light, Extraocular movements intact,  Conjunctiva without redness, discharge or icterus. Skin: no rashes, purpura or petechiae.   Assessment/Plan: OLIVIAGRACE TERNES is a 61 y.o. female present for OV  Cold Sore: - Valtrex 2g once, then repeat in 12 hours once.  - F/U PRN  electronically signed by:  Howard Pouch, DO  [place]

## 2016-03-04 NOTE — Patient Instructions (Signed)
Take 2 pills now and then repeat 2 pills in 12 hours.  Save the other pills if you get a new infection.     Cold Sore A cold sore, also called a fever blister, is a skin infection that is caused by a virus. This infection causes small, fluid-filled sores to form inside of the mouth or on the lips, gums, nose, chin, or cheeks. Cold sores can spread to other parts of the body, such as the eyes or fingers. Cold sores can be spread or passed from person to person (contagious) until the sores crust over completely. Cold sores can be spread through close contact, such as kissing or sharing a drinking glass. Follow these instructions at home: Medicines   Take or apply over-the-counter and prescription medicines only as told by your doctor.  Use a cotton-tip swab to apply creams or gels to your sores. Sore Care   Do not touch the sores or pick the scabs.  Wash your hands often. Do not touch your eyes without washing your hands first.  Keep the sores clean and dry.  If directed, apply ice to the sores:  Put ice in a plastic bag.  Place a towel between your skin and the bag.  Leave the ice on for 20 minutes, 2-3 times per day. Lifestyle   Do not kiss, have oral sex, or share personal items until your sores heal.  Eat a soft, bland diet. Avoid eating hot, cold, or salty foods. These can hurt your mouth.  Use a straw if it hurts to drink out of a glass.  Avoid the sun and limit your stress if these things trigger outbreaks. If sun causes cold sores, apply sunscreen on your lips before being out in the sun. Contact a doctor if:  You have symptoms for more than two weeks.  You have pus coming from the sores.  You have redness that is spreading.  You have pain or irritation in your eye.  You get sores on your genitals.  Your sores do not heal within two weeks.  You get cold sores often. Get help right away if:  You have a fever and your symptoms suddenly get worse.  You have  a headache and confusion. This information is not intended to replace advice given to you by your health care provider. Make sure you discuss any questions you have with your health care provider. Document Released: 06/24/2011 Document Revised: 05/31/2015 Document Reviewed: 10/13/2014 Elsevier Interactive Patient Education  2017 [employer].

## 2016-03-07 ENCOUNTER — Telehealth: Payer: Self-pay | Admitting: Family Medicine

## 2016-03-07 MED ORDER — LOSARTAN POTASSIUM 50 MG PO TABS: 50.0000 mg | ORAL_TABLET | Freq: Every day | ORAL | 1 refills | Status: DC

## 2016-03-07 NOTE — Telephone Encounter (Signed)
Please call pt: - I do not think she has truly given her body time to heal the lesion after valtrex.  - However, I am willing to try her on a different medicine for her BP.  - STOP amlodipine, start losartan. This has been called in for her. She will need to f/u in next week for BP check on new med and tapering new medication until BP controlled on med. MAKE SURE she takes her medicine at least 2 hours before her appt.

## 2016-03-07 NOTE — Addendum Note (Signed)
Addended by: Howard Pouch A on: 03/07/2016 11:42 AM   Modules accepted: Orders

## 2016-03-07 NOTE — Addendum Note (Signed)
Addended by: Leota Jacobsen on: 03/07/2016 03:55 PM   Modules accepted: Orders

## 2016-03-07 NOTE — Telephone Encounter (Signed)
Patient states she cant take losartan it causes her heart palpitations. She said she will continue with the amlodipine over the weekend and give the lesion more time to heal if no improvement she will call back on Monday.

## 2016-03-07 NOTE — Telephone Encounter (Signed)
Patient states that lip blister has not gone away. The valACYclovir (VALTREX) 1000 MG tablet is not working, blister is still there. She has taken 2 the day she came then 12 hours later took 2 more and it has not gone away. Patient thinks it is her medication. Please call patient to advise at [phone].

## 2016-03-24 ENCOUNTER — Telehealth: Payer: Self-pay | Admitting: Family Medicine

## 2016-03-24 MED ORDER — VALSARTAN 80 MG PO TABS: 80.0000 mg | ORAL_TABLET | Freq: Every day | ORAL | 0 refills | Status: DC

## 2016-03-24 NOTE — Telephone Encounter (Signed)
We can DC the amlodipine and start a medicine called Diovan. The dosage will need to be adjusted. I will start her on diovan 80 mg a day and she will need to follow up on Thursday to check BP. I am out of town  After that so I would like to be able to see her by then and adjust dose up if needed.

## 2016-03-24 NOTE — Telephone Encounter (Signed)
Patient is broken out on lip from blood pressure medicine. She would like to try a different bp medication.  Thank you,  -LL

## 2016-03-24 NOTE — Telephone Encounter (Signed)
Patient notified and verbalized understanding. Nursing visit scheduled for BP check.

## 2016-03-27 ENCOUNTER — Encounter: Payer: Self-pay | Admitting: *Deleted

## 2016-03-27 ENCOUNTER — Ambulatory Visit (INDEPENDENT_AMBULATORY_CARE_PROVIDER_SITE_OTHER): Payer: Medicare Other | Admitting: Family Medicine

## 2016-03-27 VITALS — BP 127/78 | HR 69

## 2016-03-27 DIAGNOSIS — I10 Essential (primary) hypertension: Secondary | ICD-10-CM | POA: Diagnosis not present

## 2016-03-27 NOTE — Progress Notes (Signed)
BP 127/78 (BP Location: Right Arm, Patient Position: Sitting, Cuff Size: Normal)   Pulse 69  Patient presents today for blood pressure check per Dr Raoul Pitch. Patient BP within normal parameters. Patient will follow up with Dr Raoul Pitch as directed for her chronic conditions.  Medical screening examination/treatment/procedure(s) were performed by non-physician practitioner and as supervising physician I was immediately available for consultation/collaboration.  I agree with above assessment and plan.  Electronically Signed by: Howard Pouch, DO  primary [place]

## 2016-04-02 ENCOUNTER — Telehealth: Payer: Self-pay | Admitting: Family Medicine

## 2016-04-02 MED ORDER — METOPROLOL SUCCINATE ER 25 MG PO TB24: 25.0000 mg | ORAL_TABLET | Freq: Every day | ORAL | 0 refills | Status: DC

## 2016-04-02 NOTE — Telephone Encounter (Signed)
Patient complains that the valsartan (DIOVAN) 80 MG tablet  has her hand hurting and she's having joint pain and headaches. Please call patient to advise on home phone. Okay to leave a detailed message on home phone.

## 2016-04-02 NOTE — Telephone Encounter (Signed)
Stop diovan. I sent in toprol xl 25mg  for her to start taking once daily---start this 3 days after stopping the diovan.-thx

## 2016-04-02 NOTE — Telephone Encounter (Signed)
Spoke with patient reviewed  instructions. Patient verbalized understanding. 

## 2016-04-07 ENCOUNTER — Telehealth: Payer: Self-pay | Admitting: Family Medicine

## 2016-04-07 NOTE — Telephone Encounter (Signed)
Patient is taking metoprolol succinate (TOPROL-XL) 25 MG 24 hr tablet since Saturday and has a sore throat. Patient is not sure if she should stop taking it and just take her fexofenadine (ALLEGRA) 30 MG tablet.   Please call patient today to advise. Okay to leave a detailed message on patient phone.

## 2016-04-07 NOTE — Telephone Encounter (Signed)
Left message for patient ok to take allegra do not stop metoprolol if symptoms persist call to schedule appt.

## 2016-04-11 ENCOUNTER — Encounter: Payer: Self-pay | Admitting: Family Medicine

## 2016-04-11 ENCOUNTER — Ambulatory Visit (INDEPENDENT_AMBULATORY_CARE_PROVIDER_SITE_OTHER): Payer: Medicare Other | Admitting: Family Medicine

## 2016-04-11 VITALS — BP 124/60 | HR 59 | Temp 97.9°F | Resp 18 | Wt 130.0 lb

## 2016-04-11 DIAGNOSIS — J40 Bronchitis, not specified as acute or chronic: Secondary | ICD-10-CM

## 2016-04-11 MED ORDER — FLUTICASONE PROPIONATE 50 MCG/ACT NA SUSP: 2.0000 | Freq: Every day | NASAL | 6 refills | Status: DC

## 2016-04-11 MED ORDER — AZITHROMYCIN 250 MG PO TABS: ORAL_TABLET | ORAL | 0 refills | Status: DC

## 2016-04-11 NOTE — Patient Instructions (Addendum)
Coricidin over the counter.  Flonase and Z-pack called in for you.  Rest and hydrate Continue allegra daily.    Part of this is allergy related.

## 2016-04-11 NOTE — Progress Notes (Signed)
[name] , May 01, 1955, 61 y.o., female MRN: [id number] Patient Care Team    Relationship Specialty Notifications Start End  Ma Hillock, DO PCP - General Family Medicine  02/11/16     CC: URI like symptoms Subjective: Pt presents for an acute OV with complaints of URI like symptoms of 3 days duration.  Associated symptoms include nasal congestion, chest burning, cough, ear fullness and chills. She has tried allegra, nasal irrigation, Astelin without resolution.   Allergic to sulf abx only.   No flowsheet data found.  Allergies  Allergen Reactions  . Ace Inhibitors Cough  . Celebrex [Celecoxib] Hives  . Dilaudid [Hydromorphone] Nausea And Vomiting    Severe N/V  . Lactose Intolerance (Gi) Diarrhea    cramping  . Sudafed [Pseudoephedrine Hcl]     Makes "high"  . Losartan Palpitations  . Sulfonamide Derivatives Rash   Social History  Substance Use Topics  . Smoking status: Former Smoker    Packs/day: 0.25    Years: 54.00    Types: Cigarettes, E-cigarettes    Quit date: 10/25/2014  . Smokeless tobacco: Never Used  . Alcohol use No   Past Medical History:  Diagnosis Date  . Allergy   . Anxiety   . Arthritis   . Chronic kidney disease    kidney donor, has only one kidney  . Hypertension   . 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 Migraines   . Thyroid disease    Hx of   . Wears dentures    top  . Wears glasses    Past Surgical History:  Procedure Laterality Date  . ABDOMINAL HYSTERECTOMY  1991   partial  . BLADDER SURGERY     bladder tac  . BOWEL RESECTION    . DORSAL COMPARTMENT RELEASE Right 02/07/2013   Procedure: RIGHT WRIST DEQUERVAIN RELEASE;  Surgeon: Jolyn Nap, MD;  Location: [place];  Service: Orthopedics;  Laterality: Right;  . KIDNEY DONATION  2008   gave her lt kidney to daughter  . NECK MASS EXCISION     rt-mass  . NECK SURGERY  1990   rt ? parotidectomy  . SHOULDER ARTHROSCOPY WITH OPEN  ROTATOR CUFF REPAIR AND DISTAL CLAVICLE ACROMINECTOMY Right 04/25/2014   Procedure: SHOULDER ARTHROSCOPY WITH MINI-OPEN ROTATOR CUFF REPAIR AND DISTAL CLAVICLE RESECTION;  Surgeon: Garald Balding, MD;  Location: [place];  Service: Orthopedics;  Laterality: Right;  . SHOULDER CLOSED REDUCTION Right 08/31/2014   Procedure: CLOSED MANIPULATION SHOULDER;  Surgeon: Garald Balding, MD;  Location: [place];  Service: Orthopedics;  Laterality: Right;  . SUBACROMIAL DECOMPRESSION Right 04/25/2014   Procedure: SUBACROMIAL DECOMPRESSION;  Surgeon: Garald Balding, MD;  Location: [place];  Service: Orthopedics;  Laterality: Right;  . THYROID SURGERY     bx   Family History  Problem Relation Age of Onset  . Arthritis Mother     rheumatoid  . Hypertension Mother   . Heart disease Mother     Irregular heart beat   . Arrhythmia Mother   . Rheum arthritis Mother   . Pulmonary fibrosis Mother   . Cancer Father     Lung  . COPD Father   . Hypertension Sister   . Aneurysm Brother 11    brain  . Heart disease Maternal Aunt   . Pulmonary fibrosis Maternal Aunt   . Arthritis Maternal Uncle  rheumatoid  . Pulmonary fibrosis Maternal Uncle   . Heart disease Maternal Grandmother   . Diabetes Maternal Uncle   . Hypertension Sister    Allergies as of 04/11/2016      Reactions   Ace Inhibitors Cough   Celebrex [celecoxib] Hives   Dilaudid [hydromorphone] Nausea And Vomiting   Severe N/V   Lactose Intolerance (gi) Diarrhea   cramping   Sudafed [pseudoephedrine Hcl]    Makes "high"   Losartan Palpitations   Sulfonamide Derivatives Rash      Medication List       Accurate as of 04/11/16 10:25 AM. Always use your most recent med list.          azelastine 0.1 % nasal spray Commonly known as:  ASTELIN Place 1 spray into both nostrils daily. Use in each nostril as directed   fexofenadine 30 MG tablet Commonly known as:  ALLEGRA Take  30 mg by mouth daily as needed.   metoprolol succinate 25 MG 24 hr tablet Commonly known as:  TOPROL-XL Take 1 tablet (25 mg total) by mouth daily.   valACYclovir 1000 MG tablet Commonly known as:  VALTREX 2000 (2 tabs) mg once then repeat 2000 (2 tabs) mg in 12 hours.   vitamin B-12 1000 MCG tablet Commonly known as:  CYANOCOBALAMIN 1000 mcg daily for 7 days and then 1000 mcg weekly   Vitamin D3 1000 units Caps Take by mouth.       No results found for this or any previous visit (from the past 24 hour(s)). No results found.   ROS: Negative, with the exception of above mentioned in HPI   Objective:  BP 124/60 (BP Location: Right Arm, Patient Position: Sitting, Cuff Size: Normal)   Pulse (!) 59   Temp 97.9 F (36.6 C) (Oral)   Resp 18   Wt 130 lb (59 kg)   SpO2 97%   BMI 20.98 kg/m  Body mass index is 20.98 kg/m. Gen: Afebrile. No acute distress. Nontoxic in appearance, well developed, well nourished.  HENT: AT. [place]. Bilateral TM visualized bilateral fullness. MMM, no oral lesions. Bilateral nares with erythema, drainage. Throat without erythema or exudates. PND, cough, hoarseness.  Eyes:Pupils Equal Round Reactive to light, Extraocular movements intact,  Conjunctiva without redness, discharge or icterus. Neck/lymp/endocrine: Supple,no lymphadenopathy CV: RRR , no edema Chest: CTAB, no wheeze or crackles. Good air movement, normal resp effort.  Abd: Soft. NTND. BS present. no Masses palpated.  Assessment/Plan: [name] is a 61 y.o. female present for OV for  Bronchitis - rest, hydrate, coricidin, allergy regimen.  - fluticasone (FLONASE) 50 MCG/ACT nasal spray; Place 2 sprays into both nostrils daily.  Dispense: 16 g; Refill: 6 - azithromycin (ZITHROMAX Z-PAK) 250 MG tablet; 500 mg day 1, then 250 mg qd  Dispense: 6 each; Refill: 0 - f/u prn  Reviewed expectations re: course of current medical issues.  Discussed self-management of symptoms.  Outlined  signs and symptoms indicating need for more acute intervention.  Patient verbalized understanding and all questions were answered.  Patient received an After-Visit Summary.   electronically signed by:  Howard Pouch, DO  [place]

## 2016-04-11 NOTE — Progress Notes (Signed)
Pre visit review using our clinic review tool, if applicable. No additional management support is needed unless otherwise documented below in the visit note. 

## 2016-04-14 ENCOUNTER — Telehealth: Payer: Self-pay

## 2016-04-14 NOTE — Telephone Encounter (Signed)
Patient called stating that she wasn't any better. She still has another day of the antibiotic but wanted to call and see if there was anything else she can do.  Patient instructed to finish the antibiotic and that it would be in her system after she finishes but if she was feeling worse or no better in a few days to call back and schedule an appointment to be seen.

## 2016-04-15 IMAGING — CR DG CHEST 2V
2 series · 2 of 2 positions shown · non-contrast
Comparison: 08/07/2013 and prior chest radiographs dating back to
8110

CLINICAL DATA: 58-year-old female with productive cough and
congestion for 1 month. Right chest pain. History of smoking.

EXAM:
CHEST  2 VIEW

[view not recorded (1 of 2)]
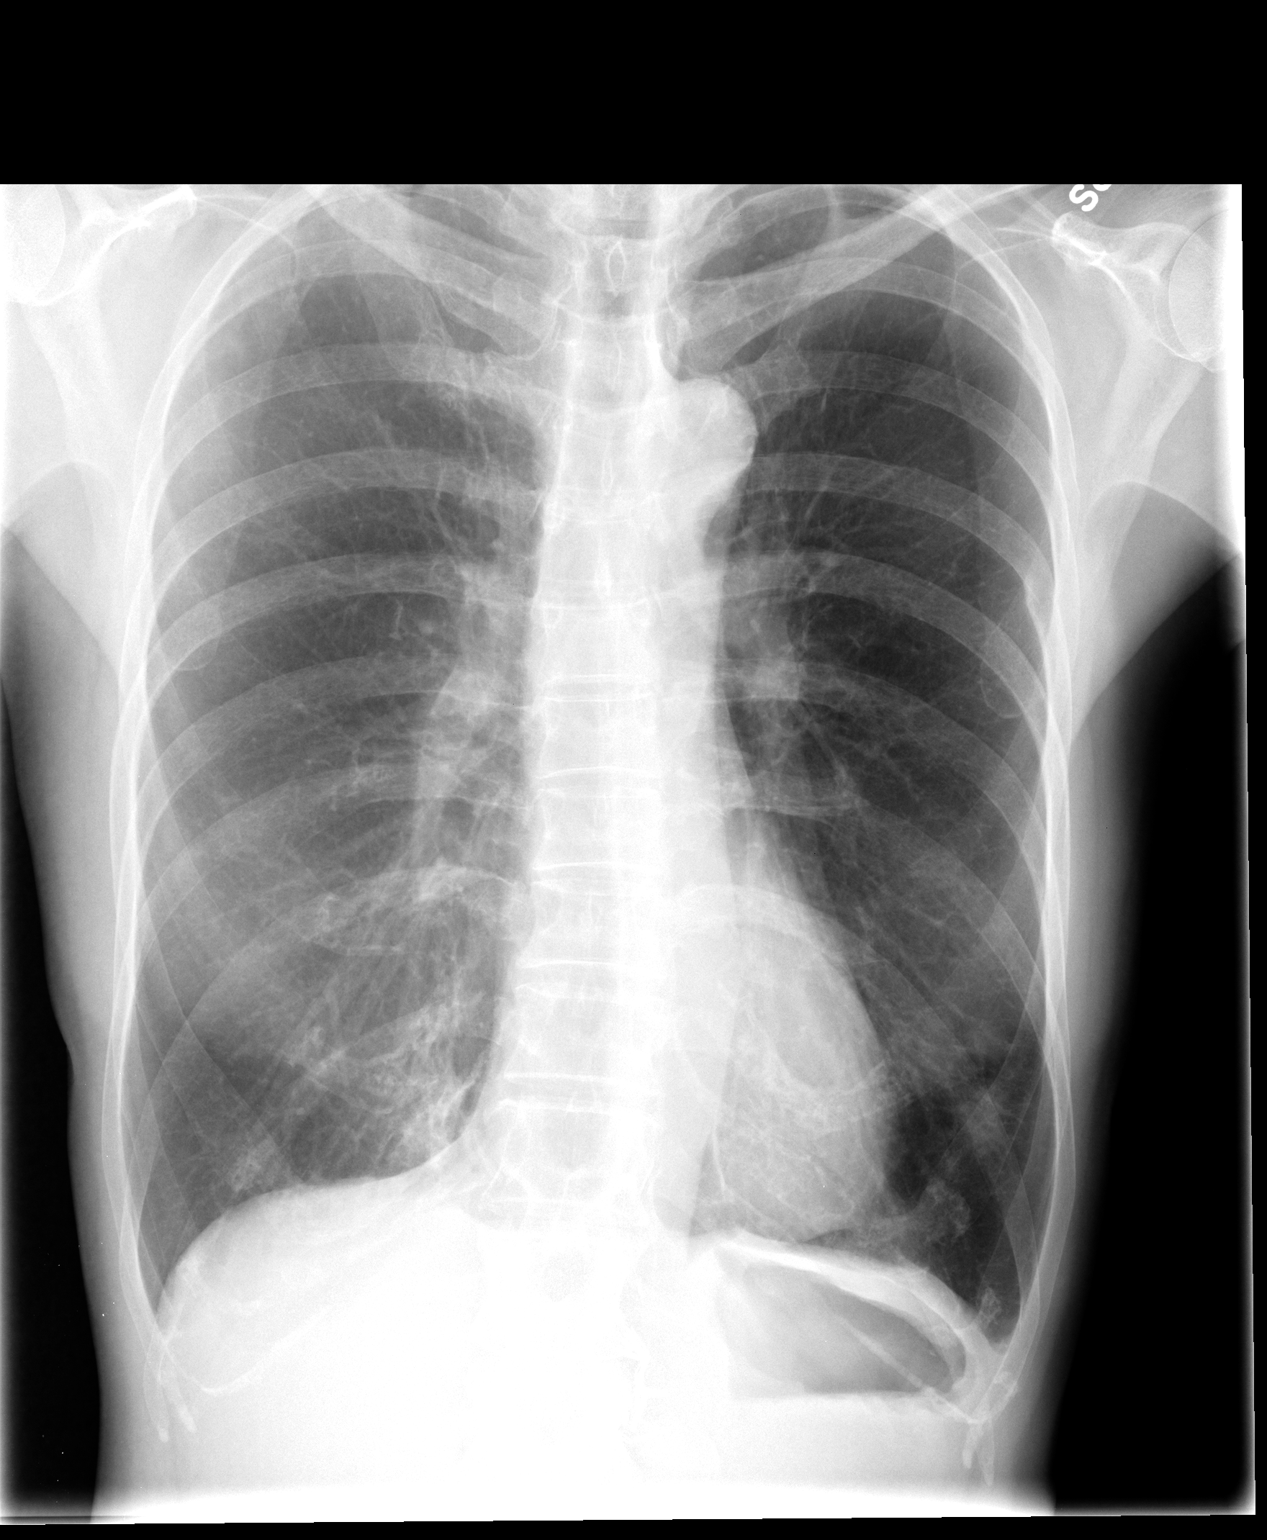

[view not recorded (2 of 2)]
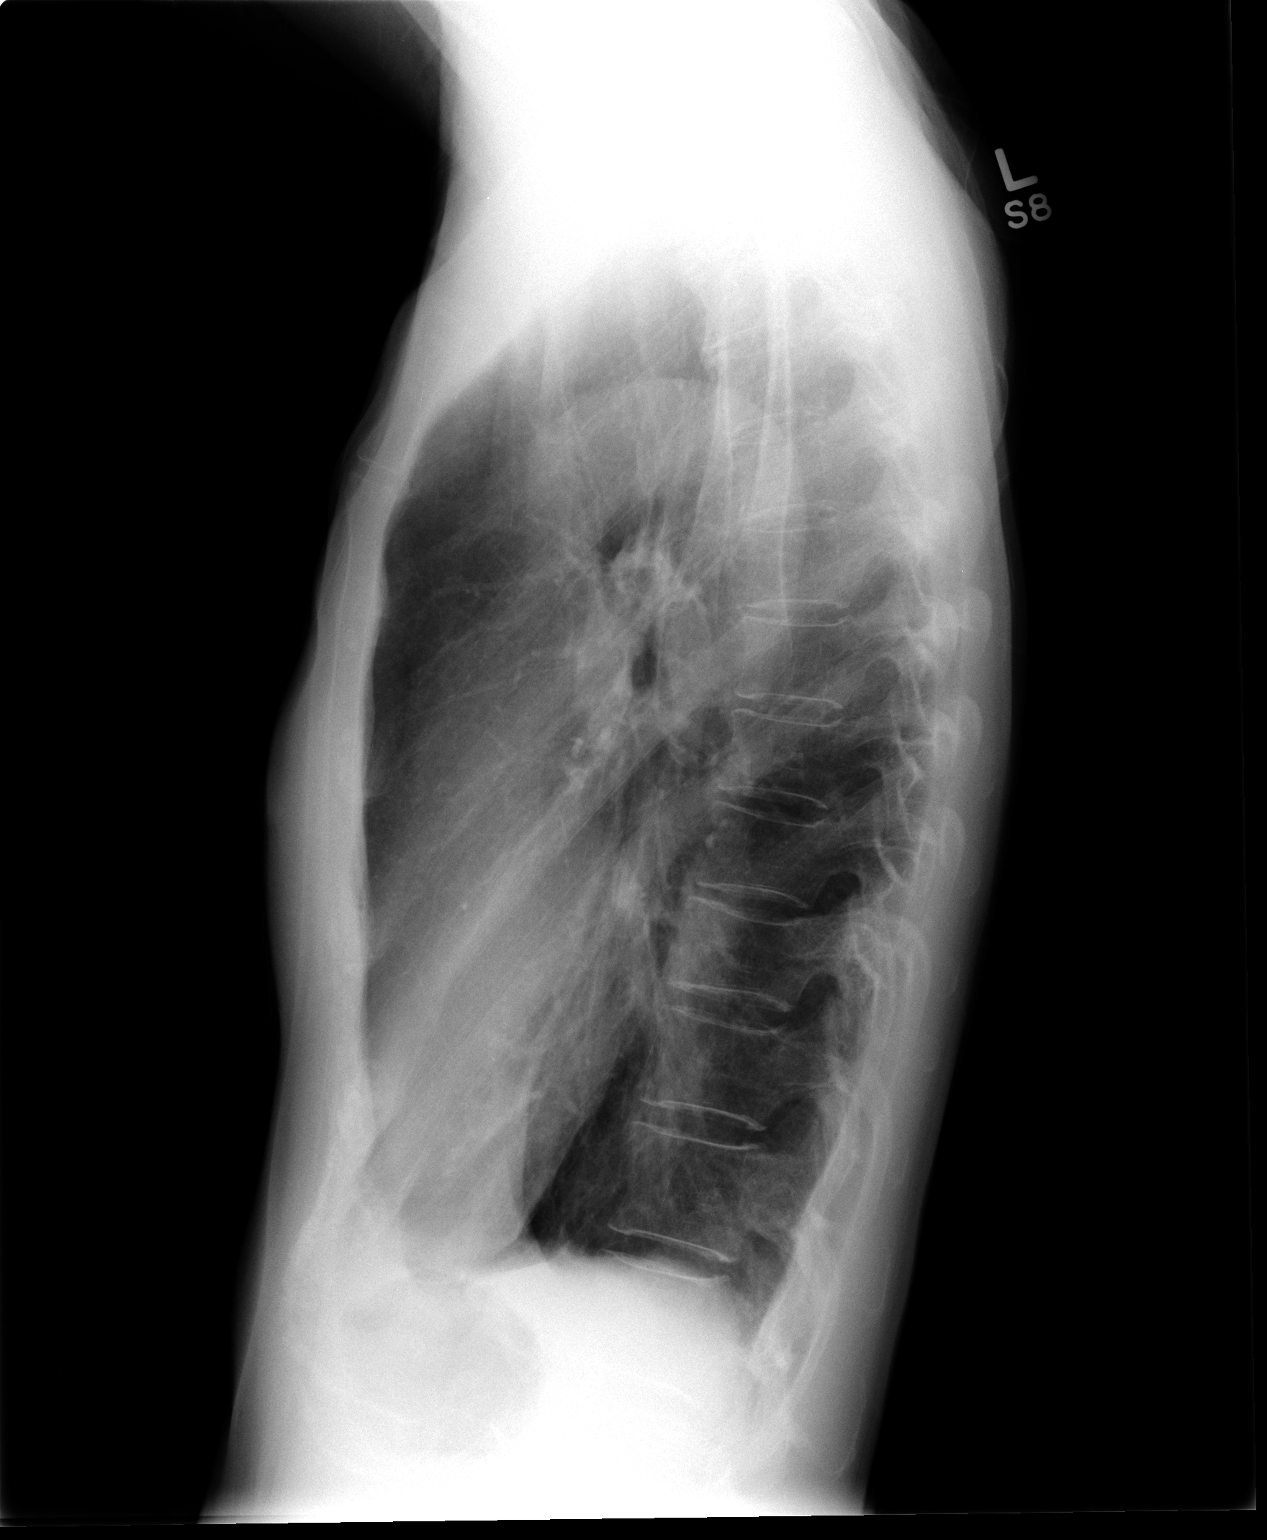

[2 of 2 positions shown; findings below may reference images not displayed]

FINDINGS: The cardiomediastinal silhouette is unremarkable.

Hyperinflation/emphysema changes noted.

Equivocal mild lingular airspace opacity noted.

There is no evidence of pulmonary edema, suspicious pulmonary
nodule/mass, pleural effusion, or pneumothorax. No acute bony
abnormalities are identified.
IMPRESSION: Equivocal mild lingular airspace opacity/pneumonia. Radiographic
followup to resolution recommended.

COPD/emphysema.

## 2016-04-28 ENCOUNTER — Telehealth: Payer: Self-pay | Admitting: Family Medicine

## 2016-04-28 NOTE — Telephone Encounter (Signed)
Her BP is normal because of the medication. The current symptoms of recurrent mouth lesions are not from medications. We have changed the medications multiple times and the mouth lesions remain, therefore not the medication.  The other symptoms could be viral in cause, I would give a few days and follow up next week if worsening or not resolved.

## 2016-04-28 NOTE — Telephone Encounter (Signed)
Patient requesting to change her bp med. She had a [facility] nurse come to her house, her bp was 122/94. Patient has also developed sores in the mouth, pain "all over her body", and hot flashes since being on current bp med. She states she hasn't had a hot flash since she was 61 years old. Please call her back.

## 2016-04-29 ENCOUNTER — Encounter: Payer: Self-pay | Admitting: *Deleted

## 2016-04-29 NOTE — Telephone Encounter (Signed)
Patient called back she states she did not have sores in her mouth just having abdominal pain ,body aches and hot flashes. She states she currently has a virus that she caught from her granddaughter causing diarrhea and vomiting so she is not sure if her Sx were from that coming on . Patient was offered an appt but stated she was going to try some dry toast and see if she can keep it down. She states she will see if Sx persist after her GI issues resolve. Patient advised to call to schedule an appt if needed.

## 2016-04-29 NOTE — Telephone Encounter (Signed)
Left a detailed message on patient voice mail per DPR. Also sent message in my chart

## 2016-05-01 ENCOUNTER — Telehealth: Payer: Self-pay | Admitting: Family Medicine

## 2016-05-01 NOTE — Telephone Encounter (Signed)
Patient states that she CAN NOT take metoprolol, she says every single bone in her body is hurting.  She said all she is doing is "eating tylenol" on this medication.  She states she can handles sores in her mouth much better than this pain.  Patient states she would rather just stay on amlodpine.  She states that she will not take anything if she is made to take the metoprolol or she will just try to find another doctor.

## 2016-05-02 ENCOUNTER — Telehealth: Payer: Self-pay | Admitting: Family Medicine

## 2016-05-02 ENCOUNTER — Encounter: Payer: Self-pay | Admitting: *Deleted

## 2016-05-02 NOTE — Telephone Encounter (Signed)
Sent patient a message in My Chart letting her know this can be done at her Annual Physical appt  And is an optional lab test and is available at her request at that appt.

## 2016-05-02 NOTE — Telephone Encounter (Signed)
Here is an additional message that came through with my response.  No problem. [name] ===View-only below this line===   ----- Message -----    From: Paschal Dopp. [place]    Sent: 05/02/2016 12:53 PM EDT      To: Patient HM Schedule Request Mailing List Subject: RE: Appointment Request (HM)  Appointment Request From: Paschal Dopp. Menge  With Provider:   Reason: To address the following health maintenance concerns.  Comments: Sorry I just ask cause the health care lady said I need on that's all but do i

## 2016-05-02 NOTE — Telephone Encounter (Signed)
I received a MyChart message from the patient as follows: Appointment Request From: [name]    With Provider: Howard Pouch, DO [[facility]    Preferred Date Range: From 05/01/2016 To 05/07/2016    Preferred Times: Any    Reason: To address the following health maintenance concerns.  Hepatitis C Screening    Comments:   Please advise. Thanks [name]

## 2016-05-05 ENCOUNTER — Telehealth: Payer: Self-pay | Admitting: Family Medicine

## 2016-05-05 DIAGNOSIS — Z524 Kidney donor: Secondary | ICD-10-CM

## 2016-05-05 NOTE — Telephone Encounter (Signed)
Patient requesting referral to Dr. Lorrene Reid at [facility]  She states she only has once kidney and needs to start seeing a kidney doctor.

## 2016-05-05 NOTE — Telephone Encounter (Signed)
Sure. Place referral for her today.,

## 2016-05-06 ENCOUNTER — Encounter: Payer: Self-pay | Admitting: *Deleted

## 2016-05-06 NOTE — Telephone Encounter (Signed)
Patients states that she has gone on amlodipine again. She will begin seeing Dr Lorrene Reid for her BP concerns. States that she is 'just weird' and she is not making the sx up. Every doctor she sees tells her she is weird she says. Pt also says that she has one kidney and this can cause her complications. Has a 60 ds of amlodipine that she will continue to take until she sees Dr Lorrene Reid. She will continue to see Dr Raoul Pitch for the rest of her care.

## 2016-05-06 NOTE — Telephone Encounter (Signed)
Noted. I will not provide her with any blood pressure advise or medications concerning her blood pressure. I was not made aware of complaint of her " just try find another doctor". She was NEVER deemed as "making things up" or called "weird" by this provider. - If this is truly the way she feels she should seek another PCP. I will be happy to care for her during her physicals and other conditions if she desires at this time.     Of note: pt has switched BP medications multiple times, each time stating a side effect. She has allergies reported to the majority of BP medication meds or classes. She called numerous times to be switched off amlodipine for mouth lesions, and once she was switched off she felt metoprolol caused viral illness like symptoms, even though she endorsed have been exposed to a viral illness. She was asked to give it 2 weeks to get over virus before settling on the thought it was metoprolol causing the symptoms.

## 2016-05-06 NOTE — Telephone Encounter (Signed)
Message sent in MyChart.

## 2016-05-27 DIAGNOSIS — Z01419 Encounter for gynecological examination (general) (routine) without abnormal findings: Secondary | ICD-10-CM | POA: Diagnosis not present

## 2016-05-30 ENCOUNTER — Telehealth: Payer: Self-pay | Admitting: Family Medicine

## 2016-05-30 NOTE — Telephone Encounter (Signed)
[facility] [place]  Patient Name: [name]  DOB: 1955/10/10    Initial Comment Caller states that she broke her toe and she wants to know what she can do. It hurts and it is bruised and she knows it broke because she cannot bend it.    Nurse Assessment  Nurse: Melina Modena, RN, [place] Date/Time Eilene Ghazi Time): 05/30/2016 2:36:25 PM  Confirm and document reason for call. If symptomatic, describe symptoms. ---Caller states that she broke her toe, black and blue, bruise is further down on foot part, walks with a limp, happened yesterday. No other symptoms.  Does the patient have any new or worsening symptoms? ---Yes  Will a triage be completed? ---Yes  Related visit to physician within the last 2 weeks? ---No  Does the PT have any chronic conditions? (i.e. diabetes, asthma, etc.) ---No  Is this a behavioral health or substance abuse call? ---No     Guidelines    Guideline Title Affirmed Question Affirmed Notes  Toe Injury [1] Toe injury AND [2] bad limp or can't wear shoes/sandals    Final Disposition User   See Physician within 24 Hours [place], RN, [place]    Comments  going to wait until tomorrow to go to an urgent care, is babysitting today and cannot go today.   Referrals  GO TO FACILITY UNDECIDED   Disagree/Comply: Comply

## 2016-07-03 DIAGNOSIS — I129 Hypertensive chronic kidney disease with stage 1 through stage 4 chronic kidney disease, or unspecified chronic kidney disease: Secondary | ICD-10-CM | POA: Diagnosis not present

## 2016-07-03 DIAGNOSIS — N183 Chronic kidney disease, stage 3 (moderate): Secondary | ICD-10-CM | POA: Diagnosis not present

## 2016-07-04 ENCOUNTER — Other Ambulatory Visit: Payer: Self-pay | Admitting: Nephrology

## 2016-07-04 DIAGNOSIS — N183 Chronic kidney disease, stage 3 unspecified: Secondary | ICD-10-CM

## 2016-07-04 DIAGNOSIS — I129 Hypertensive chronic kidney disease with stage 1 through stage 4 chronic kidney disease, or unspecified chronic kidney disease: Secondary | ICD-10-CM

## 2016-07-11 ENCOUNTER — Ambulatory Visit
Admission: RE | Admit: 2016-07-11 | Discharge: 2016-07-11 | Disposition: A | Discharge status: Home / Self Care | Payer: Medicare Other | Source: Ambulatory Visit | Attending: Nephrology | Admitting: Nephrology

## 2016-07-11 DIAGNOSIS — N183 Chronic kidney disease, stage 3 unspecified: Secondary | ICD-10-CM

## 2016-07-11 DIAGNOSIS — I129 Hypertensive chronic kidney disease with stage 1 through stage 4 chronic kidney disease, or unspecified chronic kidney disease: Secondary | ICD-10-CM | POA: Diagnosis not present

## 2016-07-17 DIAGNOSIS — N183 Chronic kidney disease, stage 3 (moderate): Secondary | ICD-10-CM | POA: Diagnosis not present

## 2016-08-04 DIAGNOSIS — N183 Chronic kidney disease, stage 3 (moderate): Secondary | ICD-10-CM | POA: Diagnosis not present

## 2016-08-04 DIAGNOSIS — N281 Cyst of kidney, acquired: Secondary | ICD-10-CM | POA: Diagnosis not present

## 2016-08-04 DIAGNOSIS — I129 Hypertensive chronic kidney disease with stage 1 through stage 4 chronic kidney disease, or unspecified chronic kidney disease: Secondary | ICD-10-CM | POA: Diagnosis not present

## 2016-08-04 DIAGNOSIS — I951 Orthostatic hypotension: Secondary | ICD-10-CM | POA: Diagnosis not present

## 2016-08-06 ENCOUNTER — Telehealth: Payer: Self-pay | Admitting: Family Medicine

## 2016-08-06 NOTE — Telephone Encounter (Signed)
Please advise. Thanks.  

## 2016-08-06 NOTE — Telephone Encounter (Signed)
[name], CMA, called pt back to clarify a few things: pt does NOT have any pain today. Yesterday she had some LLQ abd pain and a diarrhea BM.  No fever, no n/v.  Appetite is down.  OK to see in office tomorrow.  Signed:  Crissie Sickles, MD           08/06/2016

## 2016-08-06 NOTE — Telephone Encounter (Signed)
OK to see in office 

## 2016-08-06 NOTE — Telephone Encounter (Signed)
Apt made for tomorrow (08/07/16 at 1:30pm).

## 2016-08-06 NOTE — Telephone Encounter (Signed)
°  Patient Name: [name]  DOB: [birth year]    Initial Comment caller states her blood pressure is low and she had been feeling dizzy. She also doesn't have an appetite.    Nurse Assessment  Nurse: Joline Salt, RN, Malachy Mood Date/Time Eilene Ghazi Time): 08/06/2016 2:33:32 PM  Confirm and document reason for call. If symptomatic, describe symptoms. ---Caller states her blood pressure is low and she had been feeling dizzy. She also doesn't have an appetite. States that her creatinine is elevated. BP 116/84 standing; Sitting 118/90 States she feels dizzy when she bends over and then stands up.  Does the patient have any new or worsening symptoms? ---Yes  Will a triage be completed? ---Yes  Related visit to physician within the last 2 weeks? ---Yes  Does the PT have any chronic conditions? (i.e. diabetes, asthma, etc.) ---No  Is this a behavioral health or substance abuse call? ---No     Guidelines    Guideline Title Affirmed Question Affirmed Notes  Low Blood Pressure Brief (now gone) dizziness or lightheadedness after standing up or eating    Final Disposition User   See PCP When Office is Open (within 3 days) Joline Salt, Therapist, sports, [employer]    Referrals  REFERRED TO PCP OFFICE   Disagree/Comply: Leta Baptist

## 2016-08-06 NOTE — Telephone Encounter (Signed)
FYI. I called pt and she stated that she told them she thinks her appendix is bad. She wanted to know if there was a test we can do to see if her appendix is bad and causing her to feel bad. She stated that she sees Dr. Lorrene Reid for her BP due to kidney failure.

## 2016-08-07 ENCOUNTER — Encounter: Payer: Self-pay | Admitting: Family Medicine

## 2016-08-07 ENCOUNTER — Ambulatory Visit: Payer: Medicare Other | Admitting: Family Medicine

## 2016-08-07 ENCOUNTER — Ambulatory Visit (HOSPITAL_COMMUNITY)
Admission: RE | Admit: 2016-08-07 | Discharge: 2016-08-07 | Disposition: A | Discharge status: Home / Self Care | Payer: Medicare Other | Source: Ambulatory Visit | Attending: Family Medicine | Admitting: Family Medicine

## 2016-08-07 ENCOUNTER — Ambulatory Visit (INDEPENDENT_AMBULATORY_CARE_PROVIDER_SITE_OTHER): Payer: Medicare Other | Admitting: Family Medicine

## 2016-08-07 VITALS — BP 151/69 | HR 61 | Temp 98.1°F | Resp 16 | Wt 126.0 lb

## 2016-08-07 DIAGNOSIS — D179 Benign lipomatous neoplasm, unspecified: Secondary | ICD-10-CM | POA: Diagnosis not present

## 2016-08-07 DIAGNOSIS — R140 Abdominal distension (gaseous): Secondary | ICD-10-CM | POA: Insufficient documentation

## 2016-08-07 DIAGNOSIS — L821 Other seborrheic keratosis: Secondary | ICD-10-CM | POA: Diagnosis not present

## 2016-08-07 DIAGNOSIS — R8290 Unspecified abnormal findings in urine: Secondary | ICD-10-CM | POA: Diagnosis not present

## 2016-08-07 DIAGNOSIS — R195 Other fecal abnormalities: Secondary | ICD-10-CM | POA: Diagnosis not present

## 2016-08-07 DIAGNOSIS — L814 Other melanin hyperpigmentation: Secondary | ICD-10-CM | POA: Diagnosis not present

## 2016-08-07 DIAGNOSIS — R109 Unspecified abdominal pain: Secondary | ICD-10-CM

## 2016-08-07 DIAGNOSIS — D225 Melanocytic nevi of trunk: Secondary | ICD-10-CM | POA: Diagnosis not present

## 2016-08-07 DIAGNOSIS — D1801 Hemangioma of skin and subcutaneous tissue: Secondary | ICD-10-CM | POA: Diagnosis not present

## 2016-08-07 LAB — CBC WITH DIFFERENTIAL/PLATELET
BASOS PCT: 1 % (ref 0.0–3.0)
Basophils Absolute: 0 10*3/uL (ref 0.0–0.1)
EOS PCT: 2 % (ref 0.0–5.0)
Eosinophils Absolute: 0.1 10*3/uL (ref 0.0–0.7)
HEMATOCRIT: 40.4 % (ref 36.0–46.0)
HEMOGLOBIN: 13.4 g/dL (ref 12.0–15.0)
LYMPHS PCT: 30.4 % (ref 12.0–46.0)
Lymphs Abs: 1.3 10*3/uL (ref 0.7–4.0)
MCHC: 33.2 g/dL (ref 30.0–36.0)
MCV: 90.6 fl (ref 78.0–100.0)
MONO ABS: 0.3 10*3/uL (ref 0.1–1.0)
Monocytes Relative: 6.7 % (ref 3.0–12.0)
Neutro Abs: 2.5 10*3/uL (ref 1.4–7.7)
Neutrophils Relative %: 59.9 % (ref 43.0–77.0)
Platelets: 163 10*3/uL (ref 150.0–400.0)
RBC: 4.46 Mil/uL (ref 3.87–5.11)
RDW: 12.9 % (ref 11.5–15.5)
WBC: 4.2 10*3/uL (ref 4.0–10.5)

## 2016-08-07 LAB — POCT URINALYSIS DIPSTICK
Blood, UA: NEGATIVE
GLUCOSE UA: NEGATIVE
Ketones, UA: NEGATIVE
Nitrite, UA: NEGATIVE
SPEC GRAV UA: 1.02 (ref 1.010–1.025)
UROBILINOGEN UA: 1 U/dL
pH, UA: 7.5 (ref 5.0–8.0)

## 2016-08-07 LAB — COMPREHENSIVE METABOLIC PANEL
ALBUMIN: 4.2 g/dL (ref 3.5–5.2)
ALT: 13 U/L (ref 0–35)
AST: 20 U/L (ref 0–37)
Alkaline Phosphatase: 80 U/L (ref 39–117)
BILIRUBIN TOTAL: 0.5 mg/dL (ref 0.2–1.2)
BUN: 16 mg/dL (ref 6–23)
CALCIUM: 9.5 mg/dL (ref 8.4–10.5)
CHLORIDE: 105 meq/L (ref 96–112)
CO2: 32 meq/L (ref 19–32)
CREATININE: 1.17 mg/dL (ref 0.40–1.20)
GFR: 49.82 mL/min — ABNORMAL LOW (ref >60.00)
GLUCOSE: 90 mg/dL (ref 70–99)
POTASSIUM: 4.2 meq/L (ref 3.5–5.1)
Sodium: 142 mEq/L (ref 135–145)
Total Protein: 6.4 g/dL (ref 6.0–8.3)

## 2016-08-07 LAB — LIPASE: LIPASE: 45 U/L (ref 11.0–59.0)

## 2016-08-07 MED ORDER — HYOSCYAMINE SULFATE 0.125 MG SL SUBL: SUBLINGUAL_TABLET | SUBLINGUAL | 0 refills | Status: DC

## 2016-08-07 NOTE — Progress Notes (Signed)
OFFICE VISIT  08/07/2016   CC:  Chief Complaint  Patient presents with  . Abdominal Pain    thinks its her appendix   HPI:    Patient is a 61 y.o. Caucasian female who presents for hurting in abdomen that is off and on for about a month. Sometimes the pain is severe, it usually is in lower abd diffusely, occ more in both sides and not in abdomen. She does have some occ postprandial diarrhea and had this about 4 d ago but doesn't recall eating anything abnl for her on this occasion.  No n/v. Appetite up and down lately.  She gets hot/clammy when the severe pain occurs, occ it is very intense and causes her to double over briefly.  She had a normal BM yesterday. Hard to discern from questioning pt today whether she has been experiencing constipation or not.  No blood or melena.  No fever.  No abd distention, no excessive gas.  No dysuria, urinary urgency, or NEW/WORSENING urinary frequency.    She presented to her GYN MD when this started and he examined her and told her all was normal.   She does not drink any alcohol, does not smoke, takes no drugs. She takes 1 aleve daily for about the last month---this stops her abd pain.  Some low bps occ lately, but also some still mildly elevated.  Working on bp med titration with her nephrologist---pt states she is NOT taking toprol xl listed below, but is taking amlodipine 5mg  daily.  Past Medical History:  Diagnosis Date  . Allergy   . Anxiety   . Arthritis   . Chronic kidney disease    kidney donor, has only one kidney  . Hypertension   . 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 Migraines   . Thyroid disease    Hx of   . Wears dentures    top  . Wears glasses     Past Surgical History:  Procedure Laterality Date  . ABDOMINAL HYSTERECTOMY  1991   partial  . BLADDER SURGERY     bladder tac, then bladder tack reversal due to adhesions.  . DORSAL COMPARTMENT RELEASE Right 02/07/2013   Procedure: RIGHT WRIST DEQUERVAIN  RELEASE;  Surgeon: Jolyn Nap, MD;  Location: [place];  Service: Orthopedics;  Laterality: Right;  . KIDNEY DONATION  2008   gave her lt kidney to daughter  . NECK MASS EXCISION     rt-mass  . NECK SURGERY  1990   rt ? parotidectomy  . SHOULDER ARTHROSCOPY WITH OPEN ROTATOR CUFF REPAIR AND DISTAL CLAVICLE ACROMINECTOMY Right 04/25/2014   Procedure: SHOULDER ARTHROSCOPY WITH MINI-OPEN ROTATOR CUFF REPAIR AND DISTAL CLAVICLE RESECTION;  Surgeon: Garald Balding, MD;  Location: [place];  Service: Orthopedics;  Laterality: Right;  . SHOULDER CLOSED REDUCTION Right 08/31/2014   Procedure: CLOSED MANIPULATION SHOULDER;  Surgeon: Garald Balding, MD;  Location: [place];  Service: Orthopedics;  Laterality: Right;  . SUBACROMIAL DECOMPRESSION Right 04/25/2014   Procedure: SUBACROMIAL DECOMPRESSION;  Surgeon: Garald Balding, MD;  Location: [place];  Service: Orthopedics;  Laterality: Right;  . THYROID SURGERY     bx    Outpatient Medications Prior to Visit  Medication Sig Dispense Refill  . fexofenadine (ALLEGRA) 30 MG tablet Take 30 mg by mouth daily as needed.     Marland Kitchen azelastine (ASTELIN) 0.1 % nasal spray Place 1 spray into  both nostrils daily. Use in each nostril as directed    . Cholecalciferol (VITAMIN D3) 1000 units CAPS Take by mouth.    . fluticasone (FLONASE) 50 MCG/ACT nasal spray Place 2 sprays into both nostrils daily. (Patient not taking: Reported on 08/07/2016) 16 g 6  . metoprolol succinate (TOPROL-XL) 25 MG 24 hr tablet Take 1 tablet (25 mg total) by mouth daily. (Patient not taking: Reported on 08/07/2016) 30 tablet 0  . vitamin B-12 (CYANOCOBALAMIN) 1000 MCG tablet 1000 mcg daily for 7 days and then 1000 mcg weekly (Patient not taking: Reported on 08/07/2016) 18 tablet 0   No facility-administered medications prior to visit.     Allergies  Allergen Reactions  . Ace Inhibitors Cough  . Celebrex  [Celecoxib] Hives  . Dilaudid [Hydromorphone] Nausea And Vomiting    Severe N/V  . Lactose Intolerance (Gi) Diarrhea    cramping  . Sudafed [Pseudoephedrine Hcl]     Makes "high"  . Losartan Palpitations  . Mucinex [Guaifenesin Er] Palpitations  . Sulfonamide Derivatives Rash    ROS As per HPI  PE: Blood pressure (!) 151/69, pulse 61, temperature 98.1 F (36.7 C), temperature source Oral, resp. rate 16, weight 126 lb (57.2 kg), SpO2 98 %.  Pt examined with Starla Link, CMA, as chaperone.  Gen: Alert, well appearing.  Patient is oriented to person, place, time, and situation. AFFECT: pleasant, lucid thought and speech. ZHY:QMVH: no injection, icteris, swelling, or exudate.  EOMI, PERRLA. Mouth: lips without lesion/swelling.  Oral mucosa pink and moist. Oropharynx without erythema, exudate, or swelling.  CV: RRR, no m/r/g.   LUNGS: CTA bilat, nonlabored resps, good aeration in all lung fields. ABD: soft, non-distended.  BS normal.  No mass or HSM.  Her abd aorta pulsations are palpable but there is no bruit and no tenderness over the pulsation.  She has moderate TTP in lower abd diffusely, without guarding or rebound tenderness.  With contraction of abd muscles while I palpate lower abd, her abd tenderness goes away.  LABS:   CC UA today: trace LEU and trace PRO, otherwise normal.  Lab Results  Component Value Date   WBC 6.2 02/11/2016   HGB 14.4 02/11/2016   HCT 41.6 02/11/2016   MCV 87.8 02/11/2016   PLT 209.0 02/11/2016     Chemistry      Component Value Date/Time   NA 142 02/11/2016 1344   K 4.3 02/11/2016 1344   CL 106 02/11/2016 1344   CO2 30 02/11/2016 1344   BUN 24 (H) 02/11/2016 1344   CREATININE 1.16 02/11/2016 1344   CREATININE 1.22 (H) 11/06/2015 1001      Component Value Date/Time   CALCIUM 9.7 02/11/2016 1344   ALKPHOS 94 06/19/2015 0821   AST 22 06/19/2015 0821   ALT 15 06/19/2015 0821   BILITOT 0.4 06/19/2015 0821     GFR 02/11/16= 50 ml/min  Lab  Results  Component Value Date   TSH 1.09 10/02/2014   IMPRESSION AND PLAN:  1) Recurrent abd pain: no red flags for significant intra-abdominal pathology on history of physical exam today. She may be having some colonic spasm.   Possibly constipation/large stool burden. Plan: check KUB.  Draw blood for CBC w/diff, CMET, lipase. Send urine for c/s since her UA is mildly abnl today, but with no classic UTI sx's--no abx started today. Trial of levsin 0.125, 1-2 qid prn.  Therapeutic expectations and side effect profile of medication discussed today.  Patient's questions answered. Would rather her  stop her aleve 1 tab per day due to her CRI.  An After Visit Summary was printed and given to the patient.  FOLLOW UP: Return in about 2 weeks (around 08/21/2016) for with Dr. Nelda Severe abd pain.  Signed:  Crissie Sickles, MD           08/07/2016

## 2016-08-08 LAB — URINE CULTURE: ORGANISM ID, BACTERIA: NO GROWTH

## 2016-08-21 ENCOUNTER — Ambulatory Visit: Payer: Medicare Other | Admitting: Family Medicine

## 2016-08-23 IMAGING — MR MR SHOULDER*R* W/O CM
4 of 5 series · 29 of 40 positions shown · non-contrast
Comparison: 07/31/2005

CLINICAL DATA: Chronic right shoulder pain for over a year. Limited
range of motion.

EXAM:
MRI OF THE RIGHT SHOULDER WITHOUT CONTRAST
TECHNIQUE: Multiplanar, multisequence MR imaging of the shoulder was performed.
No intravenous contrast was administered.

[Series 3: T2 fat-sat · axial · 4.0mm · 0.25mm/px · z∈[-15,+73]mm · 8 of 20 slices shown (1 of 3)]
[im 1/20]
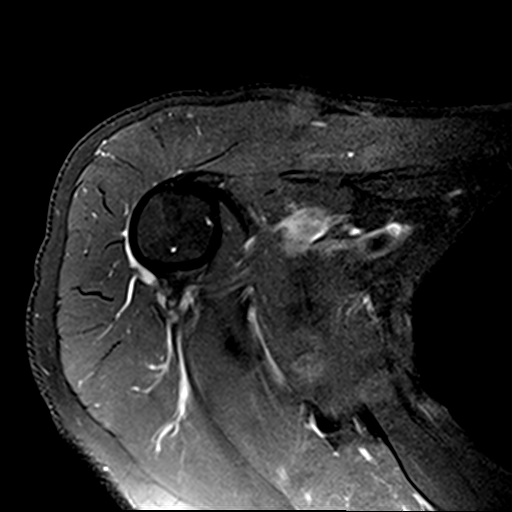
[im 3/20]
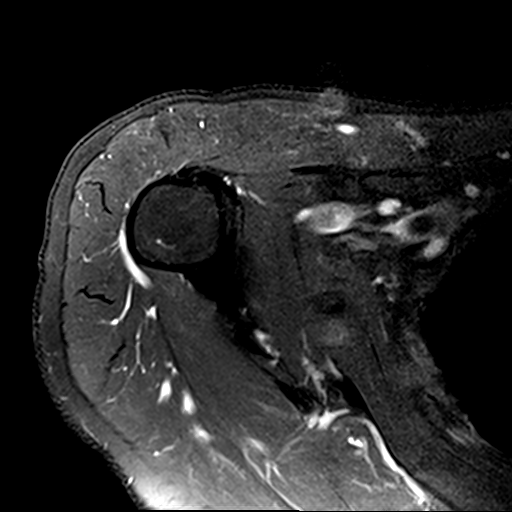
[im 7/20]
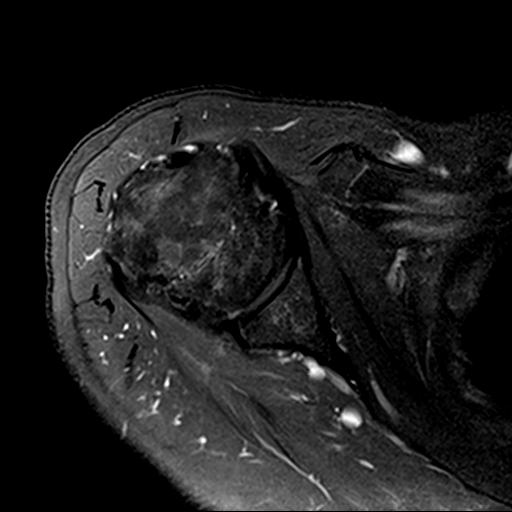
[im 9/20]
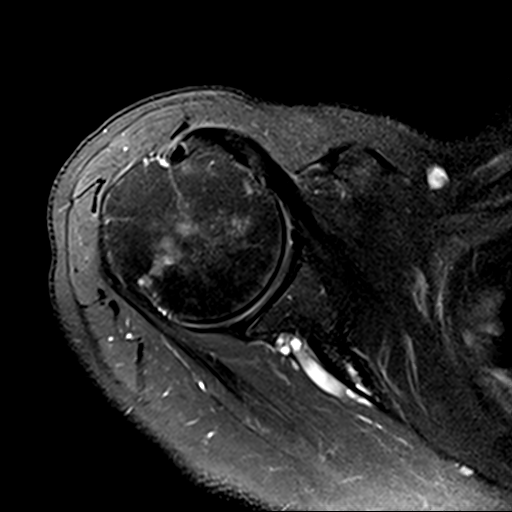
[im 11/20]
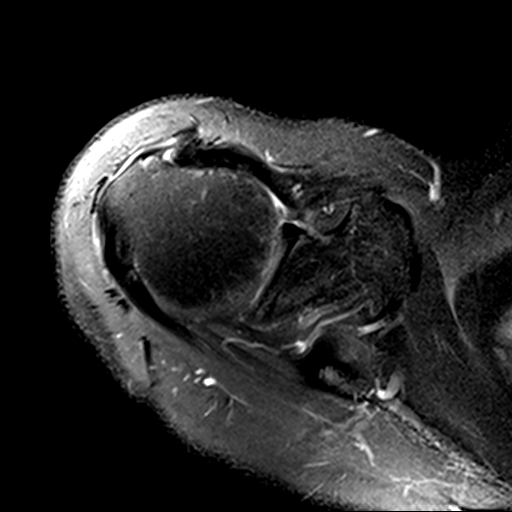
[im 13/20]
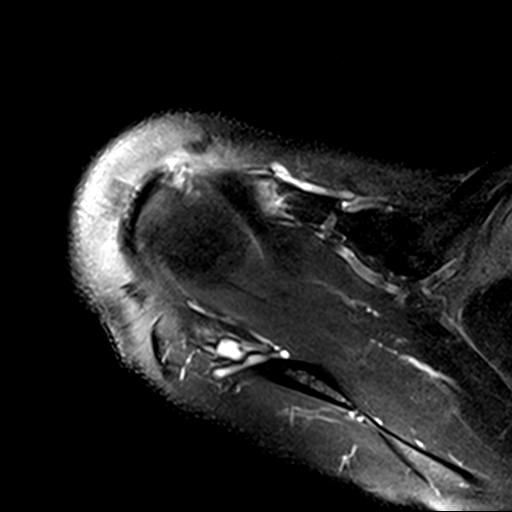
[im 17/20]
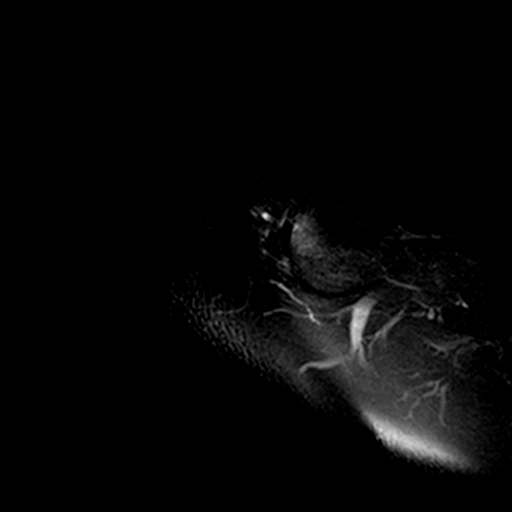
[im 20/20]
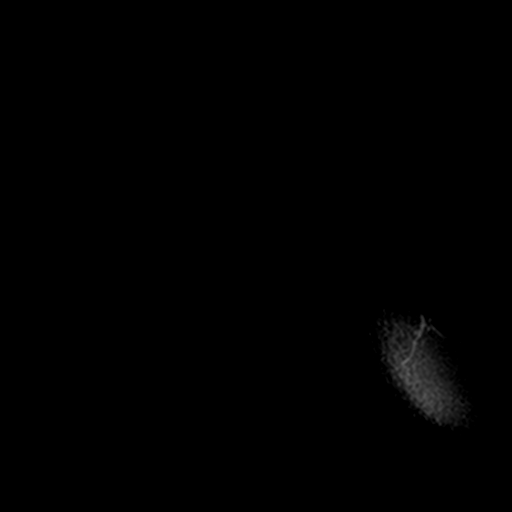

[Series 4: T2 fat-sat · oblique · 4.0mm · 0.55mm/px · 8 of 17 slices shown (2 of 3)]
[im 1/17]
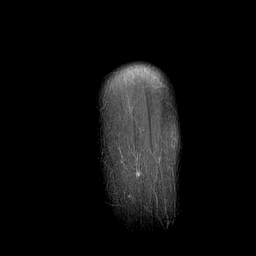
[im 3/17]
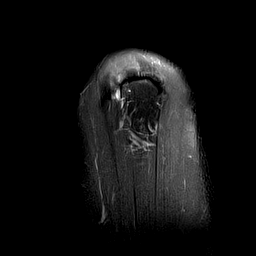
[im 5/17]
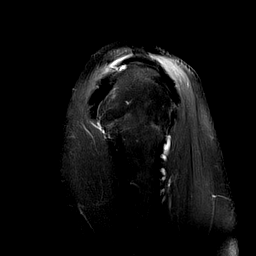
[im 7/17]
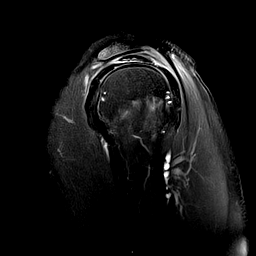
[im 10/17]
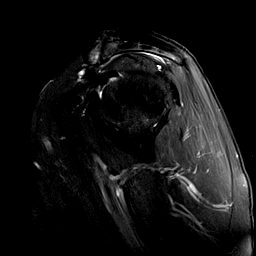
[im 12/17]
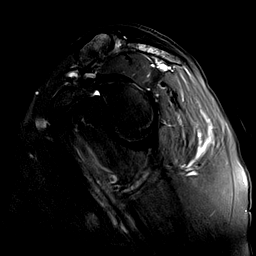
[im 14/17]
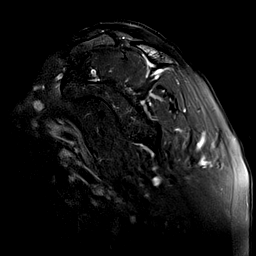
[im 17/17]
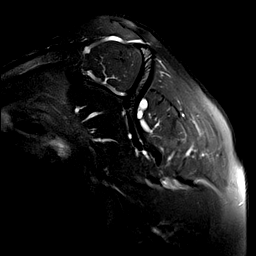

[Series 6: T2 fat-sat · oblique · 4.0mm · 0.55mm/px · 6 of 15 slices shown (3 of 3)]
[im 1/15]
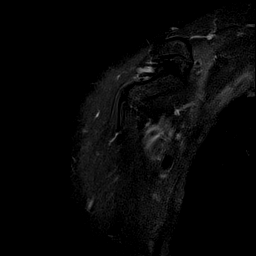
[im 3/15]
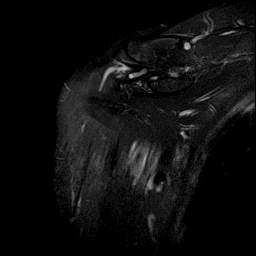
[im 5/15]
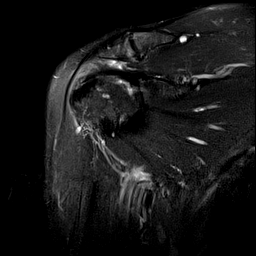
[im 8/15]
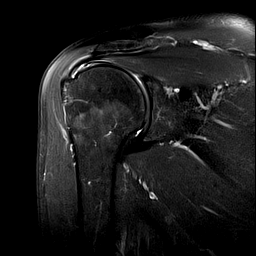
[im 10/15]
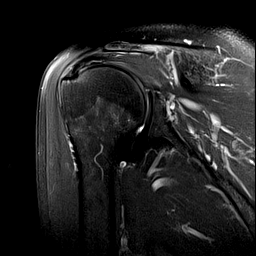
[im 12/15]
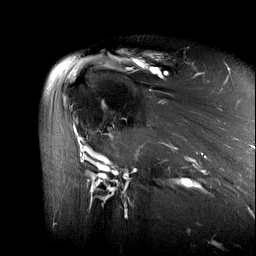

[Series 7: PD · oblique · 4.0mm · 0.27mm/px · 7 of 15 slices shown]
[im 1/15]
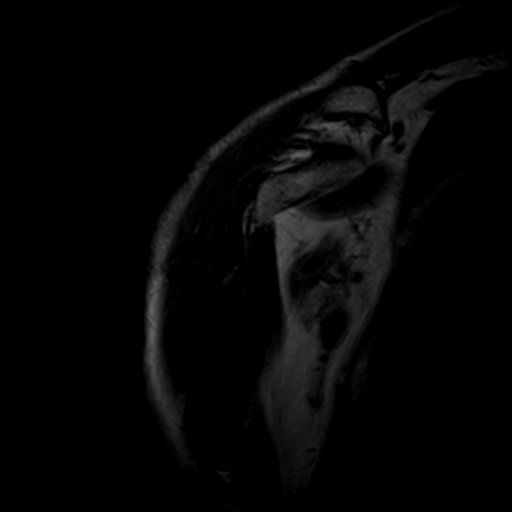
[im 3/15]
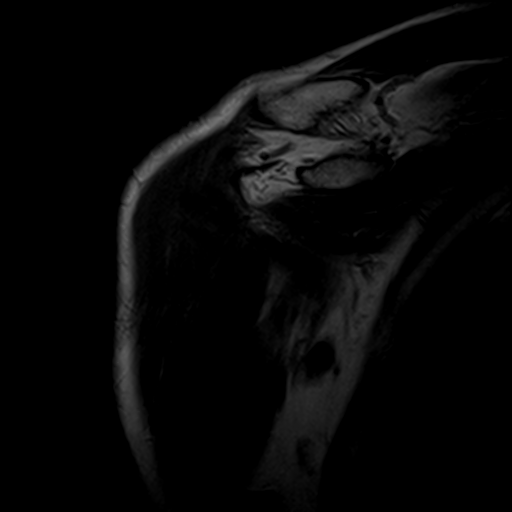
[im 5/15]
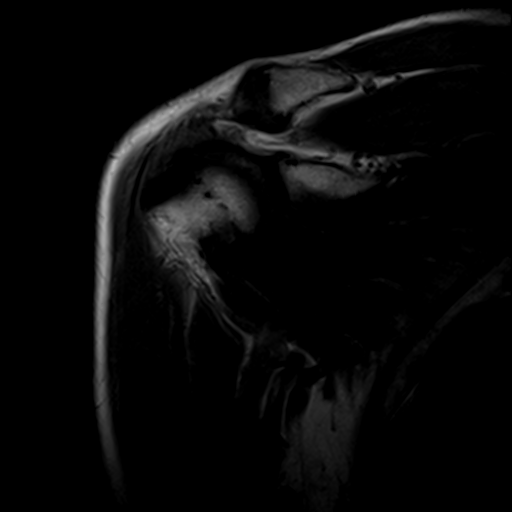
[im 8/15]
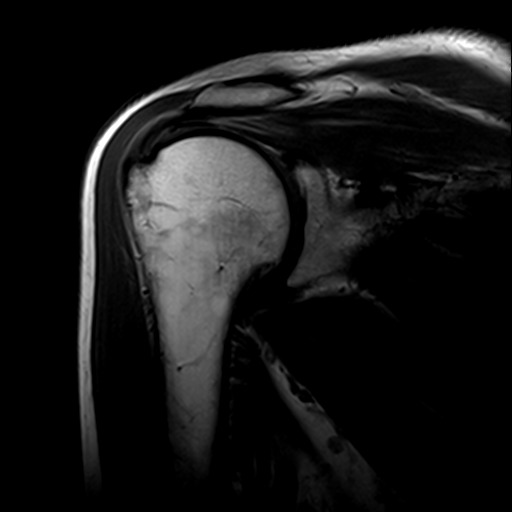
[im 10/15]
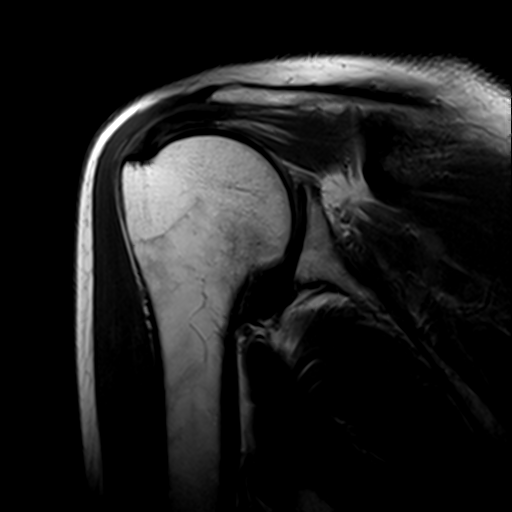
[im 12/15]
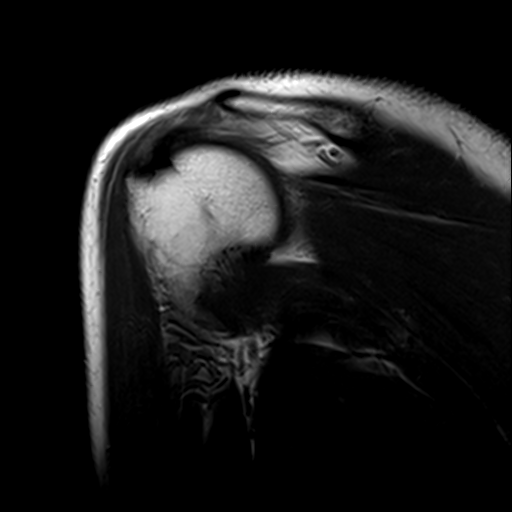
[im 15/15]
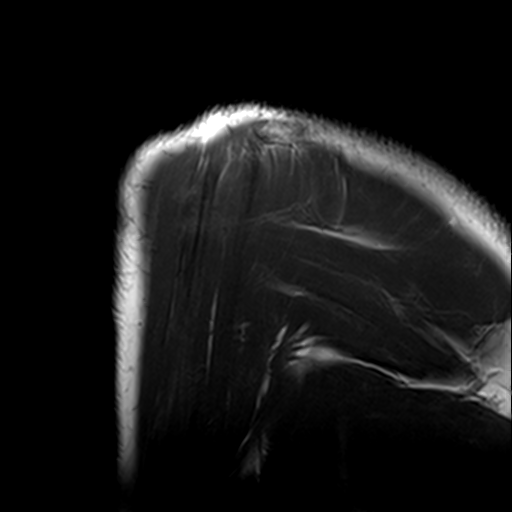

[29 of 40 positions shown; findings below may reference images not displayed]

FINDINGS: Rotator cuff: Obliquely oriented fissuring in the supraspinatus
tendon extends longitudinally from the articular surface and
probably reaches the bursal surface on images 7 through 10 of series
6 and images 7-4 of series 4. Accordingly this is probably a
full-thickness partial width tear. No tendon retraction.

Muscles:  Unremarkable

Biceps long head:  Unremarkable

Acromioclavicular Joint: Mild degenerative AC joint arthropathy.
Small amount of fluid in the subacromial subdeltoid bursa. The
acromial undersurface is type 3 (hooked).

Glenohumeral Joint: Unremarkable

Labrum:  Grossly unremarkable

Bones:  Otherwise unremarkable.
IMPRESSION: 1. Obliquely oriented full-thickness partial width tear of the
distal supraspinatus tendon, with associated small amount of fluid
in the subacromial subdeltoid bursa.
2. Mild degenerative AC joint arthropathy with unfavorable
subacromial morphology.

## 2016-09-15 DIAGNOSIS — N281 Cyst of kidney, acquired: Secondary | ICD-10-CM | POA: Diagnosis not present

## 2016-09-15 DIAGNOSIS — Z23 Encounter for immunization: Secondary | ICD-10-CM | POA: Diagnosis not present

## 2016-09-15 DIAGNOSIS — N183 Chronic kidney disease, stage 3 (moderate): Secondary | ICD-10-CM | POA: Diagnosis not present

## 2016-09-15 DIAGNOSIS — I129 Hypertensive chronic kidney disease with stage 1 through stage 4 chronic kidney disease, or unspecified chronic kidney disease: Secondary | ICD-10-CM | POA: Diagnosis not present

## 2016-09-30 DIAGNOSIS — J329 Chronic sinusitis, unspecified: Secondary | ICD-10-CM | POA: Diagnosis not present

## 2016-09-30 DIAGNOSIS — N189 Chronic kidney disease, unspecified: Secondary | ICD-10-CM | POA: Diagnosis not present

## 2016-10-07 DIAGNOSIS — H5203 Hypermetropia, bilateral: Secondary | ICD-10-CM | POA: Diagnosis not present

## 2016-10-07 DIAGNOSIS — H52223 Regular astigmatism, bilateral: Secondary | ICD-10-CM | POA: Diagnosis not present

## 2016-10-07 DIAGNOSIS — D3140 Benign neoplasm of unspecified ciliary body: Secondary | ICD-10-CM | POA: Diagnosis not present

## 2016-10-07 DIAGNOSIS — H524 Presbyopia: Secondary | ICD-10-CM | POA: Diagnosis not present

## 2016-10-14 DIAGNOSIS — Z6821 Body mass index (BMI) 21.0-21.9, adult: Secondary | ICD-10-CM | POA: Diagnosis not present

## 2016-10-14 DIAGNOSIS — J019 Acute sinusitis, unspecified: Secondary | ICD-10-CM | POA: Diagnosis not present

## 2016-10-14 DIAGNOSIS — Z720 Tobacco use: Secondary | ICD-10-CM | POA: Diagnosis not present

## 2016-10-30 DIAGNOSIS — J342 Deviated nasal septum: Secondary | ICD-10-CM | POA: Diagnosis not present

## 2016-11-03 DIAGNOSIS — M545 Low back pain: Secondary | ICD-10-CM | POA: Diagnosis not present

## 2016-11-03 DIAGNOSIS — K5900 Constipation, unspecified: Secondary | ICD-10-CM | POA: Diagnosis not present

## 2016-11-07 DIAGNOSIS — N281 Cyst of kidney, acquired: Secondary | ICD-10-CM | POA: Diagnosis not present

## 2016-11-07 DIAGNOSIS — K5900 Constipation, unspecified: Secondary | ICD-10-CM | POA: Diagnosis not present

## 2016-11-07 DIAGNOSIS — R109 Unspecified abdominal pain: Secondary | ICD-10-CM | POA: Diagnosis not present

## 2016-11-26 DIAGNOSIS — M059 Rheumatoid arthritis with rheumatoid factor, unspecified: Secondary | ICD-10-CM | POA: Diagnosis not present

## 2017-01-21 DIAGNOSIS — Z6821 Body mass index (BMI) 21.0-21.9, adult: Secondary | ICD-10-CM | POA: Diagnosis not present

## 2017-01-21 DIAGNOSIS — J019 Acute sinusitis, unspecified: Secondary | ICD-10-CM | POA: Diagnosis not present

## 2017-02-24 DIAGNOSIS — M25512 Pain in left shoulder: Secondary | ICD-10-CM | POA: Diagnosis not present

## 2017-03-04 ENCOUNTER — Other Ambulatory Visit: Payer: Self-pay | Admitting: Nephrology

## 2017-03-04 DIAGNOSIS — N281 Cyst of kidney, acquired: Secondary | ICD-10-CM

## 2017-03-09 ENCOUNTER — Ambulatory Visit
Admission: RE | Admit: 2017-03-09 | Discharge: 2017-03-09 | Disposition: A | Discharge status: Home / Self Care | Payer: Medicare Other | Source: Ambulatory Visit | Attending: Nephrology | Admitting: Nephrology

## 2017-03-09 DIAGNOSIS — N281 Cyst of kidney, acquired: Secondary | ICD-10-CM | POA: Diagnosis not present

## 2017-03-12 ENCOUNTER — Emergency Department (HOSPITAL_COMMUNITY)
Admission: EM | Admit: 2017-03-12 | Discharge: 2017-03-12 | Disposition: A | Discharge status: Home / Self Care | Payer: Medicare Other | Attending: Emergency Medicine | Admitting: Emergency Medicine

## 2017-03-12 ENCOUNTER — Encounter (HOSPITAL_COMMUNITY): Payer: Self-pay | Admitting: Emergency Medicine

## 2017-03-12 ENCOUNTER — Other Ambulatory Visit: Payer: Self-pay

## 2017-03-12 DIAGNOSIS — R109 Unspecified abdominal pain: Secondary | ICD-10-CM | POA: Insufficient documentation

## 2017-03-12 DIAGNOSIS — Z5321 Procedure and treatment not carried out due to patient leaving prior to being seen by health care provider: Secondary | ICD-10-CM | POA: Diagnosis not present

## 2017-03-12 LAB — COMPREHENSIVE METABOLIC PANEL
ALBUMIN: 4.1 g/dL (ref 3.5–5.0)
ALT: 17 U/L (ref 14–54)
AST: 25 U/L (ref 15–41)
Alkaline Phosphatase: 90 U/L (ref 38–126)
Anion gap: 11 (ref 5–15)
BILIRUBIN TOTAL: 0.6 mg/dL (ref 0.3–1.2)
BUN: 22 mg/dL — AB (ref 6–20)
CO2: 27 mmol/L (ref 22–32)
CREATININE: 1.26 mg/dL — AB (ref 0.44–1.00)
Calcium: 10 mg/dL (ref 8.9–10.3)
Chloride: 106 mmol/L (ref 101–111)
GFR calc Af Amer: 52 mL/min — ABNORMAL LOW (ref >60)
GFR calc non Af Amer: 45 mL/min — ABNORMAL LOW (ref >60)
GLUCOSE: 102 mg/dL — AB (ref 65–99)
Potassium: 4.6 mmol/L (ref 3.5–5.1)
Sodium: 144 mmol/L (ref 135–145)
TOTAL PROTEIN: 7.3 g/dL (ref 6.5–8.1)

## 2017-03-12 LAB — CBC
HCT: 41.8 % (ref 36.0–46.0)
Hemoglobin: 13.7 g/dL (ref 12.0–15.0)
MCH: 29.6 pg (ref 26.0–34.0)
MCHC: 32.8 g/dL (ref 30.0–36.0)
MCV: 90.3 fL (ref 78.0–100.0)
Platelets: 197 10*3/uL (ref 150–400)
RBC: 4.63 MIL/uL (ref 3.87–5.11)
RDW: 12 % (ref 11.5–15.5)
WBC: 4.3 10*3/uL (ref 4.0–10.5)

## 2017-03-12 LAB — LIPASE, BLOOD: Lipase: 56 U/L — ABNORMAL HIGH (ref 11–51)

## 2017-03-12 NOTE — ED Triage Notes (Addendum)
Patient c/o left abd pain x2 weeks. Denies any nausea, vomiting, diarrhea, or fevers. Patient states last normal BM was yesterday, denies any blood. Patient also c/o upper back pain x2 weeks in which see saw PCP and told "muscle was inflamed."  Patient denies any improvement in pain.

## 2017-03-13 ENCOUNTER — Ambulatory Visit (HOSPITAL_COMMUNITY)
Admission: RE | Admit: 2017-03-13 | Discharge: 2017-03-13 | Disposition: A | Discharge status: Home / Self Care | Payer: Medicare Other | Source: Ambulatory Visit | Attending: Internal Medicine | Admitting: Internal Medicine

## 2017-03-13 ENCOUNTER — Other Ambulatory Visit (HOSPITAL_COMMUNITY): Payer: Self-pay | Admitting: Internal Medicine

## 2017-03-13 DIAGNOSIS — N281 Cyst of kidney, acquired: Secondary | ICD-10-CM | POA: Insufficient documentation

## 2017-03-13 DIAGNOSIS — I700 Atherosclerosis of aorta: Secondary | ICD-10-CM | POA: Diagnosis not present

## 2017-03-13 DIAGNOSIS — R112 Nausea with vomiting, unspecified: Secondary | ICD-10-CM | POA: Diagnosis not present

## 2017-03-13 DIAGNOSIS — Z905 Acquired absence of kidney: Secondary | ICD-10-CM | POA: Insufficient documentation

## 2017-03-13 DIAGNOSIS — R748 Abnormal levels of other serum enzymes: Secondary | ICD-10-CM | POA: Diagnosis not present

## 2017-03-13 DIAGNOSIS — R1012 Left upper quadrant pain: Secondary | ICD-10-CM | POA: Diagnosis not present

## 2017-03-13 DIAGNOSIS — R7989 Other specified abnormal findings of blood chemistry: Secondary | ICD-10-CM | POA: Diagnosis not present

## 2017-03-13 DIAGNOSIS — R1032 Left lower quadrant pain: Secondary | ICD-10-CM | POA: Diagnosis not present

## 2017-03-23 ENCOUNTER — Encounter: Payer: Self-pay | Admitting: Internal Medicine

## 2017-04-07 DIAGNOSIS — N189 Chronic kidney disease, unspecified: Secondary | ICD-10-CM | POA: Diagnosis not present

## 2017-04-07 DIAGNOSIS — M25512 Pain in left shoulder: Secondary | ICD-10-CM | POA: Diagnosis not present

## 2017-04-07 DIAGNOSIS — J019 Acute sinusitis, unspecified: Secondary | ICD-10-CM | POA: Diagnosis not present

## 2017-04-07 DIAGNOSIS — R109 Unspecified abdominal pain: Secondary | ICD-10-CM | POA: Diagnosis not present

## 2017-04-07 DIAGNOSIS — R0602 Shortness of breath: Secondary | ICD-10-CM | POA: Diagnosis not present

## 2017-04-07 DIAGNOSIS — R110 Nausea: Secondary | ICD-10-CM | POA: Diagnosis not present

## 2017-04-07 DIAGNOSIS — H5710 Ocular pain, unspecified eye: Secondary | ICD-10-CM | POA: Diagnosis not present

## 2017-04-07 DIAGNOSIS — H9202 Otalgia, left ear: Secondary | ICD-10-CM | POA: Diagnosis not present

## 2017-04-07 DIAGNOSIS — R5383 Other fatigue: Secondary | ICD-10-CM | POA: Diagnosis not present

## 2017-04-09 DIAGNOSIS — H9202 Otalgia, left ear: Secondary | ICD-10-CM | POA: Diagnosis not present

## 2017-04-09 DIAGNOSIS — J029 Acute pharyngitis, unspecified: Secondary | ICD-10-CM | POA: Diagnosis not present

## 2017-04-25 DIAGNOSIS — N390 Urinary tract infection, site not specified: Secondary | ICD-10-CM | POA: Diagnosis not present

## 2017-04-26 DIAGNOSIS — T3695XA Adverse effect of unspecified systemic antibiotic, initial encounter: Secondary | ICD-10-CM | POA: Diagnosis not present

## 2017-04-26 DIAGNOSIS — N390 Urinary tract infection, site not specified: Secondary | ICD-10-CM | POA: Diagnosis not present

## 2017-04-30 DIAGNOSIS — N183 Chronic kidney disease, stage 3 (moderate): Secondary | ICD-10-CM | POA: Diagnosis not present

## 2017-04-30 DIAGNOSIS — I129 Hypertensive chronic kidney disease with stage 1 through stage 4 chronic kidney disease, or unspecified chronic kidney disease: Secondary | ICD-10-CM | POA: Diagnosis not present

## 2017-04-30 DIAGNOSIS — N281 Cyst of kidney, acquired: Secondary | ICD-10-CM | POA: Diagnosis not present

## 2017-05-21 ENCOUNTER — Ambulatory Visit: Payer: Medicare Other | Admitting: Nurse Practitioner

## 2017-06-11 ENCOUNTER — Encounter: Payer: Self-pay | Admitting: Orthopaedic Surgery

## 2017-06-11 ENCOUNTER — Ambulatory Visit: Payer: Medicare Other | Admitting: Orthopaedic Surgery

## 2017-06-11 VITALS — BP 133/80 | HR 62 | Temp 97.3°F | Ht 67.0 in | Wt 137.0 lb

## 2017-06-11 DIAGNOSIS — M654 Radial styloid tenosynovitis [de Quervain]: Secondary | ICD-10-CM

## 2017-06-11 NOTE — Progress Notes (Signed)
Subjective:    Patient ID: [name], female    DOB: 05/13/1955, 62 y.o.   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  She had surgery on the right first compartment by Dr. Milly Jakob on 02-07-13 for the [facility].  She did well.   Over the last three months she has had pain of the first compartment, pain with lifting.  She says it is like it was before her surgery.  She cannot take NSAIDs because of kidney problems. She has used rubs and ice to the area with just slight relief. She denies any trauma, any redness, any trauma.   Review of Systems  Respiratory: Negative for cough and shortness of breath.   Cardiovascular: Negative for chest pain and leg swelling.  Endocrine: Negative for cold intolerance.  Musculoskeletal: Positive for arthralgias.  Allergic/Immunologic: Positive for environmental allergies.  Neurological: Positive for headaches.  Psychiatric/Behavioral: The patient is nervous/anxious.   All other systems reviewed and are negative.  Past Medical History:  Diagnosis Date  . Allergy   . Anxiety   . Arthritis   . Chronic kidney disease    kidney donor, has only one kidney  . Hypertension   . 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 Migraines   . Thyroid disease    Hx of   . Wears dentures    top  . Wears glasses     Past Surgical History:  Procedure Laterality Date  . ABDOMINAL HYSTERECTOMY  1991   partial  . BLADDER SURGERY     bladder tac, then bladder tack reversal due to adhesions.  . DORSAL COMPARTMENT RELEASE Right 02/07/2013   Procedure: RIGHT WRIST DEQUERVAIN RELEASE;  Surgeon: Jolyn Nap, MD;  Location: [place];  Service: Orthopedics;  Laterality: Right;  . KIDNEY DONATION  2008   gave her lt kidney to daughter  . NECK MASS EXCISION     rt-mass  . NECK SURGERY  1990   rt ? parotidectomy  . SHOULDER ARTHROSCOPY WITH OPEN ROTATOR CUFF REPAIR AND DISTAL CLAVICLE  ACROMINECTOMY Right 04/25/2014   Procedure: SHOULDER ARTHROSCOPY WITH MINI-OPEN ROTATOR CUFF REPAIR AND DISTAL CLAVICLE RESECTION;  Surgeon: Garald Balding, MD;  Location: [place];  Service: Orthopedics;  Laterality: Right;  . SHOULDER CLOSED REDUCTION Right 08/31/2014   Procedure: CLOSED MANIPULATION SHOULDER;  Surgeon: Garald Balding, MD;  Location: [place];  Service: Orthopedics;  Laterality: Right;  . SUBACROMIAL DECOMPRESSION Right 04/25/2014   Procedure: SUBACROMIAL DECOMPRESSION;  Surgeon: Garald Balding, MD;  Location: [place];  Service: Orthopedics;  Laterality: Right;  . THYROID SURGERY     bx    Current Outpatient Medications on File Prior to Visit  Medication Sig Dispense Refill  . amLODipine (NORVASC) 5 MG tablet     . fexofenadine (ALLEGRA) 30 MG tablet Take 30 mg by mouth daily as needed.     . fluticasone (FLONASE) 50 MCG/ACT nasal spray Place 2 sprays into both nostrils daily. 16 g 6  . azelastine (ASTELIN) 0.1 % nasal spray Place 1 spray into both nostrils daily. Use in each nostril as directed    . Cholecalciferol (VITAMIN D3) 1000 units CAPS Take by mouth.    . hyoscyamine (LEVSIN SL) 0.125 MG SL tablet 1-2 tabs po q6h prn abd pains (Patient not taking: Reported on 06/11/2017) 30 tablet 0  . metoprolol succinate (  TOPROL-XL) 25 MG 24 hr tablet Take 1 tablet (25 mg total) by mouth daily. (Patient not taking: Reported on 08/07/2016) 30 tablet 0  . vitamin B-12 (CYANOCOBALAMIN) 1000 MCG tablet 1000 mcg daily for 7 days and then 1000 mcg weekly (Patient not taking: Reported on 08/07/2016) 18 tablet 0   No current facility-administered medications on file prior to visit.     Social History   Socioeconomic History  . Marital status: Single    Spouse name: Not on file  . Number of children: 5  . Years of education: Not on file  . Highest education level: Not on file  Occupational History    Employer: [employer]     Comment: assembly line  Social Needs  . Financial resource strain: Not on file  . Food insecurity:    Worry: Not on file    Inability: Not on file  . Transportation needs:    Medical: Not on file    Non-medical: Not on file  Tobacco Use  . Smoking status: Former Smoker    Packs/day: 0.25    Years: 54.00    Pack years: 13.50    Types: Cigarettes, E-cigarettes    Last attempt to quit: 10/25/2014    Years since quitting: 2.6  . Smokeless tobacco: Never Used  Substance and Sexual Activity  . Alcohol use: No  . Drug use: No  . Sexual activity: Not on file  Lifestyle  . Physical activity:    Days per week: Not on file    Minutes per session: Not on file  . Stress: Not on file  Relationships  . Social connections:    Talks on phone: Not on file    Gets together: Not on file    Attends religious service: Not on file    Active member of club or organization: Not on file    Attends meetings of clubs or organizations: Not on file    Relationship status: Not on file  . Intimate partner violence:    Fear of current or ex partner: Not on file    Emotionally abused: Not on file    Physically abused: Not on file    Forced sexual activity: Not on file  Other Topics Concern  . Not on file  Social History Narrative   Unemployed: Single   She has 5 grown children.    First menstrual cycle: 13 yrs   Last menstrual cycle: 47 yrs   6 pregnancies   1 miscarriage   Smoker: 1/2 pk daily   Pt donated kidney to daughter 5 yrs ago                Family History  Problem Relation Age of Onset  . Arthritis Mother        rheumatoid  . Hypertension Mother   . Heart disease Mother        Irregular heart beat   . Arrhythmia Mother   . Rheum arthritis Mother   . Pulmonary fibrosis Mother   . Cancer Father        Lung  . COPD Father   . Hypertension Sister   . Aneurysm Brother 11       brain  . Heart disease Maternal Aunt   . Pulmonary fibrosis Maternal Aunt   . Arthritis  Maternal Uncle        rheumatoid  . Pulmonary fibrosis Maternal Uncle   . Heart disease Maternal Grandmother   . Diabetes Maternal Uncle   .  Hypertension Sister     BP 133/80   Pulse 62   Temp (!) 97.3 F (36.3 C)   Ht 5\' 7"  (1.702 m)   Wt 137 lb (62.1 kg)   BMI 21.46 kg/m       Objective:   Physical Exam  Constitutional: She is oriented to person, place, and time. She appears well-developed and well-nourished.  HENT:  Head: Normocephalic and atraumatic.  Eyes: Pupils are equal, round, and reactive to light. Conjunctivae and EOM are normal.  Neck: Normal range of motion. Neck supple.  Cardiovascular: Normal rate, regular rhythm and intact distal pulses.  Pulmonary/Chest: Effort normal.  Abdominal: Soft.  Musculoskeletal:       Right wrist: She exhibits tenderness.       Arms: Neurological: She is alert and oriented to person, place, and time. She has normal reflexes. She displays normal reflexes. No cranial nerve deficit. She exhibits normal muscle tone. Coordination normal.  Skin: Skin is warm and dry.  Psychiatric: She has a normal mood and affect. Her behavior is normal. Judgment and thought content normal.    I have reviewed the operative report from Dr. Grandville Silos.      Assessment & Plan:   Encounter Diagnosis  Name Primary?  . Radial styloid tenosynovitis (de quervain) Yes   Procedure note: After permission from the patient and prep of the area of the right wrist first compartment, the tendon sheath of the first compartment was injected with 1 % plain Xylocaine and 1 cc of DepoMedrol 40 by sterile technique tolerated well.  I will see her in two weeks.  Call if any problem.  Precautions discussed.   Electronically Signed Sanjuana Kava, MD 6/6/20199:01 AM

## 2017-06-12 ENCOUNTER — Telehealth: Payer: Self-pay | Admitting: Orthopaedic Surgery

## 2017-06-12 NOTE — Telephone Encounter (Signed)
Patient called stating her wrist was hurting real bad and wanted to know what to do. I explained to her that there was no clinical person in the office at this time and I am not clinical so I could not advise her. I did ask her if she was told to use ice and she stated no. I told her that if she was hurting real bad maybe she should see her PCP or the ER if all else fails and maybe try the ice. I also told her that if she was still hurting Monday to please call the office and we will put her on Dr. Brooke Bonito schedule for Tuesday. She stated she would try the ice.

## 2017-06-16 NOTE — Telephone Encounter (Signed)
She has appointment tomorrow. Arbie Cookey has asked me to call patient, she states her hand pain has gotten worse, she was not able to mow. She has pain all the way up her hand / wrist/ up to shoulder, she denies neck pain. She heard a pop during injection deQuervain and I have told her this is expected during this type of injection, and to use ice, keep appt for tomorrow with Dr. Luna Glasgow  To you FYI

## 2017-06-16 NOTE — Telephone Encounter (Signed)
Patient called back to follow up on message; states "whole hand and wrist are hurting" - please advise.

## 2017-06-17 ENCOUNTER — Encounter: Payer: Self-pay | Admitting: Orthopaedic Surgery

## 2017-06-17 ENCOUNTER — Ambulatory Visit (INDEPENDENT_AMBULATORY_CARE_PROVIDER_SITE_OTHER): Payer: Medicare Other | Admitting: Orthopaedic Surgery

## 2017-06-17 VITALS — BP 123/78 | HR 60 | Ht 67.0 in | Wt 137.0 lb

## 2017-06-17 DIAGNOSIS — M778 Other enthesopathies, not elsewhere classified: Secondary | ICD-10-CM

## 2017-06-17 MED ORDER — PREDNISONE 5 MG (21) PO TBPK: ORAL_TABLET | ORAL | 0 refills | Status: DC

## 2017-06-17 NOTE — Progress Notes (Signed)
Patient [name], female DOB:10-29-1955, 62 y.o. UGQ:[id number]  Chief Complaint  Patient presents with  . Follow-up    Right hand and wrist pain getting worse    HPI  [name] is a 62 y.o. female who was seen last week for DeQuervain's on the right.  I gave her an injection.  She called saying she had pain going up to the elbow from the wrist on the right and was hurting more.  I had her come in.  She has very little pain of the first extensor compartment today.  Motion is good. She has no swelling or redness.  She has tenderness of the volar hand and wrist and pain that radiates to the mid lower arm.  She has no redness, no numbness.  She has been very active cleaning at home since the thumb was better. HPI  Body mass index is 21.46 kg/m.  ROS  Review of Systems  Respiratory: Negative for cough and shortness of breath.   Cardiovascular: Negative for chest pain and leg swelling.  Endocrine: Negative for cold intolerance.  Musculoskeletal: Positive for arthralgias.  Allergic/Immunologic: Positive for environmental allergies.  Neurological: Positive for headaches.  Psychiatric/Behavioral: The patient is nervous/anxious.   All other systems reviewed and are negative.   Past Medical History:  Diagnosis Date  . Allergy   . Anxiety   . Arthritis   . Chronic kidney disease    kidney donor, has only one kidney  . Hypertension   . 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 Migraines   . Thyroid disease    Hx of   . Wears dentures    top  . Wears glasses     Past Surgical History:  Procedure Laterality Date  . ABDOMINAL HYSTERECTOMY  1991   partial  . BLADDER SURGERY     bladder tac, then bladder tack reversal due to adhesions.  . DORSAL COMPARTMENT RELEASE Right 02/07/2013   Procedure: RIGHT WRIST DEQUERVAIN RELEASE;  Surgeon: Jolyn Nap, MD;  Location: [place];  Service: Orthopedics;  Laterality: Right;  . KIDNEY  DONATION  2008   gave her lt kidney to daughter  . NECK MASS EXCISION     rt-mass  . NECK SURGERY  1990   rt ? parotidectomy  . SHOULDER ARTHROSCOPY WITH OPEN ROTATOR CUFF REPAIR AND DISTAL CLAVICLE ACROMINECTOMY Right 04/25/2014   Procedure: SHOULDER ARTHROSCOPY WITH MINI-OPEN ROTATOR CUFF REPAIR AND DISTAL CLAVICLE RESECTION;  Surgeon: Garald Balding, MD;  Location: [place];  Service: Orthopedics;  Laterality: Right;  . SHOULDER CLOSED REDUCTION Right 08/31/2014   Procedure: CLOSED MANIPULATION SHOULDER;  Surgeon: Garald Balding, MD;  Location: [place];  Service: Orthopedics;  Laterality: Right;  . SUBACROMIAL DECOMPRESSION Right 04/25/2014   Procedure: SUBACROMIAL DECOMPRESSION;  Surgeon: Garald Balding, MD;  Location: [place];  Service: Orthopedics;  Laterality: Right;  . THYROID SURGERY     bx    Family History  Problem Relation Age of Onset  . Arthritis Mother        rheumatoid  . Hypertension Mother   . Heart disease Mother        Irregular heart beat   . Arrhythmia Mother   . Rheum arthritis Mother   . Pulmonary fibrosis Mother   . Cancer Father        Lung  . COPD Father   . Hypertension Sister   .  Aneurysm Brother 11       brain  . Heart disease Maternal Aunt   . Pulmonary fibrosis Maternal Aunt   . Arthritis Maternal Uncle        rheumatoid  . Pulmonary fibrosis Maternal Uncle   . Heart disease Maternal Grandmother   . Diabetes Maternal Uncle   . Hypertension Sister     Social History Social History   Tobacco Use  . Smoking status: Former Smoker    Packs/day: 0.25    Years: 54.00    Pack years: 13.50    Types: Cigarettes, E-cigarettes    Last attempt to quit: 10/25/2014    Years since quitting: 2.6  . Smokeless tobacco: Never Used  Substance Use Topics  . Alcohol use: No  . Drug use: No    Allergies  Allergen Reactions  . Ace Inhibitors Cough  . Celebrex [Celecoxib] Hives  . Dilaudid  [Hydromorphone] Nausea And Vomiting    Severe N/V  . Lactose Intolerance (Gi) Diarrhea    cramping  . Sudafed [Pseudoephedrine Hcl]     Makes "high"  . Losartan Palpitations  . Mucinex [Guaifenesin Er] Palpitations  . Sulfonamide Derivatives Rash    Current Outpatient Medications  Medication Sig Dispense Refill  . amLODipine (NORVASC) 5 MG tablet     . azelastine (ASTELIN) 0.1 % nasal spray Place 1 spray into both nostrils daily. Use in each nostril as directed    . Cholecalciferol (VITAMIN D3) 1000 units CAPS Take by mouth.    . fexofenadine (ALLEGRA) 30 MG tablet Take 30 mg by mouth daily as needed.     . fluticasone (FLONASE) 50 MCG/ACT nasal spray Place 2 sprays into both nostrils daily. 16 g 6  . hyoscyamine (LEVSIN SL) 0.125 MG SL tablet 1-2 tabs po q6h prn abd pains (Patient not taking: Reported on 06/11/2017) 30 tablet 0  . metoprolol succinate (TOPROL-XL) 25 MG 24 hr tablet Take 1 tablet (25 mg total) by mouth daily. (Patient not taking: Reported on 08/07/2016) 30 tablet 0  . predniSONE (STERAPRED UNI-PAK 21 TAB) 5 MG (21) TBPK tablet Take 6 pills first day; 5 pills second day; 4 pills third day; 3 pills fourth day; 2 pills next day and 1 pill last day. 21 tablet 0  . vitamin B-12 (CYANOCOBALAMIN) 1000 MCG tablet 1000 mcg daily for 7 days and then 1000 mcg weekly (Patient not taking: Reported on 08/07/2016) 18 tablet 0   No current facility-administered medications for this visit.      Physical Exam  Blood pressure 123/78, pulse 60, height 5\' 7"  (1.702 m), weight 137 lb (62.1 kg).  Constitutional: overall normal hygiene, normal nutrition, well developed, normal grooming, normal body habitus. Assistive device:none  Musculoskeletal: gait and station Limp none, muscle tone and strength are normal, no tremors or atrophy is present.  .  Neurological: coordination overall normal.  Deep tendon reflex/nerve stretch intact.  Sensation normal.  Cranial nerves II-XII intact.   Skin:    Normal overall no scars, lesions, ulcers or rashes. No psoriasis.  Psychiatric: Alert and oriented x 3.  Recent memory intact, remote memory unclear.  Normal mood and affect. Well groomed.  Good eye contact.  Cardiovascular: overall no swelling, no varicosities, no edema bilaterally, normal temperatures of the legs and arms, no clubbing, cyanosis and good capillary refill.  Lymphatic: palpation is normal.  Right hand first extensor compartment with no pain, no swelling, very slight tenderness.  Negative Wynn Maudlin is present.  NV intact.  The volar  hand is tender more over the long flexors and she has pain to resistance to flexion more over the long finger and mid wrist.  She has no numbness, NV intact. Grip decreased.  Flexion causes pain at times.  All other systems reviewed and are negative   The patient has been educated about the nature of the problem(s) and counseled on treatment options.  The patient appeared to understand what I have discussed and is in agreement with it.  Encounter Diagnosis  Name Primary?  . Tendinitis of right wrist Yes   She has flexor tendinitis of the right wrist.  I will give Prednisone dose pack.  I have given cock-up splint as well.  PLAN Call if any problems.  Precautions discussed.  Continue current medications. Begin dose pack prednisone.  Return to clinic 1 week   Electronically Signed Sanjuana Kava, MD 6/12/201910:26 AM

## 2017-06-25 ENCOUNTER — Encounter: Payer: Self-pay | Admitting: Orthopaedic Surgery

## 2017-06-25 ENCOUNTER — Ambulatory Visit: Payer: Medicare Other | Admitting: Orthopaedic Surgery

## 2017-07-13 ENCOUNTER — Telehealth: Payer: Self-pay | Admitting: Orthopaedic Surgery

## 2017-07-13 NOTE — Telephone Encounter (Signed)
Patient called today and stated that she fell and although she didn't think she broke anything, she wanted to see Dr. Luna Glasgow.  She states she fell on her right shoulder and left wrist.  She then stated that she has had surgery on this right shoulder before by Dr. [name]. She was told at that time, that there was a 50-50 chance she would have to have this repeated.  She said she has had left wrist surgery also.     She is going to call to see if she can see Dr. [name] again regarding her shoulder

## 2017-07-14 DIAGNOSIS — Z6821 Body mass index (BMI) 21.0-21.9, adult: Secondary | ICD-10-CM | POA: Diagnosis not present

## 2017-07-14 DIAGNOSIS — M79662 Pain in left lower leg: Secondary | ICD-10-CM | POA: Diagnosis not present

## 2017-07-14 DIAGNOSIS — M79605 Pain in left leg: Secondary | ICD-10-CM | POA: Diagnosis not present

## 2017-10-17 ENCOUNTER — Emergency Department (HOSPITAL_COMMUNITY)
Admission: EM | Admit: 2017-10-17 | Discharge: 2017-10-17 | Disposition: A | Discharge status: Home / Self Care | Payer: Medicare Other | Attending: Emergency Medicine | Admitting: Emergency Medicine

## 2017-10-17 ENCOUNTER — Encounter (HOSPITAL_COMMUNITY): Payer: Self-pay | Admitting: Emergency Medicine

## 2017-10-17 ENCOUNTER — Other Ambulatory Visit: Payer: Self-pay

## 2017-10-17 ENCOUNTER — Emergency Department (HOSPITAL_COMMUNITY): Payer: Medicare Other

## 2017-10-17 DIAGNOSIS — R079 Chest pain, unspecified: Secondary | ICD-10-CM | POA: Diagnosis not present

## 2017-10-17 DIAGNOSIS — I129 Hypertensive chronic kidney disease with stage 1 through stage 4 chronic kidney disease, or unspecified chronic kidney disease: Secondary | ICD-10-CM | POA: Diagnosis not present

## 2017-10-17 DIAGNOSIS — Z79899 Other long term (current) drug therapy: Secondary | ICD-10-CM | POA: Diagnosis not present

## 2017-10-17 DIAGNOSIS — Z87891 Personal history of nicotine dependence: Secondary | ICD-10-CM | POA: Diagnosis not present

## 2017-10-17 DIAGNOSIS — R0789 Other chest pain: Secondary | ICD-10-CM | POA: Insufficient documentation

## 2017-10-17 DIAGNOSIS — N189 Chronic kidney disease, unspecified: Secondary | ICD-10-CM | POA: Diagnosis not present

## 2017-10-17 DIAGNOSIS — J449 Chronic obstructive pulmonary disease, unspecified: Secondary | ICD-10-CM | POA: Diagnosis not present

## 2017-10-17 LAB — CBC WITH DIFFERENTIAL/PLATELET
ABS IMMATURE GRANULOCYTES: 0.02 10*3/uL (ref 0.00–0.07)
Basophils Absolute: 0 10*3/uL (ref 0.0–0.1)
Basophils Relative: 1 %
Eosinophils Absolute: 0.1 10*3/uL (ref 0.0–0.5)
Eosinophils Relative: 2 %
HEMATOCRIT: 39.7 % (ref 36.0–46.0)
Hemoglobin: 13.1 g/dL (ref 12.0–15.0)
IMMATURE GRANULOCYTES: 1 %
LYMPHS ABS: 0.9 10*3/uL (ref 0.7–4.0)
Lymphocytes Relative: 20 %
MCH: 30.7 pg (ref 26.0–34.0)
MCHC: 33 g/dL (ref 30.0–36.0)
MCV: 93 fL (ref 80.0–100.0)
MONO ABS: 0.3 10*3/uL (ref 0.1–1.0)
MONOS PCT: 7 %
NEUTROS ABS: 3.1 10*3/uL (ref 1.7–7.7)
Neutrophils Relative %: 69 %
Platelets: 189 10*3/uL (ref 150–400)
RBC: 4.27 MIL/uL (ref 3.87–5.11)
RDW: 12 % (ref 11.5–15.5)
WBC: 4.4 10*3/uL (ref 4.0–10.5)
nRBC: 0 % (ref 0.0–0.2)

## 2017-10-17 LAB — BASIC METABOLIC PANEL
Anion gap: 6 (ref 5–15)
BUN: 17 mg/dL (ref 8–23)
CHLORIDE: 104 mmol/L (ref 98–111)
CO2: 28 mmol/L (ref 22–32)
CREATININE: 1.09 mg/dL — AB (ref 0.44–1.00)
Calcium: 9.2 mg/dL (ref 8.9–10.3)
GFR calc Af Amer: >60 mL/min (ref >60)
GFR calc non Af Amer: 53 mL/min — ABNORMAL LOW (ref >60)
GLUCOSE: 115 mg/dL — AB (ref 70–99)
Potassium: 3.8 mmol/L (ref 3.5–5.1)
Sodium: 138 mmol/L (ref 135–145)

## 2017-10-17 LAB — TROPONIN I: Troponin I: <0.03 ng/mL (ref <0.03)

## 2017-10-17 LAB — D-DIMER, QUANTITATIVE: D-Dimer, Quant: <0.27 ug/mL-FEU (ref 0.00–0.50)

## 2017-10-17 MED ORDER — ACETAMINOPHEN 325 MG PO TABS
650.0000 mg | ORAL_TABLET | Freq: Once | ORAL | Status: AC
  Administered 2017-10-17: 650 mg via ORAL
  Filled 2017-10-17: qty 2

## 2017-10-17 MED ORDER — METHOCARBAMOL 750 MG PO TABS: 750.0000 mg | ORAL_TABLET | Freq: Three times a day (TID) | ORAL | 0 refills | Status: DC | PRN

## 2017-10-17 MED ORDER — HYDROCODONE-ACETAMINOPHEN 5-325 MG PO TABS
1.0000 | ORAL_TABLET | Freq: Once | ORAL | Status: DC
  Filled 2017-10-17: qty 1

## 2017-10-17 NOTE — ED Notes (Signed)
Pt did not want her pain medication as she is "not hurting that bad" and has to drive self home to babysit her grandkids later.

## 2017-10-17 NOTE — ED Triage Notes (Signed)
Pt states chest pain with radiation into back and left arm for three days. Denies SOB/dizziness or sweating.

## 2017-10-17 NOTE — ED Notes (Signed)
Patient transported to X-ray 

## 2017-10-17 NOTE — ED Provider Notes (Signed)
[facility] EMERGENCY DEPARTMENT Provider Note   CSN: [id number] Arrival date & time: 10/17/17  1308     History   Chief Complaint Chief Complaint  Patient presents with  . Chest Pain    HPI [name] is a 62 y.o. female.  HPI  Pt was seen at 1325. Per pt, c/o gradual onset and persistence of constant left upper chest wall "pain" that began 3 days ago. Pt describes the pain as "aching" with radiation into her left upper back. Pain worsens with palpation of the area and body position changes.  Pt states the pain began after she was "fussing with a microwave shelf." Denies palpitations, no SOB/cough, no abd pain, no N/V/D, no direct injury, no rash, no fevers, no calf/LE pain or unilateral swelling.     Past Medical History:  Diagnosis Date  . Allergy   . Anxiety   . Arthritis   . Chronic kidney disease    kidney donor, has only one kidney  . Hypertension   . 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 Migraines   . Thyroid disease    Hx of   . Wears dentures    top  . Wears glasses     Patient Active Problem List   Diagnosis Date Noted  . Chronic kidney disease 11/06/2015  . 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 Cough variant not due to asthma 05/16/2014  . Essential hypertension, benign 05/16/2014  . Rotator cuff impingement syndrome of right shoulder 04/25/2014  . 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  . Thyroiditis, subacute 12/22/2013  . Right thyroid nodule 12/22/2013  . Irritability 12/08/2013  . Arthritis 12/08/2013  . Joint pain 10/03/2013  . 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 History of nephrectomy 08/10/2013  . Paresthesia of both hands 08/09/2013  . C O P D 03/05/2007    Past Surgical History:  Procedure Laterality Date  . ABDOMINAL HYSTERECTOMY  1991   partial  . BLADDER SURGERY     bladder tac, then bladder tack reversal due to adhesions.  . DORSAL COMPARTMENT RELEASE Right  02/07/2013   Procedure: RIGHT WRIST DEQUERVAIN RELEASE;  Surgeon: Jolyn Nap, MD;  Location: [place];  Service: Orthopedics;  Laterality: Right;  . KIDNEY DONATION  2008   gave her lt kidney to daughter  . NECK MASS EXCISION     rt-mass  . NECK SURGERY  1990   rt ? parotidectomy  . SHOULDER ARTHROSCOPY WITH OPEN ROTATOR CUFF REPAIR AND DISTAL CLAVICLE ACROMINECTOMY Right 04/25/2014   Procedure: SHOULDER ARTHROSCOPY WITH MINI-OPEN ROTATOR CUFF REPAIR AND DISTAL CLAVICLE RESECTION;  Surgeon: Garald Balding, MD;  Location: [place];  Service: Orthopedics;  Laterality: Right;  . SHOULDER CLOSED REDUCTION Right 08/31/2014   Procedure: CLOSED MANIPULATION SHOULDER;  Surgeon: Garald Balding, MD;  Location: [place];  Service: Orthopedics;  Laterality: Right;  . SUBACROMIAL DECOMPRESSION Right 04/25/2014   Procedure: SUBACROMIAL DECOMPRESSION;  Surgeon: Garald Balding, MD;  Location: [place];  Service: Orthopedics;  Laterality: Right;  . THYROID SURGERY     bx     OB History   None      Home Medications    Prior to Admission medications   Medication Sig Start Date End Date Taking? Authorizing Provider  ALPRAZolam Duanne Moron) 0.25 MG tablet Take 1 tablet by mouth daily as needed. 09/02/16  Yes [provider]  amLODipine (NORVASC) 5 MG tablet  04/15/17   [provider]  azelastine (ASTELIN) 0.1 % nasal spray Place 1 spray into both nostrils daily. Use in each nostril as directed    [provider]  Cholecalciferol (VITAMIN D3) 1000 units CAPS Take by mouth.    [provider]  fexofenadine (ALLEGRA) 30 MG tablet Take 30 mg by mouth daily as needed.     [provider]  fluticasone (FLONASE) 50 MCG/ACT nasal spray Place 2 sprays into both nostrils daily. 04/11/16   Kuneff, Renee A, DO  hyoscyamine (LEVSIN SL) 0.125 MG SL tablet 1-2 tabs po q6h prn abd pains Patient not  taking: Reported on 06/11/2017 08/07/16   Tammi Sou, MD  metoprolol succinate (TOPROL-XL) 25 MG 24 hr tablet Take 1 tablet (25 mg total) by mouth daily. Patient not taking: Reported on 08/07/2016 04/02/16   Tammi Sou, MD  predniSONE (STERAPRED UNI-PAK 21 TAB) 5 MG (21) TBPK tablet Take 6 pills first day; 5 pills second day; 4 pills third day; 3 pills fourth day; 2 pills next day and 1 pill last day. 06/17/17   Sanjuana Kava, MD  vitamin B-12 (CYANOCOBALAMIN) 1000 MCG tablet 1000 mcg daily for 7 days and then 1000 mcg weekly Patient not taking: Reported on 08/07/2016 02/12/16   Ma Hillock, DO    Family History Family History  Problem Relation Age of Onset  . Arthritis Mother        rheumatoid  . Hypertension Mother   . Heart disease Mother        Irregular heart beat   . Arrhythmia Mother   . Rheum arthritis Mother   . Pulmonary fibrosis Mother   . Cancer Father        Lung  . COPD Father   . Hypertension Sister   . Aneurysm Brother 11       brain  . Heart disease Maternal Aunt   . Pulmonary fibrosis Maternal Aunt   . Arthritis Maternal Uncle        rheumatoid  . Pulmonary fibrosis Maternal Uncle   . Heart disease Maternal Grandmother   . Diabetes Maternal Uncle   . Hypertension Sister     Social History Social History   Tobacco Use  . Smoking status: Former Smoker    Packs/day: 0.25    Years: 54.00    Pack years: 13.50    Types: Cigarettes, E-cigarettes    Last attempt to quit: 10/25/2014    Years since quitting: 2.9  . Smokeless tobacco: Never Used  Substance Use Topics  . Alcohol use: No  . Drug use: No     Allergies   Ace inhibitors; Celebrex [celecoxib]; Dilaudid [hydromorphone]; Erythromycin; Lactose intolerance (gi); Sudafed [pseudoephedrine hcl]; Losartan; Mucinex [guaifenesin er]; and Sulfonamide derivatives   Review of Systems Review of Systems ROS: Statement: All systems negative except as marked or noted in the HPI; Constitutional:  Negative for fever and chills. ; ; Eyes: Negative for eye pain, redness and discharge. ; ; ENMT: Negative for ear pain, hoarseness, nasal congestion, sinus pressure and sore throat. ; ; Cardiovascular: Negative for palpitations, diaphoresis, dyspnea and peripheral edema. ; ; Respiratory: Negative for cough, wheezing and stridor. ; ; Gastrointestinal: Negative for nausea, vomiting, diarrhea, abdominal pain, blood in stool, hematemesis, jaundice and rectal bleeding. . ; ; Genitourinary: Negative for dysuria, flank pain and hematuria. ; ; Musculoskeletal: +chest wall pain. Negative for neck pain. Negative for swelling and trauma.; ; Skin: Negative  for pruritus, rash, abrasions, blisters, bruising and skin lesion.; ; Neuro: Negative for headache, lightheadedness and neck stiffness. Negative for weakness, altered level of consciousness, altered mental status, extremity weakness, paresthesias, involuntary movement, seizure and syncope.       Physical Exam Updated Vital Signs BP 137/74   Pulse 70   Temp (!) 97.4 F (36.3 C) (Temporal)   Resp 16   SpO2 99%   Physical Exam 1330: Physical examination:  Nursing notes reviewed; Vital signs and O2 SAT reviewed;  Constitutional: Well developed, Well nourished, Well hydrated, In no acute distress; Head:  Normocephalic, atraumatic; Eyes: EOMI, PERRL, No scleral icterus; ENMT: Mouth and pharynx normal, Mucous membranes moist; Neck: Supple, Full range of motion, No lymphadenopathy; Cardiovascular: Regular rate and rhythm, No gallop; Respiratory: Breath sounds clear & equal bilaterally, No wheezes.  Speaking full sentences with ease, Normal respiratory effort/excursion; Chest: +left upper anterior chest wall tender to palp. No deformity, no soft tissue crepitus. Movement normal; Abdomen: Soft, Nontender, Nondistended, Normal bowel sounds; Genitourinary: No CVA tenderness; Spine:  No midline CS, TS, LS tenderness. +TTP left upper thoracic paraspinal and left trapezius  muscles. No rash.;; Extremities: Peripheral pulses normal, No tenderness, No edema, No calf edema or asymmetry.; Neuro: AA&Ox3, Major CN grossly intact.  Speech clear. No gross focal motor or sensory deficits in extremities.; Skin: Color normal, Warm, Dry.   ED Treatments / Results  Labs (all labs ordered are listed, but only abnormal results are displayed)   EKG EKG Interpretation  Date/Time:  Saturday October 17 2017 13:12:54 EDT Ventricular Rate:  84 PR Interval:  142 QRS Duration: 94 QT Interval:  382 QTC Calculation: 451 R Axis:   -45 Text Interpretation:  Normal sinus rhythm Incomplete right bundle branch block Left anterior fascicular block Artifact When compared with ECG of 03/22/2015 No significant change was found Confirmed by Francine Graven [phone]) on 10/17/2017 2:07:16 PM   Radiology   Procedures Procedures (including critical care time)  Medications Ordered in ED Medications  HYDROcodone-acetaminophen (NORCO/VICODIN) 5-325 MG per tablet 1 tablet (1 tablet Oral Not Given 10/17/17 1337)     Initial Impression / Assessment and Plan / ED Course  I have reviewed the triage vital signs and the nursing notes.  Pertinent labs & imaging results that were available during my care of the patient were reviewed by me and considered in my medical decision making (see chart for details).  MDM Reviewed: previous chart, nursing note and vitals Reviewed previous: labs and ECG Interpretation: labs, ECG and x-ray   Results for orders placed or performed during the hospital encounter of 62/22/97  Basic metabolic panel  Result Value Ref Range   Sodium 138 135 - 145 mmol/L   Potassium 3.8 3.5 - 5.1 mmol/L   Chloride 104 98 - 111 mmol/L   CO2 28 22 - 32 mmol/L   Glucose, Bld 115 (H) 70 - 99 mg/dL   BUN 17 8 - 23 mg/dL   Creatinine, Ser 1.09 (H) 0.44 - 1.00 mg/dL   Calcium 9.2 8.9 - 10.3 mg/dL   GFR calc non Af Amer 53 (L) >60 mL/min   GFR calc Af Amer >60 >60 mL/min    Anion gap 6 5 - 15  Troponin I  Result Value Ref Range   Troponin I <0.03 <0.03 ng/mL  CBC with Differential  Result Value Ref Range   WBC 4.4 4.0 - 10.5 K/uL   RBC 4.27 3.87 - 5.11 MIL/uL   Hemoglobin 13.1 12.0 - 15.0  g/dL   HCT 39.7 36.0 - 46.0 %   MCV 93.0 80.0 - 100.0 fL   MCH 30.7 26.0 - 34.0 pg   MCHC 33.0 30.0 - 36.0 g/dL   RDW 12.0 11.5 - 15.5 %   Platelets 189 150 - 400 K/uL   nRBC 0.0 0.0 - 0.2 %   Neutrophils Relative % 69 %   Neutro Abs 3.1 1.7 - 7.7 K/uL   Lymphocytes Relative 20 %   Lymphs Abs 0.9 0.7 - 4.0 K/uL   Monocytes Relative 7 %   Monocytes Absolute 0.3 0.1 - 1.0 K/uL   Eosinophils Relative 2 %   Eosinophils Absolute 0.1 0.0 - 0.5 K/uL   Basophils Relative 1 %   Basophils Absolute 0.0 0.0 - 0.1 K/uL   Immature Granulocytes 1 %   Abs Immature Granulocytes 0.02 0.00 - 0.07 K/uL  D-dimer, quantitative  Result Value Ref Range   D-Dimer, Quant <0.27 0.00 - 0.50 ug/mL-FEU   Dg Chest 2 View Result Date: 10/17/2017 CLINICAL DATA:  Chest pain radiating into the back. EXAM: CHEST - 2 VIEW COMPARISON:  Chest x-ray dated March 22, 2015. FINDINGS: The heart size and mediastinal contours are within normal limits. Normal pulmonary vascularity. The lungs remain hyperinflated with emphysematous changes. No focal consolidation, pleural effusion, or pneumothorax. No acute osseous abnormality. IMPRESSION: No active cardiopulmonary disease.  COPD. Electronically Signed   By: Titus Dubin M.D.   On: 10/17/2017 14:11    1525:   Workup reassuring. Doubt PE as cause for symptoms with normal d-dimer and low risk [place].  Doubt ACS as cause for symptoms with normal troponin and unchanged EKG from previous after 3 days of constant atypical symptoms.   Tx symptomatically at this time. Dx and testing d/w pt.  Questions answered.  Verb understanding, agreeable to d/c home with outpt f/u.   Final Clinical Impressions(s) / ED Diagnoses   Final diagnoses:  None    ED Discharge  Orders    None       Francine Graven, DO 10/21/17 7939

## 2017-10-17 NOTE — Discharge Instructions (Signed)
Take the prescription as directed. Also take over the counter tylenol, as directed on packaging, as needed for discomfort.  Apply moist heat or ice to the area(s) of discomfort, for 15 minutes at a time, several times per day for the next few days.  Do not fall asleep on a heating or ice pack.  Call your regular medical doctor on Monday to schedule a follow up appointment this week.  Return to the Emergency Department immediately if worsening.

## 2017-10-22 DIAGNOSIS — M62838 Other muscle spasm: Secondary | ICD-10-CM | POA: Diagnosis not present

## 2017-10-22 DIAGNOSIS — Z0000 Encounter for general adult medical examination without abnormal findings: Secondary | ICD-10-CM | POA: Diagnosis not present

## 2017-10-22 DIAGNOSIS — Z6821 Body mass index (BMI) 21.0-21.9, adult: Secondary | ICD-10-CM | POA: Diagnosis not present

## 2017-11-09 DIAGNOSIS — N183 Chronic kidney disease, stage 3 (moderate): Secondary | ICD-10-CM | POA: Diagnosis not present

## 2017-11-09 DIAGNOSIS — I129 Hypertensive chronic kidney disease with stage 1 through stage 4 chronic kidney disease, or unspecified chronic kidney disease: Secondary | ICD-10-CM | POA: Diagnosis not present

## 2017-11-09 DIAGNOSIS — N281 Cyst of kidney, acquired: Secondary | ICD-10-CM | POA: Diagnosis not present

## 2017-12-31 DIAGNOSIS — J029 Acute pharyngitis, unspecified: Secondary | ICD-10-CM | POA: Diagnosis not present

## 2017-12-31 DIAGNOSIS — R05 Cough: Secondary | ICD-10-CM | POA: Diagnosis not present

## 2017-12-31 DIAGNOSIS — R509 Fever, unspecified: Secondary | ICD-10-CM | POA: Diagnosis not present

## 2017-12-31 DIAGNOSIS — R5383 Other fatigue: Secondary | ICD-10-CM | POA: Diagnosis not present

## 2018-02-12 DIAGNOSIS — J060 Acute laryngopharyngitis: Secondary | ICD-10-CM | POA: Diagnosis not present

## 2018-02-12 DIAGNOSIS — M25511 Pain in right shoulder: Secondary | ICD-10-CM | POA: Diagnosis not present

## 2018-02-12 DIAGNOSIS — J019 Acute sinusitis, unspecified: Secondary | ICD-10-CM | POA: Diagnosis not present

## 2018-03-23 DIAGNOSIS — R1030 Lower abdominal pain, unspecified: Secondary | ICD-10-CM | POA: Diagnosis not present

## 2018-04-02 DIAGNOSIS — Z1231 Encounter for screening mammogram for malignant neoplasm of breast: Secondary | ICD-10-CM | POA: Diagnosis not present

## 2018-05-06 ENCOUNTER — Telehealth (INDEPENDENT_AMBULATORY_CARE_PROVIDER_SITE_OTHER): Payer: Self-pay | Admitting: Orthopaedic Surgery

## 2018-05-06 NOTE — Telephone Encounter (Signed)
Patient calling for an appointment to see Dr. [name].  She states her hand is hurting and she's has a knot on her wrist.  Wanted to know if he does hand surgery.  I am assuming that she will need an appointment to discuss before scheduling.  She's had shoulder surgery and manipulation for the shoulder with Dr. [name]. Please call her at the following number.    [phone]

## 2018-05-13 ENCOUNTER — Ambulatory Visit: Payer: Medicare Other | Admitting: Orthopaedic Surgery

## 2018-05-13 ENCOUNTER — Ambulatory Visit: Payer: Medicare Other

## 2018-05-13 ENCOUNTER — Encounter: Payer: Self-pay | Admitting: Orthopaedic Surgery

## 2018-05-13 ENCOUNTER — Other Ambulatory Visit: Payer: Self-pay

## 2018-05-13 VITALS — BP 136/84 | HR 70 | Ht 67.0 in | Wt 142.0 lb

## 2018-05-13 DIAGNOSIS — M25531 Pain in right wrist: Secondary | ICD-10-CM | POA: Diagnosis not present

## 2018-05-13 MED ORDER — BUPIVACAINE HCL 0.5 % IJ SOLN
1.0000 mL | INTRAMUSCULAR | Status: AC | PRN
  Administered 2018-05-13: 10:00:00 1 mL

## 2018-05-13 MED ORDER — METHYLPREDNISOLONE ACETATE 40 MG/ML IJ SUSP
20.0000 mg | INTRAMUSCULAR | Status: AC | PRN
  Administered 2018-05-13: 10:00:00 20 mg

## 2018-05-13 MED ORDER — LIDOCAINE HCL 1 % IJ SOLN
1.0000 mL | INTRAMUSCULAR | Status: AC | PRN
  Administered 2018-05-13: 10:00:00 1 mL

## 2018-05-13 NOTE — Progress Notes (Signed)
Office Visit Note   Patient: [name]           Date of Birth: [birth year]           MRN: [id number] Visit Date: 05/13/2018              Requested by: [name], MD [place], [place] [address] PCP: [name], MD   Assessment & Plan: Visit Diagnoses:  1. Pain in right wrist     Plan: Osteoarthritis base of right thumb.  There is some referred pain to the shoulder.  Will inject the basilar thumb joint and evaluate in 2 weeks.  Consider rheumatologic lab  Follow-Up Instructions: Return in about 2 weeks (around 05/27/2018).   Orders:  Orders Placed This Encounter  Procedures  . Hand/UE Inj: R thumb CMC  . XR Wrist 2 Views Right   No orders of the defined types were placed in this encounter.     Procedures: Hand/UE Inj: R thumb CMC for osteoarthritis on 05/13/2018 9:50 AM Medications: 1 mL lidocaine 1 %; 1 mL bupivacaine 0.5 %; 20 mg methylPREDNISolone acetate 40 MG/ML      Clinical Data: No additional findings.   Subjective: Chief Complaint  Patient presents with  . Right Hand - Pain  Patient presents today for right lateral wrist pain. No known injury. he pain has been present for over a month. She cannot open jars or grasp anything without pain. She said that the pain radiates up into her shoulder. She is right hand dominant. She takes Ibuprofen as needed. She had tendonitis surgery in 2015 on that right side.  Also having some right shoulder pain.  She is status post rotator cuff tear repair in 2016 with subsequent manipulation for adhesive capsulitis.  She does have some discomfort with certain motions of her shoulder and cannot bowl.  She does have multiple joint complaints and is on disability  HPI  Review of Systems  Constitutional: Negative for fatigue.  HENT: Negative for ear pain.   Eyes: Negative for pain.  Respiratory: Negative for shortness of breath.   Cardiovascular: Negative for leg swelling.  Gastrointestinal: Positive  for constipation. Negative for diarrhea.  Endocrine: Positive for cold intolerance. Negative for heat intolerance.  Genitourinary: Negative for difficulty urinating.  Musculoskeletal: Negative for joint swelling.  Skin: Negative for rash.  Allergic/Immunologic: Negative for food allergies.  Neurological: Negative for weakness.  Hematological: Does not bruise/bleed easily.  Psychiatric/Behavioral: Positive for sleep disturbance.     Objective: Vital Signs: BP 136/84   Pulse 70   Ht 5\' 7"  (1.702 m)   Wt 142 lb (64.4 kg)   BMI 22.24 kg/m   Physical Exam Constitutional:      Appearance: She is well-developed.  Eyes:     Pupils: Pupils are equal, round, and reactive to light.  Pulmonary:     Effort: Pulmonary effort is normal.  Skin:    General: Skin is warm and dry.  Neurological:     Mental Status: She is alert and oriented to person, place, and time.  Psychiatric:        Behavior: Behavior normal.     Ortho Exam right hand with positive grind test and pain at the base of the thumb at the carpometacarpal joint.  There is some slight hypertrophic change of probably slight subluxation.  Has had prior de Quervain's surgery elsewhere the incision appears to be healed without problem.  Negative Finkelstein's test.  Kermit Balo  capillary refill to fingers.  Good grip and release.  Negative Tinel's over the median nerve.  Able to oppose thumb to little finger.  No pain in the snuffbox  Able to place right arm fully overhead quickly.  Able to scratch the middle of her back.    Specialty Comments:  No specialty comments available.  Imaging: Xr Wrist 2 Views Right  Result Date: 05/13/2018 Films of the right hand were obtained in several projections.  There are some degenerative changes at the base of the thumb at the carpometacarpal joint.  Minimal subluxation.  There are degenerative changes at the metacarpal phalangeal joint of the thumb.  No acute changes.  No ectopic calcification     PMFS History: Patient Active Problem List   Diagnosis Date Noted  . Pain in right wrist 05/13/2018  . Chronic kidney disease 11/06/2015  . 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 Cough variant not due to asthma 05/16/2014  . Essential hypertension, benign 05/16/2014  . Rotator cuff impingement syndrome of right shoulder 04/25/2014  . 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  . Thyroiditis, subacute 12/22/2013  . Right thyroid nodule 12/22/2013  . Irritability 12/08/2013  . Arthritis 12/08/2013  . Joint pain 10/03/2013  . 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 History of nephrectomy 08/10/2013  . Paresthesia of both hands 08/09/2013  . C O P D 03/05/2007   Past Medical History:  Diagnosis Date  . Allergy   . Anxiety   . Arthritis   . Chronic kidney disease    kidney donor, has only one kidney  . Hypertension   . 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 Migraines   . Thyroid disease    Hx of   . Wears dentures    top  . Wears glasses     Family History  Problem Relation Age of Onset  . Arthritis Mother        rheumatoid  . Hypertension Mother   . Heart disease Mother        Irregular heart beat   . Arrhythmia Mother   . Rheum arthritis Mother   . Pulmonary fibrosis Mother   . Cancer Father        Lung  . COPD Father   . Hypertension Sister   . Aneurysm Brother 11       brain  . Heart disease Maternal Aunt   . Pulmonary fibrosis Maternal Aunt   . Arthritis Maternal Uncle        rheumatoid  . Pulmonary fibrosis Maternal Uncle   . Heart disease Maternal Grandmother   . Diabetes Maternal Uncle   . Hypertension Sister     Past Surgical History:  Procedure Laterality Date  . ABDOMINAL HYSTERECTOMY  1991   partial  . BLADDER SURGERY     bladder tac, then bladder tack reversal due to adhesions.  . DORSAL COMPARTMENT RELEASE Right 02/07/2013   Procedure: RIGHT WRIST DEQUERVAIN RELEASE;  Surgeon: Jolyn Nap,  MD;  Location: [place];  Service: Orthopedics;  Laterality: Right;  . KIDNEY DONATION  2008   gave her lt kidney to daughter  . NECK MASS EXCISION     rt-mass  . NECK SURGERY  1990   rt ? parotidectomy  . SHOULDER ARTHROSCOPY WITH OPEN ROTATOR CUFF REPAIR AND DISTAL CLAVICLE ACROMINECTOMY Right 04/25/2014   Procedure: SHOULDER ARTHROSCOPY WITH MINI-OPEN ROTATOR CUFF REPAIR AND DISTAL CLAVICLE RESECTION;  Surgeon: Collier [place]  Sharlotte Alamo, MD;  Location: [place];  Service: Orthopedics;  Laterality: Right;  . SHOULDER CLOSED REDUCTION Right 08/31/2014   Procedure: CLOSED MANIPULATION SHOULDER;  Surgeon: Garald Balding, MD;  Location: [place];  Service: Orthopedics;  Laterality: Right;  . SUBACROMIAL DECOMPRESSION Right 04/25/2014   Procedure: SUBACROMIAL DECOMPRESSION;  Surgeon: Garald Balding, MD;  Location: [place];  Service: Orthopedics;  Laterality: Right;  . THYROID SURGERY     bx   Social History   Occupational History    Employer: [employer]    Comment: assembly line  Tobacco Use  . Smoking status: Former Smoker    Packs/day: 0.25    Years: 54.00    Pack years: 13.50    Types: Cigarettes, E-cigarettes    Last attempt to quit: 10/25/2014    Years since quitting: 3.5  . Smokeless tobacco: Never Used  Substance and Sexual Activity  . Alcohol use: No  . Drug use: No  . Sexual activity: Not on file

## 2018-05-14 DIAGNOSIS — N189 Chronic kidney disease, unspecified: Secondary | ICD-10-CM | POA: Diagnosis not present

## 2018-05-14 DIAGNOSIS — M25511 Pain in right shoulder: Secondary | ICD-10-CM | POA: Diagnosis not present

## 2018-05-14 DIAGNOSIS — J060 Acute laryngopharyngitis: Secondary | ICD-10-CM | POA: Diagnosis not present

## 2018-05-14 DIAGNOSIS — N390 Urinary tract infection, site not specified: Secondary | ICD-10-CM | POA: Diagnosis not present

## 2018-05-21 DIAGNOSIS — R7301 Impaired fasting glucose: Secondary | ICD-10-CM | POA: Diagnosis not present

## 2018-05-21 DIAGNOSIS — R1030 Lower abdominal pain, unspecified: Secondary | ICD-10-CM | POA: Diagnosis not present

## 2018-05-21 DIAGNOSIS — R944 Abnormal results of kidney function studies: Secondary | ICD-10-CM | POA: Diagnosis not present

## 2018-05-21 DIAGNOSIS — E785 Hyperlipidemia, unspecified: Secondary | ICD-10-CM | POA: Diagnosis not present

## 2018-05-21 DIAGNOSIS — I10 Essential (primary) hypertension: Secondary | ICD-10-CM | POA: Diagnosis not present

## 2018-05-31 ENCOUNTER — Encounter: Payer: Self-pay | Admitting: Orthopaedic Surgery

## 2018-05-31 DIAGNOSIS — R197 Diarrhea, unspecified: Secondary | ICD-10-CM | POA: Diagnosis not present

## 2018-05-31 DIAGNOSIS — J029 Acute pharyngitis, unspecified: Secondary | ICD-10-CM | POA: Diagnosis not present

## 2018-05-31 DIAGNOSIS — R52 Pain, unspecified: Secondary | ICD-10-CM | POA: Diagnosis not present

## 2018-05-31 DIAGNOSIS — R5383 Other fatigue: Secondary | ICD-10-CM | POA: Diagnosis not present

## 2018-05-31 DIAGNOSIS — R110 Nausea: Secondary | ICD-10-CM | POA: Diagnosis not present

## 2018-06-01 ENCOUNTER — Ambulatory Visit: Payer: Medicare Other | Admitting: Orthopaedic Surgery

## 2018-06-03 DIAGNOSIS — Z0000 Encounter for general adult medical examination without abnormal findings: Secondary | ICD-10-CM | POA: Diagnosis not present

## 2018-06-05 DIAGNOSIS — K219 Gastro-esophageal reflux disease without esophagitis: Secondary | ICD-10-CM | POA: Diagnosis not present

## 2018-06-05 DIAGNOSIS — J060 Acute laryngopharyngitis: Secondary | ICD-10-CM | POA: Diagnosis not present

## 2018-06-05 DIAGNOSIS — J029 Acute pharyngitis, unspecified: Secondary | ICD-10-CM | POA: Diagnosis not present

## 2018-06-09 ENCOUNTER — Encounter: Payer: Self-pay | Admitting: Gastroenterology

## 2018-06-11 DIAGNOSIS — R490 Dysphonia: Secondary | ICD-10-CM | POA: Diagnosis not present

## 2018-07-08 ENCOUNTER — Other Ambulatory Visit: Payer: Self-pay

## 2018-07-08 ENCOUNTER — Encounter: Payer: Self-pay | Admitting: *Deleted

## 2018-07-08 ENCOUNTER — Ambulatory Visit (INDEPENDENT_AMBULATORY_CARE_PROVIDER_SITE_OTHER): Payer: Medicare Other | Admitting: Gastroenterology

## 2018-07-08 ENCOUNTER — Other Ambulatory Visit: Payer: Self-pay | Admitting: *Deleted

## 2018-07-08 ENCOUNTER — Encounter: Payer: Self-pay | Admitting: Gastroenterology

## 2018-07-08 DIAGNOSIS — K5901 Slow transit constipation: Secondary | ICD-10-CM

## 2018-07-08 DIAGNOSIS — K5900 Constipation, unspecified: Secondary | ICD-10-CM | POA: Insufficient documentation

## 2018-07-08 DIAGNOSIS — R1012 Left upper quadrant pain: Secondary | ICD-10-CM | POA: Insufficient documentation

## 2018-07-08 DIAGNOSIS — Z1211 Encounter for screening for malignant neoplasm of colon: Secondary | ICD-10-CM

## 2018-07-08 MED ORDER — NA SULFATE-K SULFATE-MG SULF 17.5-3.13-1.6 GM/177ML PO SOLN: 1.0000 | Freq: Once | ORAL | 0 refills | Status: AC

## 2018-07-08 NOTE — Progress Notes (Signed)
Subjective:    Patient ID: [name], female    DOB: 08-18-1955, 63 y.o.   MRN: [id number] [name] Squibb, MD  HPI Had constipation WHEN SHE WAS PREGNANT. AFTER PARTIAL HYSTERECTOMY/BLADDER TACT. BOWELS GOT HOOKED TO THE FRONT AND NEEDED A REDO. LAST TWO YEARS: PROBLEMS WITH CONSTIPATION AND TAKES SENNOKOT. 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 NEVER USED LACTASE PILLS. CERTAIN FOODS SHE EATS: GRAVY, MILK, ICE CREAM, RED FOODS-LLQ ABDOMINAL PAIN(SEVERE, DOUBLES HER OVER/ 100%DIARRHEA. AFTER A BM THE PAIN GOES AWAY. NO SENNOKOT IN A MONTH. NOT USING BATHROOM LIKE SHE SHOULD WENT BACK TO WHEAT. HAS TO PUSH INSIDE HER VAGINA TO PUSH STOOL OUT. LAST TCS 10 YRS AGO: DR. MAGOD TO DONATE A KIDNEY. RED SAUCE/PEANUT BUTTER/PEANUTS: MIGRAINES. Problems with sedation: VOMITING AFTER ANESTHESIA. HEARTBURN CONTROLLED: PREVACID. QUIT SMOKING 5 YRS AGO.   PT DENIES FEVER, CHILLS, WEIGHT LOSS, ANOREXIA, HEMATOCHEZIA, HEMATEMESIS, nausea, vomiting, melena, CHEST PAIN, SHORTNESS OF BREATH, CHANGE IN BOWEL IN HABITS, problems swallowing, OR heartburn or indigestion.   Past Medical History:  Diagnosis Date  . Allergy   . Anxiety   . Arthritis   . Chronic kidney disease    kidney donor, has only one kidney  . Hypertension   . 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 Migraines   . Thyroid disease    Hx of   . Wears dentures    top  . Wears glasses     Past Surgical History:  Procedure Laterality Date  . ABDOMINAL HYSTERECTOMY  1991   partial  . BLADDER SURGERY     bladder tac, then bladder tack reversal due to adhesions.  . DORSAL COMPARTMENT RELEASE Right 02/07/2013   Procedure: RIGHT WRIST DEQUERVAIN RELEASE;  Surgeon: Jolyn Nap, MD;  Location: [place];  Service: Orthopedics;  Laterality: Right;  . KIDNEY DONATION  2008   gave her lt kidney to daughter  . NECK MASS EXCISION     rt-mass  . NECK SURGERY  1990   rt ? parotidectomy  . SHOULDER  ARTHROSCOPY WITH OPEN ROTATOR CUFF REPAIR AND DISTAL CLAVICLE ACROMINECTOMY Right 04/25/2014   Procedure: SHOULDER ARTHROSCOPY WITH MINI-OPEN ROTATOR CUFF REPAIR AND DISTAL CLAVICLE RESECTION;  Surgeon: Garald Balding, MD;  Location: [place];  Service: Orthopedics;  Laterality: Right;  . SHOULDER CLOSED REDUCTION Right 08/31/2014   Procedure: CLOSED MANIPULATION SHOULDER;  Surgeon: Garald Balding, MD;  Location: [place];  Service: Orthopedics;  Laterality: Right;  . SUBACROMIAL DECOMPRESSION Right 04/25/2014   Procedure: SUBACROMIAL DECOMPRESSION;  Surgeon: Garald Balding, MD;  Location: [place];  Service: Orthopedics;  Laterality: Right;  . THYROID SURGERY     bx    Allergies  Allergen Reactions  . Ace Inhibitors Cough  . Celebrex [Celecoxib] Hives  . Dilaudid [Hydromorphone] Nausea And Vomiting    Severe N/V  . Erythromycin Other (See Comments)  . Lactose Intolerance (Gi) Diarrhea    cramping  . Sudafed [Pseudoephedrine Hcl]     Makes "high"  . Losartan Palpitations  . Mucinex [Guaifenesin Er] Palpitations  . Sulfonamide Derivatives Rash    Current Outpatient Medications  Medication Sig    . amLODipine (NORVASC) 5 MG tablet Take 5 mg by mouth daily.     Marland Kitchen azelastine (ASTELIN) 0.1 % nasal spray Place 1 spray daily. Use in each nostril as directed    . fexofenadine (ALLEGRA) 30 MG tablet Take 30 mg  by mouth daily.     Marland Kitchen FLONASE 50 MCG/ACT nasal spray Place 2 sprays into both nostrils daily.    Marland Kitchen PRILOSEC 40 MG capsule Take 1 capsule by mouth daily.    Marland Kitchen ALPRAZolam (XANAX) 0.25 MG tablet Take 1 tablet by mouth daily as needed.    Marland Kitchen VITAMIN D3 1000 units CAPS Take by mouth.    .      .      .      .      .       Family History  Problem Relation Age of Onset  . Arthritis Mother        rheumatoid  . Hypertension Mother   . Heart disease Mother        Irregular heart beat   . Arrhythmia Mother   . Rheum arthritis  Mother   . Pulmonary fibrosis Mother   . Cancer Father        Lung  . COPD Father   . Hypertension Sister   . Aneurysm Brother 11       brain  . Heart disease Maternal Aunt   . Pulmonary fibrosis Maternal Aunt   . Arthritis Maternal Uncle        rheumatoid  . Pulmonary fibrosis Maternal Uncle   . Heart disease Maternal Grandmother   . Diabetes Maternal Uncle   . Hypertension Sister   . Colon cancer Neg Hx   . Colon polyps Neg Hx     Social History   Socioeconomic History  . Marital status: Single    Spouse name: Not on file  . Number of children: 5  . Years of education: Not on file  . Highest education level: Not on file  Occupational History    Employer: [employer]    Comment: assembly line  Social Needs  . Financial resource strain: Not on file  . Food insecurity    Worry: Not on file    Inability: Not on file  . Transportation needs    Medical: Not on file    Non-medical: Not on file  Tobacco Use  . Smoking status: Former Smoker    Packs/day: 0.25    Years: 54.00    Pack years: 13.50    Types: Cigarettes, E-cigarettes    Quit date: 10/25/2014    Years since quitting: 3.7  . Smokeless tobacco: Never Used  Substance and Sexual Activity  . Alcohol use: No  . Drug use: No  . Sexual activity: Not on file  Lifestyle  . Physical activity    Days per week: Not on file    Minutes per session: Not on file  . Stress: Not on file  Relationships  . Social Herbalist on phone: Not on file    Gets together: Not on file    Attends religious service: Not on file    Active member of club or organization: Not on file    Attends meetings of clubs or organizations: Not on file    Relationship status: Not on file  Other Topics Concern  . Not on file  Social History Narrative   Unemployed: Single, DISABLED FOR 4 YRS-RIGHT SHOULDER. 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   She has 5 grown children.    First menstrual cycle: 13 yrs   Last menstrual cycle: 47  yrs   6 pregnancies   1 miscarriage   Smoker: 1/2 pk daily-QUIT 5 YRS AGO   Pt donated kidney  to daughter 5 yrs ago.   SPENDS FREE TIME WITH 3 GRAND-KIDS: BIKING, TRAMPOLINE, BABY SITS GRANDKIDS.             Review of Systems PER HPI OTHERWISE ALL SYSTEMS ARE NEGATIVE.    Objective:   Physical Exam Vitals signs reviewed.  Constitutional:      General: She is not in acute distress.    Appearance: She is well-developed.  HENT:     Head: Normocephalic and atraumatic.     Mouth/Throat:     Pharynx: No oropharyngeal exudate.  Eyes:     General: No scleral icterus.    Pupils: Pupils are equal, round, and reactive to light.  Neck:     Musculoskeletal: Normal range of motion and neck supple.  Cardiovascular:     Rate and Rhythm: Normal rate and regular rhythm.     Heart sounds: Normal heart sounds.  Pulmonary:     Effort: Pulmonary effort is normal. No respiratory distress.     Breath sounds: Normal breath sounds.  Abdominal:     General: Bowel sounds are normal. There is no distension.     Palpations: Abdomen is soft.     Tenderness: There is no abdominal tenderness.  Musculoskeletal: Normal range of motion.     Right lower leg: No edema.     Left lower leg: No edema.  Lymphadenopathy:     Cervical: No cervical adenopathy.  Neurological:     General: No focal deficit present.     Mental Status: She is alert and oriented to person, place, and time.  Psychiatric:     Comments: FLAT AFFECT, SLIGHTLY ANXIOUS MOOD       Assessment & Plan:

## 2018-07-08 NOTE — Assessment & Plan Note (Addendum)
SYMPTOMS NOT IDEALLY CONTROLLED.  DRINK WATER TO KEEP YOUR URINE LIGHT YELLOW. FOLLOW A HIGH FIBER DIET. AVOID ITEMS THAT CAUSE BLOATING & GAS.  Re-start SENOKOT. COMPLETE COLONOSCOPY W/ MAC DUE TO POLYPHARMACY/ANXIETY IN 3-4 WEEKS. YOU MAY BRING THE ENEMA TO ADMINISTER IN THE PREOP AREA. DISCUSSED PROCEDURE, BENEFITS, & RISKS: < 1% chance of medication reaction, bleeding, perforation, ASPIRATION, or rupture of spleen/liver requiring surgery to fix it and missed polyps < 1 cm 10-20% of the time. FOLLOW UP IN THE OFFICE WILL BE SCHEDULED IF NEEDED AFTER ENDOSCOPY.

## 2018-07-08 NOTE — Progress Notes (Signed)
CC'D TO PCP °

## 2018-07-08 NOTE — Patient Instructions (Signed)
DRINK WATER TO KEEP YOUR URINE LIGHT YELLOW.  FOLLOW A HIGH FIBER/LACTOSE FREE DIET. AVOID ITEMS THAT CAUSE BLOATING & GAS. SEE INFO BELOW.  Re-start SENOKOT.  IF YOU CONSUME DAIRY, ADD LACTASE 3 PILLS WITH MEALS UP TO THREE TIMES A DAY.   COMPLETE COLONOSCOPY IN 3-4 WEEKS. YOU MAY BRING THE ENEMA TO ADMINISTER IN THE PREOP AREA.  FOLLOW UP IN THE OFFICE WILL BE SCHEDULED IF NEEDED AFTER ENDOSCOPY.   Lactose Free Diet Lactose is a carbohydrate that is found mainly in milk and milk products, as well as in foods with added milk or whey. Lactose must be digested by the enzyme in order to be used by the body. Lactose intolerance occurs when there is a shortage of lactase. When your body is not able to digest lactose, you may feel sick to your stomach (nausea), bloating, cramping, gas and diarrhea.  There are many dairy products that may be tolerated better than milk by some people:  The use of cultured dairy products such as yogurt, buttermilk, cottage cheese, and sweet acidophilus milk (Kefir) for lactase-deficient individuals is usually well tolerated. This is because the healthy bacteria help digest lactose.   Lactose-hydrolyzed milk (Lactaid) contains 40-90% less lactose than milk and may also be well tolerated.    SPECIAL NOTES  Lactose is a carbohydrates. The major food source is dairy products. Reading food labels is important. Many products contain lactose even when they are not made from milk. Look for the following words: whey, milk solids, dry milk solids, nonfat dry milk powder. Typical sources of lactose other than dairy products include breads, candies, cold cuts, prepared and processed foods, and commercial sauces and gravies.   All foods must be prepared without milk, cream, or other dairy foods.   Soy milk and lactose-free supplements (LACTASE) may be used as an alternative to milk.   FOOD GROUP ALLOWED/RECOMMENDED AVOID/USE SPARINGLY  BREADS / STARCHES 4 servings or  more* Breads and rolls made without milk. [place], [place], or [place] bread. Breads and rolls that contain milk. Prepared mixes such as muffins, biscuits, waffles, pancakes. Sweet rolls, donuts, [place] toast (if made with milk or lactose).  Crackers: Soda crackers, graham crackers. Any crackers prepared without lactose. Zwieback crackers, corn curls, or any that contain lactose.  Cereals: Cooked or dry cereals prepared without lactose (read labels). Cooked or dry cereals prepared with lactose (read labels). Total, Cocoa Krispies. Special K.  Potatoes / Pasta / Rice: Any prepared without milk or lactose. Popcorn. Instant potatoes, frozen [place] fries, scalloped or au gratin potatoes.  VEGETABLES 2 servings or more Fresh, frozen, and canned vegetables. Creamed or breaded vegetables. Vegetables in a cheese sauce or with lactose-containing margarines.  FRUIT 2 servings or more All fresh, canned, or frozen fruits that are not processed with lactose. Any canned or frozen fruits processed with lactose.  MEAT & SUBSTITUTES 2 servings or more (4 to 6 oz. total per day) Plain beef, chicken, fish, [place], lamb, veal, pork, or ham. Kosher prepared meat products. Strained or junior meats that do not contain milk. Eggs, soy meat substitutes, nuts. Scrambled eggs, omelets, and souffles that contain milk. Creamed or breaded meat, fish, or fowl. Sausage products such as wieners, liver sausage, or cold cuts that contain milk solids. Cheese, cottage cheese, or cheese spreads.  MILK None. (See "BEVERAGES" for milk substitutes. See "DESSERTS" for ice cream and frozen desserts.) Milk (whole, 2%, skim, or chocolate). Evaporated, powdered, or condensed milk; malted milk.  SOUPS & COMBINATION  FOODS Bouillon, broth, vegetable soups, clear soups, consomms. Homemade soups made with allowed ingredients. Combination or prepared foods that do not contain milk or milk products (read labels). Cream soups, chowders, commercially  prepared soups containing lactose. Macaroni and cheese, pizza. Combination or prepared foods that contain milk or milk products.  DESSERTS & SWEETS In moderation Water and fruit ices; gelatin; angel food cake. Homemade cookies, pies, or cakes made from allowed ingredients. Pudding (if made with water or a milk substitute). Lactose-free tofu desserts. Sugar, honey, corn syrup, jam, jelly; marmalade; molasses (beet sugar); Pure sugar candy; marshmallows. Ice cream, ice milk, sherbet, custard, pudding, frozen yogurt. Commercial cake and cookie mixes. Desserts that contain chocolate. Pie crust made with milk-containing margarine; reduced-calorie desserts made with a sugar substitute that contains lactose. Toffee, peppermint, butterscotch, chocolate, caramels.  FATS & OILS In moderation Butter (as tolerated; contains very small amounts of lactose). Margarines and dressings that do not contain milk, Vegetable oils, shortening, Miracle Whip, mayonnaise, nondairy cream & whipped toppings without lactose or milk solids added (examples: Coffee Rich, Carnation Coffeemate, Rich's Whipped Topping, PolyRich). Berniece Salines. Margarines and salad dressings containing milk; cream, cream cheese; peanut butter with added milk solids, sour cream, chip dips, made with sour cream.  BEVERAGES Carbonated drinks; tea; coffee and freeze-dried coffee; some instant coffees (check labels). Fruit drinks; fruit and vegetable juice; Rice or Soy milk. Ovaltine, hot chocolate. Some cocoas; some instant coffees; instant iced teas; powdered fruit drinks (read labels).   CONDIMENTS / MISCELLANEOUS Soy sauce, carob powder, olives, gravy made with water, baker's cocoa, pickles, pure seasonings and spices, wine, pure monosodium glutamate, catsup, mustard. Some chewing gums, chocolate, some cocoas. Certain antibiotics and vitamin / mineral preparations. Spice blends if they contain milk products. MSG extender. Artificial sweeteners that contain lactose  such as Equal (Nutra-Sweet) and Sweet 'n Low. Some nondairy creamers (read labels).

## 2018-07-08 NOTE — Assessment & Plan Note (Signed)
INTERMITTENT AND DUE TO LACTOSE INTOLERANCE IN A PT WHO CONTINUES TO CONSUME DAIRY.  DRINK WATER TO KEEP YOUR URINE LIGHT YELLOW. FOLLOW A LACTOSE FREE DIET. AVOID ITEMS THAT CAUSE BLOATING & GAS.  HANDOUT GIVEN. IF YOU CONSUME DAIRY, ADD LACTASE 3 PILLS WITH MEALS UP TO THREE TIMES A DAY. FOLLOW UP IN THE OFFICE WILL BE SCHEDULED IF NEEDED AFTER ENDOSCOPY.

## 2018-07-12 ENCOUNTER — Encounter: Payer: Self-pay | Admitting: *Deleted

## 2018-08-17 ENCOUNTER — Ambulatory Visit (INDEPENDENT_AMBULATORY_CARE_PROVIDER_SITE_OTHER): Payer: Medicare Other | Admitting: Orthopaedic Surgery

## 2018-08-17 ENCOUNTER — Other Ambulatory Visit: Payer: Self-pay

## 2018-08-17 ENCOUNTER — Encounter: Payer: Self-pay | Admitting: Orthopaedic Surgery

## 2018-08-17 DIAGNOSIS — M1811 Unilateral primary osteoarthritis of first carpometacarpal joint, right hand: Secondary | ICD-10-CM

## 2018-08-17 NOTE — Progress Notes (Signed)
Office Visit Note   Patient: [name]           Date of Birth: 04-07-1955           MRN: [id number] Visit Date: 08/17/2018              Requested by: [name], MD [place],  [place] [id number] PCP: [name], MD   Assessment & Plan: Visit Diagnoses:  1. Primary osteoarthritis of first carpometacarpal joint of right hand     Plan: Last seen 3 months ago for evaluation of right thumb pain.  Pain seemed to be associated with arthritis at the base of the thumb but did not have a good result with cortisone injection.  Does have splints that she wears.  Has 1 kidney and has to avoid NSAIDs.  I think she might be a good candidate for arthroplasty and will refer to Dr. Fredna Dow.  Follow-Up Instructions: Return if symptoms worsen or fail to improve, for Will refer to Dr. Fredna Dow.   Orders:  No orders of the defined types were placed in this encounter.  No orders of the defined types were placed in this encounter.     Procedures: No procedures performed   Clinical Data: No additional findings.   Subjective: Chief Complaint  Patient presents with  . Right Hand - Follow-up  Patient presents today for a 3 month follow up on her right thumb arthritis. She had a cortisone injection on 05/13/2018. Patient states that the injection she received at her last visit did not help. She cannot open jars or start her lawnmower.  She has limited use of that hand due to pain in her thumb. Shw takes Ibuprofen, Biofreeze, and aspercreme.  Films have demonstrated arthritis at the metacarpal carpal joint.  Wears a splint at home.  Rheumatologic evaluation per her primary care physician with negative results.  HPI  Review of Systems  Constitutional: Negative for fatigue.  HENT: Negative for ear pain.   Eyes: Negative for pain.  Respiratory: Negative for shortness of breath.   Cardiovascular: Negative for leg swelling.  Gastrointestinal: Positive for diarrhea. Negative for  constipation.  Endocrine: Negative for cold intolerance and heat intolerance.  Genitourinary: Negative for difficulty urinating.  Musculoskeletal: Negative for joint swelling.  Skin: Negative for rash.  Allergic/Immunologic: Negative for food allergies.  Neurological: Negative for weakness.  Hematological: Does not bruise/bleed easily.  Psychiatric/Behavioral: Negative for sleep disturbance.     Objective: Vital Signs: BP 134/75   Pulse 67   Ht 5\' 7"  (1.702 m)   Wt 142 lb (64.4 kg)   BMI 22.24 kg/m   Physical Exam Constitutional:      Appearance: She is well-developed.  Eyes:     Pupils: Pupils are equal, round, and reactive to light.  Pulmonary:     Effort: Pulmonary effort is normal.  Skin:    General: Skin is warm and dry.  Neurological:     Mental Status: She is alert and oriented to person, place, and time.  Psychiatric:        Behavior: Behavior normal.     Ortho Exam awake alert and oriented x3.  On examination of right hand there is some prominence dorsally at the base of the thumb I metacarpal carpal joint.  There is a positive grind test and tenderness.  First and second extensor compartments appear to be intact without any pain.  Negative Finkelstein's test.  Skin intact.  Neurologically intact  Specialty Comments:  No specialty comments available.  Imaging: No results found.   PMFS History: Patient Active Problem List   Diagnosis Date Noted  . Osteoarthritis of carpometacarpal [facility]) joint of right thumb 08/17/2018  . Constipation 07/08/2018  . Abdominal pain, left upper quadrant 07/08/2018  . Pain in right wrist 05/13/2018  . Chronic kidney disease 11/06/2015  . 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 Cough variant not due to asthma 05/16/2014  . Essential hypertension, benign 05/16/2014  . Rotator cuff impingement syndrome of right shoulder 04/25/2014  . 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  . Thyroiditis, subacute 12/22/2013  . Right  thyroid nodule 12/22/2013  . Irritability 12/08/2013  . Arthritis 12/08/2013  . Joint pain 10/03/2013  . 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 History of nephrectomy 08/10/2013  . Paresthesia of both hands 08/09/2013  . C O P D 03/05/2007   Past Medical History:  Diagnosis Date  . Allergy   . Anxiety   . Arthritis   . Chronic kidney disease    kidney donor, has only one kidney  . Hypertension   . 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 Migraines   . Thyroid disease    Hx of     Family History  Problem Relation Age of Onset  . Arthritis Mother        rheumatoid  . Hypertension Mother   . Heart disease Mother        Irregular heart beat   . Arrhythmia Mother   . Rheum arthritis Mother   . Pulmonary fibrosis Mother   . Cancer Father        Lung  . COPD Father   . Hypertension Sister   . Aneurysm Brother 11       brain  . Heart disease Maternal Aunt   . Pulmonary fibrosis Maternal Aunt   . Arthritis Maternal Uncle        rheumatoid  . Pulmonary fibrosis Maternal Uncle   . Heart disease Maternal Grandmother   . Diabetes Maternal Uncle   . Hypertension Sister   . Colon cancer Neg Hx   . Colon polyps Neg Hx     Past Surgical History:  Procedure Laterality Date  . ABDOMINAL HYSTERECTOMY  1991   partial  . BLADDER SURGERY     bladder tac, then bladder tack reversal due to adhesions.  . DORSAL COMPARTMENT RELEASE Right 02/07/2013   Procedure: RIGHT WRIST DEQUERVAIN RELEASE;  Surgeon: Jolyn Nap, MD;  Location: [place];  Service: Orthopedics;  Laterality: Right;  . KIDNEY DONATION  2008   gave her lt kidney to daughter  . NECK MASS EXCISION     rt-mass  . NECK SURGERY  1990   rt ? parotidectomy  . SHOULDER ARTHROSCOPY WITH OPEN ROTATOR CUFF REPAIR AND DISTAL CLAVICLE ACROMINECTOMY Right 04/25/2014   Procedure: SHOULDER ARTHROSCOPY WITH MINI-OPEN ROTATOR CUFF REPAIR AND DISTAL CLAVICLE RESECTION;   Surgeon: Garald Balding, MD;  Location: [place];  Service: Orthopedics;  Laterality: Right;  . SHOULDER CLOSED REDUCTION Right 08/31/2014   Procedure: CLOSED MANIPULATION SHOULDER;  Surgeon: Garald Balding, MD;  Location: [place];  Service: Orthopedics;  Laterality: Right;  . SUBACROMIAL DECOMPRESSION Right 04/25/2014   Procedure: SUBACROMIAL DECOMPRESSION;  Surgeon: Garald Balding, MD;  Location: [place];  Service: Orthopedics;  Laterality: Right;  . THYROID SURGERY     bx  Social History   Occupational History    Employer: [employer]    Comment: assembly line  Tobacco Use  . Smoking status: Former Smoker    Packs/day: 0.25    Years: 54.00    Pack years: 13.50    Types: Cigarettes, E-cigarettes    Quit date: 10/25/2014    Years since quitting: 3.8  . Smokeless tobacco: Never Used  Substance and Sexual Activity  . Alcohol use: No  . Drug use: No  . Sexual activity: Not on file

## 2018-08-17 NOTE — Addendum Note (Signed)
Addended by: Lendon Collar on: 08/17/2018 01:30 PM   Modules accepted: Orders

## 2018-08-23 NOTE — Patient Instructions (Signed)
[name]  08/23/2018     @PREFPERIOPPHARMACY @   Your procedure is scheduled on  08/31/2018.  Report to [name] at  1245  P.M.  Call this number if you have problems the morning of surgery:  [phone]   Remember:  Follow the diet and prep instructions given to  You by Dr [name] office.                     Take these medicines the morning of surgery with A SIP OF WATER  Amlodipine, omeprazole, tramadol(if needed).    Do not wear jewelry, make-up or nail polish.  Do not wear lotions, powders, or perfumes, or deodorant.  Do not shave 48 hours prior to surgery.  Men may shave face and neck.  Do not bring valuables to the hospital.  [facility] is not responsible for any belongings or valuables.  Contacts, dentures or bridgework may not be worn into surgery.  Leave your suitcase in the car.  After surgery it may be brought to your room.  For patients admitted to the hospital, discharge time will be determined by your treatment team.  Patients discharged the day of surgery will not be allowed to drive home.   Name and phone number of your driver:   family Special instructions:  None  Please read over the following fact sheets that you were given. Anesthesia Post-op Instructions and Care and Recovery After Surgery       Colonoscopy, Adult, Care After This sheet gives you information about how to care for yourself after your procedure. Your health care provider may also give you more specific instructions. If you have problems or questions, contact your health care provider. What can I expect after the procedure? After the procedure, it is common to have:  A small amount of blood in your stool for 24 hours after the procedure.  Some gas.  Mild abdominal cramping or bloating. Follow these instructions at home: General instructions  For the first 24 hours after the procedure: ? Do not drive or use machinery. ? Do not sign important documents. ? Do not  drink alcohol. ? Do your regular daily activities at a slower pace than normal. ? Eat soft, easy-to-digest foods.  Take over-the-counter or prescription medicines only as told by your health care provider. Relieving cramping and bloating   Try walking around when you have cramps or feel bloated.  Apply heat to your abdomen as told by your health care provider. Use a heat source that your health care provider recommends, such as a moist heat pack or a heating pad. ? Place a towel between your skin and the heat source. ? Leave the heat on for 20-30 minutes. ? Remove the heat if your skin turns bright red. This is especially important if you are unable to feel pain, heat, or cold. You may have a greater risk of getting burned. Eating and drinking   Drink enough fluid to keep your urine pale yellow.  Resume your normal diet as instructed by your health care provider. Avoid heavy or fried foods that are hard to digest.  Avoid drinking alcohol for as long as instructed by your health care provider. Contact a health care provider if:  You have blood in your stool 2-3 days after the procedure. Get help right away if:  You have more than a small spotting of blood in your stool.  You pass large blood clots in your  stool.  Your abdomen is swollen.  You have nausea or vomiting.  You have a fever.  You have increasing abdominal pain that is not relieved with medicine. Summary  After the procedure, it is common to have a small amount of blood in your stool. You may also have mild abdominal cramping and bloating.  For the first 24 hours after the procedure, do not drive or use machinery, sign important documents, or drink alcohol.  Contact your health care provider if you have a lot of blood in your stool, nausea or vomiting, a fever, or increased abdominal pain. This information is not intended to replace advice given to you by your health care provider. Make sure you discuss any  questions you have with your health care provider. Document Released: 08/07/2003 Document Revised: 10/15/2016 Document Reviewed: 03/06/2015 Elsevier Patient Education  2020 [place] After These instructions provide you with information about caring for yourself after your procedure. Your health care provider may also give you more specific instructions. Your treatment has been planned according to current medical practices, but problems sometimes occur. Call your health care provider if you have any problems or questions after your procedure. What can I expect after the procedure? After your procedure, you may:  Feel sleepy for several hours.  Feel clumsy and have poor balance for several hours.  Feel forgetful about what happened after the procedure.  Have poor judgment for several hours.  Feel nauseous or vomit.  Have a sore throat if you had a breathing tube during the procedure. Follow these instructions at home: For at least 24 hours after the procedure:      Have a responsible adult stay with you. It is important to have someone help care for you until you are awake and alert.  Rest as needed.  Do not: ? Participate in activities in which you could fall or become injured. ? Drive. ? Use heavy machinery. ? Drink alcohol. ? Take sleeping pills or medicines that cause drowsiness. ? Make important decisions or sign legal documents. ? Take care of children on your own. Eating and drinking  Follow the diet that is recommended by your health care provider.  If you vomit, drink water, juice, or soup when you can drink without vomiting.  Make sure you have little or no nausea before eating solid foods. General instructions  Take over-the-counter and prescription medicines only as told by your health care provider.  If you have sleep apnea, surgery and certain medicines can increase your risk for breathing problems. Follow instructions  from your health care provider about wearing your sleep device: ? Anytime you are sleeping, including during daytime naps. ? While taking prescription pain medicines, sleeping medicines, or medicines that make you drowsy.  If you smoke, do not smoke without supervision.  Keep all follow-up visits as told by your health care provider. This is important. Contact a health care provider if:  You keep feeling nauseous or you keep vomiting.  You feel light-headed.  You develop a rash.  You have a fever. Get help right away if:  You have trouble breathing. Summary  For several hours after your procedure, you may feel sleepy and have poor judgment.  Have a responsible adult stay with you for at least 24 hours or until you are awake and alert. This information is not intended to replace advice given to you by your health care provider. Make sure you discuss any questions you have with your  health care provider. Document Released: 04/15/2015 Document Revised: 03/23/2017 Document Reviewed: 04/15/2015 Elsevier Patient Education  2020 [employer].

## 2018-08-24 ENCOUNTER — Telehealth: Payer: Self-pay | Admitting: *Deleted

## 2018-08-24 NOTE — Telephone Encounter (Signed)
Pt called to cancel her colonoscopy with propofol.  She doesn't want to have it done right now and did not want to reschedule.  [phone]

## 2018-08-24 NOTE — Telephone Encounter (Signed)
Noted. Called endo and LMOVM procedure cancelled. FYI to Dr. Oneida Alar

## 2018-08-26 ENCOUNTER — Encounter (HOSPITAL_COMMUNITY)
Admission: RE | Admit: 2018-08-26 | Discharge: 2018-08-26 | Disposition: A | Discharge status: Home / Self Care | Payer: Medicare Other | Source: Ambulatory Visit | Attending: Gastroenterology | Admitting: Gastroenterology

## 2018-08-26 ENCOUNTER — Encounter (HOSPITAL_COMMUNITY): Payer: Self-pay

## 2018-08-26 NOTE — Telephone Encounter (Signed)
REVIEWED-PT CAN CALL TO [place].

## 2018-08-31 ENCOUNTER — Ambulatory Visit (HOSPITAL_COMMUNITY): Admission: RE | Admit: 2018-08-31 | Payer: Medicare Other | Source: Home / Self Care | Admitting: Gastroenterology

## 2018-08-31 ENCOUNTER — Encounter (HOSPITAL_COMMUNITY): Admission: RE | Payer: Self-pay | Source: Home / Self Care

## 2018-08-31 SURGERY — COLONOSCOPY WITH PROPOFOL
Anesthesia: Monitor Anesthesia Care

## 2018-09-01 DIAGNOSIS — M1811 Unilateral primary osteoarthritis of first carpometacarpal joint, right hand: Secondary | ICD-10-CM | POA: Diagnosis not present

## 2018-09-01 DIAGNOSIS — M79641 Pain in right hand: Secondary | ICD-10-CM | POA: Diagnosis not present

## 2018-09-27 DIAGNOSIS — M1811 Unilateral primary osteoarthritis of first carpometacarpal joint, right hand: Secondary | ICD-10-CM | POA: Diagnosis not present

## 2018-11-11 DIAGNOSIS — D1722 Benign lipomatous neoplasm of skin and subcutaneous tissue of left arm: Secondary | ICD-10-CM | POA: Diagnosis not present

## 2018-11-11 DIAGNOSIS — D225 Melanocytic nevi of trunk: Secondary | ICD-10-CM | POA: Diagnosis not present

## 2018-11-11 DIAGNOSIS — L821 Other seborrheic keratosis: Secondary | ICD-10-CM | POA: Diagnosis not present

## 2018-11-11 DIAGNOSIS — D235 Other benign neoplasm of skin of trunk: Secondary | ICD-10-CM | POA: Diagnosis not present

## 2018-11-24 DIAGNOSIS — I129 Hypertensive chronic kidney disease with stage 1 through stage 4 chronic kidney disease, or unspecified chronic kidney disease: Secondary | ICD-10-CM | POA: Diagnosis not present

## 2018-11-24 DIAGNOSIS — N1831 Chronic kidney disease, stage 3a: Secondary | ICD-10-CM | POA: Diagnosis not present

## 2018-12-11 DIAGNOSIS — J029 Acute pharyngitis, unspecified: Secondary | ICD-10-CM | POA: Diagnosis not present

## 2018-12-11 DIAGNOSIS — J060 Acute laryngopharyngitis: Secondary | ICD-10-CM | POA: Diagnosis not present

## 2018-12-11 DIAGNOSIS — K219 Gastro-esophageal reflux disease without esophagitis: Secondary | ICD-10-CM | POA: Diagnosis not present

## 2019-01-29 DIAGNOSIS — K219 Gastro-esophageal reflux disease without esophagitis: Secondary | ICD-10-CM | POA: Diagnosis not present

## 2019-01-29 DIAGNOSIS — G43009 Migraine without aura, not intractable, without status migrainosus: Secondary | ICD-10-CM | POA: Diagnosis not present

## 2019-02-07 DIAGNOSIS — H5203 Hypermetropia, bilateral: Secondary | ICD-10-CM | POA: Diagnosis not present

## 2019-02-07 DIAGNOSIS — H40033 Anatomical narrow angle, bilateral: Secondary | ICD-10-CM | POA: Diagnosis not present

## 2019-02-07 DIAGNOSIS — H2513 Age-related nuclear cataract, bilateral: Secondary | ICD-10-CM | POA: Diagnosis not present

## 2019-02-17 DIAGNOSIS — H40033 Anatomical narrow angle, bilateral: Secondary | ICD-10-CM | POA: Diagnosis not present

## 2019-02-17 DIAGNOSIS — H40023 Open angle with borderline findings, high risk, bilateral: Secondary | ICD-10-CM | POA: Diagnosis not present

## 2019-03-17 DIAGNOSIS — H40033 Anatomical narrow angle, bilateral: Secondary | ICD-10-CM | POA: Diagnosis not present

## 2019-03-17 DIAGNOSIS — H40031 Anatomical narrow angle, right eye: Secondary | ICD-10-CM | POA: Diagnosis not present

## 2019-03-19 DIAGNOSIS — M549 Dorsalgia, unspecified: Secondary | ICD-10-CM | POA: Diagnosis not present

## 2019-03-19 DIAGNOSIS — M79602 Pain in left arm: Secondary | ICD-10-CM | POA: Diagnosis not present

## 2019-04-15 ENCOUNTER — Emergency Department (HOSPITAL_COMMUNITY): Payer: Medicare Other

## 2019-04-15 ENCOUNTER — Other Ambulatory Visit: Payer: Self-pay

## 2019-04-15 ENCOUNTER — Emergency Department (HOSPITAL_COMMUNITY)
Admission: EM | Admit: 2019-04-15 | Discharge: 2019-04-15 | Disposition: A | Discharge status: Home / Self Care | Payer: Medicare Other | Attending: Emergency Medicine | Admitting: Emergency Medicine

## 2019-04-15 ENCOUNTER — Encounter (HOSPITAL_COMMUNITY): Payer: Self-pay | Admitting: Emergency Medicine

## 2019-04-15 DIAGNOSIS — W010XXA Fall on same level from slipping, tripping and stumbling without subsequent striking against object, initial encounter: Secondary | ICD-10-CM | POA: Diagnosis not present

## 2019-04-15 DIAGNOSIS — Z87891 Personal history of nicotine dependence: Secondary | ICD-10-CM | POA: Diagnosis not present

## 2019-04-15 DIAGNOSIS — N189 Chronic kidney disease, unspecified: Secondary | ICD-10-CM | POA: Insufficient documentation

## 2019-04-15 DIAGNOSIS — Y92512 Supermarket, store or market as the place of occurrence of the external cause: Secondary | ICD-10-CM | POA: Diagnosis not present

## 2019-04-15 DIAGNOSIS — Y9301 Activity, walking, marching and hiking: Secondary | ICD-10-CM | POA: Diagnosis not present

## 2019-04-15 DIAGNOSIS — S6991XA Unspecified injury of right wrist, hand and finger(s), initial encounter: Secondary | ICD-10-CM | POA: Insufficient documentation

## 2019-04-15 DIAGNOSIS — M1811 Unilateral primary osteoarthritis of first carpometacarpal joint, right hand: Secondary | ICD-10-CM | POA: Diagnosis not present

## 2019-04-15 DIAGNOSIS — Y999 Unspecified external cause status: Secondary | ICD-10-CM | POA: Diagnosis not present

## 2019-04-15 DIAGNOSIS — Z79899 Other long term (current) drug therapy: Secondary | ICD-10-CM | POA: Diagnosis not present

## 2019-04-15 DIAGNOSIS — W19XXXA Unspecified fall, initial encounter: Secondary | ICD-10-CM

## 2019-04-15 DIAGNOSIS — I129 Hypertensive chronic kidney disease with stage 1 through stage 4 chronic kidney disease, or unspecified chronic kidney disease: Secondary | ICD-10-CM | POA: Insufficient documentation

## 2019-04-15 DIAGNOSIS — J449 Chronic obstructive pulmonary disease, unspecified: Secondary | ICD-10-CM | POA: Diagnosis not present

## 2019-04-15 DIAGNOSIS — M25531 Pain in right wrist: Secondary | ICD-10-CM | POA: Diagnosis not present

## 2019-04-15 NOTE — Progress Notes (Signed)
Orthopedic Tech Progress Note Patient Details:  [name] 18-Sep-1955 [id number]  Ortho Devices Type of Ortho Device: Thumb velcro splint Ortho Device/Splint Location: Right Wrist Ortho Device/Splint Interventions: Ordered, Application   Post Interventions Patient Tolerated: Well Instructions Provided: Care of device, Adjustment of device   [name] 04/15/2019, 5:08 PM

## 2019-04-15 NOTE — ED Triage Notes (Signed)
Per pt, states she slipped in some water at [place] herself with her right hand-no deformity/swelling-increased pain

## 2019-04-15 NOTE — ED Provider Notes (Signed)
[facility] DEPT Provider Note   CSN: [id number] Arrival date & time: 04/15/19  1443     History Chief Complaint  Patient presents with  . Hand Pain    [name] LINDSETH is a 64 y.o. female with a history of migraines, hypertension, and COPD who presents to the emergency department status post fall shortly prior to arrival with complaints of right hand/wrist pain. Patient states that she was walking through [place] when she slipped on a wet floor and subsequently fell onto her bottom and onto her right hand. She is unsure the positioning of her hand with the fall. She states that she did not hit her head or lose consciousness. States she is only having pain to the right hand/wrist, constant, worse with movement. No alleviating factors. She has issues with the right wrist/hand at baseline, sees Dr. Fredna Dow for injections, had one earlier today, plan is for carpal tunnel release surgery but she is holding off on this due to need to take care of her grandchild. She is right-hand dominant. Denies numbness, tingling, weakness, headache, neck pain, back pain, chest pain, or abdominal pain. She is not on a blood thinner.  HPI     Past Medical History:  Diagnosis Date  . Allergy   . Anxiety   . Arthritis   . Chronic kidney disease    kidney donor, has only one kidney  . Hypertension   . 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 Migraines   . Thyroid disease    Hx of     Patient Active Problem List   Diagnosis Date Noted  . 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 Constipation 07/08/2018  . Abdominal pain, left upper quadrant 07/08/2018  . Pain in right wrist 05/13/2018  . Chronic kidney disease 11/06/2015  . 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 Cough variant not due to asthma 05/16/2014  . Essential hypertension, benign 05/16/2014  . Rotator cuff impingement syndrome of right shoulder 04/25/2014  .  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  . Thyroiditis, subacute 12/22/2013  . Right thyroid nodule 12/22/2013  . Irritability 12/08/2013  . Arthritis 12/08/2013  . Joint pain 10/03/2013  . 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 History of nephrectomy 08/10/2013  . Paresthesia of both hands 08/09/2013  . C O P D 03/05/2007    Past Surgical History:  Procedure Laterality Date  . ABDOMINAL HYSTERECTOMY  1991   partial  . BLADDER SURGERY     bladder tac, then bladder tack reversal due to adhesions.  . DORSAL COMPARTMENT RELEASE Right 02/07/2013   Procedure: RIGHT WRIST DEQUERVAIN RELEASE;  Surgeon: Jolyn Nap, MD;  Location: [facility];  Service: Orthopedics;  Laterality: Right;  . KIDNEY DONATION  2008   gave her lt kidney to daughter  . NECK MASS EXCISION     rt-mass  . NECK SURGERY  1990   rt ? parotidectomy  . SHOULDER ARTHROSCOPY WITH OPEN ROTATOR CUFF REPAIR AND DISTAL CLAVICLE ACROMINECTOMY Right 04/25/2014   Procedure: SHOULDER ARTHROSCOPY WITH MINI-OPEN ROTATOR CUFF REPAIR AND DISTAL CLAVICLE RESECTION;  Surgeon: Garald Balding, MD;  Location: [facility];  Service: Orthopedics;  Laterality: Right;  . SHOULDER CLOSED REDUCTION Right 08/31/2014   Procedure: CLOSED MANIPULATION SHOULDER;  Surgeon: Garald Balding, MD;  Location: [facility];  Service: Orthopedics;  Laterality: Right;  . SUBACROMIAL DECOMPRESSION Right 04/25/2014  Procedure: SUBACROMIAL DECOMPRESSION;  Surgeon: Garald Balding, MD;  Location: [facility];  Service: Orthopedics;  Laterality: Right;  . THYROID SURGERY     bx     OB History   No obstetric history on file.     Family History  Problem Relation Age of Onset  . Arthritis Mother        rheumatoid  . Hypertension Mother   . Heart disease Mother        Irregular heart beat   . Arrhythmia Mother   . Rheum arthritis Mother   . Pulmonary fibrosis  Mother   . Cancer Father        Lung  . COPD Father   . Hypertension Sister   . Aneurysm Brother 11       brain  . Heart disease Maternal Aunt   . Pulmonary fibrosis Maternal Aunt   . Arthritis Maternal Uncle        rheumatoid  . Pulmonary fibrosis Maternal Uncle   . Heart disease Maternal Grandmother   . Diabetes Maternal Uncle   . Hypertension Sister   . Colon cancer Neg Hx   . Colon polyps Neg Hx     Social History   Tobacco Use  . Smoking status: Former Smoker    Packs/day: 0.25    Years: 54.00    Pack years: 13.50    Types: Cigarettes, E-cigarettes    Quit date: 10/25/2014    Years since quitting: 4.4  . Smokeless tobacco: Never Used  Substance Use Topics  . Alcohol use: No  . Drug use: No    Home Medications Prior to Admission medications   Medication Sig Start Date End Date Taking? Authorizing Provider  amLODipine (NORVASC) 5 MG tablet Take 5 mg by mouth daily.  04/15/17   [provider]  fexofenadine (ALLEGRA) 180 MG tablet Take 180 mg by mouth daily.    [provider]  omeprazole (PRILOSEC) 40 MG capsule Take 40 mg by mouth daily.  06/08/18   [provider]  SUPREP BOWEL PREP KIT 17.5-3.13-1.6 GM/177ML SOLN See admin instructions. 07/08/18   [provider]  traMADol (ULTRAM) 50 MG tablet Take 50 mg by mouth daily as needed for pain. 05/21/18   [provider]  valACYclovir (VALTREX) 500 MG tablet Take 500 mg by mouth See admin instructions. For cold sores take 1 tablet twice a day for 5 days. 03/23/18   [provider]    Allergies    Celebrex [celecoxib], Sulfonamide derivatives, Ace inhibitors, Dilaudid [hydromorphone], Erythromycin, Lactose intolerance (gi), Sudafed [pseudoephedrine hcl], Losartan, and Mucinex [guaifenesin er]  Review of Systems   Review of Systems  Constitutional: Negative for chills and fever.  Respiratory: Negative for shortness of breath.   Cardiovascular: Negative for chest pain.    Gastrointestinal: Negative for abdominal pain.  Musculoskeletal: Positive for arthralgias. Negative for back pain and neck pain.  Skin: Negative for color change and wound.  Neurological: Negative for syncope, weakness, numbness and headaches.    Physical Exam Updated Vital Signs BP (!) 161/93 (BP Location: Left Arm)   Pulse 83   Temp 98.4 F (36.9 C) (Oral)   Resp 16   SpO2 99%   Physical Exam Vitals and nursing note reviewed.  Constitutional:      General: She is not in acute distress.    Appearance: She is well-developed. She is not ill-appearing or toxic-appearing.  HENT:     Head: Normocephalic and atraumatic.  Eyes:  General:        Right eye: No discharge.        Left eye: No discharge.     Conjunctiva/sclera: Conjunctivae normal.  Neck:     Comments: No midline spinal tenderness. Cardiovascular:     Rate and Rhythm: Normal rate and regular rhythm.     Pulses:          Radial pulses are 2+ on the right side and 2+ on the left side.  Pulmonary:     Effort: Pulmonary effort is normal. No respiratory distress.     Breath sounds: Normal breath sounds. No wheezing, rhonchi or rales.  Chest:     Chest wall: No tenderness.  Abdominal:     General: There is no distension.     Palpations: Abdomen is soft.     Tenderness: There is no abdominal tenderness. There is no guarding or rebound.  Musculoskeletal:     Cervical back: Normal range of motion and neck supple.     Comments: Upper extremities: No obvious deformity, appreciable swelling, edema, erythema, ecchymosis, warmth, or open wounds. Patient has intact active range of motion throughout the left upper extremity as well as to the right shoulder, elbow, wrist, and all MCP/IP joints with the exception of the 1st & 2nd- some limitation but able to move some in each direction. RUE: She is tender to palpation to the diffuse wrist, the right anatomical snuffbox, and to the first & second metacarpal, phalanges, and IP/MCP  joints. Otherwise her upper extremities are nontender Back: No midline tenderness or palpable step-off. Lower extremities: Intact active range of motion. No point/focal bony tenderness.  Skin:    General: Skin is warm and dry.     Capillary Refill: Capillary refill takes less than 2 seconds.     Findings: No rash.  Neurological:     Mental Status: She is alert.     Comments: Alert. Clear speech. Sensation grossly intact to bilateral upper extremities.  Grip strength intact.  Patient is ambulatory.  Psychiatric:        Mood and Affect: Mood normal.        Behavior: Behavior normal.    ED Results / Procedures / Treatments   Labs (all labs ordered are listed, but only abnormal results are displayed) Labs Reviewed - No data to display  EKG None  Radiology DG Wrist Complete Right  Result Date: 04/15/2019 CLINICAL DATA:  Wrist pain following fall, initial encounter EXAM: RIGHT WRIST - COMPLETE 3+ VIEW COMPARISON:  None. FINDINGS: Mild degenerative changes of the first [facility] joint are seen. No acute fracture or dislocation is noted. IMPRESSION: Degenerative change without acute abnormality. Electronically Signed   By: Inez Catalina M.D.   On: 04/15/2019 16:12   DG Hand Complete Right  Result Date: 04/15/2019 CLINICAL DATA:  64 year old female with trauma to the right hand. EXAM: RIGHT HAND - COMPLETE 3+ VIEW COMPARISON:  Right wrist radiograph dated 05/13/2018. FINDINGS: There is no acute fracture or dislocation. There is mild osteopenia. There is arthritic changes of the DIP joint of the second and third digits as well as arthritic changes of the base of the thumb. The soft tissues are unremarkable. IMPRESSION: No acute fracture or dislocation. Electronically Signed   By: Anner Crete M.D.   On: 04/15/2019 15:32    Procedures Procedures (including critical care time)  Medications Ordered in ED Medications - No data to display  ED Course  I have reviewed the triage vital signs and  the  nursing notes.  Pertinent labs & imaging results that were available during my care of the patient were reviewed by me and considered in my medical decision making (see chart for details).    MDM Rules/Calculators/A&P                      Patient presents to the ED with complaints of R hand/wrist pain s/p mechanical fall shortly PTA. Nontoxic, resting comfortably, vitals WNL with the exception of elevated BP- doubt HTN emergency.  No signs of serious head, neck, back, or intrathoracic/abdominal injury.  Appears to have isolated injury to the right wrist/hand area.  No open wounds. No signs of infection No obvious deformities. X-rays are without acute fracture or dislocation- personally reviewed & interpreted- agree with radiologist read. NVI distally. Given a degree of tenderness over the anatomical snuffbox will place patient in thumb spica brace- remain in at all times until able to follow up with Dr. Fredna Dow, her hand surgeon she is followed by. She states she will take ibuprofen at home for pain. I discussed results, treatment plan, need for follow-up, and return precautions with the patient. Provided opportunity for questions, patient confirmed understanding and is in agreement with plan.   Final Clinical Impression(s) / ED Diagnoses Final diagnoses:  Fall, initial encounter  Injury of right hand, initial encounter    Rx / DC Orders ED Discharge Orders    None       Amaryllis Dyke, PA-C 04/15/19 1653    Daleen Bo, MD 04/16/19 1321

## 2019-04-15 NOTE — Discharge Instructions (Addendum)
Please read and follow all provided instructions.  You have been seen today for a right hand injury after a fall. Your x-rays did not show any obvious fractures.  Given the area that you are having pain there is concern that she may have what is noticed an occult fracture, this can be missed on the initial x-ray, we have placed you in a hand brace, please keep this on at all times and to you have followed up with Dr. Fredna Dow.  Please follow-up within 3 days.  Home care instructions: -- *PRICE in the first 24-48 hours after injury: Protect (with brace, splint, sling), if given by your provider Rest Ice- Do not apply ice pack directly to your skin, place towel or similar between your skin and ice/ice pack. Apply ice for 20 min, then remove for 40 min while awake Compression- Wear brace, elastic bandage, splint as directed by your provider Elevate affected extremity above the level of your heart when not walking around for the first 24-48 hours   Medications:  Please take motrin per over the counter dosing to help with pain as needed.   Follow-up instructions: Please follow-up with Dr. Tawni Pummel within 3 days for reevaluation.  Return instructions:  Please return if your digits or extremity are numb or tingling, appear gray or blue, or you have severe pain (also elevate the extremity and loosen splint or wrap if you were given one) Please return if you have redness or fevers.  Please return to the Emergency Department if you experience worsening symptoms.  Please return if you have any other emergent concerns. Additional Information:  Your vital signs today were: BP (!) 161/93 (BP Location: Left Arm)   Pulse 83   Temp 98.4 F (36.9 C) (Oral)   Resp 16   SpO2 99%  If your blood pressure (BP) was elevated above 135/85 this visit, please have this repeated by your doctor within one month. ---------------

## 2019-04-18 DIAGNOSIS — R52 Pain, unspecified: Secondary | ICD-10-CM | POA: Diagnosis not present

## 2019-04-29 DIAGNOSIS — M1811 Unilateral primary osteoarthritis of first carpometacarpal joint, right hand: Secondary | ICD-10-CM | POA: Diagnosis not present

## 2019-05-03 DIAGNOSIS — H5203 Hypermetropia, bilateral: Secondary | ICD-10-CM | POA: Diagnosis not present

## 2019-05-03 DIAGNOSIS — H2513 Age-related nuclear cataract, bilateral: Secondary | ICD-10-CM | POA: Diagnosis not present

## 2019-05-30 DIAGNOSIS — J019 Acute sinusitis, unspecified: Secondary | ICD-10-CM | POA: Diagnosis not present

## 2019-09-09 DIAGNOSIS — J069 Acute upper respiratory infection, unspecified: Secondary | ICD-10-CM | POA: Diagnosis not present

## 2019-09-15 DIAGNOSIS — H5310 Unspecified subjective visual disturbances: Secondary | ICD-10-CM | POA: Diagnosis not present

## 2019-09-16 DIAGNOSIS — J0110 Acute frontal sinusitis, unspecified: Secondary | ICD-10-CM | POA: Diagnosis not present

## 2019-09-16 DIAGNOSIS — R439 Unspecified disturbances of smell and taste: Secondary | ICD-10-CM | POA: Diagnosis not present

## 2019-09-16 DIAGNOSIS — R42 Dizziness and giddiness: Secondary | ICD-10-CM | POA: Diagnosis not present

## 2019-09-19 ENCOUNTER — Inpatient Hospital Stay (HOSPITAL_COMMUNITY): Payer: Medicare Other

## 2019-09-19 ENCOUNTER — Emergency Department (HOSPITAL_COMMUNITY): Payer: Medicare Other

## 2019-09-19 ENCOUNTER — Other Ambulatory Visit: Payer: Self-pay

## 2019-09-19 ENCOUNTER — Inpatient Hospital Stay (HOSPITAL_COMMUNITY)
Admission: EM | Admit: 2019-09-19 | Discharge: 2019-09-21 | DRG: 066 | Disposition: A | Discharge status: Skilled Nursing Facility | Payer: Medicare Other | Attending: Family Medicine | Admitting: Family Medicine

## 2019-09-19 ENCOUNTER — Encounter (HOSPITAL_COMMUNITY): Payer: Self-pay | Admitting: Internal Medicine

## 2019-09-19 DIAGNOSIS — Z524 Kidney donor: Secondary | ICD-10-CM | POA: Diagnosis not present

## 2019-09-19 DIAGNOSIS — R404 Transient alteration of awareness: Secondary | ICD-10-CM | POA: Diagnosis not present

## 2019-09-19 DIAGNOSIS — Z20822 Contact with and (suspected) exposure to covid-19: Secondary | ICD-10-CM | POA: Diagnosis present

## 2019-09-19 DIAGNOSIS — G43109 Migraine with aura, not intractable, without status migrainosus: Secondary | ICD-10-CM | POA: Diagnosis not present

## 2019-09-19 DIAGNOSIS — I129 Hypertensive chronic kidney disease with stage 1 through stage 4 chronic kidney disease, or unspecified chronic kidney disease: Secondary | ICD-10-CM | POA: Diagnosis present

## 2019-09-19 DIAGNOSIS — R2689 Other abnormalities of gait and mobility: Secondary | ICD-10-CM | POA: Diagnosis not present

## 2019-09-19 DIAGNOSIS — Z87891 Personal history of nicotine dependence: Secondary | ICD-10-CM | POA: Diagnosis not present

## 2019-09-19 DIAGNOSIS — Z886 Allergy status to analgesic agent status: Secondary | ICD-10-CM | POA: Diagnosis not present

## 2019-09-19 DIAGNOSIS — I69952 Hemiplegia and hemiparesis following unspecified cerebrovascular disease affecting left dominant side: Secondary | ICD-10-CM | POA: Diagnosis not present

## 2019-09-19 DIAGNOSIS — I639 Cerebral infarction, unspecified: Secondary | ICD-10-CM

## 2019-09-19 DIAGNOSIS — Y92009 Unspecified place in unspecified non-institutional (private) residence as the place of occurrence of the external cause: Secondary | ICD-10-CM | POA: Diagnosis not present

## 2019-09-19 DIAGNOSIS — Z885 Allergy status to narcotic agent status: Secondary | ICD-10-CM | POA: Diagnosis not present

## 2019-09-19 DIAGNOSIS — R29702 NIHSS score 2: Secondary | ICD-10-CM | POA: Diagnosis not present

## 2019-09-19 DIAGNOSIS — R2681 Unsteadiness on feet: Secondary | ICD-10-CM | POA: Diagnosis not present

## 2019-09-19 DIAGNOSIS — R9082 White matter disease, unspecified: Secondary | ICD-10-CM | POA: Diagnosis not present

## 2019-09-19 DIAGNOSIS — Z79899 Other long term (current) drug therapy: Secondary | ICD-10-CM

## 2019-09-19 DIAGNOSIS — Z8261 Family history of arthritis: Secondary | ICD-10-CM | POA: Diagnosis not present

## 2019-09-19 DIAGNOSIS — Z90710 Acquired absence of both cervix and uterus: Secondary | ICD-10-CM

## 2019-09-19 DIAGNOSIS — G8314 Monoplegia of lower limb affecting left nondominant side: Secondary | ICD-10-CM | POA: Diagnosis present

## 2019-09-19 DIAGNOSIS — Z8249 Family history of ischemic heart disease and other diseases of the circulatory system: Secondary | ICD-10-CM

## 2019-09-19 DIAGNOSIS — R29703 NIHSS score 3: Secondary | ICD-10-CM | POA: Diagnosis not present

## 2019-09-19 DIAGNOSIS — I671 Cerebral aneurysm, nonruptured: Secondary | ICD-10-CM | POA: Diagnosis not present

## 2019-09-19 DIAGNOSIS — I10 Essential (primary) hypertension: Secondary | ICD-10-CM | POA: Diagnosis not present

## 2019-09-19 DIAGNOSIS — W19XXXA Unspecified fall, initial encounter: Secondary | ICD-10-CM | POA: Diagnosis not present

## 2019-09-19 DIAGNOSIS — G936 Cerebral edema: Secondary | ICD-10-CM | POA: Diagnosis not present

## 2019-09-19 DIAGNOSIS — I6621 Occlusion and stenosis of right posterior cerebral artery: Secondary | ICD-10-CM | POA: Diagnosis not present

## 2019-09-19 DIAGNOSIS — E739 Lactose intolerance, unspecified: Secondary | ICD-10-CM | POA: Diagnosis present

## 2019-09-19 DIAGNOSIS — Z881 Allergy status to other antibiotic agents status: Secondary | ICD-10-CM | POA: Diagnosis not present

## 2019-09-19 DIAGNOSIS — R29704 NIHSS score 4: Secondary | ICD-10-CM | POA: Diagnosis not present

## 2019-09-19 DIAGNOSIS — N1831 Chronic kidney disease, stage 3a: Secondary | ICD-10-CM | POA: Diagnosis not present

## 2019-09-19 DIAGNOSIS — G9389 Other specified disorders of brain: Secondary | ICD-10-CM | POA: Diagnosis not present

## 2019-09-19 DIAGNOSIS — Z882 Allergy status to sulfonamides status: Secondary | ICD-10-CM

## 2019-09-19 DIAGNOSIS — H53462 Homonymous bilateral field defects, left side: Secondary | ICD-10-CM | POA: Diagnosis not present

## 2019-09-19 DIAGNOSIS — M6281 Muscle weakness (generalized): Secondary | ICD-10-CM | POA: Diagnosis not present

## 2019-09-19 DIAGNOSIS — R200 Anesthesia of skin: Secondary | ICD-10-CM | POA: Diagnosis present

## 2019-09-19 DIAGNOSIS — I6602 Occlusion and stenosis of left middle cerebral artery: Secondary | ICD-10-CM | POA: Diagnosis not present

## 2019-09-19 DIAGNOSIS — I6782 Cerebral ischemia: Secondary | ICD-10-CM | POA: Diagnosis not present

## 2019-09-19 DIAGNOSIS — K219 Gastro-esophageal reflux disease without esophagitis: Secondary | ICD-10-CM | POA: Diagnosis not present

## 2019-09-19 DIAGNOSIS — Z905 Acquired absence of kidney: Secondary | ICD-10-CM

## 2019-09-19 DIAGNOSIS — R262 Difficulty in walking, not elsewhere classified: Secondary | ICD-10-CM | POA: Diagnosis not present

## 2019-09-19 DIAGNOSIS — R29709 NIHSS score 9: Secondary | ICD-10-CM | POA: Diagnosis present

## 2019-09-19 DIAGNOSIS — Z743 Need for continuous supervision: Secondary | ICD-10-CM | POA: Diagnosis not present

## 2019-09-19 DIAGNOSIS — I6300 Cerebral infarction due to thrombosis of unspecified precerebral artery: Principal | ICD-10-CM | POA: Diagnosis present

## 2019-09-19 DIAGNOSIS — Z888 Allergy status to other drugs, medicaments and biological substances status: Secondary | ICD-10-CM

## 2019-09-19 DIAGNOSIS — I6389 Other cerebral infarction: Secondary | ICD-10-CM | POA: Diagnosis not present

## 2019-09-19 HISTORY — DX: Cerebral infarction, unspecified: I63.9

## 2019-09-19 LAB — URINALYSIS, ROUTINE W REFLEX MICROSCOPIC
Bacteria, UA: NONE SEEN
Bilirubin Urine: NEGATIVE
Glucose, UA: NEGATIVE mg/dL
Ketones, ur: NEGATIVE mg/dL
Leukocytes,Ua: NEGATIVE
Nitrite: NEGATIVE
Protein, ur: NEGATIVE mg/dL
Specific Gravity, Urine: 1.019 (ref 1.005–1.030)
pH: 5 (ref 5.0–8.0)

## 2019-09-19 LAB — CBG MONITORING, ED: Glucose-Capillary: 127 mg/dL — ABNORMAL HIGH (ref 70–99)

## 2019-09-19 LAB — COMPREHENSIVE METABOLIC PANEL
ALT: 14 U/L (ref 0–44)
AST: 18 U/L (ref 15–41)
Albumin: 4.1 g/dL (ref 3.5–5.0)
Alkaline Phosphatase: 92 U/L (ref 38–126)
Anion gap: 11 (ref 5–15)
BUN: 21 mg/dL (ref 8–23)
CO2: 27 mmol/L (ref 22–32)
Calcium: 9.8 mg/dL (ref 8.9–10.3)
Chloride: 101 mmol/L (ref 98–111)
Creatinine, Ser: 1 mg/dL (ref 0.44–1.00)
GFR calc Af Amer: >60 mL/min (ref >60)
GFR calc non Af Amer: 59 mL/min — ABNORMAL LOW (ref >60)
Glucose, Bld: 121 mg/dL — ABNORMAL HIGH (ref 70–99)
Potassium: 3.9 mmol/L (ref 3.5–5.1)
Sodium: 139 mmol/L (ref 135–145)
Total Bilirubin: 0.6 mg/dL (ref 0.3–1.2)
Total Protein: 7.4 g/dL (ref 6.5–8.1)

## 2019-09-19 LAB — I-STAT CHEM 8, ED
BUN: 21 mg/dL (ref 8–23)
Calcium, Ion: 1.29 mmol/L (ref 1.15–1.40)
Chloride: 102 mmol/L (ref 98–111)
Creatinine, Ser: 1 mg/dL (ref 0.44–1.00)
Glucose, Bld: 120 mg/dL — ABNORMAL HIGH (ref 70–99)
HCT: 42 % (ref 36.0–46.0)
Hemoglobin: 14.3 g/dL (ref 12.0–15.0)
Potassium: 3.9 mmol/L (ref 3.5–5.1)
Sodium: 141 mmol/L (ref 135–145)
TCO2: 29 mmol/L (ref 22–32)

## 2019-09-19 LAB — CBC WITH DIFFERENTIAL/PLATELET
Abs Immature Granulocytes: 0.09 10*3/uL — ABNORMAL HIGH (ref 0.00–0.07)
Basophils Absolute: 0 10*3/uL (ref 0.0–0.1)
Basophils Relative: 0 %
Eosinophils Absolute: 0 10*3/uL (ref 0.0–0.5)
Eosinophils Relative: 0 %
HCT: 45.3 % (ref 36.0–46.0)
Hemoglobin: 14.7 g/dL (ref 12.0–15.0)
Immature Granulocytes: 1 %
Lymphocytes Relative: 8 %
Lymphs Abs: 0.9 10*3/uL (ref 0.7–4.0)
MCH: 29.1 pg (ref 26.0–34.0)
MCHC: 32.5 g/dL (ref 30.0–36.0)
MCV: 89.7 fL (ref 80.0–100.0)
Monocytes Absolute: 0.7 10*3/uL (ref 0.1–1.0)
Monocytes Relative: 6 %
Neutro Abs: 10.4 10*3/uL — ABNORMAL HIGH (ref 1.7–7.7)
Neutrophils Relative %: 85 %
Platelets: 217 10*3/uL (ref 150–400)
RBC: 5.05 MIL/uL (ref 3.87–5.11)
RDW: 11.8 % (ref 11.5–15.5)
WBC: 12.2 10*3/uL — ABNORMAL HIGH (ref 4.0–10.5)
nRBC: 0 % (ref 0.0–0.2)

## 2019-09-19 LAB — HIV ANTIBODY (ROUTINE TESTING W REFLEX): HIV Screen 4th Generation wRfx: NONREACTIVE

## 2019-09-19 LAB — ETHANOL: Alcohol, Ethyl (B): <10 mg/dL (ref <10)

## 2019-09-19 LAB — APTT: aPTT: 23 seconds — ABNORMAL LOW (ref 24–36)

## 2019-09-19 LAB — PROTIME-INR
INR: 0.9 (ref 0.8–1.2)
Prothrombin Time: 12 seconds (ref 11.4–15.2)

## 2019-09-19 LAB — RAPID URINE DRUG SCREEN, HOSP PERFORMED
Amphetamines: NOT DETECTED
Barbiturates: NOT DETECTED
Benzodiazepines: NOT DETECTED
Cocaine: NOT DETECTED
Opiates: NOT DETECTED
Tetrahydrocannabinol: NOT DETECTED

## 2019-09-19 LAB — SARS CORONAVIRUS 2 BY RT PCR (HOSPITAL ORDER, PERFORMED IN ~~LOC~~ HOSPITAL LAB): SARS Coronavirus 2: NEGATIVE

## 2019-09-19 MED ORDER — LABETALOL HCL 5 MG/ML IV SOLN: 10.0000 mg | INTRAVENOUS | Status: DC | PRN

## 2019-09-19 MED ORDER — AZITHROMYCIN 250 MG PO TABS
500.0000 mg | ORAL_TABLET | Freq: Every day | ORAL | Status: DC
  Administered 2019-09-19 – 2019-09-21 (×3): 500 mg via ORAL
  Filled 2019-09-19 (×3): qty 2

## 2019-09-19 MED ORDER — ENOXAPARIN SODIUM 40 MG/0.4ML ~~LOC~~ SOLN
40.0000 mg | SUBCUTANEOUS | Status: DC
  Administered 2019-09-19 – 2019-09-21 (×3): 40 mg via SUBCUTANEOUS
  Filled 2019-09-19 (×4): qty 0.4

## 2019-09-19 MED ORDER — FLUTICASONE PROPIONATE 50 MCG/ACT NA SUSP
1.0000 | Freq: Every day | NASAL | Status: DC
  Administered 2019-09-20 – 2019-09-21 (×2): 1 via NASAL
  Filled 2019-09-19 (×3): qty 16

## 2019-09-19 MED ORDER — IOHEXOL 350 MG/ML SOLN
100.0000 mL | Freq: Once | INTRAVENOUS | Status: AC | PRN
  Administered 2019-09-19: 100 mL via INTRAVENOUS

## 2019-09-19 MED ORDER — ACETAMINOPHEN 160 MG/5ML PO SOLN: 650.0000 mg | ORAL | Status: DC | PRN

## 2019-09-19 MED ORDER — SODIUM CHLORIDE 0.9 % IV BOLUS
500.0000 mL | Freq: Once | INTRAVENOUS | Status: AC
  Administered 2019-09-19: 500 mL via INTRAVENOUS

## 2019-09-19 MED ORDER — SODIUM CHLORIDE 0.9 % IV SOLN: INTRAVENOUS | Status: AC

## 2019-09-19 MED ORDER — LORATADINE 10 MG PO TABS
10.0000 mg | ORAL_TABLET | Freq: Every day | ORAL | Status: DC
  Administered 2019-09-19 – 2019-09-21 (×3): 10 mg via ORAL
  Filled 2019-09-19 (×3): qty 1

## 2019-09-19 MED ORDER — MECLIZINE HCL 12.5 MG PO TABS: 25.0000 mg | ORAL_TABLET | Freq: Three times a day (TID) | ORAL | Status: DC | PRN

## 2019-09-19 MED ORDER — ACETAMINOPHEN 650 MG RE SUPP: 650.0000 mg | RECTAL | Status: DC | PRN

## 2019-09-19 MED ORDER — ACETAMINOPHEN 325 MG PO TABS
650.0000 mg | ORAL_TABLET | ORAL | Status: DC | PRN
  Administered 2019-09-19 – 2019-09-21 (×4): 650 mg via ORAL
  Filled 2019-09-19 (×4): qty 2

## 2019-09-19 MED ORDER — SUMATRIPTAN SUCCINATE 50 MG PO TABS
25.0000 mg | ORAL_TABLET | ORAL | Status: DC | PRN
  Filled 2019-09-19: qty 1

## 2019-09-19 MED ORDER — PANTOPRAZOLE SODIUM 40 MG PO TBEC
40.0000 mg | DELAYED_RELEASE_TABLET | Freq: Every day | ORAL | Status: DC
  Administered 2019-09-19 – 2019-09-21 (×3): 40 mg via ORAL
  Filled 2019-09-19 (×3): qty 1

## 2019-09-19 MED ORDER — ASPIRIN 81 MG PO CHEW
324.0000 mg | CHEWABLE_TABLET | Freq: Once | ORAL | Status: AC
  Administered 2019-09-19: 324 mg via ORAL
  Filled 2019-09-19: qty 4

## 2019-09-19 MED ORDER — STROKE: EARLY STAGES OF RECOVERY BOOK
Freq: Once | Status: AC
  Administered 2019-09-21: 1
  Filled 2019-09-19: qty 1

## 2019-09-19 NOTE — ED Triage Notes (Signed)
REMS brought pt in from home lives alone has had sinus infection and ear infection since Thurs and has had 2 different ABT without results. Pt continues to be nauseas and dizzy, unable to walk due to vertigo. CBG 153 . Pt has had both Covid shots. Pt was tested twice last week for covid and negative. A&O

## 2019-09-19 NOTE — ED Notes (Signed)
Passed BSS

## 2019-09-19 NOTE — Consult Note (Addendum)
D/w neuroradiology R proximal P2 occlusion w poor visualization of core infarct present Pt is not candidate for NIR    Pending official cta results  On independent review nolarge LVO Called neuroradiology how ever unable to reach neurologist D/w ed attg [facility]   Date of Service:   09/19/2019 09:16:59  Impression:     .  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 Also noted in the ER to have left hand numbness and leg weakness associated with a fall. NIHSS 9. Not candidate for alteplase. HCT w early signs of R pca infarct. CTA H and N and P ordered. r/o LVO  Metrics: Last Known Well: Unknown [facility] Notification Time: 09/19/2019 09:16:59 Arrival Time: 09/19/2019 07:04:00 Stamp Time: 09/19/2019 09:16:59 Time First Login Attempt: 09/19/2019 09:25:27 Symptoms: left sided numbness. NIHSS Start Assessment Time: 09/19/2019 09:25:27 Patient is not a candidate for Thrombolytic. Thrombolytic Medical Decision: 09/19/2019 09:39:00 Patient was not deemed candidate for Thrombolytic because of following reasons: Last Well Known Above 4.5 Hours.  CT head was reviewed and results were: early signs of R pca infarct  ED Physician notified of diagnostic impression and management plan on 09/19/2019 09:47:51  Advanced Imaging: CTA Head and Neck ordered  CTP ordered   Our recommendations are outlined below.  Recommendations:     .  Activate Stroke Protocol Admission/Order Set     .  Stroke/Telemetry Floor     .  Neuro Checks     .  Bedside Swallow Eval     .  DVT  Prophylaxis     .  IV Fluids, Normal Saline     .  Head of Bed 30 Degrees     .  Euglycemia and Avoid Hyperthermia (PRN Acetaminophen)     .  Antiplatelet Therapy Recommended     .  Initiate Aspirin 325 MG Daily  Routine Consultation with [facility] for Follow up Care  Sign Out:     .  Discussed with Emergency Department Provider    ------------------------------------------------------------------------------  History of Present Illness: Patient is a 64 year old Female.  Patient was brought by EMS for symptoms of left sided numbness.  64 year old woman with past medical history of hypertension, ckd s/p nephrectomy who last Thu was diagnosed with an ear infection and has been taking antibiotic and meclizine and overt he last 3 days has been having migraines and dizziness which was worsening yesterday and today was even worse. Also noted in the ER to have left hand numbness and leg weakness associated with a fall.  Last seen normal was beyond 4.5 hours of presentation.  Past Medical History:     . Hypertension     . htn, ckd  Social History:Unable to obtain due to Patient Status  Family History:Unable to obtain due to Patient Status  Review of System:  14 Points Review of Systems was performed and was negative except mentioned in HPI.         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 + 2 4: Test Facial Palsy (Use Grimace if Obtunded) - Normal symmetry + 0 5A: Test Left Arm Motor Drift - Drift, hits bed + 2 5B: Test Right Arm Motor Drift - No Drift for 10 Seconds + 0 6A: Test Left Leg Motor Drift - Drift, but doesn't hit bed + 1 6B: Test Right Leg Motor Drift - No Drift for 5 Seconds +  0 7: Test Limb Ataxia (FNF/Heel-Shin) - Ataxia in 1 Limb + 1 8: Test Sensation - Complete Loss: Cannot Sense Being Touched At All + 2 9: Test Language/Aphasia - Normal; No aphasia + 0 10: Test Dysarthria - Normal + 0 11: Test Extinction/Inattention - Visual/tactile/auditory/spatial/personal inattention + 1  NIHSS Score: 9  Pre-Morbid Modified Rankin Scale: 0 Points = No symptoms at all   Patient/Family was informed the Neurology Consult would occur via TeleHealth consult by way of interactive audio and video telecommunications and consented to receiving care in this manner.   Patient is being evaluated for possible acute neurologic impairment and high probability of imminent or life-threatening deterioration. I spent total of 22 minutes providing care to this patient, including time for face to face visit via telemedicine, review of medical records, imaging studies and discussion of findings with providers, the patient and/or family.   Dr Barron Schmid   [facility] [phone]  Case [id number]

## 2019-09-19 NOTE — ED Provider Notes (Signed)
[facility] EMERGENCY DEPARTMENT Provider Note   CSN: [id number] Arrival date & time: 09/19/19  8416  An emergency department physician performed an initial assessment on this suspected stroke patient at 0908.  History Chief Complaint  Patient presents with  . Dizziness    [name] is a 64 y.o. female.  Patient states that she has been having dizziness off and on for couple weeks and she was diagnosed with an inner ear and also ear infection.  This morning she woke up and she had numbness in her left arm and she had fallen a couple times.  The history is provided by the patient and medical records. No language interpreter was used.  Dizziness Quality:  Lightheadedness Severity:  Mild Onset quality:  Sudden Timing:  Constant Progression:  Waxing and waning Chronicity:  New Context: not when bending over   Relieved by:  Nothing Worsened by:  Nothing Ineffective treatments:  None tried Associated symptoms: no chest pain, no diarrhea and no headaches        Past Medical History:  Diagnosis Date  . Allergy   . Anxiety   . Arthritis   . Chronic kidney disease    kidney donor, has only one kidney  . Hypertension   . 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 Migraines   . Thyroid disease    Hx of     Patient Active Problem List   Diagnosis Date Noted  . CVA (cerebral vascular accident) ([place]) 09/19/2019  . Osteoarthritis of carpometacarpal [facility]) joint of right thumb 08/17/2018  . Constipation 07/08/2018  . Abdominal pain, left upper quadrant 07/08/2018  . Pain in right wrist 05/13/2018  . Chronic kidney disease 11/06/2015  . 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 Cough variant not due to asthma 05/16/2014  . Essential hypertension, benign 05/16/2014  . Rotator cuff impingement syndrome of right shoulder 04/25/2014  . 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  . Thyroiditis, subacute 12/22/2013  . Right thyroid nodule 12/22/2013  .  Irritability 12/08/2013  . Arthritis 12/08/2013  . Joint pain 10/03/2013  . 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 History of nephrectomy 08/10/2013  . Paresthesia of both hands 08/09/2013  . C O P D 03/05/2007    Past Surgical History:  Procedure Laterality Date  . ABDOMINAL HYSTERECTOMY  1991   partial  . BLADDER SURGERY     bladder tac, then bladder tack reversal due to adhesions.  . DORSAL COMPARTMENT RELEASE Right 02/07/2013   Procedure: RIGHT WRIST DEQUERVAIN RELEASE;  Surgeon: Jolyn Nap, MD;  Location: [place];  Service: Orthopedics;  Laterality: Right;  . KIDNEY DONATION  2008   gave her lt kidney to daughter  . NECK MASS EXCISION     rt-mass  . NECK SURGERY  1990   rt ? parotidectomy  . SHOULDER ARTHROSCOPY WITH OPEN ROTATOR CUFF REPAIR AND DISTAL CLAVICLE ACROMINECTOMY Right 04/25/2014   Procedure: SHOULDER ARTHROSCOPY WITH MINI-OPEN ROTATOR CUFF REPAIR AND DISTAL CLAVICLE RESECTION;  Surgeon: Garald Balding, MD;  Location: [place];  Service: Orthopedics;  Laterality: Right;  . SHOULDER CLOSED REDUCTION Right 08/31/2014   Procedure: CLOSED MANIPULATION SHOULDER;  Surgeon: Garald Balding, MD;  Location: [place];  Service: Orthopedics;  Laterality: Right;  . SUBACROMIAL DECOMPRESSION Right 04/25/2014   Procedure: SUBACROMIAL DECOMPRESSION;  Surgeon: Garald Balding, MD;  Location: [place];  Service: Orthopedics;  Laterality:  Right;  Marland Kitchen THYROID SURGERY     bx     OB History   No obstetric history on file.     Family History  Problem Relation Age of Onset  . Arthritis Mother        rheumatoid  . Hypertension Mother   . Heart disease Mother        Irregular heart beat   . Arrhythmia Mother   . Rheum arthritis Mother   . Pulmonary fibrosis Mother   . Cancer Father        Lung  . COPD Father   . Hypertension Sister   . Aneurysm Brother 11       brain  .  Heart disease Maternal Aunt   . Pulmonary fibrosis Maternal Aunt   . Arthritis Maternal Uncle        rheumatoid  . Pulmonary fibrosis Maternal Uncle   . Heart disease Maternal Grandmother   . Diabetes Maternal Uncle   . Hypertension Sister   . Colon cancer Neg Hx   . Colon polyps Neg Hx     Social History   Tobacco Use  . Smoking status: Former Smoker    Packs/day: 0.25    Years: 54.00    Pack years: 13.50    Types: Cigarettes, E-cigarettes    Quit date: 10/25/2014    Years since quitting: 4.9  . Smokeless tobacco: Never Used  Vaping Use  . Vaping Use: Every day  Substance Use Topics  . Alcohol use: No  . Drug use: No    Home Medications Prior to Admission medications   Medication Sig Start Date End Date Taking? Authorizing Provider  amLODipine (NORVASC) 5 MG tablet Take 5 mg by mouth daily.  04/15/17  Yes [provider]  fexofenadine (ALLEGRA) 180 MG tablet Take 180 mg by mouth daily as needed for allergies.    Yes [provider]  meclizine (ANTIVERT) 25 MG tablet Take 25 mg by mouth 3 (three) times daily as needed for dizziness. 09/16/19  Yes [provider]  omeprazole (PRILOSEC) 40 MG capsule Take 40 mg by mouth daily as needed (acid reflux).  06/08/18  Yes [provider]  traMADol (ULTRAM) 50 MG tablet Take 50 mg by mouth daily as needed for pain. 05/21/18  Yes [provider]    Allergies    Celebrex [celecoxib], Sulfonamide derivatives, Ace inhibitors, Dilaudid [hydromorphone], Erythromycin, Lactose intolerance (gi), Sudafed [pseudoephedrine hcl], Losartan, and Mucinex [guaifenesin er]  Review of Systems   Review of Systems  Constitutional: Negative for appetite change and fatigue.  HENT: Negative for congestion, ear discharge and sinus pressure.   Eyes: Negative for discharge.  Respiratory: Negative for cough.   Cardiovascular: Negative for chest pain.  Gastrointestinal: Negative for abdominal pain and diarrhea.    Genitourinary: Negative for frequency and hematuria.  Musculoskeletal: Negative for back pain.  Skin: Negative for rash.  Neurological: Positive for dizziness. Negative for seizures and headaches.  Psychiatric/Behavioral: Negative for hallucinations.    Physical Exam Updated Vital Signs BP (!) 141/81   Pulse 84   Temp 97.8 F (36.6 C) (Oral)   Resp 11   SpO2 100%   Physical Exam Vitals and nursing note reviewed.  Constitutional:      Appearance: She is well-developed.  HENT:     Head: Normocephalic.     Nose: Nose normal.  Eyes:     General: No scleral icterus.    Conjunctiva/sclera: Conjunctivae normal.  Neck:  Thyroid: No thyromegaly.  Cardiovascular:     Rate and Rhythm: Normal rate and regular rhythm.     Heart sounds: No murmur heard.  No friction rub. No gallop.   Pulmonary:     Breath sounds: No stridor. No wheezing or rales.  Chest:     Chest wall: No tenderness.  Abdominal:     General: There is no distension.     Tenderness: There is no abdominal tenderness. There is no rebound.  Musculoskeletal:        General: Normal range of motion.     Cervical back: Neck supple.  Lymphadenopathy:     Cervical: No cervical adenopathy.  Skin:    Findings: No erythema or rash.     Comments: Patient has weakness in the left arm with poor coordination and also poor coordination of the left leg and she is unable to walk without being unsteady  Neurological:     Mental Status: She is alert and oriented to person, place, and time.     Motor: No abnormal muscle tone.     Coordination: Coordination normal.  Psychiatric:        Behavior: Behavior normal.     ED Results / Procedures / Treatments   Labs (all labs ordered are listed, but only abnormal results are displayed) Labs Reviewed  CBC WITH DIFFERENTIAL/PLATELET - Abnormal; Notable for the following components:      Result Value   WBC 12.2 (*)    Neutro Abs 10.4 (*)    Abs Immature Granulocytes 0.09 (*)     All other components within normal limits  COMPREHENSIVE METABOLIC PANEL - Abnormal; Notable for the following components:   Glucose, Bld 121 (*)    GFR calc non Af Amer 59 (*)    All other components within normal limits  APTT - Abnormal; Notable for the following components:   aPTT 23 (*)    All other components within normal limits  URINALYSIS, ROUTINE W REFLEX MICROSCOPIC - Abnormal; Notable for the following components:   Hgb urine dipstick SMALL (*)    All other components within normal limits  I-STAT CHEM 8, ED - Abnormal; Notable for the following components:   Glucose, Bld 120 (*)    All other components within normal limits  CBG MONITORING, ED - Abnormal; Notable for the following components:   Glucose-Capillary 127 (*)    All other components within normal limits  SARS CORONAVIRUS 2 BY RT PCR (HOSPITAL ORDER, [facility])  ETHANOL  PROTIME-INR  RAPID URINE DRUG SCREEN, HOSP PERFORMED    EKG None  Radiology CT CEREBRAL PERFUSION W CONTRAST  Result Date: 09/19/2019 CLINICAL DATA:  Left hand numbness. EXAM: CT ANGIOGRAPHY HEAD AND NECK CT PERFUSION BRAIN TECHNIQUE: Multidetector CT imaging of the head and neck was performed using the standard protocol during bolus administration of intravenous contrast. Multiplanar CT image reconstructions and MIPs were obtained to evaluate the vascular anatomy. Carotid stenosis measurements (when applicable) are obtained utilizing NASCET criteria, using the distal internal carotid diameter as the denominator. Multiphase CT imaging of the brain was performed following IV bolus contrast injection. Subsequent parametric perfusion maps were calculated using RAPID software. CONTRAST:  130mL OMNIPAQUE IOHEXOL 350 MG/ML SOLN COMPARISON:  None. FINDINGS: CTA NECK FINDINGS Aortic arch: Incomplete imaging of the aortic arch with exclusion of the brachiocephalic artery origin. Wide patency of the included portions of the  brachiocephalic and subclavian arteries. Right carotid system: Patent without evidence of stenosis, dissection,  or significant atherosclerosis. Left carotid system: Patent without evidence of stenosis or dissection. Minimal calcified plaque at the carotid bifurcation. Vertebral arteries: Patent and codominant without evidence of stenosis or dissection. Skeleton: Mild cervical disc and facet degeneration. Other neck: 1 cm right thyroid nodule, considered clinically insignificant and for which no follow-up imaging is recommended. Prior right submandibular gland resection. Upper chest: Mild centrilobular emphysema and biapical pleuroparenchymal scarring. Review of the MIP images confirms the above findings CTA HEAD FINDINGS Anterior circulation: The internal carotid arteries are widely patent from skull base to carotid termini. There is a 2 mm aneurysm projecting laterally from the proximal left cavernous ICA. ACAs and MCAs are patent without evidence of a proximal branch occlusion or significant proximal stenosis. Posterior circulation: The intracranial vertebral arteries are widely patent to the basilar. Patent PICA and SCA origins are identified bilaterally. The basilar artery is widely patent. There is a moderate-sized right posterior communicating artery. The right PCA is occluded beginning at the proximal P2 segment. The left PCA is patent without evidence of a significant proximal stenosis. No aneurysm is identified. Venous sinuses: Patent. Anatomic variants: None. Review of the MIP images confirms the above findings CT Brain Perfusion Findings: CBF (<30%) Volume: 0 mL Perfusion (Tmax>6.0s) volume: 4 mL Mismatch Volume: 4 mL Infarction Location: The right PCA infarct visible on earlier noncontrast CT is not detected by automated CTP processing. The potential 4 mL penumbra is located in the mesial right temporal lobe. IMPRESSION: 1. Proximal right P2 occlusion. 2. CTP results as detailed above with at most a  small penumbra. 3. 2 mm left cavernous ICA aneurysm. 4. Widely patent carotid and vertebral arteries. 5.  Emphysema (ICD10-J43.9). These results were called by telephone at the time of interpretation on 09/19/2019 at 11:20 am to Dr. Roderic Palau, who verbally acknowledged these results. Electronically Signed   By: Logan Bores M.D.   On: 09/19/2019 11:52   CT HEAD CODE STROKE WO CONTRAST  Result Date: 09/19/2019 CLINICAL DATA:  Code stroke.  Left hand numbness upon awakening. EXAM: CT HEAD WITHOUT CONTRAST TECHNIQUE: Contiguous axial images were obtained from the base of the skull through the vertex without intravenous contrast. COMPARISON:  05/01/2003 FINDINGS: Brain: Cytotoxic edema appearance in the right occipital lobe affecting cortex. Infarct also seen at the lateral right thalamus. Patchy low-density in the cerebral white matter. No acute hemorrhage, hydrocephalus, or collection. Partially calcified right parafalcine nodule at the vertex measuring 1 cm, increased in size from 2005. Vascular: No hyperdense vessel. Skull: Negative Sinuses/Orbits: Negative Other: These results were called by telephone at the time of interpretation on 09/19/2019 at 9:25 am to provider [facility] Salvador [place] , who verbally acknowledged these results. IMPRESSION: 1. Acute or early subacute infarct in the right PCA distribution affecting occipital cortex and the lateral right thalamus. 2. Background chronic small vessel ischemia. 3. 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 Electronically Signed   By: Monte Fantasia M.D.   On: 09/19/2019 09:27   CT ANGIO HEAD CODE STROKE  Result Date: 09/19/2019 CLINICAL DATA:  Left hand numbness. EXAM: CT ANGIOGRAPHY HEAD AND NECK CT PERFUSION BRAIN TECHNIQUE: Multidetector CT imaging of the head and neck was performed using the standard protocol during bolus administration of intravenous contrast. Multiplanar CT image reconstructions and MIPs were  obtained to evaluate the vascular anatomy. Carotid stenosis measurements (when applicable) are obtained utilizing NASCET criteria, using the distal internal carotid diameter as the denominator. Multiphase CT imaging of  the brain was performed following IV bolus contrast injection. Subsequent parametric perfusion maps were calculated using RAPID software. CONTRAST:  129mL OMNIPAQUE IOHEXOL 350 MG/ML SOLN COMPARISON:  None. FINDINGS: CTA NECK FINDINGS Aortic arch: Incomplete imaging of the aortic arch with exclusion of the brachiocephalic artery origin. Wide patency of the included portions of the brachiocephalic and subclavian arteries. Right carotid system: Patent without evidence of stenosis, dissection, or significant atherosclerosis. Left carotid system: Patent without evidence of stenosis or dissection. Minimal calcified plaque at the carotid bifurcation. Vertebral arteries: Patent and codominant without evidence of stenosis or dissection. Skeleton: Mild cervical disc and facet degeneration. Other neck: 1 cm right thyroid nodule, considered clinically insignificant and for which no follow-up imaging is recommended. Prior right submandibular gland resection. Upper chest: Mild centrilobular emphysema and biapical pleuroparenchymal scarring. Review of the MIP images confirms the above findings CTA HEAD FINDINGS Anterior circulation: The internal carotid arteries are widely patent from skull base to carotid termini. There is a 2 mm aneurysm projecting laterally from the proximal left cavernous ICA. ACAs and MCAs are patent without evidence of a proximal branch occlusion or significant proximal stenosis. Posterior circulation: The intracranial vertebral arteries are widely patent to the basilar. Patent PICA and SCA origins are identified bilaterally. The basilar artery is widely patent. There is a moderate-sized right posterior communicating artery. The right PCA is occluded beginning at the proximal P2 segment. The  left PCA is patent without evidence of a significant proximal stenosis. No aneurysm is identified. Venous sinuses: Patent. Anatomic variants: None. Review of the MIP images confirms the above findings CT Brain Perfusion Findings: CBF (<30%) Volume: 0 mL Perfusion (Tmax>6.0s) volume: 4 mL Mismatch Volume: 4 mL Infarction Location: The right PCA infarct visible on earlier noncontrast CT is not detected by automated CTP processing. The potential 4 mL penumbra is located in the mesial right temporal lobe. IMPRESSION: 1. Proximal right P2 occlusion. 2. CTP results as detailed above with at most a small penumbra. 3. 2 mm left cavernous ICA aneurysm. 4. Widely patent carotid and vertebral arteries. 5.  Emphysema (ICD10-J43.9). These results were called by telephone at the time of interpretation on 09/19/2019 at 11:20 am to Dr. Roderic Palau, who verbally acknowledged these results. Electronically Signed   By: Logan Bores M.D.   On: 09/19/2019 11:52   CT ANGIO NECK CODE STROKE  Result Date: 09/19/2019 CLINICAL DATA:  Left hand numbness. EXAM: CT ANGIOGRAPHY HEAD AND NECK CT PERFUSION BRAIN TECHNIQUE: Multidetector CT imaging of the head and neck was performed using the standard protocol during bolus administration of intravenous contrast. Multiplanar CT image reconstructions and MIPs were obtained to evaluate the vascular anatomy. Carotid stenosis measurements (when applicable) are obtained utilizing NASCET criteria, using the distal internal carotid diameter as the denominator. Multiphase CT imaging of the brain was performed following IV bolus contrast injection. Subsequent parametric perfusion maps were calculated using RAPID software. CONTRAST:  136mL OMNIPAQUE IOHEXOL 350 MG/ML SOLN COMPARISON:  None. FINDINGS: CTA NECK FINDINGS Aortic arch: Incomplete imaging of the aortic arch with exclusion of the brachiocephalic artery origin. Wide patency of the included portions of the brachiocephalic and subclavian arteries. Right  carotid system: Patent without evidence of stenosis, dissection, or significant atherosclerosis. Left carotid system: Patent without evidence of stenosis or dissection. Minimal calcified plaque at the carotid bifurcation. Vertebral arteries: Patent and codominant without evidence of stenosis or dissection. Skeleton: Mild cervical disc and facet degeneration. Other neck: 1 cm right thyroid nodule, considered clinically insignificant and for which  no follow-up imaging is recommended. Prior right submandibular gland resection. Upper chest: Mild centrilobular emphysema and biapical pleuroparenchymal scarring. Review of the MIP images confirms the above findings CTA HEAD FINDINGS Anterior circulation: The internal carotid arteries are widely patent from skull base to carotid termini. There is a 2 mm aneurysm projecting laterally from the proximal left cavernous ICA. ACAs and MCAs are patent without evidence of a proximal branch occlusion or significant proximal stenosis. Posterior circulation: The intracranial vertebral arteries are widely patent to the basilar. Patent PICA and SCA origins are identified bilaterally. The basilar artery is widely patent. There is a moderate-sized right posterior communicating artery. The right PCA is occluded beginning at the proximal P2 segment. The left PCA is patent without evidence of a significant proximal stenosis. No aneurysm is identified. Venous sinuses: Patent. Anatomic variants: None. Review of the MIP images confirms the above findings CT Brain Perfusion Findings: CBF (<30%) Volume: 0 mL Perfusion (Tmax>6.0s) volume: 4 mL Mismatch Volume: 4 mL Infarction Location: The right PCA infarct visible on earlier noncontrast CT is not detected by automated CTP processing. The potential 4 mL penumbra is located in the mesial right temporal lobe. IMPRESSION: 1. Proximal right P2 occlusion. 2. CTP results as detailed above with at most a small penumbra. 3. 2 mm left cavernous ICA  aneurysm. 4. Widely patent carotid and vertebral arteries. 5.  Emphysema (ICD10-J43.9). These results were called by telephone at the time of interpretation on 09/19/2019 at 11:20 am to Dr. Roderic Palau, who verbally acknowledged these results. Electronically Signed   By: Logan Bores M.D.   On: 09/19/2019 11:52    Procedures Procedures (including critical care time)  Medications Ordered in ED Medications  iohexol (OMNIPAQUE) 350 MG/ML injection 100 mL (100 mLs Intravenous Contrast Given 09/19/19 1031)  aspirin chewable tablet 324 mg (324 mg Oral Given 09/19/19 1251)  sodium chloride 0.9 % bolus 500 mL (500 mLs Intravenous New Bag/Given 09/19/19 1251)    ED Course  I have reviewed the triage vital signs and the nursing notes.  Pertinent labs & imaging results that were available during my care of the patient were reviewed by me and considered in my medical decision making (see chart for details).   ,edcr CRITICAL CARE Performed by: Milton Ferguson Total critical care time: 45 minutes Critical care time was exclusive of separately billable procedures and treating other patients. Critical care was necessary to treat or prevent imminent or life-threatening deterioration. Critical care was time spent personally by me on the following activities: development of treatment plan with patient and/or surrogate as well as nursing, discussions with consultants, evaluation of patient's response to treatment, examination of patient, obtaining history from patient or surrogate, ordering and performing treatments and interventions, ordering and review of laboratory studies, ordering and review of radiographic studies, pulse oximetry and re-evaluation of patient's condition.  MDM Rules/Calculators/A&P                          Has had a stroke.  Neurology has been consulted and it is too late to give thrombolytics also patient does not have a large vessel disease that IR needs to be consulted about.  She will be put  on aspirin antiplatelet and admitted for stroke work-up      This patient presents to the ED for concern of weakness, this involves an extensive number of treatment options, and is a complaint that carries with it a high risk of complications and morbidity.  The differential diagnosis includes stroke   Lab Tests:  I Ordered, reviewed, and interpreted labs, which included CBC and chemistries which showed elevated white count Medicines ordered:   I ordered medication aspirin for stroke  Imaging Studies ordered:   I ordered imaging studies which included CT head  I independently visualized and interpreted imaging which showed stroke  Additional history obtained:   Additional history obtained from records  Previous records obtained and reviewed.  Consultations Obtained:   I consulted neurologist and hospitalist and discussed lab and imaging findings  Reevaluation:  After the interventions stated above, I reevaluated the patient and found no change  Critical Interventions:  .   Final Clinical Impression(s) / ED Diagnoses Final diagnoses:  Cerebrovascular accident (CVA), unspecified mechanism ([place])    Rx / [place] Orders ED Discharge Orders    None       Milton Ferguson, MD 09/19/19 1321

## 2019-09-19 NOTE — ED Notes (Signed)
Patient denies pain and is resting comfortably.  

## 2019-09-19 NOTE — ED Notes (Signed)
Family at beside  

## 2019-09-19 NOTE — ED Notes (Signed)
Transported to CT 

## 2019-09-19 NOTE — H&P (Signed)
History and Physical    FIDELIS LOTH XNA:[id number] DOB: 06-13-1955 DOA: 09/19/2019  PCP: [name], MD   Patient coming from: Home  Chief Complaint: L hand numbness/dizziness  HPI: [name] is a 64 y.o. female with medical history significant for CKD stage IIIa with prior left nephrectomy, hypertension, and GERD who was recently being treated for an ear infection as well as associated vertigo with antibiotics and meclizine over the last 3 days.  She states that she took some doxycycline for couple days, but this irritated her stomach so she stopped.  She has been experiencing some migraines as well as dizziness which have worsened today.  She apparently woke up earlier today with left hand numbness as well as left leg weakness and had 2 falls at home as a result.  She denies any speech deficit or visual deficits.   ED Course: Vital signs stable and patient afebrile.  Mild leukocytosis of 12,000 noted and creatinine is stable at 1.0.  Telemetry neurology consultation obtained with right proximal P2 occlusion noted and no LVO.  Recommendations noted for full dose aspirin in consultation with neurology.  Covid testing negative.  She is currently complaining of a migraine headache.  Review of Systems: All others reviewed and otherwise negative.  Past Medical History:  Diagnosis Date  . Allergy   . Anxiety   . Arthritis   . Chronic kidney disease    kidney donor, has only one kidney  . Hypertension   . 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 Migraines   . Thyroid disease    Hx of     Past Surgical History:  Procedure Laterality Date  . ABDOMINAL HYSTERECTOMY  1991   partial  . BLADDER SURGERY     bladder tac, then bladder tack reversal due to adhesions.  . DORSAL COMPARTMENT RELEASE Right 02/07/2013   Procedure: RIGHT WRIST DEQUERVAIN RELEASE;  Surgeon: Jolyn Nap, MD;  Location: [facility];  Service: Orthopedics;  Laterality:  Right;  . KIDNEY DONATION  2008   gave her lt kidney to daughter  . NECK MASS EXCISION     rt-mass  . NECK SURGERY  1990   rt ? parotidectomy  . SHOULDER ARTHROSCOPY WITH OPEN ROTATOR CUFF REPAIR AND DISTAL CLAVICLE ACROMINECTOMY Right 04/25/2014   Procedure: SHOULDER ARTHROSCOPY WITH MINI-OPEN ROTATOR CUFF REPAIR AND DISTAL CLAVICLE RESECTION;  Surgeon: Garald Balding, MD;  Location: [facility];  Service: Orthopedics;  Laterality: Right;  . SHOULDER CLOSED REDUCTION Right 08/31/2014   Procedure: CLOSED MANIPULATION SHOULDER;  Surgeon: Garald Balding, MD;  Location: [place];  Service: Orthopedics;  Laterality: Right;  . SUBACROMIAL DECOMPRESSION Right 04/25/2014   Procedure: SUBACROMIAL DECOMPRESSION;  Surgeon: Garald Balding, MD;  Location: [facility];  Service: Orthopedics;  Laterality: Right;  . THYROID SURGERY     bx     reports that she quit smoking about 4 years ago. Her smoking use included cigarettes and e-cigarettes. She has a 13.50 pack-year smoking history. She has never used smokeless tobacco. She reports that she does not drink alcohol and does not use drugs.  Allergies  Allergen Reactions  . Celebrex [Celecoxib] Anaphylaxis and Hives  . Sulfonamide Derivatives Anaphylaxis, Swelling and Rash  . Ace Inhibitors Cough  . Dilaudid [Hydromorphone] Nausea And Vomiting    Severe N/V  . Erythromycin Nausea And Vomiting  . Lactose Intolerance (Gi) Diarrhea  cramping  . Sudafed [Pseudoephedrine Hcl]     Makes "high"  . Losartan Palpitations  . Mucinex [Guaifenesin Er] Palpitations    Family History  Problem Relation Age of Onset  . Arthritis Mother        rheumatoid  . Hypertension Mother   . Heart disease Mother        Irregular heart beat   . Arrhythmia Mother   . Rheum arthritis Mother   . Pulmonary fibrosis Mother   . Cancer Father        Lung  . COPD Father   . Hypertension Sister   . Aneurysm Brother 11         brain  . Heart disease Maternal Aunt   . Pulmonary fibrosis Maternal Aunt   . Arthritis Maternal Uncle        rheumatoid  . Pulmonary fibrosis Maternal Uncle   . Heart disease Maternal Grandmother   . Diabetes Maternal Uncle   . Hypertension Sister   . Colon cancer Neg Hx   . Colon polyps Neg Hx     Prior to Admission medications   Medication Sig Start Date End Date Taking? Authorizing Provider  amLODipine (NORVASC) 5 MG tablet Take 5 mg by mouth daily.  04/15/17  Yes [provider]  fexofenadine (ALLEGRA) 180 MG tablet Take 180 mg by mouth daily as needed for allergies.    Yes [provider]  meclizine (ANTIVERT) 25 MG tablet Take 25 mg by mouth 3 (three) times daily as needed for dizziness. 09/16/19  Yes [provider]  omeprazole (PRILOSEC) 40 MG capsule Take 40 mg by mouth daily as needed (acid reflux).  06/08/18  Yes [provider]  traMADol (ULTRAM) 50 MG tablet Take 50 mg by mouth daily as needed for pain. 05/21/18  Yes [provider]    Physical Exam: Vitals:   09/19/19 0930 09/19/19 1030 09/19/19 1130 09/19/19 1200  BP: (!) 155/81 (!) 167/59 (!) 143/64 (!) 141/81  Pulse: 84     Resp: 14 17 13 11   Temp:      TempSrc:      SpO2: 100%       Constitutional: NAD, calm, comfortable Vitals:   09/19/19 0930 09/19/19 1030 09/19/19 1130 09/19/19 1200  BP: (!) 155/81 (!) 167/59 (!) 143/64 (!) 141/81  Pulse: 84     Resp: 14 17 13 11   Temp:      TempSrc:      SpO2: 100%      Eyes: lids and conjunctivae normal ENMT: Mucous membranes are moist.  Neck: normal, supple Respiratory: clear to auscultation bilaterally. Normal respiratory effort. No accessory muscle use.  Cardiovascular: Regular rate and rhythm, no murmurs. No extremity edema. Abdomen: no tenderness, no distention. Bowel sounds positive.  Musculoskeletal:  No joint deformity upper and lower extremities.   Skin: no rashes, lesions, ulcers.  Psychiatric: Normal  judgment and insight. Alert and oriented x 3. Normal mood.   Labs on Admission: I have personally reviewed following labs and imaging studies  CBC: Recent Labs  Lab 09/19/19 0856 09/19/19 0923  WBC 12.2*  --   NEUTROABS 10.4*  --   HGB 14.7 14.3  HCT 45.3 42.0  MCV 89.7  --   PLT 217  --    Basic Metabolic Panel: Recent Labs  Lab 09/19/19 0856 09/19/19 0923  NA 139 141  K 3.9 3.9  CL 101 102  CO2 27  --   GLUCOSE 121* 120*  BUN  21 21  CREATININE 1.00 1.00  CALCIUM 9.8  --    GFR: CrCl cannot be calculated (Unknown ideal weight.). Liver Function Tests: Recent Labs  Lab 09/19/19 0856  AST 18  ALT 14  ALKPHOS 92  BILITOT 0.6  PROT 7.4  ALBUMIN 4.1   No results for input(s): LIPASE, AMYLASE in the last 168 hours. No results for input(s): AMMONIA in the last 168 hours. Coagulation Profile: Recent Labs  Lab 09/19/19 0910  INR 0.9   Cardiac Enzymes: No results for input(s): CKTOTAL, CKMB, CKMBINDEX, TROPONINI in the last 168 hours. BNP (last 3 results) No results for input(s): PROBNP in the last 8760 hours. HbA1C: No results for input(s): HGBA1C in the last 72 hours. CBG: Recent Labs  Lab 09/19/19 0921  GLUCAP 127*   Lipid Profile: No results for input(s): CHOL, HDL, LDLCALC, TRIG, CHOLHDL, LDLDIRECT in the last 72 hours. Thyroid Function Tests: No results for input(s): TSH, T4TOTAL, FREET4, T3FREE, THYROIDAB in the last 72 hours. Anemia Panel: No results for input(s): VITAMINB12, FOLATE, FERRITIN, TIBC, IRON, RETICCTPCT in the last 72 hours. Urine analysis:    Component Value Date/Time   COLORURINE YELLOW 09/19/2019 0910   APPEARANCEUR CLEAR 09/19/2019 0910   LABSPEC 1.019 09/19/2019 0910   PHURINE 5.0 09/19/2019 0910   GLUCOSEU NEGATIVE 09/19/2019 0910   GLUCOSEU NEGATIVE 08/09/2013 0942   HGBUR SMALL (A) 09/19/2019 0910   BILIRUBINUR NEGATIVE 09/19/2019 0910   BILIRUBINUR small 08/07/2016 1406   KETONESUR NEGATIVE 09/19/2019 0910    PROTEINUR NEGATIVE 09/19/2019 0910   UROBILINOGEN 1.0 08/07/2016 1406   UROBILINOGEN 0.2 08/09/2013 0942   NITRITE NEGATIVE 09/19/2019 0910   LEUKOCYTESUR NEGATIVE 09/19/2019 0910    Radiological Exams on Admission: CT CEREBRAL PERFUSION W CONTRAST  Result Date: 09/19/2019 CLINICAL DATA:  Left hand numbness. EXAM: CT ANGIOGRAPHY HEAD AND NECK CT PERFUSION BRAIN TECHNIQUE: Multidetector CT imaging of the head and neck was performed using the standard protocol during bolus administration of intravenous contrast. Multiplanar CT image reconstructions and MIPs were obtained to evaluate the vascular anatomy. Carotid stenosis measurements (when applicable) are obtained utilizing NASCET criteria, using the distal internal carotid diameter as the denominator. Multiphase CT imaging of the brain was performed following IV bolus contrast injection. Subsequent parametric perfusion maps were calculated using RAPID software. CONTRAST:  127mL OMNIPAQUE IOHEXOL 350 MG/ML SOLN COMPARISON:  None. FINDINGS: CTA NECK FINDINGS Aortic arch: Incomplete imaging of the aortic arch with exclusion of the brachiocephalic artery origin. Wide patency of the included portions of the brachiocephalic and subclavian arteries. Right carotid system: Patent without evidence of stenosis, dissection, or significant atherosclerosis. Left carotid system: Patent without evidence of stenosis or dissection. Minimal calcified plaque at the carotid bifurcation. Vertebral arteries: Patent and codominant without evidence of stenosis or dissection. Skeleton: Mild cervical disc and facet degeneration. Other neck: 1 cm right thyroid nodule, considered clinically insignificant and for which no follow-up imaging is recommended. Prior right submandibular gland resection. Upper chest: Mild centrilobular emphysema and biapical pleuroparenchymal scarring. Review of the MIP images confirms the above findings CTA HEAD FINDINGS Anterior circulation: The internal  carotid arteries are widely patent from skull base to carotid termini. There is a 2 mm aneurysm projecting laterally from the proximal left cavernous ICA. ACAs and MCAs are patent without evidence of a proximal branch occlusion or significant proximal stenosis. Posterior circulation: The intracranial vertebral arteries are widely patent to the basilar. Patent PICA and SCA origins are identified bilaterally. The basilar artery is widely patent. There  is a moderate-sized right posterior communicating artery. The right PCA is occluded beginning at the proximal P2 segment. The left PCA is patent without evidence of a significant proximal stenosis. No aneurysm is identified. Venous sinuses: Patent. Anatomic variants: None. Review of the MIP images confirms the above findings CT Brain Perfusion Findings: CBF (<30%) Volume: 0 mL Perfusion (Tmax>6.0s) volume: 4 mL Mismatch Volume: 4 mL Infarction Location: The right PCA infarct visible on earlier noncontrast CT is not detected by automated CTP processing. The potential 4 mL penumbra is located in the mesial right temporal lobe. IMPRESSION: 1. Proximal right P2 occlusion. 2. CTP results as detailed above with at most a small penumbra. 3. 2 mm left cavernous ICA aneurysm. 4. Widely patent carotid and vertebral arteries. 5.  Emphysema (ICD10-J43.9). These results were called by telephone at the time of interpretation on 09/19/2019 at 11:20 am to Dr. Roderic Palau, who verbally acknowledged these results. Electronically Signed   By: Logan Bores M.D.   On: 09/19/2019 11:52   CT HEAD CODE STROKE WO CONTRAST  Result Date: 09/19/2019 CLINICAL DATA:  Code stroke.  Left hand numbness upon awakening. EXAM: CT HEAD WITHOUT CONTRAST TECHNIQUE: Contiguous axial images were obtained from the base of the skull through the vertex without intravenous contrast. COMPARISON:  05/01/2003 FINDINGS: Brain: Cytotoxic edema appearance in the right occipital lobe affecting cortex. Infarct also seen at  the lateral right thalamus. Patchy low-density in the cerebral white matter. No acute hemorrhage, hydrocephalus, or collection. Partially calcified right parafalcine nodule at the vertex measuring 1 cm, increased in size from 2005. Vascular: No hyperdense vessel. Skull: Negative Sinuses/Orbits: Negative Other: These results were called by telephone at the time of interpretation on 09/19/2019 at 9:25 am to provider [facility] , who verbally acknowledged these results. IMPRESSION: 1. Acute or early subacute infarct in the right PCA distribution affecting occipital cortex and the lateral right thalamus. 2. Background chronic small vessel ischemia. 3. 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 Electronically Signed   By: Monte Fantasia M.D.   On: 09/19/2019 09:27   CT ANGIO HEAD CODE STROKE  Result Date: 09/19/2019 CLINICAL DATA:  Left hand numbness. EXAM: CT ANGIOGRAPHY HEAD AND NECK CT PERFUSION BRAIN TECHNIQUE: Multidetector CT imaging of the head and neck was performed using the standard protocol during bolus administration of intravenous contrast. Multiplanar CT image reconstructions and MIPs were obtained to evaluate the vascular anatomy. Carotid stenosis measurements (when applicable) are obtained utilizing NASCET criteria, using the distal internal carotid diameter as the denominator. Multiphase CT imaging of the brain was performed following IV bolus contrast injection. Subsequent parametric perfusion maps were calculated using RAPID software. CONTRAST:  13mL OMNIPAQUE IOHEXOL 350 MG/ML SOLN COMPARISON:  None. FINDINGS: CTA NECK FINDINGS Aortic arch: Incomplete imaging of the aortic arch with exclusion of the brachiocephalic artery origin. Wide patency of the included portions of the brachiocephalic and subclavian arteries. Right carotid system: Patent without evidence of stenosis, dissection, or significant atherosclerosis. Left carotid system:  Patent without evidence of stenosis or dissection. Minimal calcified plaque at the carotid bifurcation. Vertebral arteries: Patent and codominant without evidence of stenosis or dissection. Skeleton: Mild cervical disc and facet degeneration. Other neck: 1 cm right thyroid nodule, considered clinically insignificant and for which no follow-up imaging is recommended. Prior right submandibular gland resection. Upper chest: Mild centrilobular emphysema and biapical pleuroparenchymal scarring. Review of the MIP images confirms the above findings CTA HEAD FINDINGS Anterior circulation: The  internal carotid arteries are widely patent from skull base to carotid termini. There is a 2 mm aneurysm projecting laterally from the proximal left cavernous ICA. ACAs and MCAs are patent without evidence of a proximal branch occlusion or significant proximal stenosis. Posterior circulation: The intracranial vertebral arteries are widely patent to the basilar. Patent PICA and SCA origins are identified bilaterally. The basilar artery is widely patent. There is a moderate-sized right posterior communicating artery. The right PCA is occluded beginning at the proximal P2 segment. The left PCA is patent without evidence of a significant proximal stenosis. No aneurysm is identified. Venous sinuses: Patent. Anatomic variants: None. Review of the MIP images confirms the above findings CT Brain Perfusion Findings: CBF (<30%) Volume: 0 mL Perfusion (Tmax>6.0s) volume: 4 mL Mismatch Volume: 4 mL Infarction Location: The right PCA infarct visible on earlier noncontrast CT is not detected by automated CTP processing. The potential 4 mL penumbra is located in the mesial right temporal lobe. IMPRESSION: 1. Proximal right P2 occlusion. 2. CTP results as detailed above with at most a small penumbra. 3. 2 mm left cavernous ICA aneurysm. 4. Widely patent carotid and vertebral arteries. 5.  Emphysema (ICD10-J43.9). These results were called by telephone  at the time of interpretation on 09/19/2019 at 11:20 am to Dr. Roderic Palau, who verbally acknowledged these results. Electronically Signed   By: Logan Bores M.D.   On: 09/19/2019 11:52   CT ANGIO NECK CODE STROKE  Result Date: 09/19/2019 CLINICAL DATA:  Left hand numbness. EXAM: CT ANGIOGRAPHY HEAD AND NECK CT PERFUSION BRAIN TECHNIQUE: Multidetector CT imaging of the head and neck was performed using the standard protocol during bolus administration of intravenous contrast. Multiplanar CT image reconstructions and MIPs were obtained to evaluate the vascular anatomy. Carotid stenosis measurements (when applicable) are obtained utilizing NASCET criteria, using the distal internal carotid diameter as the denominator. Multiphase CT imaging of the brain was performed following IV bolus contrast injection. Subsequent parametric perfusion maps were calculated using RAPID software. CONTRAST:  150mL OMNIPAQUE IOHEXOL 350 MG/ML SOLN COMPARISON:  None. FINDINGS: CTA NECK FINDINGS Aortic arch: Incomplete imaging of the aortic arch with exclusion of the brachiocephalic artery origin. Wide patency of the included portions of the brachiocephalic and subclavian arteries. Right carotid system: Patent without evidence of stenosis, dissection, or significant atherosclerosis. Left carotid system: Patent without evidence of stenosis or dissection. Minimal calcified plaque at the carotid bifurcation. Vertebral arteries: Patent and codominant without evidence of stenosis or dissection. Skeleton: Mild cervical disc and facet degeneration. Other neck: 1 cm right thyroid nodule, considered clinically insignificant and for which no follow-up imaging is recommended. Prior right submandibular gland resection. Upper chest: Mild centrilobular emphysema and biapical pleuroparenchymal scarring. Review of the MIP images confirms the above findings CTA HEAD FINDINGS Anterior circulation: The internal carotid arteries are widely patent from skull base  to carotid termini. There is a 2 mm aneurysm projecting laterally from the proximal left cavernous ICA. ACAs and MCAs are patent without evidence of a proximal branch occlusion or significant proximal stenosis. Posterior circulation: The intracranial vertebral arteries are widely patent to the basilar. Patent PICA and SCA origins are identified bilaterally. The basilar artery is widely patent. There is a moderate-sized right posterior communicating artery. The right PCA is occluded beginning at the proximal P2 segment. The left PCA is patent without evidence of a significant proximal stenosis. No aneurysm is identified. Venous sinuses: Patent. Anatomic variants: None. Review of the MIP images confirms the above findings CT Brain  Perfusion Findings: CBF (<30%) Volume: 0 mL Perfusion (Tmax>6.0s) volume: 4 mL Mismatch Volume: 4 mL Infarction Location: The right PCA infarct visible on earlier noncontrast CT is not detected by automated CTP processing. The potential 4 mL penumbra is located in the mesial right temporal lobe. IMPRESSION: 1. Proximal right P2 occlusion. 2. CTP results as detailed above with at most a small penumbra. 3. 2 mm left cavernous ICA aneurysm. 4. Widely patent carotid and vertebral arteries. 5.  Emphysema (ICD10-J43.9). These results were called by telephone at the time of interpretation on 09/19/2019 at 11:20 am to Dr. Roderic Palau, who verbally acknowledged these results. Electronically Signed   By: Logan Bores M.D.   On: 09/19/2019 11:52    Assessment/Plan Active Problems:   CVA (cerebral vascular accident) ([place])    Acute CVA with proximal P2 occlusion -Obtain brain MRI -Obtain 2D echocardiogram -PT/OT/SLP evaluation -Appreciate neurology evaluation -Full dose aspirin to be given in ED, continue aspirin -Check hemoglobin A1c and lipid panel -Gentle, IV normal saline  Recent ear/sinus infection -With associated leukocytosis -Plan to keep on azithromycin for now -Flonase  Migraine  headache -Imitrex  History of hypertension -Hold home antihypertensives and allow permissive hypertension  History of GERD -PPI  History of CKD stage IIIa -With noted prior nephrectomy with donation to daughter -Appears at baseline -Continue to monitor with repeat labs   DVT prophylaxis: Lovenox Code Status: Full Family Communication: None at bedside, daughter aware Disposition Plan:Admit for CVA evaluation Consults called:Neurology Admission status: Inpatient, Tele   Sarya Linenberger D Manuella Ghazi DO [facility]  If 7PM-7AM, please contact night-coverage [email or web address]  09/19/2019, 12:48 PM

## 2019-09-19 NOTE — ED Notes (Signed)
Pt reports left hand numbness when she woke up. Lkw 1230 am. Pt report that she had a migraine all day and slept most of day . Dizzy all day yesterday. Reports numbness to left hand. Pt has hematoma to left hand reports falling goingto BR this morning due to dizziness

## 2019-09-19 NOTE — ED Notes (Signed)
Transported to radiology 

## 2019-09-20 ENCOUNTER — Inpatient Hospital Stay (HOSPITAL_COMMUNITY): Payer: Medicare Other

## 2019-09-20 DIAGNOSIS — I69952 Hemiplegia and hemiparesis following unspecified cerebrovascular disease affecting left dominant side: Secondary | ICD-10-CM | POA: Diagnosis not present

## 2019-09-20 DIAGNOSIS — I6389 Other cerebral infarction: Secondary | ICD-10-CM

## 2019-09-20 DIAGNOSIS — W19XXXA Unspecified fall, initial encounter: Secondary | ICD-10-CM | POA: Diagnosis not present

## 2019-09-20 LAB — CBC
HCT: 42.7 % (ref 36.0–46.0)
Hemoglobin: 13.9 g/dL (ref 12.0–15.0)
MCH: 29.6 pg (ref 26.0–34.0)
MCHC: 32.6 g/dL (ref 30.0–36.0)
MCV: 90.9 fL (ref 80.0–100.0)
Platelets: 200 10*3/uL (ref 150–400)
RBC: 4.7 MIL/uL (ref 3.87–5.11)
RDW: 11.9 % (ref 11.5–15.5)
WBC: 9.7 10*3/uL (ref 4.0–10.5)
nRBC: 0 % (ref 0.0–0.2)

## 2019-09-20 LAB — BASIC METABOLIC PANEL
Anion gap: 10 (ref 5–15)
BUN: 17 mg/dL (ref 8–23)
CO2: 25 mmol/L (ref 22–32)
Calcium: 9.3 mg/dL (ref 8.9–10.3)
Chloride: 104 mmol/L (ref 98–111)
Creatinine, Ser: 1.05 mg/dL — ABNORMAL HIGH (ref 0.44–1.00)
GFR calc Af Amer: >60 mL/min (ref >60)
GFR calc non Af Amer: 56 mL/min — ABNORMAL LOW (ref >60)
Glucose, Bld: 97 mg/dL (ref 70–99)
Potassium: 3.8 mmol/L (ref 3.5–5.1)
Sodium: 139 mmol/L (ref 135–145)

## 2019-09-20 LAB — HEMOGLOBIN A1C
Hgb A1c MFr Bld: 5.3 % (ref 4.8–5.6)
Mean Plasma Glucose: 105.41 mg/dL

## 2019-09-20 LAB — ECHOCARDIOGRAM COMPLETE
AR max vel: 1.75 cm2
AV Area VTI: 1.42 cm2
AV Area mean vel: 1.42 cm2
AV Mean grad: 5.3 mmHg
AV Peak grad: 9 mmHg
Ao pk vel: 1.5 m/s
Area-P 1/2: 4.74 cm2
Height: 67 in
S' Lateral: 2.88 cm
Weight: 2272 oz

## 2019-09-20 LAB — LIPID PANEL
Cholesterol: 237 mg/dL — ABNORMAL HIGH (ref 0–200)
HDL: 68 mg/dL (ref >40)
LDL Cholesterol: 153 mg/dL — ABNORMAL HIGH (ref 0–99)
Total CHOL/HDL Ratio: 3.5 RATIO
Triglycerides: 82 mg/dL (ref <150)
VLDL: 16 mg/dL (ref 0–40)

## 2019-09-20 LAB — MAGNESIUM: Magnesium: 2.1 mg/dL (ref 1.7–2.4)

## 2019-09-20 LAB — GLUCOSE, CAPILLARY: Glucose-Capillary: 91 mg/dL (ref 70–99)

## 2019-09-20 MED ORDER — ASPIRIN EC 81 MG PO TBEC
81.0000 mg | DELAYED_RELEASE_TABLET | Freq: Every day | ORAL | Status: DC
  Administered 2019-09-20 – 2019-09-21 (×2): 81 mg via ORAL
  Filled 2019-09-20 (×2): qty 1

## 2019-09-20 MED ORDER — CLOPIDOGREL BISULFATE 75 MG PO TABS
75.0000 mg | ORAL_TABLET | Freq: Every day | ORAL | Status: DC
  Administered 2019-09-20 – 2019-09-21 (×2): 75 mg via ORAL
  Filled 2019-09-20 (×3): qty 1

## 2019-09-20 MED ORDER — SIMVASTATIN 20 MG PO TABS
40.0000 mg | ORAL_TABLET | Freq: Every day | ORAL | Status: DC
  Administered 2019-09-20: 40 mg via ORAL
  Filled 2019-09-20: qty 2

## 2019-09-20 MED ORDER — CLOPIDOGREL BISULFATE 75 MG PO TABS: 75.0000 mg | ORAL_TABLET | Freq: Every day | ORAL | Status: DC

## 2019-09-20 NOTE — Progress Notes (Signed)
PROGRESS NOTE    [name]  ONG:[id number] DOB: 08/26/1955 DOA: 09/19/2019 PCP: [name], MD   Brief Narrative:  Per HPI: [name] is a 64 y.o. female with medical history significant for CKD stage IIIa with prior left nephrectomy, hypertension, and GERD who was recently being treated for an ear infection as well as associated vertigo with antibiotics and meclizine over the last 3 days.  She states that she took some doxycycline for couple days, but this irritated her stomach so she stopped.  She has been experiencing some migraines as well as dizziness which have worsened today.  She apparently woke up earlier today with left hand numbness as well as left leg weakness and had 2 falls at home as a result.  She denies any speech deficit or visual deficits.  9/14: Patient has been admitted with acute right CVA involving distribution of the posterior cerebral artery.  Neurology has recommended dual antiplatelet therapy for 3 months and then single agent as well as statin.  Patient has been seen by physical therapy with recommendations for SNF and she continues to have ongoing weakness.  Awaiting SNF bed.  Assessment & Plan:   Active Problems:   CVA (cerebral vascular accident) ([place])   Acute CVA with proximal P2 occlusion -Brain MRI confirmed CVA -2D echocardiogram with LVEF 60-65% and indeterminate diastolic function -PT evaluation with recommendation for SNF -Appreciate neurology evaluation with recommendation for dual antiplatelet therapy for 3 months and then single agent as well as statin -Continue on aspirin and Plavix -Simvastatin 40 mg at bedtime initiated -Check hemoglobin A1c 5.3% and LDL 153  Recent ear/sinus infection -With associated leukocytosis-resolved -Plan to keep on azithromycin for now -Flonase  Migraine headache-improved -Imitrex as needed  History of hypertension -Hold home antihypertensives and allow permissive hypertension  History of  GERD -PPI  History of CKD stage IIIa -With noted prior nephrectomy with donation to daughter -Appears at baseline -Continue to monitor with repeat labs   DVT prophylaxis:Lovenox Code Status: Full Family Communication: Discussed with son on phone 9/14 Disposition Plan:  Status is: Inpatient  Remains inpatient appropriate because:Inpatient level of care appropriate due to severity of illness   Dispo: The patient is from: Home              Anticipated d/c is to: SNF              Anticipated d/c date is: 1 day              Patient currently is medically stable to d/c.May DC when SNF bed available.  Consultants:   Neurology  Procedures:   See below  Antimicrobials:   None   Subjective: Patient seen and evaluated today with no new acute complaints or concerns. No acute concerns or events noted overnight.  She continues to have ongoing weakness.  Objective: Vitals:   09/20/19 0500 09/20/19 1103 09/20/19 1307 09/20/19 1334  BP: (!) 150/77 (!) 154/83 (!) 143/78 (!) 147/74  Pulse: 70 76 77 77  Resp: 14 16 16    Temp:  98.2 F (36.8 C) 98.1 F (36.7 C) (!) 97.4 F (36.3 C)  TempSrc:   Oral Oral  SpO2: 98% 98% 95% 98%  Weight:      Height:       No intake or output data in the 24 hours ending 09/20/19 1539 Filed Weights   09/19/19 1700  Weight: 64.4 kg    Examination:  General exam: Appears calm and comfortable  Respiratory system: Clear to auscultation. Respiratory effort normal. Cardiovascular system: S1 & S2 heard, RRR.  Gastrointestinal system: Abdomen is nondistended, soft and nontender.  Central nervous system: Alert and awake. Extremities: No edema Skin: No rashes, lesions or ulcers Psychiatry: Flat affect    Data Reviewed: I have personally reviewed following labs and imaging studies  CBC: Recent Labs  Lab 09/19/19 0856 09/19/19 0923 09/20/19 0426  WBC 12.2*  --  9.7  NEUTROABS 10.4*  --   --   HGB 14.7 14.3 13.9  HCT 45.3 42.0 42.7   MCV 89.7  --  90.9  PLT 217  --  098   Basic Metabolic Panel: Recent Labs  Lab 09/19/19 0856 09/19/19 0923 09/20/19 0426  NA 139 141 139  K 3.9 3.9 3.8  CL 101 102 104  CO2 27  --  25  GLUCOSE 121* 120* 97  BUN 21 21 17   CREATININE 1.00 1.00 1.05*  CALCIUM 9.8  --  9.3  MG  --   --  2.1   GFR: Estimated Creatinine Clearance: 51.9 mL/min (A) (by C-G formula based on SCr of 1.05 mg/dL (H)). Liver Function Tests: Recent Labs  Lab 09/19/19 0856  AST 18  ALT 14  ALKPHOS 92  BILITOT 0.6  PROT 7.4  ALBUMIN 4.1   No results for input(s): LIPASE, AMYLASE in the last 168 hours. No results for input(s): AMMONIA in the last 168 hours. Coagulation Profile: Recent Labs  Lab 09/19/19 0910  INR 0.9   Cardiac Enzymes: No results for input(s): CKTOTAL, CKMB, CKMBINDEX, TROPONINI in the last 168 hours. BNP (last 3 results) No results for input(s): PROBNP in the last 8760 hours. HbA1C: Recent Labs    09/19/19 0856  HGBA1C 5.3   CBG: Recent Labs  Lab 09/19/19 0921  GLUCAP 127*   Lipid Profile: Recent Labs    09/20/19 0426  CHOL 237*  HDL 68  LDLCALC 153*  TRIG 82  CHOLHDL 3.5   Thyroid Function Tests: No results for input(s): TSH, T4TOTAL, FREET4, T3FREE, THYROIDAB in the last 72 hours. Anemia Panel: No results for input(s): VITAMINB12, FOLATE, FERRITIN, TIBC, IRON, RETICCTPCT in the last 72 hours. Sepsis Labs: No results for input(s): PROCALCITON, LATICACIDVEN in the last 168 hours.  Recent Results (from the past 240 hour(s))  SARS Coronavirus 2 by RT PCR (hospital order, performed in [facility] hospital lab) Nasopharyngeal Nasopharyngeal Swab     Status: None   Collection Time: 09/19/19  9:22 AM   Specimen: Nasopharyngeal Swab  Result Value Ref Range Status   SARS Coronavirus 2 NEGATIVE NEGATIVE Final    Comment: (NOTE) SARS-CoV-2 target nucleic acids are NOT DETECTED.  The SARS-CoV-2 RNA is generally detectable in upper and lower respiratory  specimens during the acute phase of infection. The lowest concentration of SARS-CoV-2 viral copies this assay can detect is 250 copies / mL. A negative result does not preclude SARS-CoV-2 infection and should not be used as the sole basis for treatment or other patient management decisions.  A negative result may occur with improper specimen collection / handling, submission of specimen other than nasopharyngeal swab, presence of viral mutation(s) within the areas targeted by this assay, and inadequate number of viral copies (<250 copies / mL). A negative result must be combined with clinical observations, patient history, and epidemiological information.  Fact Sheet for Patients:   StrictlyIdeas.no  Fact Sheet for Healthcare Providers: BankingDealers.co.za  This test is not yet approved or  cleared by the [place]  States FDA and has been authorized for detection and/or diagnosis of SARS-CoV-2 by FDA under an Emergency Use Authorization (EUA).  This EUA will remain in effect (meaning this test can be used) for the duration of the COVID-19 declaration under Section 564(b)(1) of the Act, 21 U.S.C. section 360bbb-3(b)(1), unless the authorization is terminated or revoked sooner.  Performed at [facility], [address], [place], [place] [address]          Radiology Studies: MR BRAIN WO CONTRAST  Result Date: 09/19/2019 CLINICAL DATA:  64 year old female code stroke presentation, proximal right PCA P2 occlusion on CTA today. EXAM: MRI HEAD WITHOUT CONTRAST TECHNIQUE: Multiplanar, multiecho pulse sequences of the brain and surrounding structures were obtained without intravenous contrast. COMPARISON:  CTA and CTP, plain head CT earlier today. FINDINGS: Brain: Confluent restricted diffusion tracking from the right occipital pole through the medial right occipital lobe to the posterior mesial right temporal lobe, including involvement of the  right hippocampal formation. Patchy, confluent involvement also of the right thalamus. And there are a few additional scattered smaller foci of restricted diffusion in the inferior right parietal lobe, and also at the splenium of the corpus callosum (series 5, image 21). No posterior fossa or left hemisphere restricted diffusion. T2 and FLAIR hyperintense cytotoxic edema at the areas of acute involvement. No evidence of acute hemorrhage. No intracranial mass effect. No chronic cerebral blood products identified. Mild to moderate for age additional scattered nonspecific bilateral cerebral white matter T2 and FLAIR hyperintensity. No chronic cortical encephalomalacia identified. No midline shift, mass effect, evidence of mass lesion, ventriculomegaly, extra-axial collection or acute intracranial hemorrhage. Cervicomedullary junction and pituitary are within normal limits. Vascular: Major intracranial vascular flow voids are preserved. Skull and upper cervical spine: Negative visible cervical spine. Visualized bone marrow signal is within normal limits. Sinuses/Orbits: Negative. Other: Mastoids remain clear. Visible internal auditory structures appear normal. Scalp and face appear negative. IMPRESSION: 1. Fairly widely scattered acute infarcts in the Right PCA territory. Involvement in the medial right occipital lobe, occipital pole, but also involving the right hippocampal formation, right thalamus, splenium. 2. No associated hemorrhage or mass effect. 3. No other acute intracranial abnormality. Mild to moderate for age underlying cerebral white matter signal changes. Electronically Signed   By: Genevie Ann M.D.   On: 09/19/2019 15:29   CT CEREBRAL PERFUSION W CONTRAST  Result Date: 09/19/2019 CLINICAL DATA:  Left hand numbness. EXAM: CT ANGIOGRAPHY HEAD AND NECK CT PERFUSION BRAIN TECHNIQUE: Multidetector CT imaging of the head and neck was performed using the standard protocol during bolus administration of  intravenous contrast. Multiplanar CT image reconstructions and MIPs were obtained to evaluate the vascular anatomy. Carotid stenosis measurements (when applicable) are obtained utilizing NASCET criteria, using the distal internal carotid diameter as the denominator. Multiphase CT imaging of the brain was performed following IV bolus contrast injection. Subsequent parametric perfusion maps were calculated using RAPID software. CONTRAST:  140mL OMNIPAQUE IOHEXOL 350 MG/ML SOLN COMPARISON:  None. FINDINGS: CTA NECK FINDINGS Aortic arch: Incomplete imaging of the aortic arch with exclusion of the brachiocephalic artery origin. Wide patency of the included portions of the brachiocephalic and subclavian arteries. Right carotid system: Patent without evidence of stenosis, dissection, or significant atherosclerosis. Left carotid system: Patent without evidence of stenosis or dissection. Minimal calcified plaque at the carotid bifurcation. Vertebral arteries: Patent and codominant without evidence of stenosis or dissection. Skeleton: Mild cervical disc and facet degeneration. Other neck: 1 cm right thyroid nodule, considered clinically insignificant and  for which no follow-up imaging is recommended. Prior right submandibular gland resection. Upper chest: Mild centrilobular emphysema and biapical pleuroparenchymal scarring. Review of the MIP images confirms the above findings CTA HEAD FINDINGS Anterior circulation: The internal carotid arteries are widely patent from skull base to carotid termini. There is a 2 mm aneurysm projecting laterally from the proximal left cavernous ICA. ACAs and MCAs are patent without evidence of a proximal branch occlusion or significant proximal stenosis. Posterior circulation: The intracranial vertebral arteries are widely patent to the basilar. Patent PICA and SCA origins are identified bilaterally. The basilar artery is widely patent. There is a moderate-sized right posterior communicating  artery. The right PCA is occluded beginning at the proximal P2 segment. The left PCA is patent without evidence of a significant proximal stenosis. No aneurysm is identified. Venous sinuses: Patent. Anatomic variants: None. Review of the MIP images confirms the above findings CT Brain Perfusion Findings: CBF (<30%) Volume: 0 mL Perfusion (Tmax>6.0s) volume: 4 mL Mismatch Volume: 4 mL Infarction Location: The right PCA infarct visible on earlier noncontrast CT is not detected by automated CTP processing. The potential 4 mL penumbra is located in the mesial right temporal lobe. IMPRESSION: 1. Proximal right P2 occlusion. 2. CTP results as detailed above with at most a small penumbra. 3. 2 mm left cavernous ICA aneurysm. 4. Widely patent carotid and vertebral arteries. 5.  Emphysema (ICD10-J43.9). These results were called by telephone at the time of interpretation on 09/19/2019 at 11:20 am to Dr. Roderic Palau, who verbally acknowledged these results. Electronically Signed   By: Logan Bores M.D.   On: 09/19/2019 11:52   ECHOCARDIOGRAM COMPLETE  Result Date: 09/20/2019    ECHOCARDIOGRAM REPORT   Patient Name:   [name] Date of Exam: 09/20/2019 Medical Rec #:  [id number]         Height:       67.0 in Accession #:    [id number]        Weight:       142.0 lb Date of Birth:  01-05-1956          BSA:          1.748 m Patient Age:    56 years          BP:           150/77 mmHg Patient Gender: F                 HR:           70 bpm. Exam Location:  [name] Procedure: 2D Echo Indications:    Stroke 434.91 / I163.9  History:        Patient has prior history of Echocardiogram examinations, most                 recent 08/18/2013. Stroke and COPD; Risk Factors:Former Smoker                 and Hypertension. Kidney donor.  Sonographer:    Leavy Cella RDCS (AE) Referring Phys: [phone] Royanne Foots [place]  1. Left ventricular ejection fraction, by estimation, is 60 to 65%. The left ventricle has normal function. The  left ventricle has no regional wall motion abnormalities. Left ventricular diastolic parameters are indeterminate.  2. Right ventricular systolic function is normal. The right ventricular size is normal.  3. The mitral valve is normal in structure. No evidence of mitral valve regurgitation. No evidence of mitral stenosis.  4. The aortic  valve is tricuspid. Aortic valve regurgitation is not visualized. No aortic stenosis is present.  5. The inferior vena cava is normal in size with greater than 50% respiratory variability, suggesting right atrial pressure of 3 mmHg. FINDINGS  Left Ventricle: Left ventricular ejection fraction, by estimation, is 60 to 65%. The left ventricle has normal function. The left ventricle has no regional wall motion abnormalities. The left ventricular internal cavity size was normal in size. There is  no left ventricular hypertrophy. Left ventricular diastolic parameters are indeterminate. Right Ventricle: The right ventricular size is normal. No increase in right ventricular wall thickness. Right ventricular systolic function is normal. Left Atrium: Left atrial size was normal in size. Right Atrium: Right atrial size was normal in size. Pericardium: There is no evidence of pericardial effusion. Mitral Valve: The mitral valve is normal in structure. No evidence of mitral valve regurgitation. No evidence of mitral valve stenosis. Tricuspid Valve: The tricuspid valve is normal in structure. Tricuspid valve regurgitation is mild . No evidence of tricuspid stenosis. Aortic Valve: The aortic valve is tricuspid. Aortic valve regurgitation is not visualized. No aortic stenosis is present. Aortic valve mean gradient measures 5.3 mmHg. Aortic valve peak gradient measures 9.0 mmHg. Aortic valve area, by VTI measures 1.42 cm. Pulmonic Valve: The pulmonic valve was not well visualized. Pulmonic valve regurgitation is not visualized. No evidence of pulmonic stenosis. Aorta: The aortic root is normal in  size and structure. Venous: The inferior vena cava is normal in size with greater than 50% respiratory variability, suggesting right atrial pressure of 3 mmHg. IAS/Shunts: No atrial level shunt detected by color flow Doppler.  LEFT VENTRICLE PLAX 2D LVIDd:         4.15 cm  Diastology LVIDs:         2.88 cm  LV e' medial:    6.20 cm/s LV PW:         1.18 cm  LV E/e' medial:  12.7 LV IVS:        1.05 cm  LV e' lateral:   6.53 cm/s LVOT diam:     1.80 cm  LV E/e' lateral: 12.1 LV SV:         40 LV SV Index:   23 LVOT Area:     2.54 cm  RIGHT VENTRICLE RV S prime:     12.00 cm/s TAPSE (M-mode): 1.9 cm LEFT ATRIUM             Index       RIGHT ATRIUM          Index LA diam:        2.50 cm 1.43 cm/m  RA Area:     8.35 cm LA Vol (A2C):   26.0 ml 14.87 ml/m RA Volume:   18.10 ml 10.35 ml/m LA Vol (A4C):   19.3 ml 11.04 ml/m LA Biplane Vol: 22.6 ml 12.93 ml/m  AORTIC VALVE AV Area (Vmax):    1.75 cm AV Area (Vmean):   1.42 cm AV Area (VTI):     1.42 cm AV Vmax:           150.29 cm/s AV Vmean:          111.201 cm/s AV VTI:            0.282 m AV Peak Grad:      9.0 mmHg AV Mean Grad:      5.3 mmHg LVOT Vmax:         103.26 cm/s LVOT Vmean:  62.142 cm/s LVOT VTI:          0.158 m LVOT/AV VTI ratio: 0.56  AORTA Ao Root diam: 3.00 cm MITRAL VALVE               TRICUSPID VALVE MV Area (PHT): 4.74 cm    TR Peak grad:   33.6 mmHg MV Decel Time: 160 msec    TR Vmax:        290.00 cm/s MV E velocity: 78.80 cm/s MV A velocity: 70.30 cm/s  SHUNTS MV E/A ratio:  1.12        Systemic VTI:  0.16 m                            Systemic Diam: 1.80 cm Carlyle Dolly MD Electronically signed by Carlyle Dolly MD Signature Date/Time: 09/20/2019/11:08:09 AM    Final    CT HEAD CODE STROKE WO CONTRAST  Result Date: 09/19/2019 CLINICAL DATA:  Code stroke.  Left hand numbness upon awakening. EXAM: CT HEAD WITHOUT CONTRAST TECHNIQUE: Contiguous axial images were obtained from the base of the skull through the vertex without  intravenous contrast. COMPARISON:  05/01/2003 FINDINGS: Brain: Cytotoxic edema appearance in the right occipital lobe affecting cortex. Infarct also seen at the lateral right thalamus. Patchy low-density in the cerebral white matter. No acute hemorrhage, hydrocephalus, or collection. Partially calcified right parafalcine nodule at the vertex measuring 1 cm, increased in size from 2005. Vascular: No hyperdense vessel. Skull: Negative Sinuses/Orbits: Negative Other: These results were called by telephone at the time of interpretation on 09/19/2019 at 9:25 am to provider [facility] ZAMMIT , who verbally acknowledged these results. IMPRESSION: 1. Acute or early subacute infarct in the right PCA distribution affecting occipital cortex and the lateral right thalamus. 2. Background chronic small vessel ischemia. 3. 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 Electronically Signed   By: Monte Fantasia M.D.   On: 09/19/2019 09:27   CT ANGIO HEAD CODE STROKE  Result Date: 09/19/2019 CLINICAL DATA:  Left hand numbness. EXAM: CT ANGIOGRAPHY HEAD AND NECK CT PERFUSION BRAIN TECHNIQUE: Multidetector CT imaging of the head and neck was performed using the standard protocol during bolus administration of intravenous contrast. Multiplanar CT image reconstructions and MIPs were obtained to evaluate the vascular anatomy. Carotid stenosis measurements (when applicable) are obtained utilizing NASCET criteria, using the distal internal carotid diameter as the denominator. Multiphase CT imaging of the brain was performed following IV bolus contrast injection. Subsequent parametric perfusion maps were calculated using RAPID software. CONTRAST:  1102mL OMNIPAQUE IOHEXOL 350 MG/ML SOLN COMPARISON:  None. FINDINGS: CTA NECK FINDINGS Aortic arch: Incomplete imaging of the aortic arch with exclusion of the brachiocephalic artery origin. Wide patency of the included portions of the  brachiocephalic and subclavian arteries. Right carotid system: Patent without evidence of stenosis, dissection, or significant atherosclerosis. Left carotid system: Patent without evidence of stenosis or dissection. Minimal calcified plaque at the carotid bifurcation. Vertebral arteries: Patent and codominant without evidence of stenosis or dissection. Skeleton: Mild cervical disc and facet degeneration. Other neck: 1 cm right thyroid nodule, considered clinically insignificant and for which no follow-up imaging is recommended. Prior right submandibular gland resection. Upper chest: Mild centrilobular emphysema and biapical pleuroparenchymal scarring. Review of the MIP images confirms the above findings CTA HEAD FINDINGS Anterior circulation: The internal carotid arteries are widely patent from skull base to carotid termini. There is a 2  mm aneurysm projecting laterally from the proximal left cavernous ICA. ACAs and MCAs are patent without evidence of a proximal branch occlusion or significant proximal stenosis. Posterior circulation: The intracranial vertebral arteries are widely patent to the basilar. Patent PICA and SCA origins are identified bilaterally. The basilar artery is widely patent. There is a moderate-sized right posterior communicating artery. The right PCA is occluded beginning at the proximal P2 segment. The left PCA is patent without evidence of a significant proximal stenosis. No aneurysm is identified. Venous sinuses: Patent. Anatomic variants: None. Review of the MIP images confirms the above findings CT Brain Perfusion Findings: CBF (<30%) Volume: 0 mL Perfusion (Tmax>6.0s) volume: 4 mL Mismatch Volume: 4 mL Infarction Location: The right PCA infarct visible on earlier noncontrast CT is not detected by automated CTP processing. The potential 4 mL penumbra is located in the mesial right temporal lobe. IMPRESSION: 1. Proximal right P2 occlusion. 2. CTP results as detailed above with at most a  small penumbra. 3. 2 mm left cavernous ICA aneurysm. 4. Widely patent carotid and vertebral arteries. 5.  Emphysema (ICD10-J43.9). These results were called by telephone at the time of interpretation on 09/19/2019 at 11:20 am to Dr. Roderic Palau, who verbally acknowledged these results. Electronically Signed   By: Logan Bores M.D.   On: 09/19/2019 11:52   CT ANGIO NECK CODE STROKE  Result Date: 09/19/2019 CLINICAL DATA:  Left hand numbness. EXAM: CT ANGIOGRAPHY HEAD AND NECK CT PERFUSION BRAIN TECHNIQUE: Multidetector CT imaging of the head and neck was performed using the standard protocol during bolus administration of intravenous contrast. Multiplanar CT image reconstructions and MIPs were obtained to evaluate the vascular anatomy. Carotid stenosis measurements (when applicable) are obtained utilizing NASCET criteria, using the distal internal carotid diameter as the denominator. Multiphase CT imaging of the brain was performed following IV bolus contrast injection. Subsequent parametric perfusion maps were calculated using RAPID software. CONTRAST:  128mL OMNIPAQUE IOHEXOL 350 MG/ML SOLN COMPARISON:  None. FINDINGS: CTA NECK FINDINGS Aortic arch: Incomplete imaging of the aortic arch with exclusion of the brachiocephalic artery origin. Wide patency of the included portions of the brachiocephalic and subclavian arteries. Right carotid system: Patent without evidence of stenosis, dissection, or significant atherosclerosis. Left carotid system: Patent without evidence of stenosis or dissection. Minimal calcified plaque at the carotid bifurcation. Vertebral arteries: Patent and codominant without evidence of stenosis or dissection. Skeleton: Mild cervical disc and facet degeneration. Other neck: 1 cm right thyroid nodule, considered clinically insignificant and for which no follow-up imaging is recommended. Prior right submandibular gland resection. Upper chest: Mild centrilobular emphysema and biapical  pleuroparenchymal scarring. Review of the MIP images confirms the above findings CTA HEAD FINDINGS Anterior circulation: The internal carotid arteries are widely patent from skull base to carotid termini. There is a 2 mm aneurysm projecting laterally from the proximal left cavernous ICA. ACAs and MCAs are patent without evidence of a proximal branch occlusion or significant proximal stenosis. Posterior circulation: The intracranial vertebral arteries are widely patent to the basilar. Patent PICA and SCA origins are identified bilaterally. The basilar artery is widely patent. There is a moderate-sized right posterior communicating artery. The right PCA is occluded beginning at the proximal P2 segment. The left PCA is patent without evidence of a significant proximal stenosis. No aneurysm is identified. Venous sinuses: Patent. Anatomic variants: None. Review of the MIP images confirms the above findings CT Brain Perfusion Findings: CBF (<30%) Volume: 0 mL Perfusion (Tmax>6.0s) volume: 4 mL Mismatch Volume: 4 mL  Infarction Location: The right PCA infarct visible on earlier noncontrast CT is not detected by automated CTP processing. The potential 4 mL penumbra is located in the mesial right temporal lobe. IMPRESSION: 1. Proximal right P2 occlusion. 2. CTP results as detailed above with at most a small penumbra. 3. 2 mm left cavernous ICA aneurysm. 4. Widely patent carotid and vertebral arteries. 5.  Emphysema (ICD10-J43.9). These results were called by telephone at the time of interpretation on 09/19/2019 at 11:20 am to Dr. Roderic Palau, who verbally acknowledged these results. Electronically Signed   By: Logan Bores M.D.   On: 09/19/2019 11:52        Scheduled Meds:   stroke: mapping our early stages of recovery book   Does not apply Once   aspirin EC  81 mg Oral Daily   azithromycin  500 mg Oral Daily   clopidogrel  75 mg Oral Q breakfast   enoxaparin (LOVENOX) injection  40 mg Subcutaneous Q24H    fluticasone  1 spray Each Nare Daily   loratadine  10 mg Oral Daily   pantoprazole  40 mg Oral Daily   simvastatin  40 mg Oral q1800    LOS: 1 day    Time spent: 30 minutes    Britani Beattie Darleen Crocker, DO [facility]  If 7PM-7AM, please contact night-coverage [email or web address] 09/20/2019, 3:39 PM

## 2019-09-20 NOTE — Evaluation (Signed)
Speech Language Pathology Evaluation Patient Details Name: [name] MRN: [id number] DOB: 01-29-1955 Today's Date: 09/20/2019 Time: 2094-7096 SLP Time Calculation (min) (ACUTE ONLY): 31 min  Problem List:  Patient Active Problem List   Diagnosis Date Noted  . CVA (cerebral vascular accident) ([place]) 09/19/2019  . Osteoarthritis of carpometacarpal [facility]) joint of right thumb 08/17/2018  . Constipation 07/08/2018  . Abdominal pain, left upper quadrant 07/08/2018  . Pain in right wrist 05/13/2018  . Chronic kidney disease 11/06/2015  . 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 Cough variant not due to asthma 05/16/2014  . Essential hypertension, benign 05/16/2014  . Rotator cuff impingement syndrome of right shoulder 04/25/2014  . 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  . Thyroiditis, subacute 12/22/2013  . Right thyroid nodule 12/22/2013  . Irritability 12/08/2013  . Arthritis 12/08/2013  . Joint pain 10/03/2013  . 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 History of nephrectomy 08/10/2013  . Paresthesia of both hands 08/09/2013  . C O P D 03/05/2007   Past Medical History:  Past Medical History:  Diagnosis Date  . Allergy   . Anxiety   . Arthritis   . Chronic kidney disease    kidney donor, has only one kidney  . Hypertension   . 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 Migraines   . Thyroid disease    Hx of    Past Surgical History:  Past Surgical History:  Procedure Laterality Date  . ABDOMINAL HYSTERECTOMY  1991   partial  . BLADDER SURGERY     bladder tac, then bladder tack reversal due to adhesions.  . DORSAL COMPARTMENT RELEASE Right 02/07/2013   Procedure: RIGHT WRIST DEQUERVAIN RELEASE;  Surgeon: Jolyn Nap, MD;  Location: [place];  Service: Orthopedics;  Laterality: Right;  . KIDNEY DONATION  2008   gave her lt kidney to daughter  . NECK MASS EXCISION     rt-mass  . NECK SURGERY  1990    rt ? parotidectomy  . SHOULDER ARTHROSCOPY WITH OPEN ROTATOR CUFF REPAIR AND DISTAL CLAVICLE ACROMINECTOMY Right 04/25/2014   Procedure: SHOULDER ARTHROSCOPY WITH MINI-OPEN ROTATOR CUFF REPAIR AND DISTAL CLAVICLE RESECTION;  Surgeon: Garald Balding, MD;  Location: [place];  Service: Orthopedics;  Laterality: Right;  . SHOULDER CLOSED REDUCTION Right 08/31/2014   Procedure: CLOSED MANIPULATION SHOULDER;  Surgeon: Garald Balding, MD;  Location: [place];  Service: Orthopedics;  Laterality: Right;  . SUBACROMIAL DECOMPRESSION Right 04/25/2014   Procedure: SUBACROMIAL DECOMPRESSION;  Surgeon: Garald Balding, MD;  Location: [place];  Service: Orthopedics;  Laterality: Right;  . THYROID SURGERY     bx   HPI:  [name] is a 64 y.o. female with medical history significant for CKD stage IIIa with prior left nephrectomy, hypertension, and GERD who was recently being treated for an ear infection as well as associated vertigo with antibiotics and meclizine over the last 3 days.  She states that she took some doxycycline for couple days, but this irritated her stomach so she stopped.  She has been experiencing some migraines as well as dizziness which have worsened today.  She apparently woke up earlier today with left hand numbness as well as left leg weakness and had 2 falls at home as a result. MRI reveals: "Fairly widely scattered acute infarcts in the Right PCA territory. Involvement in the medial right occipital lobe, occipital  Assessment / Plan / Recommendation Clinical Impression  Speech language evaluation was completed while Pt was sitting upright in the bed. Pt was evaluated by means of the [place] Examination, she achieved a score of 24/30 indicating mild cognitive impairment. Memory, executive functioning, word finding and thought organization seem to be intact, however, Pt presents with a Left neglect and/or visual  scanning/visual field deficit that largely impacts her written and reading ability. When asked to place the numbers around a clock face, she incorrectly placed multiple numbers outside the circle and all numbers were inappropriately spaced around the circle with no numbers placed in the upper left quadrant of the clock. Pt was unable to write her name in the upper L corner of the paper but wrote it perfectly on the Right side of the paper. When asked to read short sentences, she read phrases out of order and words were jumbled; this is seemingly related to her vision rather than her cognition. SLP educated the Pt of the above findings. Defer to OT for further diagnosis and treatment regarding visual impairment. There are no further ST needs noted at this time, ST to sign off.    SLP Assessment  SLP Recommendation/Assessment: Patient does not need any further Speech Lanaguage Pathology Services SLP Visit Diagnosis: Cognitive communication deficit (R41.841)    Follow Up Recommendations   NO ST f/u recommended at this time   Frequency and Duration  n/a        SLP Evaluation Cognition  Overall Cognitive Status: Within Functional Limits for tasks assessed Orientation Level: Oriented X4       Comprehension  Auditory Comprehension Overall Auditory Comprehension: Appears within functional limits for tasks assessed Visual Recognition/Discrimination Discrimination: Within Function Limits Reading Comprehension Reading Status: Impaired Word level: Within functional limits Sentence Level: Impaired Paragraph Level: Impaired Interfering Components: Visual scanning;Left neglect/inattention    Expression Expression Primary Mode of Expression: Verbal Verbal Expression Overall Verbal Expression: Appears within functional limits for tasks assessed Written Expression Dominant Hand: Right Written Expression: Within Functional Limits   Oral / Motor  Oral Motor/Sensory Function Overall Oral  Motor/Sensory Function: Within functional limits Motor Speech Overall Motor Speech: Appears within functional limits for tasks assessed     [place] H. Roddie Mc, CCC-SLP Speech Language Pathologist         Wende Bushy 09/20/2019, 2:34 PM

## 2019-09-20 NOTE — Plan of Care (Addendum)
°  Problem: Acute Rehab PT Goals(only PT should resolve) Goal: Pt Will Go Supine/Side To Sit Outcome: Progressing Flowsheets (Taken 09/20/2019 1314) Pt will go Supine/Side to Sit: with minimal assist Goal: Patient Will Transfer Sit To/From Stand Outcome: Progressing Flowsheets (Taken 09/20/2019 1314) Patient will transfer sit to/from stand: with min guard assist Goal: Pt Will Transfer Bed To Chair/Chair To Bed Outcome: Progressing Flowsheets (Taken 09/20/2019 1314) Pt will Transfer Bed to Chair/Chair to Bed: with min assist Goal: Pt Will Ambulate Outcome: Progressing Flowsheets (Taken 09/20/2019 1314) Pt will Ambulate:  with minimal assist  with rolling walker  50 feet   1:15 PM , 09/20/19 Karlyn Agee, SPT Physical Therapy with [facility] [phone] office   During this treatment session, the therapist was present, participating in and directing the treatment.  2:16 PM, 09/20/19 Lonell Grandchild, MPT Physical Therapist with [facility] [phone] office [phone] mobile phone

## 2019-09-20 NOTE — Progress Notes (Signed)
Alert and oriented x 4.  Weakness in left leg and vision affected on left side.  When reading stroke sentences did not read whole sentence but pieced together words from different sentences. Daughter states that the last few days she doesn't see things well that are on her left.

## 2019-09-20 NOTE — NC FL2 (Signed)
[place] LEVEL OF CARE SCREENING TOOL     IDENTIFICATION  Patient Name: [name] Birthdate: 1955/10/22 Sex: female Admission Date (Current Location): 09/19/2019  [facility] [place] and [place] Number:  [employer] and Address:  [facility] [address] [place]      Provider Number:    Attending Physician Name and Address:  Rodena Goldmann, DO  Relative Name and Phone Number:  Melina Copa [phone]    Current Level of Care: Hospital Recommended Level of Care: [place] Prior Approval Number:    Date Approved/Denied: 09/20/19 PASRR Number: [id number]  Discharge Plan: SNF    Current Diagnoses: Patient Active Problem List   Diagnosis Date Noted  . CVA (cerebral vascular accident) ([place]) 09/19/2019  . Osteoarthritis of carpometacarpal [facility]) joint of right thumb 08/17/2018  . Constipation 07/08/2018  . Abdominal pain, left upper quadrant 07/08/2018  . Pain in right wrist 05/13/2018  . Chronic kidney disease 11/06/2015  . 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 Cough variant not due to asthma 05/16/2014  . Essential hypertension, benign 05/16/2014  . Rotator cuff impingement syndrome of right shoulder 04/25/2014  . 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  . Thyroiditis, subacute 12/22/2013  . Right thyroid nodule 12/22/2013  . Irritability 12/08/2013  . Arthritis 12/08/2013  . Joint pain 10/03/2013  . 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 History of nephrectomy 08/10/2013  . Paresthesia of both hands 08/09/2013  . C O P D 03/05/2007    Orientation RESPIRATION BLADDER Height & Weight     Self, Time, Situation, Place  Normal Continent, External catheter Weight: 142 lb (64.4 kg) Height:  5\' 7"  (170.2 cm)  BEHAVIORAL SYMPTOMS/MOOD NEUROLOGICAL BOWEL NUTRITION STATUS      Continent Diet  AMBULATORY STATUS COMMUNICATION OF NEEDS Skin   Limited Assist Verbally  Normal                       Personal Care Assistance Level of Assistance  Bathing, Feeding, Dressing, Total care Bathing Assistance: Maximum assistance Feeding assistance: Limited assistance Dressing Assistance: Maximum assistance Total Care Assistance: Limited assistance   Functional Limitations Info  Sight, Hearing, Speech Sight Info: Impaired Hearing Info: Adequate Speech Info: Impaired    SPECIAL CARE FACTORS FREQUENCY  PT (By licensed PT), Speech therapy     PT Frequency: 5x       Speech Therapy Frequency: 5x      Contractures Contractures Info: Not present    Additional Factors Info  Code Status, Allergies Code Status Info: Full Allergies Info: Celebrex, Sulfonamide Derivatives, Ace Inhibitors, Dilaudid, Lactose Intolerance, Erythromycin, Lactose Intolerance, Sudafed, Losartan, Mucinex           Current Medications (09/20/2019):  This is the current hospital active medication list Current Facility-Administered Medications  Medication Dose Route Frequency Provider Last Rate Last Admin  .  stroke: mapping our early stages of recovery book   Does not apply Once Manuella Ghazi, Pratik D, DO      . acetaminophen (TYLENOL) tablet 650 mg  650 mg Oral Q4H PRN Manuella Ghazi, Pratik D, DO   650 mg at 09/20/19 1734   Or  . acetaminophen (TYLENOL) 160 MG/5ML solution 650 mg  650 mg Per Tube Q4H PRN Manuella Ghazi, Pratik D, DO       Or  . acetaminophen (TYLENOL) suppository 650 mg  650 mg Rectal Q4H PRN Manuella Ghazi, Pratik D, DO      .  aspirin EC tablet 81 mg  81 mg Oral Daily Manuella Ghazi, Pratik D, DO   81 mg at 09/20/19 1734  . azithromycin (ZITHROMAX) tablet 500 mg  500 mg Oral Daily Manuella Ghazi, Pratik D, DO   500 mg at 09/20/19 1133  . clopidogrel (PLAVIX) tablet 75 mg  75 mg Oral Q breakfast Phillips Odor, MD   75 mg at 09/20/19 1245  . enoxaparin (LOVENOX) injection 40 mg  40 mg Subcutaneous Q24H Shah, Pratik D, DO   40 mg at 09/20/19 1330  . fluticasone (FLONASE) 50 MCG/ACT nasal spray 1 spray  1 spray Each  Nare Daily Shah, Pratik D, DO   1 spray at 09/20/19 1134  . labetalol (NORMODYNE) injection 10 mg  10 mg Intravenous Q2H PRN Manuella Ghazi, Pratik D, DO      . loratadine (CLARITIN) tablet 10 mg  10 mg Oral Daily Manuella Ghazi, Pratik D, DO   10 mg at 09/20/19 1133  . meclizine (ANTIVERT) tablet 25 mg  25 mg Oral TID PRN Manuella Ghazi, Pratik D, DO      . pantoprazole (PROTONIX) EC tablet 40 mg  40 mg Oral Daily Manuella Ghazi, Pratik D, DO   40 mg at 09/20/19 1134  . simvastatin (ZOCOR) tablet 40 mg  40 mg Oral q1800 Manuella Ghazi, Pratik D, DO   40 mg at 09/20/19 1734  . SUMAtriptan (IMITREX) tablet 25 mg  25 mg Oral Q2H PRN Heath Lark D, DO         Discharge Medications: Please see discharge summary for a list of discharge medications.  Relevant Imaging Results:  Relevant Lab Results:   Additional Information Pt SSN: [id number]  Natasha Bence, LCSW

## 2019-09-20 NOTE — Consult Note (Signed)
Gilliam A. Merlene Laughter, MD     [email or web address]          [name] is an 64 y.o. female.   ASSESSMENT/PLAN: 1. 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 Risk factors age and hypertension. Dual antiplatelet agents are recommended. This is recommended for 3 months and the subsequently a single agent should suffice. A statin is also recommended. 2. Unruptured asymptomatic cerebral aneurysm. This be addressed later on as an outpatient. 3. Episodic migraines with aura. 4. Episodic dizziness that appears to be chronic  This is a 64 year old white female who presents with acute onset of severe vertiginous symptoms a few days ago. She apparently had does have a baseline history of episodic dizziness from time to time. She reports that the dizziness was quite severe few days ago that she went to an urgent care clinic in [place] are and was diagnosed with acute sinusitis. She was given antibiotics with this triggered 1 of her migraine headaches and she discontinued the medication. She reports that this morning she continued to have dizziness when she woke up she noted that she was weak on the left side and had difficulties walking. She apparently fell twice. She reports having numbness involving the left side especially the left upper extremity. She denies any new visual symptoms but does have visual symptoms with her migraines. She denies any dysarthria or dysphagia. She does not report clear weakness. The review of systems otherwise negative.     GENERAL: This is a pleasant female who appears somewhat anxious. She became tearful when relating her story.  HEENT: Neck is supple no trauma noted.  ABDOMEN: Soft  EXTREMITIES: No edema   BACK: Normal alignment.  SKIN: Normal by inspection.    MENTAL STATUS: Alert and oriented -including her age and the month.  Speech, language and cognition are generally intact. Judgment and insight normal.   CRANIAL NERVES: Pupils are equal, round and reactive to light and accommodation; extraocular movements are full, there is no significant nystagmus; upper and lower facial muscles are normal in strength and symmetric, there is no flattening of the nasolabial folds; tongue is midline; uvula is midline; shoulder elevation is normal. 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  MOTOR: Normal tone, bulk and strength; there is a mild drift involving the left leg and left upper extremity.  COORDINATION: Left finger to nose is normal, right finger to nose is normal, No rest tremor; no intention tremor; no postural tremor; no bradykinesia. Fine finger movements and hand dexterity are normal.  REFLEXES: Deep tendon reflexes are symmetrical and normal.   SENSATION: Normal to light touch and temperature. She is taking issues to double simultaneous stimulation in the left upper extremity but not the left leg.  NIH stroke scale 2, 1, 1, 1 total 5.    Blood pressure (!) 154/83, pulse 76, temperature 98.2 F (36.8 C), resp. rate 16, height 5\' 7"  (1.702 m), weight 64.4 kg, SpO2 98 %.  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   DORSAL COMPARTMENT RELEASE Right 02/07/2013   Procedure: RIGHT WRIST DEQUERVAIN RELEASE;  Surgeon: Jolyn Nap, MD;  Location: [facility];  Service: Orthopedics;  Laterality: Right;   KIDNEY DONATION  2008   gave her lt kidney to daughter   NECK MASS EXCISION     rt-mass   [place]   rt ? parotidectomy   SHOULDER ARTHROSCOPY WITH OPEN ROTATOR CUFF REPAIR AND DISTAL CLAVICLE ACROMINECTOMY Right 04/25/2014   Procedure: SHOULDER ARTHROSCOPY WITH MINI-OPEN ROTATOR CUFF REPAIR AND DISTAL CLAVICLE RESECTION;  Surgeon: Garald Balding, MD;  Location: [facility];  Service: Orthopedics;  Laterality: Right;   SHOULDER CLOSED REDUCTION Right 08/31/2014   Procedure: CLOSED MANIPULATION SHOULDER;  Surgeon: Garald Balding, MD;  Location: [facility];  Service: Orthopedics;  Laterality: Right;   SUBACROMIAL DECOMPRESSION Right 04/25/2014   Procedure: SUBACROMIAL DECOMPRESSION;  Surgeon: Garald Balding, MD;  Location: [facility];  Service: Orthopedics;  Laterality: Right;   THYROID SURGERY     bx    Family History  Problem Relation Age of Onset   Arthritis Mother        rheumatoid   Hypertension Mother    Heart disease Mother        Irregular heart beat    Arrhythmia Mother    Rheum arthritis Mother    Pulmonary fibrosis Mother    Cancer Father        Lung   COPD Father    Hypertension Sister    Aneurysm Brother 56       brain   Heart disease Maternal Aunt    Pulmonary fibrosis Maternal Aunt    Arthritis Maternal Uncle        rheumatoid   Pulmonary fibrosis Maternal Uncle    Heart disease Maternal Grandmother    Diabetes Maternal Uncle    Hypertension Sister    Colon cancer Neg Hx    Colon polyps Neg Hx     Social History:  reports that she quit smoking about 4 years ago. Her smoking use included cigarettes and e-cigarettes. She has a 13.50 pack-year smoking history. She has never used smokeless tobacco. She reports that she does not drink alcohol and does not use drugs.  Allergies:  Allergies  Allergen Reactions   Celebrex [Celecoxib] Anaphylaxis and Hives   Sulfonamide Derivatives Anaphylaxis, Swelling and Rash   Ace Inhibitors Cough   Dilaudid [Hydromorphone] Nausea And  Vomiting    Severe N/V   Erythromycin Nausea And Vomiting   Lactose Intolerance (Gi) Diarrhea    cramping   Sudafed [Pseudoephedrine Hcl]     Makes "high"   Losartan Palpitations   Mucinex [Guaifenesin Er] Palpitations    Medications: Prior to Admission medications   Medication Sig Start Date End Date Taking? Authorizing Provider  amLODipine (NORVASC) 5 MG tablet Take 5 mg by mouth daily.  04/15/17  Yes [provider]  fexofenadine (ALLEGRA) 180 MG tablet Take 180 mg by mouth daily as needed for allergies.    Yes [provider]  meclizine (ANTIVERT) 25 MG tablet Take 25 mg by mouth 3 (three) times daily as needed for dizziness. 09/16/19  Yes [provider]  omeprazole (PRILOSEC) 40 MG capsule Take 40 mg by mouth daily as needed (acid reflux).  06/08/18  Yes [provider]  traMADol (ULTRAM) 50 MG tablet Take 50 mg by mouth daily as needed for pain. 05/21/18  Yes [provider]    Scheduled Meds:   stroke: mapping our early stages of recovery book   Does not apply Once   azithromycin  500 mg Oral Daily   [START ON 09/21/2019] clopidogrel  75 mg Oral Q breakfast   enoxaparin (LOVENOX) injection  40 mg Subcutaneous Q24H   fluticasone  1 spray Each Nare Daily   loratadine  10 mg Oral Daily   pantoprazole  40 mg Oral Daily   Continuous Infusions: PRN Meds:.acetaminophen **OR** acetaminophen (TYLENOL) oral liquid 160 mg/5 mL **OR** acetaminophen, labetalol, meclizine, SUMAtriptan     Results for orders placed or performed during the hospital encounter of 09/19/19 (from the past 48 hour(s))  CBC with Differential/Platelet     Status: Abnormal   Collection Time: 09/19/19  8:56 AM  Result Value Ref Range   WBC 12.2 (H) 4.0 - 10.5 K/uL   RBC 5.05 3.87 - 5.11 MIL/uL   Hemoglobin 14.7 12.0 - 15.0 g/dL   HCT 45.3 36 - 46 %   MCV 89.7 80.0 - 100.0 fL   MCH 29.1 26.0 - 34.0 pg   MCHC 32.5 30.0 - 36.0 g/dL   RDW 11.8 11.5 - 15.5 %    Platelets 217 150 - 400 K/uL   nRBC 0.0 0.0 - 0.2 %   Neutrophils Relative % 85 %   Neutro Abs 10.4 (H) 1.7 - 7.7 K/uL   Lymphocytes Relative 8 %   Lymphs Abs 0.9 0.7 - 4.0 K/uL   Monocytes Relative 6 %   Monocytes Absolute 0.7 0 - 1 K/uL   Eosinophils Relative 0 %   Eosinophils Absolute 0.0 0 - 0 K/uL   Basophils Relative 0 %   Basophils Absolute 0.0 0 - 0 K/uL   Immature Granulocytes 1 %   Abs Immature Granulocytes 0.09 (H) 0.00 - 0.07 K/uL    Comment: Performed at [facility], [address] [place], [place] [address]  Comprehensive metabolic panel     Status: Abnormal   Collection Time: 09/19/19  8:56 AM  Result Value Ref Range   Sodium 139 135 - 145 mmol/L   Potassium 3.9 3.5 - 5.1 mmol/L   Chloride 101 98 - 111 mmol/L   CO2 27 22 - 32 mmol/L   Glucose, Bld 121 (H) 70 - 99 mg/dL    Comment: Glucose reference range applies only to samples taken after fasting for at least 8 hours.   BUN 21 8 - 23 mg/dL   Creatinine, Ser 1.00 0.44 - 1.00 mg/dL   Calcium 9.8 8.9 - 10.3 mg/dL   Total Protein 7.4 6.5 - 8.1 g/dL   Albumin 4.1 3.5 - 5.0 g/dL   AST 18 15 - 41 U/L   ALT 14 0 - 44 U/L   Alkaline Phosphatase 92 38 - 126 U/L   Total Bilirubin 0.6 0.3 - 1.2 mg/dL   GFR calc non Af Amer 59 (L) >60 mL/min   GFR calc Af Amer >60 >60 mL/min   Anion gap 11 5 - 15    Comment: Performed at [facility], [address] [place], [place] [address]  Hemoglobin A1c     Status: None   Collection Time: 09/19/19  8:56 AM  Result Value Ref Range   Hgb A1c MFr Bld 5.3 4.8 - 5.6 %    Comment: (NOTE) Pre diabetes:  5.7%-6.4%  Diabetes:              >6.4%  Glycemic control for   <7.0% adults with diabetes    Mean Plasma Glucose 105.41 mg/dL    Comment: Performed at [facility] [address] [place], [place] [address]  Ethanol     Status: None   Collection Time: 09/19/19  9:10 AM  Result Value Ref Range   Alcohol, Ethyl (B) <10 <10 mg/dL    Comment: (NOTE) Lowest  detectable limit for serum alcohol is 10 mg/dL.  For medical purposes only. Performed at [facility], [address], [place], [place] [address]   Protime-INR     Status: None   Collection Time: 09/19/19  9:10 AM  Result Value Ref Range   Prothrombin Time 12.0 11.4 - 15.2 seconds   INR 0.9 0.8 - 1.2    Comment: (NOTE) INR goal varies based on device and disease states. Performed at [facility], [address], [place], [place] [address]   APTT     Status: Abnormal   Collection Time: 09/19/19  9:10 AM  Result Value Ref Range   aPTT 23 (L) 24 - 36 seconds    Comment: Performed at [facility], [address], [place], [place] [address]  Urine rapid drug screen (hosp performed)     Status: None   Collection Time: 09/19/19  9:10 AM  Result Value Ref Range   Opiates NONE DETECTED NONE DETECTED   Cocaine NONE DETECTED NONE DETECTED   Benzodiazepines NONE DETECTED NONE DETECTED   Amphetamines NONE DETECTED NONE DETECTED   Tetrahydrocannabinol NONE DETECTED NONE DETECTED   Barbiturates NONE DETECTED NONE DETECTED    Comment: (NOTE) DRUG SCREEN FOR MEDICAL PURPOSES ONLY.  IF CONFIRMATION IS NEEDED FOR ANY PURPOSE, NOTIFY LAB WITHIN 5 DAYS.  LOWEST DETECTABLE LIMITS FOR URINE DRUG SCREEN Drug Class                     Cutoff (ng/mL) Amphetamine and metabolites    1000 Barbiturate and metabolites    200 Benzodiazepine                 856 Tricyclics and metabolites     300 Opiates and metabolites        300 Cocaine and metabolites        300 THC                            50 Performed at [facility], [address] [place], [place] [address]   Urinalysis, Routine w reflex microscopic     Status: Abnormal   Collection Time: 09/19/19  9:10 AM  Result Value Ref Range   Color, Urine YELLOW YELLOW   APPearance CLEAR CLEAR   Specific Gravity, Urine 1.019 1.005 - 1.030   pH 5.0 5.0 - 8.0   Glucose, UA NEGATIVE NEGATIVE mg/dL   Hgb urine dipstick SMALL (A) NEGATIVE   Bilirubin  Urine NEGATIVE NEGATIVE   Ketones, ur NEGATIVE NEGATIVE mg/dL   Protein, ur NEGATIVE NEGATIVE mg/dL   Nitrite NEGATIVE NEGATIVE   Leukocytes,Ua NEGATIVE NEGATIVE   RBC / HPF 0-5 0 - 5 RBC/hpf   WBC, UA 0-5 0 - 5 WBC/hpf   Bacteria, UA NONE SEEN NONE SEEN   Squamous Epithelial / LPF 0-5 0 - 5   Mucus PRESENT     Comment: Performed at [facility], [address]  [place], [place] [address]  CBG monitoring, ED     Status: Abnormal   Collection Time: 09/19/19  9:21 AM  Result Value Ref Range   Glucose-Capillary 127 (H) 70 - 99 mg/dL    Comment: Glucose reference range applies only to samples taken after fasting for at least 8 hours.  SARS Coronavirus 2 by RT PCR (hospital order, performed in [facility] hospital lab) Nasopharyngeal Nasopharyngeal Swab     Status: None   Collection Time: 09/19/19  9:22 AM   Specimen: Nasopharyngeal Swab  Result Value Ref Range   SARS Coronavirus 2 NEGATIVE NEGATIVE    Comment: (NOTE) SARS-CoV-2 target nucleic acids are NOT DETECTED.  The SARS-CoV-2 RNA is generally detectable in upper and lower respiratory specimens during the acute phase of infection. The lowest concentration of SARS-CoV-2 viral copies this assay can detect is 250 copies / mL. A negative result does not preclude SARS-CoV-2 infection and should not be used as the sole basis for treatment or other patient management decisions.  A negative result may occur with improper specimen collection / handling, submission of specimen other than nasopharyngeal swab, presence of viral mutation(s) within the areas targeted by this assay, and inadequate number of viral copies (<250 copies / mL). A negative result must be combined with clinical observations, patient history, and epidemiological information.  Fact Sheet for Patients:   StrictlyIdeas.no  Fact Sheet for Healthcare Providers: BankingDealers.co.za  This test is not yet approved or  cleared  by the [place] FDA and has been authorized for detection and/or diagnosis of SARS-CoV-2 by FDA under an Emergency Use Authorization (EUA).  This EUA will remain in effect (meaning this test can be used) for the duration of the COVID-19 declaration under Section 564(b)(1) of the Act, 21 U.S.C. section 360bbb-3(b)(1), unless the authorization is terminated or revoked sooner.  Performed at [facility], [address] [place], [place] [address]   Ginger Carne 8, ED     Status: Abnormal   Collection Time: 09/19/19  9:23 AM  Result Value Ref Range   Sodium 141 135 - 145 mmol/L   Potassium 3.9 3.5 - 5.1 mmol/L   Chloride 102 98 - 111 mmol/L   BUN 21 8 - 23 mg/dL   Creatinine, Ser 1.00 0.44 - 1.00 mg/dL   Glucose, Bld 120 (H) 70 - 99 mg/dL    Comment: Glucose reference range applies only to samples taken after fasting for at least 8 hours.   Calcium, Ion 1.29 1.15 - 1.40 mmol/L   TCO2 29 22 - 32 mmol/L   Hemoglobin 14.3 12.0 - 15.0 g/dL   HCT 42.0 36 - 46 %  HIV Antibody (routine testing w rflx)     Status: None   Collection Time: 09/19/19  2:29 PM  Result Value Ref Range   HIV Screen 4th Generation wRfx Non Reactive Non Reactive    Comment: Performed at [facility], [place] [address], [place], [place] [address]  Lipid panel     Status: Abnormal   Collection Time: 09/20/19  4:26 AM  Result Value Ref Range   Cholesterol 237 (H) 0 - 200 mg/dL   Triglycerides 82 <150 mg/dL   HDL 68 >40 mg/dL   Total CHOL/HDL Ratio 3.5 RATIO   VLDL 16 0 - 40 mg/dL   LDL Cholesterol 153 (H) 0 - 99 mg/dL    Comment:        Total Cholesterol/HDL:CHD Risk Coronary Heart Disease Risk Table  Men   Women  1/2 Average Risk   3.4   3.3  Average Risk       5.0   4.4  2 X Average Risk   9.6   7.1  3 X Average Risk  23.4   11.0        Use the calculated Patient Ratio above and the CHD Risk Table to determine the patient's CHD Risk.        ATP III CLASSIFICATION (LDL):  <100      mg/dL   Optimal  100-129  mg/dL   Near or Above                    Optimal  130-159  mg/dL   Borderline  160-189  mg/dL   High  >190     mg/dL   Very High Performed at [facility], [address], [place], [place] [address]   CBC     Status: None   Collection Time: 09/20/19  4:26 AM  Result Value Ref Range   WBC 9.7 4.0 - 10.5 K/uL   RBC 4.70 3.87 - 5.11 MIL/uL   Hemoglobin 13.9 12.0 - 15.0 g/dL   HCT 42.7 36 - 46 %   MCV 90.9 80.0 - 100.0 fL   MCH 29.6 26.0 - 34.0 pg   MCHC 32.6 30.0 - 36.0 g/dL   RDW 11.9 11.5 - 15.5 %   Platelets 200 150 - 400 K/uL   nRBC 0.0 0.0 - 0.2 %    Comment: Performed at [facility], [address], [place], [place] [address]  Basic metabolic panel     Status: Abnormal   Collection Time: 09/20/19  4:26 AM  Result Value Ref Range   Sodium 139 135 - 145 mmol/L   Potassium 3.8 3.5 - 5.1 mmol/L   Chloride 104 98 - 111 mmol/L   CO2 25 22 - 32 mmol/L   Glucose, Bld 97 70 - 99 mg/dL    Comment: Glucose reference range applies only to samples taken after fasting for at least 8 hours.   BUN 17 8 - 23 mg/dL   Creatinine, Ser 1.05 (H) 0.44 - 1.00 mg/dL   Calcium 9.3 8.9 - 10.3 mg/dL   GFR calc non Af Amer 56 (L) >60 mL/min   GFR calc Af Amer >60 >60 mL/min   Anion gap 10 5 - 15    Comment: Performed at [facility], [address], [place], [place] [address]  Magnesium     Status: None   Collection Time: 09/20/19  4:26 AM  Result Value Ref Range   Magnesium 2.1 1.7 - 2.4 mg/dL    Comment: Performed at [facility], [address] [place], [place] [address]    Studies/Results: FINDINGS: CTA NECK FINDINGS   Aortic arch: Incomplete imaging of the aortic arch with exclusion of the brachiocephalic artery origin. Wide patency of the included portions of the brachiocephalic and subclavian arteries.   Right carotid system: Patent without evidence of stenosis, dissection, or significant atherosclerosis.   Left carotid system: Patent without evidence of  stenosis or dissection. Minimal calcified plaque at the carotid bifurcation.   Vertebral arteries: Patent and codominant without evidence of stenosis or dissection.   Skeleton: Mild cervical disc and facet degeneration.   Other neck: 1 cm right thyroid nodule, considered clinically insignificant and for which no follow-up imaging is recommended. Prior right submandibular gland resection.   Upper chest: Mild centrilobular emphysema and biapical pleuroparenchymal  scarring.   Review of the MIP images confirms the above findings   CTA HEAD FINDINGS   Anterior circulation: The internal carotid arteries are widely patent from skull base to carotid termini. There is a 2 mm aneurysm projecting laterally from the proximal left cavernous ICA. ACAs and MCAs are patent without evidence of a proximal branch occlusion or significant proximal stenosis.   Posterior circulation: The intracranial vertebral arteries are widely patent to the basilar. Patent PICA and SCA origins are identified bilaterally. The basilar artery is widely patent. There is a moderate-sized right posterior communicating artery. The right PCA is occluded beginning at the proximal P2 segment. The left PCA is patent without evidence of a significant proximal stenosis. No aneurysm is identified.   Venous sinuses: Patent.   Anatomic variants: None.   Review of the MIP images confirms the above findings   CT Brain Perfusion Findings:   CBF (<30%) Volume: 0 mL   Perfusion (Tmax>6.0s) volume: 4 mL   Mismatch Volume: 4 mL   Infarction Location: The right PCA infarct visible on earlier noncontrast CT is not detected by automated CTP processing. The potential 4 mL penumbra is located in the mesial right temporal lobe.   IMPRESSION: 1. Proximal right P2 occlusion. 2. CTP results as detailed above with at most a small penumbra. 3. 2 mm left cavernous ICA aneurysm. 4. Widely patent carotid and vertebral arteries. 5.   Emphysema (ICD10-J43.9).    BRAIN MRI FINDINGS: Brain: Confluent restricted diffusion tracking from the right occipital pole through the medial right occipital lobe to the posterior mesial right temporal lobe, including involvement of the right hippocampal formation. Patchy, confluent involvement also of the right thalamus. And there are a few additional scattered smaller foci of restricted diffusion in the inferior right parietal lobe, and also at the splenium of the corpus callosum (series 5, image 21).   No posterior fossa or left hemisphere restricted diffusion.   T2 and FLAIR hyperintense cytotoxic edema at the areas of acute involvement. No evidence of acute hemorrhage. No intracranial mass effect.   No chronic cerebral blood products identified. Mild to moderate for age additional scattered nonspecific bilateral cerebral white matter T2 and FLAIR hyperintensity. No chronic cortical encephalomalacia identified. No midline shift, mass effect, evidence of mass lesion, ventriculomegaly, extra-axial collection or acute intracranial hemorrhage. Cervicomedullary junction and pituitary are within normal limits.   Vascular: Major intracranial vascular flow voids are preserved.   Skull and upper cervical spine: Negative visible cervical spine. Visualized bone marrow signal is within normal limits.   Sinuses/Orbits: Negative.   Other: Mastoids remain clear. Visible internal auditory structures appear normal. Scalp and face appear negative.   IMPRESSION: 1. Fairly widely scattered acute infarcts in the Right PCA territory. Involvement in the medial right occipital lobe, occipital pole, but also involving the right hippocampal formation, right thalamus, splenium.   2. No associated hemorrhage or mass effect.   3. No other acute intracranial abnormality. Mild to moderate for age underlying cerebral white matter signal changes.       The brain MRI scan is reviewed in  person. Increased signal is seen on DWI involving the occipital lobe, medial temporal lobe including the hippocampal formation and lateral aspect of the thalamus all on the left side. The spleen of the corpus callosum in the middle is also involved. No area of hemorrhage or encephalomalacia is noted.    Eyana Stolze A. Merlene Laughter, M.D.  Diplomate, Tax adviser of Psychiatry and Neurology ( Neurology). 09/20/2019, 11:29  AM

## 2019-09-20 NOTE — ED Notes (Addendum)
Per previous nurse and documentation, swallow screen completed and pt passed the swallow screen, see notes from 12:10

## 2019-09-20 NOTE — Progress Notes (Signed)
*  PRELIMINARY RESULTS* Echocardiogram 2D Echocardiogram has been performed.  [name] 09/20/2019, 10:59 AM

## 2019-09-20 NOTE — Evaluation (Addendum)
Physical Therapy Evaluation Patient Details Name: [name] MRN: [id number] DOB: [birth year] Today's Date: 09/20/2019   History of Present Illness  [name] is a 64 y.o. female with medical history significant for CKD stage IIIa with prior left nephrectomy, hypertension, and GERD who was recently being treated for an ear infection as well as associated vertigo with antibiotics and meclizine over the last 3 days.  She states that she took some doxycycline for couple days, but this irritated her stomach so she stopped.  She has been experiencing some migraines as well as dizziness which have worsened today.  She apparently woke up earlier today with left hand numbness as well as left leg weakness and had 2 falls at home as a result.  She denies any speech deficit or visual deficits.    Clinical Impression  The patient required min assist to perform supine to sit transfer and was given VC's in order to properly use LUE in order to power up body to sitting EOB. Once on the EOB, the patient required occasional TC's in order to maintain sitting balance. She was unable to locate the crackers she had in her left visual field during the session and when asked to don socks, she struggled to demonstrate adequate LUE coordination for the task. She had normal bilateral LE sensation, but displayed marked LLE strength deficits. She performed sit to stand without an AD, but was unsteady on her feet and had the addition of a RW. During 20 feet ambulation RW mod assist, the patient had irregular left foot placement with decreased SLS time on LLE. She required VC's in order to properly place LUE within RW. Patient tolerated sitting up in chair with nursing present at bedside after therapy. PLAN: The patient will continue to benefit from skilled physical therapy services in hospital at recommended venue below in order to improve balance, gait, and ADL's to promote independence in functional activities.       Follow Up Recommendations SNF;Supervision for mobility/OOB;Supervision - Intermittent    Equipment Recommendations   (rolling walker)    Recommendations for Other Services       Precautions / Restrictions Precautions Precautions: Fall Restrictions Weight Bearing Restrictions: No      Mobility  Bed Mobility Overal bed mobility: Needs Assistance Bed Mobility: Supine to Sit     Supine to sit: Min assist     General bed mobility comments: pt unaware of LUE deficits, VC's to push off LUE in order to power up body to sit EOB  Transfers Overall transfer level: Needs assistance Equipment used: Rolling walker (2 wheeled) Transfers: Sit to/from Stand Sit to Stand: Min assist;Mod assist         General transfer comment: mod assist without RW, min assist w RW, difficulty powering through LLE in order to stand up, decreased use of LLE for standing balance  Ambulation/Gait Ambulation/Gait assistance: Mod assist Gait Distance (Feet): 20 Feet Assistive device: Rolling walker (2 wheeled) Gait Pattern/deviations: Step-to pattern;Decreased stance time - left;Decreased step length - right;Decreased weight shift to left;Ataxic     General Gait Details: irregular placement of LLE during ambulation, required frequent mod assist correction to maintain standing balance during ambulation  Stairs            Wheelchair Mobility    Modified Rankin (Stroke Patients Only)       Balance Overall balance assessment: Needs assistance Sitting-balance support: Bilateral upper extremity supported Sitting balance-Leahy Scale: Fair Sitting balance - Comments: pt repeatedly would  fall toward L side, difficulty using LUE in order to maintain sitting balance Postural control: Left lateral lean Standing balance support: Bilateral upper extremity supported;During functional activity Standing balance-Leahy Scale: Poor Standing balance comment: frequent VC's to properly place LLE during gait;  VC's to place LUE properly within RW                             Pertinent Vitals/Pain Pain Assessment: No/denies pain    Home Living Family/patient expects to be discharged to:: Private residence Living Arrangements: Alone Available Help at Discharge: Family;Available 24 hours/day Type of Home: Mobile home Home Access: Stairs to enter Entrance Stairs-Rails: Left Entrance Stairs-Number of Steps: 3 Home Layout: One level Home Equipment: None Additional Comments: no use of AD prior to admission, independent with driving, shopping, and all ADL's    Prior Function Level of Independence: Independent               Hand Dominance   Dominant Hand: Right    Extremity/Trunk Assessment   Upper Extremity Assessment Upper Extremity Assessment: Defer to OT evaluation    Lower Extremity Assessment Lower Extremity Assessment: RLE deficits/detail;LLE deficits/detail RLE Deficits / Details: hip flex 5/5, knee ext 4+/5, ankle DF 5/5, knee flex 5/5 RLE Sensation: WNL RLE Coordination: WNL LLE Deficits / Details: hip flex 4/5, knee ext 4/5, ankle DF 4-/5, knee flex 4/5 LLE Sensation: WNL LLE Coordination: decreased gross motor    Cervical / Trunk Assessment Cervical / Trunk Assessment: Normal  Communication   Communication: No difficulties  Cognition Arousal/Alertness: Awake/alert Behavior During Therapy: WFL for tasks assessed/performed Overall Cognitive Status: Within Functional Limits for tasks assessed                                        General Comments      Exercises     Assessment/Plan    PT Assessment Patient needs continued PT services  PT Problem List Decreased strength;Decreased mobility;Decreased safety awareness;Decreased range of motion;Decreased coordination;Decreased activity tolerance;Decreased balance;Decreased knowledge of use of DME       PT Treatment Interventions DME instruction;Therapeutic exercise;Gait  training;Balance training;Functional mobility training;Neuromuscular re-education;Therapeutic activities;Patient/family education    PT Goals (Current goals can be found in the Care Plan section)  Acute Rehab PT Goals Patient Stated Goal: go home w assist PT Goal Formulation: With patient Time For Goal Achievement: 10/04/19 Potential to Achieve Goals: Good    Frequency Min 3X/week   Barriers to discharge        Co-evaluation               AM-PAC PT "6 Clicks" Mobility  Outcome Measure Help needed turning from your back to your side while in a flat bed without using bedrails?: None Help needed moving from lying on your back to sitting on the side of a flat bed without using bedrails?: A Little Help needed moving to and from a bed to a chair (including a wheelchair)?: A Little Help needed standing up from a chair using your arms (e.g., wheelchair or bedside chair)?: A Little Help needed to walk in hospital room?: A Little Help needed climbing 3-5 steps with a railing? : A Lot 6 Click Score: 18    End of Session Equipment Utilized During Treatment: Gait belt Activity Tolerance: Patient tolerated treatment well Patient left: in chair;with call bell/phone within  reach;with nursing/sitter in room   PT Visit Diagnosis: Unsteadiness on feet (R26.81);Hemiplegia and hemiparesis;Muscle weakness (generalized) (M62.81);History of falling (Z91.81);Other abnormalities of gait and mobility (R26.89);Ataxic gait (R26.0) Hemiplegia - Right/Left: Left Hemiplegia - dominant/non-dominant: Non-dominant Hemiplegia - caused by: Cerebral infarction    Time: 4461-9012 PT Time Calculation (min) (ACUTE ONLY): 32 min   Charges:     PT Treatments $Therapeutic Activity: 23-37 mins        2:15 PM , 09/20/19 Karlyn Agee, SPT Physical Therapy with [facility] [phone] office  During this treatment session, the therapist was present, participating in and directing  the treatment.  2:15 PM, 09/20/19 Lonell Grandchild, MPT Physical Therapist with [facility] [phone] office [phone] mobile phone

## 2019-09-20 NOTE — TOC Initial Note (Signed)
Transition of Care [facility]) - Initial/Assessment Note    Patient Details  Name: [name] MRN: [id number] Date of Birth: 1955-03-17  Transition of [facility]) CM/SW Contact:    Natasha Bence, LCSW Phone Number: 09/20/2019, 6:29 PM  Clinical Narrative:                 Patient is a 64 year old female admiteed for Cerebral Vascular Accident. CSW received SNF referral. CSW contacted patient to inquire about SNF preferences. Patient agreeable to SNF referrals in [place], [place], [place], and [place] due to proximity to family. CSW faxed SNF referrals to above locations, completed FL2 and PASRR. TOC to follow.  Expected Discharge Plan: Skilled Nursing Facility Barriers to Discharge: Continued Medical Work up   Patient Goals and CMS Choice        Expected Discharge Plan and Services Expected Discharge Plan: [place]                                              Prior Living Arrangements/Services                       Activities of Daily Living Home Assistive Devices/Equipment: None ADL Screening (condition at time of admission) Patient's cognitive ability adequate to safely complete daily activities?: Yes Is the patient deaf or have difficulty hearing?: No Does the patient have difficulty seeing, even when wearing glasses/contacts?: No Does the patient have difficulty concentrating, remembering, or making decisions?: No Patient able to express need for assistance with ADLs?: Yes Does the patient have difficulty dressing or bathing?: Yes Independently performs ADLs?: No Communication: Independent Dressing (OT): Needs assistance Is this a change from baseline?: Change from baseline, expected to last >3 days Grooming: Independent Feeding: Independent Bathing: Needs assistance Is this a change from baseline?: Change from baseline, expected to last >3 days Toileting: Needs assistance Is this a change from baseline?: Change from baseline,  expected to last >3days In/Out Bed: Independent Walks in Home: Independent with device (comment) Does the patient have difficulty walking or climbing stairs?: Yes Weakness of Legs: Both Weakness of Arms/Hands: Both  Permission Sought/Granted                  Emotional Assessment              Admission diagnosis:  CVA (cerebral vascular accident) ([place]) [I63.9] Cerebrovascular accident (CVA), unspecified mechanism ([place]) [I63.9] Patient Active Problem List   Diagnosis Date Noted  . CVA (cerebral vascular accident) ([place]) 09/19/2019  . Osteoarthritis of carpometacarpal [facility]) joint of right thumb 08/17/2018  . Constipation 07/08/2018  . Abdominal pain, left upper quadrant 07/08/2018  . Pain in right wrist 05/13/2018  . Chronic kidney disease 11/06/2015  . 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 Cough variant not due to asthma 05/16/2014  . Essential hypertension, benign 05/16/2014  . Rotator cuff impingement syndrome of right shoulder 04/25/2014  . 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  . Thyroiditis, subacute 12/22/2013  . Right thyroid nodule 12/22/2013  . Irritability 12/08/2013  . Arthritis 12/08/2013  . Joint pain 10/03/2013  . 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 History of nephrectomy 08/10/2013  . Paresthesia of both hands 08/09/2013  . C O P D 03/05/2007   PCP:  [name], MD Pharmacy:   [facility] -  [place], [address], [address] [place], [place] [address] Phone: [phone] Fax: [phone]  [id number] - [place], [place] - [address] [place] [address] Phone: [phone] Fax: [phone]     Social Determinants of Health (SDOH) Interventions    Readmission Risk Interventions Readmission Risk Prevention Plan 09/20/2019  Post Dischage Appt Complete  Medication Screening Complete   Transportation Screening Complete  Some recent data might be hidden

## 2019-09-21 ENCOUNTER — Encounter (HOSPITAL_COMMUNITY): Payer: Self-pay | Admitting: Internal Medicine

## 2019-09-21 DIAGNOSIS — K219 Gastro-esophageal reflux disease without esophagitis: Secondary | ICD-10-CM | POA: Diagnosis not present

## 2019-09-21 DIAGNOSIS — R262 Difficulty in walking, not elsewhere classified: Secondary | ICD-10-CM | POA: Diagnosis not present

## 2019-09-21 DIAGNOSIS — N1831 Chronic kidney disease, stage 3a: Secondary | ICD-10-CM | POA: Diagnosis not present

## 2019-09-21 DIAGNOSIS — R2681 Unsteadiness on feet: Secondary | ICD-10-CM | POA: Diagnosis not present

## 2019-09-21 DIAGNOSIS — I671 Cerebral aneurysm, nonruptured: Secondary | ICD-10-CM | POA: Diagnosis not present

## 2019-09-21 DIAGNOSIS — R2689 Other abnormalities of gait and mobility: Secondary | ICD-10-CM | POA: Diagnosis not present

## 2019-09-21 DIAGNOSIS — Z905 Acquired absence of kidney: Secondary | ICD-10-CM | POA: Diagnosis not present

## 2019-09-21 DIAGNOSIS — I129 Hypertensive chronic kidney disease with stage 1 through stage 4 chronic kidney disease, or unspecified chronic kidney disease: Secondary | ICD-10-CM | POA: Diagnosis not present

## 2019-09-21 DIAGNOSIS — I6602 Occlusion and stenosis of left middle cerebral artery: Secondary | ICD-10-CM | POA: Diagnosis not present

## 2019-09-21 DIAGNOSIS — M6281 Muscle weakness (generalized): Secondary | ICD-10-CM | POA: Diagnosis not present

## 2019-09-21 DIAGNOSIS — I639 Cerebral infarction, unspecified: Secondary | ICD-10-CM | POA: Diagnosis not present

## 2019-09-21 DIAGNOSIS — N183 Chronic kidney disease, stage 3 unspecified: Secondary | ICD-10-CM | POA: Diagnosis not present

## 2019-09-21 DIAGNOSIS — I10 Essential (primary) hypertension: Secondary | ICD-10-CM | POA: Diagnosis not present

## 2019-09-21 MED ORDER — SUMATRIPTAN SUCCINATE 25 MG PO TABS: 25.0000 mg | ORAL_TABLET | ORAL | 0 refills | Status: DC | PRN

## 2019-09-21 MED ORDER — CLOPIDOGREL BISULFATE 75 MG PO TABS: 75.0000 mg | ORAL_TABLET | Freq: Every day | ORAL | 0 refills | Status: DC

## 2019-09-21 MED ORDER — ACETAMINOPHEN 325 MG PO TABS: 650.0000 mg | ORAL_TABLET | ORAL | Status: DC | PRN

## 2019-09-21 MED ORDER — ASPIRIN 81 MG PO TBEC: 81.0000 mg | DELAYED_RELEASE_TABLET | Freq: Every day | ORAL | 11 refills | Status: AC

## 2019-09-21 MED ORDER — FLUTICASONE PROPIONATE 50 MCG/ACT NA SUSP: 2.0000 | Freq: Every day | NASAL | 2 refills | Status: DC

## 2019-09-21 MED ORDER — ATORVASTATIN CALCIUM 80 MG PO TABS: 80.0000 mg | ORAL_TABLET | Freq: Every evening | ORAL | 11 refills | Status: DC

## 2019-09-21 NOTE — Discharge Summary (Addendum)
Physician Discharge Summary  [name] DGL:[id number] DOB: Jul 04, 1955 DOA: 09/19/2019  PCP: [name], MD Neurologist: Dr. Merlene Laughter  Admit date: 09/19/2019 Discharge date: 09/21/2019  Admitted From:  Home  Disposition:   SNF   Recommendations for Outpatient Follow-up:  1. Follow up with PCP in 2 weeks 2. Follow up with neurologist in 1 month 3. Outpatient follow up and surveillance of cerebral aneurysm 4. Take aspirin and plavix for 30 days, then stop plavix and continue aspirin daily  Discharge Condition: STABLE    CODE STATUS: FULL  DIET:  Heart Healthy    Brief Hospitalization Summary: Please see all hospital notes, images, labs for full details of the hospitalization. ADMISSION HPI: [name] is a 64 y.o. female with medical history significant for CKD stage IIIa with prior left nephrectomy, hypertension, and GERD who was recently being treated for an ear infection as well as associated vertigo with antibiotics and meclizine over the last 3 days.  She states that she took some doxycycline for couple days, but this irritated her stomach so she stopped.  She has been experiencing some migraines as well as dizziness which have worsened today.  She apparently woke up earlier today with left hand numbness as well as left leg weakness and had 2 falls at home as a result.  She denies any speech deficit or visual deficits.   ED Course: Vital signs stable and patient afebrile.  Mild leukocytosis of 12,000 noted and creatinine is stable at 1.0.  Telemetry neurology consultation obtained with right proximal P2 occlusion noted and no LVO.  Recommendations noted for full dose aspirin in consultation with neurology.  Covid testing negative.  She is currently complaining of a migraine headache.  Acute CVA with proximal P2 occlusion -Brain MRI confirmed CVA -2D echocardiogram with LVEF 60-65% and indeterminate diastolic function -PT evaluation with recommendation for SNF.  Pt is  agreeable.  -Appreciate neurology evaluation with recommendation for dual antiplatelet therapy for 3 months and then single agent as well as statin -Continue on aspirin and Plavix for 30 days only, then stop plavix and continue aspirin daily -atorvastatin 80 mg daily  -Hemoglobin A1c 5.3% and LDL 153  Cerebral aneurysm - unruptured;  outpatient follow up with neurology recommended for ongoing surveillance    Recent ear/sinus infection - resolved now -With associated leukocytosis-resolved -Treated with azithromycin -Flonase daily  Migraine headache-improved -Imitrex as needed  History of hypertension -Hold home antihypertensives and allow permissive hypertension  History of GERD -PPI  History of CKD stage IIIa -With noted prior nephrectomywith donation to daughter -Appears at baseline  DVT prophylaxis:Lovenox Code Status: Full Family Communication: Discussed with son on phone 9/14, 9/15 Disposition Plan:   SNF  Status is: Inpatient  Discharge Diagnoses:  Active Problems:   CVA (cerebral vascular accident) [name])   Discharge Instructions:  Allergies as of 09/21/2019      Reactions   Celebrex [celecoxib] Anaphylaxis, Hives   Sulfonamide Derivatives Anaphylaxis, Swelling, Rash   Ace Inhibitors Cough   Dilaudid [hydromorphone] Nausea And Vomiting   Severe N/V   Erythromycin Nausea And Vomiting   Lactose Intolerance (gi) Diarrhea   cramping   Sudafed [pseudoephedrine Hcl]    Makes "high"   Losartan Palpitations   Mucinex [guaifenesin Er] Palpitations      Medication List    STOP taking these medications   traMADol 50 MG tablet Commonly known as: ULTRAM     TAKE these medications   acetaminophen 325 MG tablet Commonly known  as: TYLENOL Take 2 tablets (650 mg total) by mouth every 4 (four) hours as needed for mild pain (or temp > 37.5 C (99.5 F)).   amLODipine 5 MG tablet Commonly known as: NORVASC Take 5 mg by mouth daily.   aspirin 81 MG EC  tablet Take 1 tablet (81 mg total) by mouth daily. Swallow whole. Start taking on: September 22, 2019   atorvastatin 80 MG tablet Commonly known as: Lipitor Take 1 tablet (80 mg total) by mouth every evening.   clopidogrel 75 MG tablet Commonly known as: PLAVIX Take 1 tablet (75 mg total) by mouth daily with breakfast. Start taking on: September 22, 2019   fexofenadine 180 MG tablet Commonly known as: ALLEGRA Take 180 mg by mouth daily as needed for allergies.   fluticasone 50 MCG/ACT nasal spray Commonly known as: FLONASE Place 2 sprays into both nostrils daily. Start taking on: September 22, 2019   meclizine 25 MG tablet Commonly known as: ANTIVERT Take 25 mg by mouth 3 (three) times daily as needed for dizziness.   omeprazole 40 MG capsule Commonly known as: PRILOSEC Take 40 mg by mouth daily as needed (acid reflux).   SUMAtriptan 25 MG tablet Commonly known as: IMITREX Take 1 tablet (25 mg total) by mouth every 2 (two) hours as needed for up to 2 doses for migraine or headache. May repeat in 2 hours if headache persists or recurs.       Follow-up Information    [name], MD. Schedule an appointment as soon as possible for a visit in 2 week(s).   Specialty: Internal Medicine Contact information: [facility] [address] [phone]        Phillips Odor, MD. Schedule an appointment as soon as possible for a visit in 1 month(s).   Specialty: Neurology Why: Stroke hospital Follow Up  Contact information: [address] [place] [address] [phone]              Allergies  Allergen Reactions  . Celebrex [Celecoxib] Anaphylaxis and Hives  . Sulfonamide Derivatives Anaphylaxis, Swelling and Rash  . Ace Inhibitors Cough  . Dilaudid [Hydromorphone] Nausea And Vomiting    Severe N/V  . Erythromycin Nausea And Vomiting  . Lactose Intolerance (Gi) Diarrhea    cramping  . Sudafed [Pseudoephedrine Hcl]     Makes "high"  .  Losartan Palpitations  . Mucinex [Guaifenesin Er] Palpitations   Allergies as of 09/21/2019      Reactions   Celebrex [celecoxib] Anaphylaxis, Hives   Sulfonamide Derivatives Anaphylaxis, Swelling, Rash   Ace Inhibitors Cough   Dilaudid [hydromorphone] Nausea And Vomiting   Severe N/V   Erythromycin Nausea And Vomiting   Lactose Intolerance (gi) Diarrhea   cramping   Sudafed [pseudoephedrine Hcl]    Makes "high"   Losartan Palpitations   Mucinex [guaifenesin Er] Palpitations      Medication List    STOP taking these medications   traMADol 50 MG tablet Commonly known as: ULTRAM     TAKE these medications   acetaminophen 325 MG tablet Commonly known as: TYLENOL Take 2 tablets (650 mg total) by mouth every 4 (four) hours as needed for mild pain (or temp > 37.5 C (99.5 F)).   amLODipine 5 MG tablet Commonly known as: NORVASC Take 5 mg by mouth daily.   aspirin 81 MG EC tablet Take 1 tablet (81 mg total) by mouth daily. Swallow whole. Start taking on: September 22, 2019  atorvastatin 80 MG tablet Commonly known as: Lipitor Take 1 tablet (80 mg total) by mouth every evening.   clopidogrel 75 MG tablet Commonly known as: PLAVIX Take 1 tablet (75 mg total) by mouth daily with breakfast. Start taking on: September 22, 2019   fexofenadine 180 MG tablet Commonly known as: ALLEGRA Take 180 mg by mouth daily as needed for allergies.   fluticasone 50 MCG/ACT nasal spray Commonly known as: FLONASE Place 2 sprays into both nostrils daily. Start taking on: September 22, 2019   meclizine 25 MG tablet Commonly known as: ANTIVERT Take 25 mg by mouth 3 (three) times daily as needed for dizziness.   omeprazole 40 MG capsule Commonly known as: PRILOSEC Take 40 mg by mouth daily as needed (acid reflux).   SUMAtriptan 25 MG tablet Commonly known as: IMITREX Take 1 tablet (25 mg total) by mouth every 2 (two) hours as needed for up to 2 doses for migraine or headache. May  repeat in 2 hours if headache persists or recurs.       Procedures/Studies: MR BRAIN WO CONTRAST  Result Date: 09/19/2019 CLINICAL DATA:  64 year old female code stroke presentation, proximal right PCA P2 occlusion on CTA today. EXAM: MRI HEAD WITHOUT CONTRAST TECHNIQUE: Multiplanar, multiecho pulse sequences of the brain and surrounding structures were obtained without intravenous contrast. COMPARISON:  CTA and CTP, plain head CT earlier today. FINDINGS: Brain: Confluent restricted diffusion tracking from the right occipital pole through the medial right occipital lobe to the posterior mesial right temporal lobe, including involvement of the right hippocampal formation. Patchy, confluent involvement also of the right thalamus. And there are a few additional scattered smaller foci of restricted diffusion in the inferior right parietal lobe, and also at the splenium of the corpus callosum (series 5, image 21). No posterior fossa or left hemisphere restricted diffusion. T2 and FLAIR hyperintense cytotoxic edema at the areas of acute involvement. No evidence of acute hemorrhage. No intracranial mass effect. No chronic cerebral blood products identified. Mild to moderate for age additional scattered nonspecific bilateral cerebral white matter T2 and FLAIR hyperintensity. No chronic cortical encephalomalacia identified. No midline shift, mass effect, evidence of mass lesion, ventriculomegaly, extra-axial collection or acute intracranial hemorrhage. Cervicomedullary junction and pituitary are within normal limits. Vascular: Major intracranial vascular flow voids are preserved. Skull and upper cervical spine: Negative visible cervical spine. Visualized bone marrow signal is within normal limits. Sinuses/Orbits: Negative. Other: Mastoids remain clear. Visible internal auditory structures appear normal. Scalp and face appear negative. IMPRESSION: 1. Fairly widely scattered acute infarcts in the Right PCA territory.  Involvement in the medial right occipital lobe, occipital pole, but also involving the right hippocampal formation, right thalamus, splenium. 2. No associated hemorrhage or mass effect. 3. No other acute intracranial abnormality. Mild to moderate for age underlying cerebral white matter signal changes. Electronically Signed   By: Genevie Ann M.D.   On: 09/19/2019 15:29   CT CEREBRAL PERFUSION W CONTRAST  Result Date: 09/19/2019 CLINICAL DATA:  Left hand numbness. EXAM: CT ANGIOGRAPHY HEAD AND NECK CT PERFUSION BRAIN TECHNIQUE: Multidetector CT imaging of the head and neck was performed using the standard protocol during bolus administration of intravenous contrast. Multiplanar CT image reconstructions and MIPs were obtained to evaluate the vascular anatomy. Carotid stenosis measurements (when applicable) are obtained utilizing NASCET criteria, using the distal internal carotid diameter as the denominator. Multiphase CT imaging of the brain was performed following IV bolus contrast injection. Subsequent parametric perfusion maps were calculated using RAPID  software. CONTRAST:  171mL OMNIPAQUE IOHEXOL 350 MG/ML SOLN COMPARISON:  None. FINDINGS: CTA NECK FINDINGS Aortic arch: Incomplete imaging of the aortic arch with exclusion of the brachiocephalic artery origin. Wide patency of the included portions of the brachiocephalic and subclavian arteries. Right carotid system: Patent without evidence of stenosis, dissection, or significant atherosclerosis. Left carotid system: Patent without evidence of stenosis or dissection. Minimal calcified plaque at the carotid bifurcation. Vertebral arteries: Patent and codominant without evidence of stenosis or dissection. Skeleton: Mild cervical disc and facet degeneration. Other neck: 1 cm right thyroid nodule, considered clinically insignificant and for which no follow-up imaging is recommended. Prior right submandibular gland resection. Upper chest: Mild centrilobular emphysema  and biapical pleuroparenchymal scarring. Review of the MIP images confirms the above findings CTA HEAD FINDINGS Anterior circulation: The internal carotid arteries are widely patent from skull base to carotid termini. There is a 2 mm aneurysm projecting laterally from the proximal left cavernous ICA. ACAs and MCAs are patent without evidence of a proximal branch occlusion or significant proximal stenosis. Posterior circulation: The intracranial vertebral arteries are widely patent to the basilar. Patent PICA and SCA origins are identified bilaterally. The basilar artery is widely patent. There is a moderate-sized right posterior communicating artery. The right PCA is occluded beginning at the proximal P2 segment. The left PCA is patent without evidence of a significant proximal stenosis. No aneurysm is identified. Venous sinuses: Patent. Anatomic variants: None. Review of the MIP images confirms the above findings CT Brain Perfusion Findings: CBF (<30%) Volume: 0 mL Perfusion (Tmax>6.0s) volume: 4 mL Mismatch Volume: 4 mL Infarction Location: The right PCA infarct visible on earlier noncontrast CT is not detected by automated CTP processing. The potential 4 mL penumbra is located in the mesial right temporal lobe. IMPRESSION: 1. Proximal right P2 occlusion. 2. CTP results as detailed above with at most a small penumbra. 3. 2 mm left cavernous ICA aneurysm. 4. Widely patent carotid and vertebral arteries. 5.  Emphysema (ICD10-J43.9). These results were called by telephone at the time of interpretation on 09/19/2019 at 11:20 am to Dr. Roderic Palau, who verbally acknowledged these results. Electronically Signed   By: Logan Bores M.D.   On: 09/19/2019 11:52   ECHOCARDIOGRAM COMPLETE  Result Date: 09/20/2019    ECHOCARDIOGRAM REPORT   Patient Name:   [name] Date of Exam: 09/20/2019 Medical Rec #:  [id number]         Height:       67.0 in Accession #:    [id number]        Weight:       142.0 lb Date of Birth:   04-Jun-1955          BSA:          1.748 m Patient Age:    26 years          BP:           150/77 mmHg Patient Gender: F                 HR:           70 bpm. Exam Location:  [name] Procedure: 2D Echo Indications:    Stroke 434.91 / I163.9  History:        Patient has prior history of Echocardiogram examinations, most                 recent 08/18/2013. Stroke and COPD; Risk Factors:Former Smoker  and Hypertension. Kidney donor.  Sonographer:    Leavy Cella RDCS (AE) Referring Phys: [phone] Royanne Foots [place]  1. Left ventricular ejection fraction, by estimation, is 60 to 65%. The left ventricle has normal function. The left ventricle has no regional wall motion abnormalities. Left ventricular diastolic parameters are indeterminate.  2. Right ventricular systolic function is normal. The right ventricular size is normal.  3. The mitral valve is normal in structure. No evidence of mitral valve regurgitation. No evidence of mitral stenosis.  4. The aortic valve is tricuspid. Aortic valve regurgitation is not visualized. No aortic stenosis is present.  5. The inferior vena cava is normal in size with greater than 50% respiratory variability, suggesting right atrial pressure of 3 mmHg. FINDINGS  Left Ventricle: Left ventricular ejection fraction, by estimation, is 60 to 65%. The left ventricle has normal function. The left ventricle has no regional wall motion abnormalities. The left ventricular internal cavity size was normal in size. There is  no left ventricular hypertrophy. Left ventricular diastolic parameters are indeterminate. Right Ventricle: The right ventricular size is normal. No increase in right ventricular wall thickness. Right ventricular systolic function is normal. Left Atrium: Left atrial size was normal in size. Right Atrium: Right atrial size was normal in size. Pericardium: There is no evidence of pericardial effusion. Mitral Valve: The mitral valve is normal in structure.  No evidence of mitral valve regurgitation. No evidence of mitral valve stenosis. Tricuspid Valve: The tricuspid valve is normal in structure. Tricuspid valve regurgitation is mild . No evidence of tricuspid stenosis. Aortic Valve: The aortic valve is tricuspid. Aortic valve regurgitation is not visualized. No aortic stenosis is present. Aortic valve mean gradient measures 5.3 mmHg. Aortic valve peak gradient measures 9.0 mmHg. Aortic valve area, by VTI measures 1.42 cm. Pulmonic Valve: The pulmonic valve was not well visualized. Pulmonic valve regurgitation is not visualized. No evidence of pulmonic stenosis. Aorta: The aortic root is normal in size and structure. Venous: The inferior vena cava is normal in size with greater than 50% respiratory variability, suggesting right atrial pressure of 3 mmHg. IAS/Shunts: No atrial level shunt detected by color flow Doppler.  LEFT VENTRICLE PLAX 2D LVIDd:         4.15 cm  Diastology LVIDs:         2.88 cm  LV e' medial:    6.20 cm/s LV PW:         1.18 cm  LV E/e' medial:  12.7 LV IVS:        1.05 cm  LV e' lateral:   6.53 cm/s LVOT diam:     1.80 cm  LV E/e' lateral: 12.1 LV SV:         40 LV SV Index:   23 LVOT Area:     2.54 cm  RIGHT VENTRICLE RV S prime:     12.00 cm/s TAPSE (M-mode): 1.9 cm LEFT ATRIUM             Index       RIGHT ATRIUM          Index LA diam:        2.50 cm 1.43 cm/m  RA Area:     8.35 cm LA Vol (A2C):   26.0 ml 14.87 ml/m RA Volume:   18.10 ml 10.35 ml/m LA Vol (A4C):   19.3 ml 11.04 ml/m LA Biplane Vol: 22.6 ml 12.93 ml/m  AORTIC VALVE AV Area (Vmax):    1.75 cm AV  Area (Vmean):   1.42 cm AV Area (VTI):     1.42 cm AV Vmax:           150.29 cm/s AV Vmean:          111.201 cm/s AV VTI:            0.282 m AV Peak Grad:      9.0 mmHg AV Mean Grad:      5.3 mmHg LVOT Vmax:         103.26 cm/s LVOT Vmean:        62.142 cm/s LVOT VTI:          0.158 m LVOT/AV VTI ratio: 0.56  AORTA Ao Root diam: 3.00 cm MITRAL VALVE               TRICUSPID  VALVE MV Area (PHT): 4.74 cm    TR Peak grad:   33.6 mmHg MV Decel Time: 160 msec    TR Vmax:        290.00 cm/s MV E velocity: 78.80 cm/s MV A velocity: 70.30 cm/s  SHUNTS MV E/A ratio:  1.12        Systemic VTI:  0.16 m                            Systemic Diam: 1.80 cm Carlyle Dolly MD Electronically signed by Carlyle Dolly MD Signature Date/Time: 09/20/2019/11:08:09 AM    Final    CT HEAD CODE STROKE WO CONTRAST  Result Date: 09/19/2019 CLINICAL DATA:  Code stroke.  Left hand numbness upon awakening. EXAM: CT HEAD WITHOUT CONTRAST TECHNIQUE: Contiguous axial images were obtained from the base of the skull through the vertex without intravenous contrast. COMPARISON:  05/01/2003 FINDINGS: Brain: Cytotoxic edema appearance in the right occipital lobe affecting cortex. Infarct also seen at the lateral right thalamus. Patchy low-density in the cerebral white matter. No acute hemorrhage, hydrocephalus, or collection. Partially calcified right parafalcine nodule at the vertex measuring 1 cm, increased in size from 2005. Vascular: No hyperdense vessel. Skull: Negative Sinuses/Orbits: Negative Other: These results were called by telephone at the time of interpretation on 09/19/2019 at 9:25 am to provider [facility] ZAMMIT , who verbally acknowledged these results. IMPRESSION: 1. Acute or early subacute infarct in the right PCA distribution affecting occipital cortex and the lateral right thalamus. 2. Background chronic small vessel ischemia. 3. 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 Electronically Signed   By: Monte Fantasia M.D.   On: 09/19/2019 09:27   CT ANGIO HEAD CODE STROKE  Result Date: 09/19/2019 CLINICAL DATA:  Left hand numbness. EXAM: CT ANGIOGRAPHY HEAD AND NECK CT PERFUSION BRAIN TECHNIQUE: Multidetector CT imaging of the head and neck was performed using the standard protocol during bolus administration of intravenous contrast. Multiplanar CT  image reconstructions and MIPs were obtained to evaluate the vascular anatomy. Carotid stenosis measurements (when applicable) are obtained utilizing NASCET criteria, using the distal internal carotid diameter as the denominator. Multiphase CT imaging of the brain was performed following IV bolus contrast injection. Subsequent parametric perfusion maps were calculated using RAPID software. CONTRAST:  170mL OMNIPAQUE IOHEXOL 350 MG/ML SOLN COMPARISON:  None. FINDINGS: CTA NECK FINDINGS Aortic arch: Incomplete imaging of the aortic arch with exclusion of the brachiocephalic artery origin. Wide patency of the included portions of the brachiocephalic and subclavian arteries. Right carotid system: Patent without evidence of stenosis, dissection, or significant atherosclerosis.  Left carotid system: Patent without evidence of stenosis or dissection. Minimal calcified plaque at the carotid bifurcation. Vertebral arteries: Patent and codominant without evidence of stenosis or dissection. Skeleton: Mild cervical disc and facet degeneration. Other neck: 1 cm right thyroid nodule, considered clinically insignificant and for which no follow-up imaging is recommended. Prior right submandibular gland resection. Upper chest: Mild centrilobular emphysema and biapical pleuroparenchymal scarring. Review of the MIP images confirms the above findings CTA HEAD FINDINGS Anterior circulation: The internal carotid arteries are widely patent from skull base to carotid termini. There is a 2 mm aneurysm projecting laterally from the proximal left cavernous ICA. ACAs and MCAs are patent without evidence of a proximal branch occlusion or significant proximal stenosis. Posterior circulation: The intracranial vertebral arteries are widely patent to the basilar. Patent PICA and SCA origins are identified bilaterally. The basilar artery is widely patent. There is a moderate-sized right posterior communicating artery. The right PCA is occluded  beginning at the proximal P2 segment. The left PCA is patent without evidence of a significant proximal stenosis. No aneurysm is identified. Venous sinuses: Patent. Anatomic variants: None. Review of the MIP images confirms the above findings CT Brain Perfusion Findings: CBF (<30%) Volume: 0 mL Perfusion (Tmax>6.0s) volume: 4 mL Mismatch Volume: 4 mL Infarction Location: The right PCA infarct visible on earlier noncontrast CT is not detected by automated CTP processing. The potential 4 mL penumbra is located in the mesial right temporal lobe. IMPRESSION: 1. Proximal right P2 occlusion. 2. CTP results as detailed above with at most a small penumbra. 3. 2 mm left cavernous ICA aneurysm. 4. Widely patent carotid and vertebral arteries. 5.  Emphysema (ICD10-J43.9). These results were called by telephone at the time of interpretation on 09/19/2019 at 11:20 am to Dr. Roderic Palau, who verbally acknowledged these results. Electronically Signed   By: Logan Bores M.D.   On: 09/19/2019 11:52   CT ANGIO NECK CODE STROKE  Result Date: 09/19/2019 CLINICAL DATA:  Left hand numbness. EXAM: CT ANGIOGRAPHY HEAD AND NECK CT PERFUSION BRAIN TECHNIQUE: Multidetector CT imaging of the head and neck was performed using the standard protocol during bolus administration of intravenous contrast. Multiplanar CT image reconstructions and MIPs were obtained to evaluate the vascular anatomy. Carotid stenosis measurements (when applicable) are obtained utilizing NASCET criteria, using the distal internal carotid diameter as the denominator. Multiphase CT imaging of the brain was performed following IV bolus contrast injection. Subsequent parametric perfusion maps were calculated using RAPID software. CONTRAST:  116mL OMNIPAQUE IOHEXOL 350 MG/ML SOLN COMPARISON:  None. FINDINGS: CTA NECK FINDINGS Aortic arch: Incomplete imaging of the aortic arch with exclusion of the brachiocephalic artery origin. Wide patency of the included portions of the  brachiocephalic and subclavian arteries. Right carotid system: Patent without evidence of stenosis, dissection, or significant atherosclerosis. Left carotid system: Patent without evidence of stenosis or dissection. Minimal calcified plaque at the carotid bifurcation. Vertebral arteries: Patent and codominant without evidence of stenosis or dissection. Skeleton: Mild cervical disc and facet degeneration. Other neck: 1 cm right thyroid nodule, considered clinically insignificant and for which no follow-up imaging is recommended. Prior right submandibular gland resection. Upper chest: Mild centrilobular emphysema and biapical pleuroparenchymal scarring. Review of the MIP images confirms the above findings CTA HEAD FINDINGS Anterior circulation: The internal carotid arteries are widely patent from skull base to carotid termini. There is a 2 mm aneurysm projecting laterally from the proximal left cavernous ICA. ACAs and MCAs are patent without evidence of a proximal branch occlusion  or significant proximal stenosis. Posterior circulation: The intracranial vertebral arteries are widely patent to the basilar. Patent PICA and SCA origins are identified bilaterally. The basilar artery is widely patent. There is a moderate-sized right posterior communicating artery. The right PCA is occluded beginning at the proximal P2 segment. The left PCA is patent without evidence of a significant proximal stenosis. No aneurysm is identified. Venous sinuses: Patent. Anatomic variants: None. Review of the MIP images confirms the above findings CT Brain Perfusion Findings: CBF (<30%) Volume: 0 mL Perfusion (Tmax>6.0s) volume: 4 mL Mismatch Volume: 4 mL Infarction Location: The right PCA infarct visible on earlier noncontrast CT is not detected by automated CTP processing. The potential 4 mL penumbra is located in the mesial right temporal lobe. IMPRESSION: 1. Proximal right P2 occlusion. 2. CTP results as detailed above with at most a  small penumbra. 3. 2 mm left cavernous ICA aneurysm. 4. Widely patent carotid and vertebral arteries. 5.  Emphysema (ICD10-J43.9). These results were called by telephone at the time of interpretation on 09/19/2019 at 11:20 am to Dr. Roderic Palau, who verbally acknowledged these results. Electronically Signed   By: Logan Bores M.D.   On: 09/19/2019 11:52      Subjective: Pt agreeable to go to SNF today.  No other complaints.   Discharge Exam: Vitals:   09/21/19 0442 09/21/19 0830  BP: 95/76 129/78  Pulse: 65 65  Resp: 18 12  Temp: 98.2 F (36.8 C) 98.6 F (37 C)  SpO2: 97%    Vitals:   09/20/19 2316 09/21/19 0116 09/21/19 0442 09/21/19 0830  BP: (!) 150/85 (!) 157/79 95/76 129/78  Pulse: (!) 57 63 65 65  Resp: 16 16 18 12   Temp: 98.2 F (36.8 C) 98.2 F (36.8 C) 98.2 F (36.8 C) 98.6 F (37 C)  TempSrc:    Oral  SpO2: 92% 100% 97%   Weight:      Height:       General: Pt is alert, awake, not in acute distress Cardiovascular: normal S1/S2 +, no rubs, no gallops Respiratory: CTA bilaterally, no wheezing, no rhonchi Abdominal: Soft, NT, ND, bowel sounds + Extremities: no edema, no cyanosis Neurological: nonfocal exam.    The results of significant diagnostics from this hospitalization (including imaging, microbiology, ancillary and laboratory) are listed below for reference.     Microbiology: Recent Results (from the past 240 hour(s))  SARS Coronavirus 2 by RT PCR (hospital order, performed in [facility] hospital lab) Nasopharyngeal Nasopharyngeal Swab     Status: None   Collection Time: 09/19/19  9:22 AM   Specimen: Nasopharyngeal Swab  Result Value Ref Range Status   SARS Coronavirus 2 NEGATIVE NEGATIVE Final    Comment: (NOTE) SARS-CoV-2 target nucleic acids are NOT DETECTED.  The SARS-CoV-2 RNA is generally detectable in upper and lower respiratory specimens during the acute phase of infection. The lowest concentration of SARS-CoV-2 viral copies this assay can detect  is 250 copies / mL. A negative result does not preclude SARS-CoV-2 infection and should not be used as the sole basis for treatment or other patient management decisions.  A negative result may occur with improper specimen collection / handling, submission of specimen other than nasopharyngeal swab, presence of viral mutation(s) within the areas targeted by this assay, and inadequate number of viral copies (<250 copies / mL). A negative result must be combined with clinical observations, patient history, and epidemiological information.  Fact Sheet for Patients:   StrictlyIdeas.no  Fact Sheet for Healthcare Providers:  BankingDealers.co.za  This test is not yet approved or  cleared by the [place] and has been authorized for detection and/or diagnosis of SARS-CoV-2 by FDA under an Emergency Use Authorization (EUA).  This EUA will remain in effect (meaning this test can be used) for the duration of the COVID-19 declaration under Section 564(b)(1) of the Act, 21 U.S.C. section 360bbb-3(b)(1), unless the authorization is terminated or revoked sooner.  Performed at [facility], [address] [place], [place] [address]      Labs: BNP (last 3 results) No results for input(s): BNP in the last 8760 hours. Basic Metabolic Panel: Recent Labs  Lab 09/19/19 0856 09/19/19 0923 09/20/19 0426  NA 139 141 139  K 3.9 3.9 3.8  CL 101 102 104  CO2 27  --  25  GLUCOSE 121* 120* 97  BUN 21 21 17   CREATININE 1.00 1.00 1.05*  CALCIUM 9.8  --  9.3  MG  --   --  2.1   Liver Function Tests: Recent Labs  Lab 09/19/19 0856  AST 18  ALT 14  ALKPHOS 92  BILITOT 0.6  PROT 7.4  ALBUMIN 4.1   No results for input(s): LIPASE, AMYLASE in the last 168 hours. No results for input(s): AMMONIA in the last 168 hours. CBC: Recent Labs  Lab 09/19/19 0856 09/19/19 0923 09/20/19 0426  WBC 12.2*  --  9.7  NEUTROABS 10.4*  --   --   HGB  14.7 14.3 13.9  HCT 45.3 42.0 42.7  MCV 89.7  --  90.9  PLT 217  --  200   Cardiac Enzymes: No results for input(s): CKTOTAL, CKMB, CKMBINDEX, TROPONINI in the last 168 hours. BNP: Invalid input(s): POCBNP CBG: Recent Labs  Lab 09/19/19 0921 09/20/19 1643  GLUCAP 127* 91   D-Dimer No results for input(s): DDIMER in the last 72 hours. Hgb A1c Recent Labs    09/19/19 0856  HGBA1C 5.3   Lipid Profile Recent Labs    09/20/19 0426  CHOL 237*  HDL 68  LDLCALC 153*  TRIG 82  CHOLHDL 3.5   Thyroid function studies No results for input(s): TSH, T4TOTAL, T3FREE, THYROIDAB in the last 72 hours.  Invalid input(s): FREET3 Anemia work up No results for input(s): VITAMINB12, FOLATE, FERRITIN, TIBC, IRON, RETICCTPCT in the last 72 hours. Urinalysis    Component Value Date/Time   COLORURINE YELLOW 09/19/2019 0910   APPEARANCEUR CLEAR 09/19/2019 0910   LABSPEC 1.019 09/19/2019 0910   PHURINE 5.0 09/19/2019 0910   GLUCOSEU NEGATIVE 09/19/2019 0910   GLUCOSEU NEGATIVE 08/09/2013 0942   HGBUR SMALL (A) 09/19/2019 0910   BILIRUBINUR NEGATIVE 09/19/2019 0910   BILIRUBINUR small 08/07/2016 1406   KETONESUR NEGATIVE 09/19/2019 0910   PROTEINUR NEGATIVE 09/19/2019 0910   UROBILINOGEN 1.0 08/07/2016 1406   UROBILINOGEN 0.2 08/09/2013 0942   NITRITE NEGATIVE 09/19/2019 0910   LEUKOCYTESUR NEGATIVE 09/19/2019 0910   Sepsis Labs Invalid input(s): PROCALCITONIN,  WBC,  LACTICIDVEN Microbiology Recent Results (from the past 240 hour(s))  SARS Coronavirus 2 by RT PCR (hospital order, performed in [place] hospital lab) Nasopharyngeal Nasopharyngeal Swab     Status: None   Collection Time: 09/19/19  9:22 AM   Specimen: Nasopharyngeal Swab  Result Value Ref Range Status   SARS Coronavirus 2 NEGATIVE NEGATIVE Final    Comment: (NOTE) SARS-CoV-2 target nucleic acids are NOT DETECTED.  The SARS-CoV-2 RNA is generally detectable in upper and lower respiratory specimens during the  acute phase of infection. The  lowest concentration of SARS-CoV-2 viral copies this assay can detect is 250 copies / mL. A negative result does not preclude SARS-CoV-2 infection and should not be used as the sole basis for treatment or other patient management decisions.  A negative result may occur with improper specimen collection / handling, submission of specimen other than nasopharyngeal swab, presence of viral mutation(s) within the areas targeted by this assay, and inadequate number of viral copies (<250 copies / mL). A negative result must be combined with clinical observations, patient history, and epidemiological information.  Fact Sheet for Patients:   StrictlyIdeas.no  Fact Sheet for Healthcare Providers: BankingDealers.co.za  This test is not yet approved or  cleared by the [place] FDA and has been authorized for detection and/or diagnosis of SARS-CoV-2 by FDA under an Emergency Use Authorization (EUA).  This EUA will remain in effect (meaning this test can be used) for the duration of the COVID-19 declaration under Section 564(b)(1) of the Act, 21 U.S.C. section 360bbb-3(b)(1), unless the authorization is terminated or revoked sooner.  Performed at [facility], [address], [place], [place] [address]     Time coordinating discharge:  37 minutes   SIGNED:  Irwin Brakeman, MD  [facility] 09/21/2019, 11:24 AM How to contact the [facility] Attending or Consulting provider [place] or covering provider during after hours [place], for this patient?  1. Check the care team in [facility] and look for a) attending/consulting [facility] provider listed and b) the [facility] team listed 2. Log into [email or web address] and use [place]'s universal password to access. If you do not have the password, please contact the hospital operator. 3. Locate the [facility] provider you are looking for under [facility] and page to a number that you can be  directly reached. 4. If you still have difficulty reaching the provider, please page the [facility] (Director on Call) for the Hospitalists listed on amion for assistance.

## 2019-09-21 NOTE — TOC Transition Note (Signed)
Transition of Care [facility]) - CM/SW Discharge Note   Patient Details  Name: [name] MRN: [id number] Date of Birth: Jun 06, 1955  [facility] CM/SW Contact:  Shade Flood, LCSW Phone Number: 09/21/2019, 1:40 PM   Clinical Narrative:     Pt stable for dc per MD. Discussed SNF bed offers with pt and her son. They selected [facility]. Spoke with Kenney Houseman at [facility] and they can take pt today. DC clinical sent electronically. Pt will transfer by EMS. RN to call report.  There are no other TOC needs for dc.  Final next level of care: Skilled Nursing Facility Barriers to Discharge: Barriers Resolved   Patient Goals and CMS Choice     Choice offered to / list presented to : Patient  Discharge Placement PASRR number recieved: 09/20/19            Patient [facility] bed at: [facility] Patient to be transferred to facility by: EMS Name of family member notified: Shanon Brow (Son) Patient and family notified of of transfer: 09/21/19  Discharge Plan and Services                                     Social Determinants of Health ([place]) Interventions     Readmission Risk Interventions Readmission Risk Prevention Plan 09/20/2019  Post Dischage Appt Complete  Medication Screening Complete  Transportation Screening Complete  Some recent data might be hidden

## 2019-09-21 NOTE — Plan of Care (Signed)
  Problem: Intracerebral Hemorrhage Tissue Perfusion: Goal: Complications of Intracerebral Hemorrhage will be minimized Outcome: Adequate for Discharge   Problem: Self-Care: Goal: Ability to communicate needs accurately will improve Outcome: Adequate for Discharge   Problem: Self-Care: Goal: Verbalization of feelings and concerns over difficulty with self-care will improve Outcome: Adequate for Discharge   Problem: Spontaneous Subarachnoid Hemorrhage Tissue Perfusion: Goal: Complications of Spontaneous Subarachnoid Hemorrhage will be minimized Outcome: Adequate for Discharge   Problem: Intracerebral Hemorrhage Tissue Perfusion: Goal: Complications of Intracerebral Hemorrhage will be minimized Outcome: Adequate for Discharge   Problem: Self-Care: Goal: Verbalization of feelings and concerns over difficulty with self-care will improve Outcome: Adequate for Discharge Goal: Ability to communicate needs accurately will improve Outcome: Adequate for Discharge   Problem: Coping: Goal: Will identify appropriate support needs Outcome: Adequate for Discharge   Problem: Education: Goal: Knowledge of disease or condition will improve Outcome: Adequate for Discharge Goal: Knowledge of secondary prevention will improve Outcome: Adequate for Discharge Goal: Knowledge of patient specific risk factors addressed and post discharge goals established will improve Outcome: Adequate for Discharge Goal: Individualized Educational Video(s) Outcome: Completed/Met   Problem: Education: Goal: Knowledge of secondary prevention will improve Outcome: Adequate for Discharge

## 2019-09-21 NOTE — Progress Notes (Addendum)
Physical Therapy Treatment Patient Details Name: [name] MRN: [id number] DOB: 01/06/1956 Today's Date: 09/21/2019    History of Present Illness [name] is a 64 y.o. female with medical history significant for CKD stage IIIa with prior left nephrectomy, hypertension, and GERD who was recently being treated for an ear infection as well as associated vertigo with antibiotics and meclizine over the last 3 days.  She states that she took some doxycycline for couple days, but this irritated her stomach so she stopped.  She has been experiencing some migraines as well as dizziness which have worsened today.  She apparently woke up earlier today with left hand numbness as well as left leg weakness and had 2 falls at home as a result.  She denies any speech deficit or visual deficits.    PT Comments    The patient was sitting EOB upon entering the room. She tolerated upright sitting balance with demonstration of correct midline posture. She performed sit to stand min guard with RW and correctly used BUE's in order to assist without need for VC's. She ambulated 60 feet RW min assist, and required frequent VC's to prevent overstepping with LLE. The LLE frequently became trapped under walker limiting standing balance. Patient tolerated sitting up in chair with son present at bedside after therapy- RN notified. PLAN: The patient will continue to benefit from skilled physical therapy services in hospital at recommended venue below in order to improve balance, gait, and ADL's to promote independence in functional activities.     Follow Up Recommendations  SNF;Supervision for mobility/OOB;Supervision - Intermittent     Equipment Recommendations  Other (comment) (rolling walker)    Recommendations for Other Services       Precautions / Restrictions Precautions Precautions: Fall Restrictions Weight Bearing Restrictions: No    Mobility  Bed Mobility                   Transfers Overall transfer level: Needs assistance Equipment used: Rolling walker (2 wheeled) Transfers: Sit to/from Stand Sit to Stand: Min guard         General transfer comment: proper use of BUE's demonstrated in RW, slow labored movement to stand  Ambulation/Gait Ambulation/Gait assistance: Min guard Gait Distance (Feet): 60 Feet Assistive device: Rolling walker (2 wheeled) Gait Pattern/deviations: Step-to pattern;Decreased stance time - left;Decreased step length - right;Decreased weight shift to left;Ataxic     General Gait Details: overstepping w LLE, VC's to prevent LLE from overshooting and getting caught under RW; correct use BUE during gait   Stairs             Wheelchair Mobility    Modified Rankin (Stroke Patients Only)       Balance Overall balance assessment: Needs assistance Sitting-balance support: Feet supported;No upper extremity supported Sitting balance-Leahy Scale: Good Sitting balance - Comments: pt able to sit edge of chair without LOB and without TC's to maintain upright posture   Standing balance support: Bilateral upper extremity supported;During functional activity Standing balance-Leahy Scale: Fair Standing balance comment: frequent VC's to properly place LLE during gait; RLE strength adequate to prevent LOB with irregular LLE placement                            Cognition Arousal/Alertness: Awake/alert Behavior During Therapy: WFL for tasks assessed/performed Overall Cognitive Status: Within Functional Limits for tasks assessed  Exercises General Exercises - Lower Extremity Long Arc Quad: AROM;Strengthening;Seated;Both;10 reps Hip Flexion/Marching: AROM;Strengthening;Seated;Both;10 reps Toe Raises: AROM;Strengthening;Seated;Both;10 reps Heel Raises: AROM;Strengthening;Seated;Both;10 reps    General Comments        Pertinent Vitals/Pain Pain Assessment:  Faces Faces Pain Scale: Hurts a little bit Pain Location: left hip Pain Descriptors / Indicators: Aching;Sharp Pain Intervention(s): Limited activity within patient's tolerance;Monitored during session    Home Living                      Prior Function            PT Goals (current goals can now be found in the care plan section) Acute Rehab PT Goals Patient Stated Goal: go home PT Goal Formulation: With patient Time For Goal Achievement: 10/04/19 Potential to Achieve Goals: Good Progress towards PT goals: Progressing toward goals    Frequency    Min 3X/week      PT Plan Current plan remains appropriate    Co-evaluation              AM-PAC PT "6 Clicks" Mobility   Outcome Measure  Help needed turning from your back to your side while in a flat bed without using bedrails?: None Help needed moving from lying on your back to sitting on the side of a flat bed without using bedrails?: None Help needed moving to and from a bed to a chair (including a wheelchair)?: A Little Help needed standing up from a chair using your arms (e.g., wheelchair or bedside chair)?: None Help needed to walk in hospital room?: A Little Help needed climbing 3-5 steps with a railing? : A Lot 6 Click Score: 20    End of Session Equipment Utilized During Treatment: Gait belt Activity Tolerance: Patient tolerated treatment well;Patient limited by fatigue Patient left: in chair;with call bell/phone within reach;with family/visitor present Nurse Communication: Mobility status PT Visit Diagnosis: Unsteadiness on feet (R26.81);Hemiplegia and hemiparesis;Muscle weakness (generalized) (M62.81);History of falling (Z91.81);Other abnormalities of gait and mobility (R26.89);Ataxic gait (R26.0) Hemiplegia - Right/Left: Left Hemiplegia - dominant/non-dominant: Non-dominant Hemiplegia - caused by: Cerebral infarction     Time: 2094-7096 PT Time Calculation (min) (ACUTE ONLY): 19  min  Charges:  $Therapeutic Activity: 8-22 mins                     3:55 PM , 09/21/19 Karlyn Agee, SPT Physical Therapy with [facility] [phone] office   During this treatment session, the therapist was present, participating in and directing the treatment.  3:55 PM, 09/21/19 Lonell Grandchild, MPT Physical Therapist with [facility] 336 [phone] office [phone] mobile phone

## 2019-09-21 NOTE — Discharge Instructions (Signed)
Take plavix and aspirin together for 3 months only, then take aspirin daily after that but stop plavix.     Physical Therapy After a Stroke After a stroke, some people experience physical changes or problems. Physical therapy may be prescribed to help you recover and overcome problems such as:  Inability to move (paralysis) or weakness, typically affecting one side of the body.  Trouble with balance.  Pain, a pins and needles sensation, or numbness in certain parts of the body. You may also have difficulty feeling touch, pressure, or changes in temperature.  Involuntary muscle tightening (spasticity).  Stiffness in muscles and joints.  Altered coordination and reflexes. What causes physical disability after a stroke? A stroke can damage parts of your brain that control your body's normal functions, including your ability to move and to keep your balance. The types of physical problems you have will depend on how severe the stroke was and where it was located in the brain. Weakness or paralysis may affect just your fingers and hands, a whole leg or arm, or an entire side of your body. What is physical therapy? Physical therapy involves using exercises, stretches, and activities to help you regain movement and independence after your stroke. Physical therapy may focus on one or more of the following:  Range of motion. This can help with movement and reduce muscle stiffness.  Balance. This helps to lower your risk of falling.  Position changes or transfers, such as moving from sitting to standing or from a chair to a bed.  Coordination, such as getting an object from a shelf.  Muscle strength. Muscles may be strengthened with weights or by repeating certain motions.  Functional mobility. This may include stair training or learning how to use a wheelchair, walker, or cane.  Walking (gait training).  Activities of daily living, such as getting out of the car or buttoning a shirt. Why  is physical therapy important? It is important to do exercises and follow your rehabilitation plan as told by your physical therapist. Physical therapy can:  Help you regain independence.  Prevent injury from falls by building strength and balance.  Lower your risk of blood clots.  Lower your risk of skin sores (pressure injuries).  Increase physical activity and exercise. This may help lower your risk for another stroke.  Help reduce pain. When will therapy start and where will I have therapy? Your health care provider will decide when it is best for you to start therapy. In some cases, people start rehabilitation, including physical therapy, as soon as they are medically stable, which may be 24-48 hours after a stroke. Rehabilitation can take place in a few different places, based on your needs. It may take place in:  The hospital or an in-patient rehabilitation hospital.  An outpatient rehabilitation facility.  A long-term care facility.  A community rehabilitation clinic.  Your home. What are assistive devices? Assistive devices are tools to help you move, maintain balance, and manage daily tasks while recovering from a stroke. Your physical therapist may recommend and help you learn to use:  Equipment to help you move, such as wheelchairs, canes, or walkers.  Braces or splints to keep your arms, hands, legs, or feet in a comfortable and safe position.  Bathtub benches or grab bars to keep you safe in the bathroom.  Special utensils, bowls, and plates that allow you to eat with one hand. It is important to use these devices as told by your health care provider. Summary  After a stroke, some people may experience physical disabilities, such as weakness or paralysis, pain, or balance problems.  Physical therapy involves exercises, stretches, and activities that help to improve your ability to move and to handle daily tasks.  Physical therapy exercises focus on restoring  range of motion, balance, coordination, muscle strength, and the ability to move (mobility).  Physical therapy can help you regain independence, prevent falls, and allow you to live a more active lifestyle after a stroke. This information is not intended to replace advice given to you by your health care provider. Make sure you discuss any questions you have with your health care provider. Document Revised: 04/15/2018 Document Reviewed: 03/31/2016 Elsevier Patient Education  2020 [place] After a Stroke, Adult A stroke causes damage to the brain cells, which can affect your ability to walk, talk, or remember things. The impact of a stroke is different for everyone, and so is recovery. Some people have progress during the first few days after treatment. Others may take weeks or longer to make progress. Stroke rehabilitation includes a variety of treatments to help you recover and promote your independence after a stroke. You may not be able do everything that you did before the stroke, but you can learn ways to manage your lifestyle and be as independent as possible. Rehabilitation will start as soon as you are able to participate after your stroke, and it involves care from a team that may include:  Family and friends. Your loved ones know you best and can be very helpful in your recovery.  Physicians.  Nurses.  Physical and occupational therapists.  Speech-language therapists.  A nutritionist.  A psychologist.  A Education officer, museum. Keep open communication with all members of your care team. Share your medical records if needed, and take notes about each provider's recommendations. What is physical therapy? Physical therapists (PTs) help you to improve your coordination, balance, and muscle strength. Physical therapy may involve:  Range of motion exercises.  Help to move between lying, sitting, and standing positions.  Walking with a cane or walker, if  needed.  Help using stairs. What is occupational therapy? Occupational therapists (OTs) help you rebuild your ability to do everyday tasks, such as brushing your teeth, going to the bathroom, eating, and getting dressed. Occupational therapy may also help with:  Vision. Visual scanning is a technique that is used to prevent falls.  Memory and cognitive training. This therapy includes problem-solving techniques and relearning tasks like making a phone call.  Fine muscle movements such as buttoning a shirt or picking up small objects. What is speech therapy? Speech-language therapists help you communicate. After a stroke, you may have problems understanding what people are saying, or you may have trouble writing, speaking, or finding the right word for what you want to say. You may also need speech therapy if you have difficulty swallowing while eating and drinking. Examples of speech-language therapies include:  Techniques to strengthen muscles used in swallowing.  Naming objects or describing pictures. This helps retrain the brain to recognize and remember words.  Exercises to strengthen the muscles involved in talking, including your tongue and lips.  Exercises to retrain your brain in understanding what you read and hear. How often will I need therapy? Therapy will begin as soon as you are able to participate, which is often within the first few days after a stroke. Sessions will be frequent at first. For example, you may have therapy 2-3 hours a day  on most days of the week during the first few months. The intensity depends on the type and severity of your stroke. You may need therapy for several months. Therapy may take place in the hospital, at a rehabilitation center, or in your home. Are there any side effects of therapy? Therapy is safe and is usually well-tolerated. You may feel physically and mentally tired after therapy, especially during the first few weeks. Rest before therapy  sessions if you need to so you can get the most out of your rehabilitation. Follow these instructions at home:  Involve your family and friends in your recovery, if possible. Having another person to encourage you is beneficial.  Follow instructions from your speech-language therapist, nutritionist, or health care provider about what you can safely eat and drink. Eat healthy foods. If your ability to swallow was affected by the stroke, you may need to take steps to avoid choking, such as: ? Taking small bites when eating. ? Eating foods that are soft or pureed. ? Drinking liquids that have been thickened.  Maintain social connections and interactions with friends, family, and community groups. This is an important part of your recovery. Communication challenges and physical challenges may cause you to feel isolated after a stroke.  Consider joining a support group that allows you to talk about the impact of stroke on your life. A psychologist or counselor may be recommended. Your emotional recovery from stroke is just as important as your physical recovery.  Keep all follow-up visits as told by your health care providers. This is important. Summary  Stroke rehabilitation includes a variety of treatments to help you recover and promote your independence after a stroke.  Rehabilitation will start as soon as you are able to participate after your stroke, and it includes care from a team of experts.  The intensity of therapy depends on the type and severity of your stroke. You may need therapy for several months. This information is not intended to replace advice given to you by your health care provider. Make sure you discuss any questions you have with your health care provider. Document Revised: 04/14/2018 Document Reviewed: 12/25/2015 Elsevier Patient Education  [place].    IMPORTANT INFORMATION: PAY CLOSE ATTENTION   PHYSICIAN DISCHARGE INSTRUCTIONS  Follow with Primary  care provider  [name], MD  and other consultants as instructed by your Hospitalist Physician  [place] IF SYMPTOMS COME BACK, WORSEN OR NEW PROBLEM DEVELOPS   Please note: You were cared for by a hospitalist during your hospital stay. Every effort will be made to forward records to your primary care provider.  You can request that your primary care provider send for your hospital records if they have not received them.  Once you are discharged, your primary care physician will handle any further medical issues. Please note that NO REFILLS for any discharge medications will be authorized once you are discharged, as it is imperative that you return to your primary care physician (or establish a relationship with a primary care physician if you do not have one) for your post hospital discharge needs so that they can reassess your need for medications and monitor your lab values.  Please get a complete blood count and chemistry panel checked by your Primary MD at your next visit, and again as instructed by your Primary MD.  Get Medicines reviewed and adjusted: Please take all your medications with you for your next visit with  your Primary MD  Laboratory/radiological data: Please request your Primary MD to go over all hospital tests and procedure/radiological results at the follow up, please ask your primary care provider to get all Hospital records sent to his/her office.  In some cases, they will be blood work, cultures and biopsy results pending at the time of your discharge. Please request that your primary care provider follow up on these results.  If you are diabetic, please bring your blood sugar readings with you to your follow up appointment with primary care.    Please call and make your follow up appointments as soon as possible.    Also Note the following: If you experience worsening of your admission symptoms, develop shortness of breath, life  threatening emergency, suicidal or homicidal thoughts you must seek medical attention immediately by calling 911 or calling your MD immediately  if symptoms less severe.  You must read complete instructions/literature along with all the possible adverse reactions/side effects for all the Medicines you take and that have been prescribed to you. Take any new Medicines after you have completely understood and accpet all the possible adverse reactions/side effects.   Do not drive when taking Pain medications or sleeping medications (Benzodiazepines)  Do not take more than prescribed Pain, Sleep and Anxiety Medications. It is not advisable to combine anxiety,sleep and pain medications without talking with your primary care practitioner  Special Instructions: If you have smoked or chewed Tobacco  in the last 2 yrs please stop smoking, stop any regular Alcohol  and or any Recreational drug use.  Wear Seat belts while driving.  Do not drive if taking any narcotic, mind altering or controlled substances or recreational drugs or alcohol.

## 2019-09-21 NOTE — Plan of Care (Signed)
  Problem: Acute Rehab OT Goals (only OT should resolve) Goal: Pt. Will Perform Eating Flowsheets (Taken 09/21/2019 0853) Pt Will Perform Eating:  with modified independence  sitting Goal: Pt. Will Perform Grooming Flowsheets (Taken 09/21/2019 0853) Pt Will Perform Grooming:  with modified independence  standing Goal: Pt. Will Perform Upper Body Dressing Flowsheets (Taken 09/21/2019 0853) Pt Will Perform Upper Body Dressing:  with modified independence  sitting Goal: Pt. Will Perform Lower Body Dressing Flowsheets (Taken 09/21/2019 0853) Pt Will Perform Lower Body Dressing:  with modified independence  sitting/lateral leans  sit to/from stand Goal: Pt. Will Transfer To Toilet Flowsheets (Taken 09/21/2019 0853) Pt Will Transfer to Toilet:  with supervision  ambulating  regular height toilet Goal: Pt. Will Perform Toileting-Clothing Manipulation Flowsheets (Taken 09/21/2019 0853) Pt Will Perform Toileting - Clothing Manipulation and hygiene:  with supervision  sitting/lateral leans  sit to/from stand Goal: Pt/Caregiver Will Perform Home Exercise Program Flowsheets (Taken 09/21/2019 [phone]) Pt/caregiver will Perform Home Exercise Program:  Increased strength  Left upper extremity  Independently  With written HEP provided

## 2019-09-21 NOTE — Evaluation (Signed)
Occupational Therapy Evaluation Patient Details Name: [name] MRN: [id number] DOB: 07-Feb-1955 Today's Date: 09/21/2019    History of Present Illness [name] is a 64 y.o. female with medical history significant for CKD stage IIIa with prior left nephrectomy, hypertension, and GERD who was recently being treated for an ear infection as well as associated vertigo with antibiotics and meclizine over the last 3 days.  She states that she took some doxycycline for couple days, but this irritated her stomach so she stopped.  She has been experiencing some migraines as well as dizziness which have worsened today.  She apparently woke up earlier today with left hand numbness as well as left leg weakness and had 2 falls at home as a result.  She denies any speech deficit or visual deficits.   Clinical Impression   Pt agreeable to OT evaluation this am. Pt completing ADLs including functional mobility with supervision/min guard assist today. Pt aware of LUE and incorporating into tasks, requires occasional cuing for holding RW prior to standing. Vision tested 3x formally and during ADLs, pt demonstrates visual fields WFL, no visual field cuts or hemianopsia observed. Pt reports daughter is able to stay with her at home, recommend [facility] services with 24/7 supervision on discharge. Will continue to follow while in acute care.     Follow Up Recommendations  Home health OT;Supervision/Assistance - 24 hour    Equipment Recommendations  Tub/shower seat       Precautions / Restrictions Precautions Precautions: Fall Restrictions Weight Bearing Restrictions: No      Mobility Bed Mobility Overal bed mobility: Needs Assistance Bed Mobility: Supine to Sit     Supine to sit: Supervision     General bed mobility comments: pt using BUE for pushing up and sliding to EOB  Transfers Overall transfer level: Needs assistance Equipment used: Rolling walker (2 wheeled) Transfers: Sit to/from  [facility] Sit to Stand: Min guard Stand pivot transfers: Min guard       General transfer comment: cuing for directions when turning        ADL either performed or assessed with clinical judgement   ADL Overall ADL's : Needs assistance/impaired     Grooming: Wash/dry hands;Supervision/safety;Standing Grooming Details (indicate cue type and reason): pt standing at sink for hand washing, incorporating BUE into task                 Toilet Transfer: Min guard;Ambulation;RW;Grab bars Toilet Transfer Details (indicate cue type and reason): Pt requiring cuing to use grab bar Toileting- Clothing Manipulation and Hygiene: Supervision/safety;Sitting/lateral lean       Functional mobility during ADLs: Supervision/safety;Min guard;Rolling walker       Vision Baseline Vision/History: Wears glasses Wears Glasses: At all times Patient Visual Report: No change from baseline Vision Assessment?: Yes Eye Alignment: Within Functional Limits Ocular Range of Motion: Within Functional Limits Alignment/Gaze Preference: Within Defined Limits Tracking/Visual Pursuits: Able to track stimulus in all quads without difficulty Saccades: Within functional limits Convergence: Within functional limits Visual Fields: No apparent deficits            Pertinent Vitals/Pain Pain Assessment: Faces Faces Pain Scale: Hurts a little bit Pain Location: left hip Pain Descriptors / Indicators: Aching;Sharp Pain Intervention(s): Limited activity within patient's tolerance;Monitored during session;Repositioned     Hand Dominance Right   Extremity/Trunk Assessment Upper Extremity Assessment Upper Extremity Assessment: LUE deficits/detail LUE Deficits / Details: LUE strength 4/5 shoulder, 5/5 elbow and wrist, mild decrease in grip strength  LUE Sensation: WNL LUE Coordination: decreased fine motor;decreased gross motor (mild coordination deficits)   Lower Extremity  Assessment Lower Extremity Assessment: Defer to PT evaluation   Cervical / Trunk Assessment Cervical / Trunk Assessment: Normal   Communication Communication Communication: No difficulties   Cognition Arousal/Alertness: Awake/alert Behavior During Therapy: WFL for tasks assessed/performed Overall Cognitive Status: Within Functional Limits for tasks assessed                                                Home Living Family/patient expects to be discharged to:: Private residence Living Arrangements: Alone Available Help at Discharge: Family;Available 24 hours/day (daughter) Type of Home: Mobile home Home Access: Stairs to enter Entrance Stairs-Number of Steps: 3 Entrance Stairs-Rails: Left Home Layout: One level     Bathroom Shower/Tub: Occupational psychologist: Standard     Home Equipment: None   Additional Comments: no use of AD prior to admission, independent with driving, shopping, and all ADL's      Prior Functioning/Environment Level of Independence: Independent                 OT Problem List: Decreased strength;Decreased activity tolerance;Impaired balance (sitting and/or standing);Decreased coordination;Decreased safety awareness;Decreased knowledge of use of DME or AE;Impaired UE functional use      OT Treatment/Interventions: Self-care/ADL training;Therapeutic exercise;Neuromuscular education;Therapeutic activities;DME and/or AE instruction;Patient/family education;Visual/perceptual remediation/compensation    OT Goals(Current goals can be found in the care plan section) Acute Rehab OT Goals Patient Stated Goal: go home with daughter to assist OT Goal Formulation: With patient Time For Goal Achievement: 10/05/19 Potential to Achieve Goals: Good  OT Frequency: Min 2X/week                 End of Session Equipment Utilized During Treatment: Gait belt;Rolling walker Nurse Communication: Mobility status  Activity  Tolerance: Patient tolerated treatment well Patient left: in chair;with call bell/phone within reach;with chair alarm set;with nursing/sitter in room  OT Visit Diagnosis: Repeated falls (R29.6);Muscle weakness (generalized) (M62.81);Hemiplegia and hemiparesis Hemiplegia - Right/Left: Left Hemiplegia - dominant/non-dominant: Non-Dominant Hemiplegia - caused by: Cerebral infarction                Time: 8315-1761 OT Time Calculation (min): 21 min Charges:  OT General Charges $OT Visit: 1 Visit OT Evaluation $OT Eval Low Complexity: [place], OTR/L  [phone] 09/21/2019, 8:50 AM

## 2019-09-22 DIAGNOSIS — K219 Gastro-esophageal reflux disease without esophagitis: Secondary | ICD-10-CM | POA: Diagnosis not present

## 2019-09-22 DIAGNOSIS — I129 Hypertensive chronic kidney disease with stage 1 through stage 4 chronic kidney disease, or unspecified chronic kidney disease: Secondary | ICD-10-CM | POA: Diagnosis not present

## 2019-09-22 DIAGNOSIS — N1831 Chronic kidney disease, stage 3a: Secondary | ICD-10-CM | POA: Diagnosis not present

## 2019-09-22 DIAGNOSIS — N183 Chronic kidney disease, stage 3 unspecified: Secondary | ICD-10-CM | POA: Diagnosis not present

## 2019-09-23 ENCOUNTER — Other Ambulatory Visit: Payer: Self-pay | Admitting: Family Medicine

## 2019-09-23 DIAGNOSIS — I639 Cerebral infarction, unspecified: Secondary | ICD-10-CM

## 2019-09-23 DIAGNOSIS — G43709 Chronic migraine without aura, not intractable, without status migrainosus: Secondary | ICD-10-CM

## 2019-09-23 MED ORDER — CLOPIDOGREL BISULFATE 75 MG PO TABS: 75.0000 mg | ORAL_TABLET | Freq: Every day | ORAL | 0 refills | Status: AC

## 2019-09-23 MED ORDER — SUMATRIPTAN SUCCINATE 25 MG PO TABS: 25.0000 mg | ORAL_TABLET | ORAL | 1 refills | Status: DC | PRN

## 2019-09-23 NOTE — Progress Notes (Signed)
09/23/2019 6:06 PM  Call to son, patient had to be removed from [facility] care due to safety concerns, requested Rx for plavix and imitrex to go to [place]..I sent the Rx to the pharmacy electronically.   Irwin Brakeman MD

## 2019-09-27 DIAGNOSIS — I671 Cerebral aneurysm, nonruptured: Secondary | ICD-10-CM | POA: Diagnosis not present

## 2019-09-27 DIAGNOSIS — I6349 Cerebral infarction due to embolism of other cerebral artery: Secondary | ICD-10-CM | POA: Diagnosis not present

## 2019-09-27 DIAGNOSIS — J019 Acute sinusitis, unspecified: Secondary | ICD-10-CM | POA: Diagnosis not present

## 2019-09-28 DIAGNOSIS — I639 Cerebral infarction, unspecified: Secondary | ICD-10-CM | POA: Diagnosis not present

## 2019-09-29 DIAGNOSIS — I69398 Other sequelae of cerebral infarction: Secondary | ICD-10-CM | POA: Diagnosis not present

## 2019-09-29 DIAGNOSIS — Z7901 Long term (current) use of anticoagulants: Secondary | ICD-10-CM | POA: Diagnosis not present

## 2019-09-29 DIAGNOSIS — Z7982 Long term (current) use of aspirin: Secondary | ICD-10-CM | POA: Diagnosis not present

## 2019-09-29 DIAGNOSIS — G43909 Migraine, unspecified, not intractable, without status migrainosus: Secondary | ICD-10-CM | POA: Diagnosis not present

## 2019-09-29 DIAGNOSIS — Z9181 History of falling: Secondary | ICD-10-CM | POA: Diagnosis not present

## 2019-09-29 DIAGNOSIS — I69354 Hemiplegia and hemiparesis following cerebral infarction affecting left non-dominant side: Secondary | ICD-10-CM | POA: Diagnosis not present

## 2019-09-29 DIAGNOSIS — H547 Unspecified visual loss: Secondary | ICD-10-CM | POA: Diagnosis not present

## 2019-09-30 DIAGNOSIS — Z7901 Long term (current) use of anticoagulants: Secondary | ICD-10-CM | POA: Diagnosis not present

## 2019-09-30 DIAGNOSIS — H547 Unspecified visual loss: Secondary | ICD-10-CM | POA: Diagnosis not present

## 2019-09-30 DIAGNOSIS — I69354 Hemiplegia and hemiparesis following cerebral infarction affecting left non-dominant side: Secondary | ICD-10-CM | POA: Diagnosis not present

## 2019-09-30 DIAGNOSIS — Z7982 Long term (current) use of aspirin: Secondary | ICD-10-CM | POA: Diagnosis not present

## 2019-09-30 DIAGNOSIS — I69398 Other sequelae of cerebral infarction: Secondary | ICD-10-CM | POA: Diagnosis not present

## 2019-09-30 DIAGNOSIS — G43909 Migraine, unspecified, not intractable, without status migrainosus: Secondary | ICD-10-CM | POA: Diagnosis not present

## 2019-09-30 DIAGNOSIS — Z9181 History of falling: Secondary | ICD-10-CM | POA: Diagnosis not present

## 2019-10-04 DIAGNOSIS — Z7901 Long term (current) use of anticoagulants: Secondary | ICD-10-CM | POA: Diagnosis not present

## 2019-10-04 DIAGNOSIS — Z9181 History of falling: Secondary | ICD-10-CM | POA: Diagnosis not present

## 2019-10-04 DIAGNOSIS — Z7982 Long term (current) use of aspirin: Secondary | ICD-10-CM | POA: Diagnosis not present

## 2019-10-04 DIAGNOSIS — H547 Unspecified visual loss: Secondary | ICD-10-CM | POA: Diagnosis not present

## 2019-10-04 DIAGNOSIS — G43909 Migraine, unspecified, not intractable, without status migrainosus: Secondary | ICD-10-CM | POA: Diagnosis not present

## 2019-10-04 DIAGNOSIS — I69354 Hemiplegia and hemiparesis following cerebral infarction affecting left non-dominant side: Secondary | ICD-10-CM | POA: Diagnosis not present

## 2019-10-04 DIAGNOSIS — I69398 Other sequelae of cerebral infarction: Secondary | ICD-10-CM | POA: Diagnosis not present

## 2019-10-05 DIAGNOSIS — Z7901 Long term (current) use of anticoagulants: Secondary | ICD-10-CM | POA: Diagnosis not present

## 2019-10-05 DIAGNOSIS — I69354 Hemiplegia and hemiparesis following cerebral infarction affecting left non-dominant side: Secondary | ICD-10-CM | POA: Diagnosis not present

## 2019-10-05 DIAGNOSIS — G43909 Migraine, unspecified, not intractable, without status migrainosus: Secondary | ICD-10-CM | POA: Diagnosis not present

## 2019-10-05 DIAGNOSIS — Z9181 History of falling: Secondary | ICD-10-CM | POA: Diagnosis not present

## 2019-10-05 DIAGNOSIS — H547 Unspecified visual loss: Secondary | ICD-10-CM | POA: Diagnosis not present

## 2019-10-05 DIAGNOSIS — Z7982 Long term (current) use of aspirin: Secondary | ICD-10-CM | POA: Diagnosis not present

## 2019-10-05 DIAGNOSIS — I69398 Other sequelae of cerebral infarction: Secondary | ICD-10-CM | POA: Diagnosis not present

## 2019-10-06 DIAGNOSIS — I69354 Hemiplegia and hemiparesis following cerebral infarction affecting left non-dominant side: Secondary | ICD-10-CM | POA: Diagnosis not present

## 2019-10-06 DIAGNOSIS — I69398 Other sequelae of cerebral infarction: Secondary | ICD-10-CM | POA: Diagnosis not present

## 2019-10-06 DIAGNOSIS — Z9181 History of falling: Secondary | ICD-10-CM | POA: Diagnosis not present

## 2019-10-06 DIAGNOSIS — H547 Unspecified visual loss: Secondary | ICD-10-CM | POA: Diagnosis not present

## 2019-10-06 DIAGNOSIS — G43909 Migraine, unspecified, not intractable, without status migrainosus: Secondary | ICD-10-CM | POA: Diagnosis not present

## 2019-10-06 DIAGNOSIS — Z7901 Long term (current) use of anticoagulants: Secondary | ICD-10-CM | POA: Diagnosis not present

## 2019-10-06 DIAGNOSIS — Z7982 Long term (current) use of aspirin: Secondary | ICD-10-CM | POA: Diagnosis not present

## 2019-10-07 DIAGNOSIS — I69354 Hemiplegia and hemiparesis following cerebral infarction affecting left non-dominant side: Secondary | ICD-10-CM | POA: Diagnosis not present

## 2019-10-07 DIAGNOSIS — G43909 Migraine, unspecified, not intractable, without status migrainosus: Secondary | ICD-10-CM | POA: Diagnosis not present

## 2019-10-07 DIAGNOSIS — Z7982 Long term (current) use of aspirin: Secondary | ICD-10-CM | POA: Diagnosis not present

## 2019-10-07 DIAGNOSIS — Z9181 History of falling: Secondary | ICD-10-CM | POA: Diagnosis not present

## 2019-10-07 DIAGNOSIS — Z7901 Long term (current) use of anticoagulants: Secondary | ICD-10-CM | POA: Diagnosis not present

## 2019-10-07 DIAGNOSIS — H547 Unspecified visual loss: Secondary | ICD-10-CM | POA: Diagnosis not present

## 2019-10-07 DIAGNOSIS — I69398 Other sequelae of cerebral infarction: Secondary | ICD-10-CM | POA: Diagnosis not present

## 2019-10-10 DIAGNOSIS — I69398 Other sequelae of cerebral infarction: Secondary | ICD-10-CM | POA: Diagnosis not present

## 2019-10-10 DIAGNOSIS — G43909 Migraine, unspecified, not intractable, without status migrainosus: Secondary | ICD-10-CM | POA: Diagnosis not present

## 2019-10-10 DIAGNOSIS — H547 Unspecified visual loss: Secondary | ICD-10-CM | POA: Diagnosis not present

## 2019-10-10 DIAGNOSIS — Z7982 Long term (current) use of aspirin: Secondary | ICD-10-CM | POA: Diagnosis not present

## 2019-10-10 DIAGNOSIS — Z7901 Long term (current) use of anticoagulants: Secondary | ICD-10-CM | POA: Diagnosis not present

## 2019-10-10 DIAGNOSIS — I69354 Hemiplegia and hemiparesis following cerebral infarction affecting left non-dominant side: Secondary | ICD-10-CM | POA: Diagnosis not present

## 2019-10-10 DIAGNOSIS — Z9181 History of falling: Secondary | ICD-10-CM | POA: Diagnosis not present

## 2019-10-11 DIAGNOSIS — Z7901 Long term (current) use of anticoagulants: Secondary | ICD-10-CM | POA: Diagnosis not present

## 2019-10-11 DIAGNOSIS — Z9181 History of falling: Secondary | ICD-10-CM | POA: Diagnosis not present

## 2019-10-11 DIAGNOSIS — H547 Unspecified visual loss: Secondary | ICD-10-CM | POA: Diagnosis not present

## 2019-10-11 DIAGNOSIS — Z7982 Long term (current) use of aspirin: Secondary | ICD-10-CM | POA: Diagnosis not present

## 2019-10-11 DIAGNOSIS — I69354 Hemiplegia and hemiparesis following cerebral infarction affecting left non-dominant side: Secondary | ICD-10-CM | POA: Diagnosis not present

## 2019-10-11 DIAGNOSIS — G43909 Migraine, unspecified, not intractable, without status migrainosus: Secondary | ICD-10-CM | POA: Diagnosis not present

## 2019-10-11 DIAGNOSIS — I69398 Other sequelae of cerebral infarction: Secondary | ICD-10-CM | POA: Diagnosis not present

## 2019-10-12 DIAGNOSIS — I69398 Other sequelae of cerebral infarction: Secondary | ICD-10-CM | POA: Diagnosis not present

## 2019-10-12 DIAGNOSIS — I69354 Hemiplegia and hemiparesis following cerebral infarction affecting left non-dominant side: Secondary | ICD-10-CM | POA: Diagnosis not present

## 2019-10-12 DIAGNOSIS — Z7982 Long term (current) use of aspirin: Secondary | ICD-10-CM | POA: Diagnosis not present

## 2019-10-12 DIAGNOSIS — Z7901 Long term (current) use of anticoagulants: Secondary | ICD-10-CM | POA: Diagnosis not present

## 2019-10-12 DIAGNOSIS — H547 Unspecified visual loss: Secondary | ICD-10-CM | POA: Diagnosis not present

## 2019-10-12 DIAGNOSIS — G43909 Migraine, unspecified, not intractable, without status migrainosus: Secondary | ICD-10-CM | POA: Diagnosis not present

## 2019-10-12 DIAGNOSIS — Z9181 History of falling: Secondary | ICD-10-CM | POA: Diagnosis not present

## 2019-10-13 DIAGNOSIS — Z7982 Long term (current) use of aspirin: Secondary | ICD-10-CM | POA: Diagnosis not present

## 2019-10-13 DIAGNOSIS — I69354 Hemiplegia and hemiparesis following cerebral infarction affecting left non-dominant side: Secondary | ICD-10-CM | POA: Diagnosis not present

## 2019-10-13 DIAGNOSIS — Z7901 Long term (current) use of anticoagulants: Secondary | ICD-10-CM | POA: Diagnosis not present

## 2019-10-13 DIAGNOSIS — Z9181 History of falling: Secondary | ICD-10-CM | POA: Diagnosis not present

## 2019-10-13 DIAGNOSIS — G43909 Migraine, unspecified, not intractable, without status migrainosus: Secondary | ICD-10-CM | POA: Diagnosis not present

## 2019-10-13 DIAGNOSIS — H547 Unspecified visual loss: Secondary | ICD-10-CM | POA: Diagnosis not present

## 2019-10-13 DIAGNOSIS — I69398 Other sequelae of cerebral infarction: Secondary | ICD-10-CM | POA: Diagnosis not present

## 2019-10-18 DIAGNOSIS — I69354 Hemiplegia and hemiparesis following cerebral infarction affecting left non-dominant side: Secondary | ICD-10-CM | POA: Diagnosis not present

## 2019-10-18 DIAGNOSIS — Z7901 Long term (current) use of anticoagulants: Secondary | ICD-10-CM | POA: Diagnosis not present

## 2019-10-18 DIAGNOSIS — Z7982 Long term (current) use of aspirin: Secondary | ICD-10-CM | POA: Diagnosis not present

## 2019-10-18 DIAGNOSIS — H547 Unspecified visual loss: Secondary | ICD-10-CM | POA: Diagnosis not present

## 2019-10-18 DIAGNOSIS — G43909 Migraine, unspecified, not intractable, without status migrainosus: Secondary | ICD-10-CM | POA: Diagnosis not present

## 2019-10-18 DIAGNOSIS — I69398 Other sequelae of cerebral infarction: Secondary | ICD-10-CM | POA: Diagnosis not present

## 2019-10-18 DIAGNOSIS — Z9181 History of falling: Secondary | ICD-10-CM | POA: Diagnosis not present

## 2019-10-20 DIAGNOSIS — Z9181 History of falling: Secondary | ICD-10-CM | POA: Diagnosis not present

## 2019-10-20 DIAGNOSIS — G43909 Migraine, unspecified, not intractable, without status migrainosus: Secondary | ICD-10-CM | POA: Diagnosis not present

## 2019-10-20 DIAGNOSIS — I69398 Other sequelae of cerebral infarction: Secondary | ICD-10-CM | POA: Diagnosis not present

## 2019-10-20 DIAGNOSIS — Z7982 Long term (current) use of aspirin: Secondary | ICD-10-CM | POA: Diagnosis not present

## 2019-10-20 DIAGNOSIS — H547 Unspecified visual loss: Secondary | ICD-10-CM | POA: Diagnosis not present

## 2019-10-20 DIAGNOSIS — Z7901 Long term (current) use of anticoagulants: Secondary | ICD-10-CM | POA: Diagnosis not present

## 2019-10-20 DIAGNOSIS — I69354 Hemiplegia and hemiparesis following cerebral infarction affecting left non-dominant side: Secondary | ICD-10-CM | POA: Diagnosis not present

## 2019-10-26 ENCOUNTER — Encounter: Payer: Self-pay | Admitting: Neurology

## 2019-10-26 ENCOUNTER — Ambulatory Visit: Payer: Medicare Other | Admitting: Neurology

## 2019-10-26 VITALS — BP 125/86 | HR 86 | Ht 67.0 in | Wt 138.4 lb

## 2019-10-26 DIAGNOSIS — I6621 Occlusion and stenosis of right posterior cerebral artery: Secondary | ICD-10-CM | POA: Diagnosis not present

## 2019-10-26 DIAGNOSIS — R200 Anesthesia of skin: Secondary | ICD-10-CM

## 2019-10-26 DIAGNOSIS — R413 Other amnesia: Secondary | ICD-10-CM | POA: Diagnosis not present

## 2019-10-26 DIAGNOSIS — I6349 Cerebral infarction due to embolism of other cerebral artery: Secondary | ICD-10-CM | POA: Diagnosis not present

## 2019-10-26 DIAGNOSIS — I671 Cerebral aneurysm, nonruptured: Secondary | ICD-10-CM | POA: Diagnosis not present

## 2019-10-26 MED ORDER — PREVAGEN 10 MG PO CAPS: 1.0000 | ORAL_CAPSULE | Freq: Every morning | ORAL | 1 refills | Status: DC

## 2019-10-26 NOTE — Progress Notes (Signed)
[facility] [address] [place]. [place] [address] [phone]       OFFICE CONSULT NOTE  [name] Date of Birth:  [birth year] Medical Record Number:  [id number]   Referring MD: Allyn Kenner Reason for Referral: Stroke  HPI: [name] is a pleasant 64 year old Caucasian lady seen today for initial office consultation visit for stroke.  She has past medical history of hypertension, chronic kidney disease and anxiety.  She presented to [facility] on 09/19/2019 with sudden onset of dizziness, headache and fell when she got out of bed.  She had to call EMS as she was staggering and unable to walk.  She was admitted to [facility] where MRI scan showed a right posterior cerebral artery infarct and CT angio showed right PCA occlusion in the P2 segment.  CT perfusion scan did not show significant penumbra to consider mechanical thrombectomy.  Echocardiogram showed normal ejection fraction without cardiac source of embolism.  LDL cholesterol 153 mg percent.  Hemoglobin A1c was 5.3.  Urine drug screen was negative.  Patient was transferred for rehabilitation to skilled nursing facility and is currently living at home.  She states that her peripheral vision loss is improved her gait and balance are also much better.  She has recently finished home physical and occupational therapy.  She uses a cane while walking long distances only but can walk indoors without it.  She has had no recent falls or has not bumped into any objects.  She does complain of decreased short-term memory since her stroke as well as residual numbness in the left hand and leg.  Occasionally she drags her left leg.  She is too scared to drive.  She is tolerating aspirin without bruising or bleeding and Lipitor without muscle aches and pains.  Her blood pressure is well controlled today it is 125/84. ROS:   14 system review of systems is positive for loss of vision, easy bruising, headache,  allergies, gait difficulties and all other systems negative  PMH:  Past Medical History:  Diagnosis Date  . Allergy   . Anxiety   . Arthritis   . Chronic kidney disease    kidney donor, has only one kidney  . Hypertension   . 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 Migraines   . Stroke ([place]) 09/19/2019   Left side & vision  . Thyroid disease    Hx of     Social History:  Social History   Socioeconomic History  . Marital status: Single    Spouse name: Not on file  . Number of children: 5  . Years of education: Not on file  . Highest education level: Not on file  Occupational History  . Occupation: Disabled    Employer: Therapist, occupational    Comment: assembly line  Tobacco Use  . Smoking status: Former Smoker    Packs/day: 0.25    Years: 54.00    Pack years: 13.50    Types: Cigarettes, E-cigarettes    Quit date: 10/25/2014    Years since quitting: 5.0  . Smokeless tobacco: Never Used  Vaping Use  . Vaping Use: Every day  Substance and Sexual Activity  . Alcohol use: No  . Drug use: No  . Sexual activity: Not on file  Other Topics Concern  . Not on file  Social History Narrative   Unemployed: Single, DISABLED FOR 4 YRS-RIGHT SHOULDER. 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  She has 5 grown children.    Smoker: 1/2 pk daily-QUIT 5 YRS AGO   Pt donated kidney to daughter 5 yrs ago.   Lives Alone   Right Handed    Drinks 1 cup caffeine daily      Social Determinants of Health   Financial Resource Strain:   . Difficulty of Paying Living Expenses: Not on file  Food Insecurity:   . Worried About Charity fundraiser in the Last Year: Not on file  . Ran Out of Food in the Last Year: Not on file  Transportation Needs:   . Lack of Transportation (Medical): Not on file  . Lack of Transportation (Non-Medical): Not on file  Physical Activity:   . Days of Exercise per Week: Not on file  . Minutes of Exercise per Session: Not on file  Stress:   . Feeling  of Stress : Not on file  Social Connections:   . Frequency of Communication with Friends and Family: Not on file  . Frequency of Social Gatherings with Friends and Family: Not on file  . Attends Religious Services: Not on file  . Active Member of Clubs or Organizations: Not on file  . Attends Archivist Meetings: Not on file  . Marital Status: Not on file  Intimate Partner Violence:   . Fear of Current or Ex-Partner: Not on file  . Emotionally Abused: Not on file  . Physically Abused: Not on file  . Sexually Abused: Not on file    Medications:   Current Outpatient Medications on File Prior to Visit  Medication Sig Dispense Refill  . acetaminophen (TYLENOL) 325 MG tablet Take 2 tablets (650 mg total) by mouth every 4 (four) hours as needed for mild pain (or temp > 37.5 C (99.5 F)).    Marland Kitchen amLODipine (NORVASC) 5 MG tablet Take 5 mg by mouth daily.     Marland Kitchen aspirin EC 81 MG EC tablet Take 1 tablet (81 mg total) by mouth daily. Swallow whole. 30 tablet 11  . atorvastatin (LIPITOR) 80 MG tablet Take 1 tablet (80 mg total) by mouth every evening. 30 tablet 11  . fexofenadine (ALLEGRA) 180 MG tablet Take 180 mg by mouth daily as needed for allergies.     . fluticasone (FLONASE) 50 MCG/ACT nasal spray Place 2 sprays into both nostrils daily.  2  . meclizine (ANTIVERT) 25 MG tablet Take 25 mg by mouth 3 (three) times daily as needed for dizziness.    . SUMAtriptan (IMITREX) 25 MG tablet Take 1 tablet (25 mg total) by mouth every 2 (two) hours as needed for up to 2 doses for migraine or headache. May repeat in 2 hours if headache persists or recurs. 10 tablet 1   No current facility-administered medications on file prior to visit.    Allergies:   Allergies  Allergen Reactions  . Celebrex [Celecoxib] Anaphylaxis and Hives  . Sulfonamide Derivatives Anaphylaxis, Swelling and Rash  . Ace Inhibitors Cough  . Dilaudid [Hydromorphone] Nausea And Vomiting    Severe N/V  . Erythromycin  Nausea And Vomiting  . Lactose Intolerance (Gi) Diarrhea    cramping  . Sudafed [Pseudoephedrine Hcl]     Makes "high"  . Losartan Palpitations  . Mucinex [Guaifenesin Er] Palpitations    Physical Exam General: Frail middle-age Caucasian lady seated, in no evident distress Head: head normocephalic and atraumatic.   Neck: supple with no carotid or supraclavicular bruits Cardiovascular: regular rate and rhythm, no murmurs Musculoskeletal: no  deformity Skin:  no rash/petichiae Vascular:  Normal pulses all extremities  Neurologic Exam Mental Status: Awake and fully alert. Oriented to place and time. Recent and remote memory intact. Attention span, concentration and fund of knowledge appropriate. Mood and affect appropriate.  Diminished recall 2/3.  Able to name 9 animals that can walk on 4 legs. Cranial Nerves: Fundoscopic exam reveals sharp disc margins. Pupils equal, briskly reactive to light. Extraocular movements full without nystagmus. Visual fields full to confrontation. Hearing intact. Facial sensation intact. Face, tongue, palate moves normally and symmetrically.  Motor: Normal bulk and tone. Normal strength in all tested extremity muscles. Sensory.: intact to touch , pinprick , position and vibratory sensation.  Coordination: Mild left finger-to-nose and knee to heel incoordination. Gait and Station: Arises from chair without difficulty. Stance is normal. Gait demonstrates wide base and mild ataxia with slight dragging of the left leg.   Reflexes: 1+ and symmetric. Toes downgoing.   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  Vascular risk factors of hypertension, hyperlipidemia and age.     PLAN: I had a long d/w patient and her friend about her recent embolic stroke, risk for recurrent stroke/TIAs, personally independently  reviewed imaging studies and stroke evaluation results and answered questions.Continue aspirin 81 mg daily  for secondary stroke prevention and maintain strict control of hypertension with blood pressure goal below 130/90, diabetes with hemoglobin A1c goal below 6.5% and lipids with LDL cholesterol goal below 70 mg/dL. I also advised the patient to eat a healthy diet with plenty of whole grains, cereals, fruits and vegetables, exercise regularly and maintain ideal body weight.  Check the 30-day cardiac event monitor for paroxysmal A. fib.  We also discussed starting prevention 10 mg daily for her memory loss and doing memory compensation strategies.  Greater than 50% time during this 45-minute consultation visit was spent on counseling and coordination of care about her embolic stroke and answering questions.  Followup in the future with me in 3 months or call earlier if necessary. Antony Contras, MD Note: This document was prepared with digital dictation and possible smart phrase technology. Any transcriptional errors that result from this process are unintentional.

## 2019-10-26 NOTE — Patient Instructions (Signed)
I had a long d/w patient and her friend about her recent embolic stroke, risk for recurrent stroke/TIAs, personally independently reviewed imaging studies and stroke evaluation results and answered questions.Continue aspirin 81 mg daily  for secondary stroke prevention and maintain strict control of hypertension with blood pressure goal below 130/90, diabetes with hemoglobin A1c goal below 6.5% and lipids with LDL cholesterol goal below 70 mg/dL. I also advised the patient to eat a healthy diet with plenty of whole grains, cereals, fruits and vegetables, exercise regularly and maintain ideal body weight.  Check the 30-day cardiac event monitor for paroxysmal A. fib.  We also discussed starting prevention 10 mg daily for her memory loss and doing memory compensation strategies.  Followup in the future with me in 3 months or call earlier if necessary. Memory Compensation Strategies  1. Use "WARM" strategy.  W= write it down  A= associate it  R= repeat it  M= make a mental note  2.   You can keep a Social worker.  Use a 3-ring notebook with sections for the following: calendar, important names and phone numbers,  medications, doctors' names/phone numbers, lists/reminders, and a section to journal what you did  each day.   3.    Use a calendar to write appointments down.  4.    Write yourself a schedule for the day.  This can be placed on the calendar or in a separate section of the Memory Notebook.  Keeping a  regular schedule can help memory.  5.    Use medication organizer with sections for each day or morning/evening pills.  You may need help loading it  6.    Keep a basket, or pegboard by the door.  Place items that you need to take out with you in the basket or on the pegboard.  You may also want to  include a message board for reminders.  7.    Use sticky notes.  Place sticky notes with reminders in a place where the task is performed.  For example: " turn off the  stove" placed by the  stove, "lock the door" placed on the door at eye level, " take your medications" on  the bathroom mirror or by the place where you normally take your medications.  8.    Use alarms/timers.  Use while cooking to remind yourself to check on food or as a reminder to take your medicine, or as a  reminder to make a call, or as a reminder to perform another task, etc.

## 2019-10-28 ENCOUNTER — Telehealth: Payer: Self-pay | Admitting: Neurology

## 2019-10-28 NOTE — Telephone Encounter (Signed)
Pt called, have not heard when I will get my heart monitor. Would like a call from the nurse.

## 2019-10-31 NOTE — Telephone Encounter (Signed)
Called patient to discuss procedure of receiving heart monitor.  Assured patient that order was sent to [facility] and is still being processed.  Educated patient on procedure of receiving monitor and they will contact her and make the arrangements with her.  Patient expressed appreciation.  Patient stated that she read about a 'stroke' occurring in 2015 in her MyChart and she has never been notified of this.  She asked how to contact the MD who charted that event, I suggested she contact the facility ([facility]) where that MD works.

## 2019-11-01 ENCOUNTER — Telehealth: Payer: Self-pay | Admitting: Neurology

## 2019-11-01 NOTE — Telephone Encounter (Signed)
Pt called wanting to know if she can get a referral to a Cardiologist. Please advise.

## 2019-11-02 NOTE — Telephone Encounter (Signed)
Kindly inform the patient that she has a cardiac event monitor ordered and if the results of that show that she has any worrisome arrhythmias we will refer her to a cardiologist.  If the test comes back okay she does not need a cardiology referral

## 2019-11-02 NOTE — Telephone Encounter (Signed)
Forwarded to Dr. Leonie Man

## 2019-11-03 ENCOUNTER — Encounter (HOSPITAL_COMMUNITY): Payer: Self-pay | Admitting: Emergency Medicine

## 2019-11-03 ENCOUNTER — Emergency Department (HOSPITAL_COMMUNITY): Payer: Medicare Other

## 2019-11-03 ENCOUNTER — Other Ambulatory Visit: Payer: Self-pay

## 2019-11-03 ENCOUNTER — Emergency Department (HOSPITAL_COMMUNITY)
Admission: EM | Admit: 2019-11-03 | Discharge: 2019-11-03 | Disposition: A | Discharge status: Home / Self Care | Payer: Medicare Other | Attending: Emergency Medicine | Admitting: Emergency Medicine

## 2019-11-03 DIAGNOSIS — R079 Chest pain, unspecified: Secondary | ICD-10-CM | POA: Diagnosis not present

## 2019-11-03 DIAGNOSIS — R21 Rash and other nonspecific skin eruption: Secondary | ICD-10-CM | POA: Diagnosis not present

## 2019-11-03 DIAGNOSIS — R0789 Other chest pain: Secondary | ICD-10-CM | POA: Diagnosis not present

## 2019-11-03 DIAGNOSIS — I129 Hypertensive chronic kidney disease with stage 1 through stage 4 chronic kidney disease, or unspecified chronic kidney disease: Secondary | ICD-10-CM | POA: Diagnosis not present

## 2019-11-03 DIAGNOSIS — Z79899 Other long term (current) drug therapy: Secondary | ICD-10-CM | POA: Diagnosis not present

## 2019-11-03 DIAGNOSIS — Z87891 Personal history of nicotine dependence: Secondary | ICD-10-CM | POA: Insufficient documentation

## 2019-11-03 DIAGNOSIS — I700 Atherosclerosis of aorta: Secondary | ICD-10-CM | POA: Diagnosis not present

## 2019-11-03 DIAGNOSIS — J439 Emphysema, unspecified: Secondary | ICD-10-CM | POA: Diagnosis not present

## 2019-11-03 DIAGNOSIS — J449 Chronic obstructive pulmonary disease, unspecified: Secondary | ICD-10-CM | POA: Diagnosis not present

## 2019-11-03 DIAGNOSIS — Z7982 Long term (current) use of aspirin: Secondary | ICD-10-CM | POA: Insufficient documentation

## 2019-11-03 DIAGNOSIS — N189 Chronic kidney disease, unspecified: Secondary | ICD-10-CM | POA: Insufficient documentation

## 2019-11-03 LAB — CBC
HCT: 43.8 % (ref 36.0–46.0)
Hemoglobin: 14.3 g/dL (ref 12.0–15.0)
MCH: 29.3 pg (ref 26.0–34.0)
MCHC: 32.6 g/dL (ref 30.0–36.0)
MCV: 89.8 fL (ref 80.0–100.0)
Platelets: 216 10*3/uL (ref 150–400)
RBC: 4.88 MIL/uL (ref 3.87–5.11)
RDW: 12.1 % (ref 11.5–15.5)
WBC: 6.2 10*3/uL (ref 4.0–10.5)
nRBC: 0 % (ref 0.0–0.2)

## 2019-11-03 LAB — TROPONIN I (HIGH SENSITIVITY)
Troponin I (High Sensitivity): 6 ng/L (ref <18)
Troponin I (High Sensitivity): 6 ng/L (ref <18)

## 2019-11-03 LAB — BASIC METABOLIC PANEL
Anion gap: 8 (ref 5–15)
BUN: 15 mg/dL (ref 8–23)
CO2: 28 mmol/L (ref 22–32)
Calcium: 9.8 mg/dL (ref 8.9–10.3)
Chloride: 104 mmol/L (ref 98–111)
Creatinine, Ser: 1.16 mg/dL — ABNORMAL HIGH (ref 0.44–1.00)
GFR, Estimated: 52 mL/min — ABNORMAL LOW (ref >60)
Glucose, Bld: 108 mg/dL — ABNORMAL HIGH (ref 70–99)
Potassium: 3.9 mmol/L (ref 3.5–5.1)
Sodium: 140 mmol/L (ref 135–145)

## 2019-11-03 NOTE — Discharge Instructions (Addendum)
Follow-up with cardiology for the heart monitor.  If you have any recurrent chest pain, return to the emergency department.

## 2019-11-03 NOTE — Telephone Encounter (Signed)
Called patient back and gave her [facility] [place] information.  Patient asked if I had heard anymore and if she was getting her heart monitor today.  I explained it had only been 5 hours since I spoke to her this morning and I haven't heard anything else about when she would get it.  Patient expressed appreciation and stated she may call her insurance company to see if they can get it to her today.

## 2019-11-03 NOTE — Telephone Encounter (Signed)
Called patient and discussed dr. Clydene Fake response.  Educated patient that she must have cardiac monitor study done and resulted before decision made for any referrals.  Assured patient that cardiac monitor is ordered and is processing, needs scheduled at this time.  Patient expressed appreciation.

## 2019-11-03 NOTE — ED Provider Notes (Signed)
[facility] EMERGENCY DEPARTMENT Provider Note   CSN: [id number] Arrival date & time: 11/03/19  1611     History Chief Complaint  Patient presents with  . Chest Pain    [name] is a 64 y.o. female.  Patient presents to emergency department for evaluation of chest pain.  Patient reports that she has been feeling very anxious since she had a stroke in September.  Patient reports that the pain has now resolved.  She does not have any heart issues.  She is awaiting a heart monitor as part of her work-up for the stroke that she had.  No associated shortness of breath.        Past Medical History:  Diagnosis Date  . Allergy   . Anxiety   . Arthritis   . Chronic kidney disease    kidney donor, has only one kidney  . Hypertension   . 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 Migraines   . Stroke ([place]) 09/19/2019   Left side & vision  . Thyroid disease    Hx of     Patient Active Problem List   Diagnosis Date Noted  . CVA (cerebral vascular accident) ([place]) 09/19/2019  . Osteoarthritis of carpometacarpal [facility]) joint of right thumb 08/17/2018  . Constipation 07/08/2018  . Abdominal pain, left upper quadrant 07/08/2018  . Pain in right wrist 05/13/2018  . Chronic kidney disease 11/06/2015  . 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 Cough variant not due to asthma 05/16/2014  . Essential hypertension, benign 05/16/2014  . Rotator cuff impingement syndrome of right shoulder 04/25/2014  . 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  . Thyroiditis, subacute 12/22/2013  . Right thyroid nodule 12/22/2013  . Irritability 12/08/2013  . Arthritis 12/08/2013  . Joint pain 10/03/2013  . 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 History of nephrectomy 08/10/2013  . Paresthesia of both hands 08/09/2013  . C O P D 03/05/2007    Past Surgical History:  Procedure Laterality Date  . ABDOMINAL HYSTERECTOMY  1991   partial  . BLADDER  SURGERY     bladder tac, then bladder tack reversal due to adhesions.  . DORSAL COMPARTMENT RELEASE Right 02/07/2013   Procedure: RIGHT WRIST DEQUERVAIN RELEASE;  Surgeon: Jolyn Nap, MD;  Location: [facility];  Service: Orthopedics;  Laterality: Right;  . KIDNEY DONATION  2008   gave her lt kidney to daughter  . NECK MASS EXCISION     rt-mass  . NECK SURGERY  1990   rt ? parotidectomy  . SHOULDER ARTHROSCOPY WITH OPEN ROTATOR CUFF REPAIR AND DISTAL CLAVICLE ACROMINECTOMY Right 04/25/2014   Procedure: SHOULDER ARTHROSCOPY WITH MINI-OPEN ROTATOR CUFF REPAIR AND DISTAL CLAVICLE RESECTION;  Surgeon: Garald Balding, MD;  Location: [facility];  Service: Orthopedics;  Laterality: Right;  . SHOULDER CLOSED REDUCTION Right 08/31/2014   Procedure: CLOSED MANIPULATION SHOULDER;  Surgeon: Garald Balding, MD;  Location: [facility];  Service: Orthopedics;  Laterality: Right;  . SUBACROMIAL DECOMPRESSION Right 04/25/2014   Procedure: SUBACROMIAL DECOMPRESSION;  Surgeon: Garald Balding, MD;  Location: [facility];  Service: Orthopedics;  Laterality: Right;  . THYROID SURGERY     bx     OB History   No obstetric history on file.     Family History  Problem Relation Age of Onset  . Arthritis Mother        rheumatoid  .  Hypertension Mother   . Heart disease Mother        Irregular heart beat   . Arrhythmia Mother   . Rheum arthritis Mother   . Pulmonary fibrosis Mother   . Cancer Father        Lung  . COPD Father   . Hypertension Sister   . Aneurysm Brother 11       brain  . Heart disease Maternal Aunt   . Pulmonary fibrosis Maternal Aunt   . Arthritis Maternal Uncle        rheumatoid  . Pulmonary fibrosis Maternal Uncle   . Heart disease Maternal Grandmother   . Diabetes Maternal Uncle   . Hypertension Sister   . Colon cancer Neg Hx   . Colon polyps Neg Hx     Social History   Tobacco Use  . Smoking status:  Former Smoker    Packs/day: 0.25    Years: 54.00    Pack years: 13.50    Types: Cigarettes, E-cigarettes    Quit date: 10/25/2014    Years since quitting: 5.0  . Smokeless tobacco: Never Used  Vaping Use  . Vaping Use: Every day  Substance Use Topics  . Alcohol use: No  . Drug use: No    Home Medications Prior to Admission medications   Medication Sig Start Date End Date Taking? Authorizing Provider  acetaminophen (TYLENOL) 325 MG tablet Take 2 tablets (650 mg total) by mouth every 4 (four) hours as needed for mild pain (or temp > 37.5 C (99.5 F)). 09/21/19   Johnson, Clanford L, MD  amLODipine (NORVASC) 5 MG tablet Take 5 mg by mouth daily.  04/15/17   [provider]  Apoaequorin (PREVAGEN) 10 MG CAPS Take 1 capsule by mouth every morning. 10/26/19   Garvin Fila, MD  aspirin EC 81 MG EC tablet Take 1 tablet (81 mg total) by mouth daily. Swallow whole. 09/22/19   Johnson, Clanford L, MD  atorvastatin (LIPITOR) 80 MG tablet Take 1 tablet (80 mg total) by mouth every evening. 09/21/19 09/20/20  Johnson, Clanford L, MD  fexofenadine (ALLEGRA) 180 MG tablet Take 180 mg by mouth daily as needed for allergies.     [provider]  fluticasone (FLONASE) 50 MCG/ACT nasal spray Place 2 sprays into both nostrils daily. 09/22/19   Johnson, Clanford L, MD  meclizine (ANTIVERT) 25 MG tablet Take 25 mg by mouth 3 (three) times daily as needed for dizziness. 09/16/19   [provider]  SUMAtriptan (IMITREX) 25 MG tablet Take 1 tablet (25 mg total) by mouth every 2 (two) hours as needed for up to 2 doses for migraine or headache. May repeat in 2 hours if headache persists or recurs. 09/23/19   Johnson, Clanford L, MD    Allergies    Celebrex [celecoxib], Sulfonamide derivatives, Ace inhibitors, Dilaudid [hydromorphone], Erythromycin, Lactose intolerance (gi), Sudafed [pseudoephedrine hcl], Losartan, and Mucinex [guaifenesin er]  Review of Systems   Review of Systems    Cardiovascular: Positive for chest pain.  All other systems reviewed and are negative.   Physical Exam Updated Vital Signs BP (!) 136/94 (BP Location: Right Arm)   Pulse 88   Temp 97.7 F (36.5 C) (Oral)   Resp 17   Ht 5\' 7"  (1.702 m)   Wt 64.4 kg   SpO2 100%   BMI 22.24 kg/m   Physical Exam Vitals and nursing note reviewed.  Constitutional:      General: She is not in acute  distress.    Appearance: Normal appearance. She is well-developed.  HENT:     Head: Normocephalic and atraumatic.     Right Ear: Hearing normal.     Left Ear: Hearing normal.     Nose: Nose normal.  Eyes:     Conjunctiva/sclera: Conjunctivae normal.     Pupils: Pupils are equal, round, and reactive to light.  Cardiovascular:     Rate and Rhythm: Regular rhythm.     Heart sounds: S1 normal and S2 normal. No murmur heard.  No friction rub. No gallop.   Pulmonary:     Effort: Pulmonary effort is normal. No respiratory distress.     Breath sounds: Normal breath sounds.  Chest:     Chest wall: No tenderness.  Abdominal:     General: Bowel sounds are normal.     Palpations: Abdomen is soft.     Tenderness: There is no abdominal tenderness. There is no guarding or rebound. Negative signs include Murphy's sign and McBurney's sign.     Hernia: No hernia is present.  Musculoskeletal:        General: Normal range of motion.     Cervical back: Normal range of motion and neck supple.  Skin:    General: Skin is warm and dry.     Findings: No rash.  Neurological:     Mental Status: She is alert and oriented to person, place, and time.     GCS: GCS eye subscore is 4. GCS verbal subscore is 5. GCS motor subscore is 6.     Cranial Nerves: No cranial nerve deficit.     Sensory: No sensory deficit.     Coordination: Coordination normal.  Psychiatric:        Mood and Affect: Mood is anxious.        Speech: Speech normal.        Behavior: Behavior normal.        Thought Content: Thought content normal.      ED Results / Procedures / Treatments   Labs (all labs ordered are listed, but only abnormal results are displayed) Labs Reviewed  BASIC METABOLIC PANEL - Abnormal; Notable for the following components:      Result Value   Glucose, Bld 108 (*)    Creatinine, Ser 1.16 (*)    GFR, Estimated 52 (*)    All other components within normal limits  CBC  TROPONIN I (HIGH SENSITIVITY)  TROPONIN I (HIGH SENSITIVITY)    EKG EKG Interpretation  Date/Time:  Thursday November 03 2019 16:19:14 EDT Ventricular Rate:  88 PR Interval:  130 QRS Duration: 106 QT Interval:  382 QTC Calculation: 462 R Axis:   -55 Text Interpretation: Normal sinus rhythm Incomplete right bundle branch block Left anterior fascicular block Abnormal ECG No significant change since last tracing Confirmed by Orpah Greek [phone]) on 11/03/2019 7:14:03 PM   Radiology DG Chest 2 View  Result Date: 11/03/2019 CLINICAL DATA:  64 year old female with chest pain. EXAM: CHEST - 2 VIEW COMPARISON:  Chest radiograph dated 10/17/2017. FINDINGS: There is emphysematous changes of the lungs. No focal consolidation, pleural effusion or pneumothorax. The cardiac silhouette is within limits. Atherosclerotic calcification of the aortic arch. No acute osseous pathology. Upper abdominal vascular clips. IMPRESSION: No active cardiopulmonary disease.  Emphysema. Electronically Signed   By: Anner Crete M.D.   On: 11/03/2019 17:20    Procedures Procedures (including critical care time)  Medications Ordered in ED Medications - No data to display  ED Course  I have reviewed the triage vital signs and the nursing notes.  Pertinent labs & imaging results that were available during my care of the patient were reviewed by me and considered in my medical decision making (see chart for details).    MDM Rules/Calculators/A&P                          Patient presents to the emergency department for evaluation of chest pain.   Patient is now pain-free.  She does not have any coronary artery disease history.  She did have an echo when she had her stroke in September and it was normal.  No arrhythmia noted here in the emergency department today.  Cardiac work-up has been reassuring.  Some of her pain is likely secondary to her increased anxiety.  She will be following up with cardiology after she gets the cardiac monitor, does not require hospitalization at this time.  Final Clinical Impression(s) / ED Diagnoses Final diagnoses:  Chest pain, unspecified type    Rx / DC Orders ED Discharge Orders    None       Orpah Greek, MD 11/03/19 2026

## 2019-11-03 NOTE — ED Notes (Signed)
Patient discharged to home with family.  0 s/s acute distress.  All discharge instructions reviewed.  Patient demonstrates understanding via teachback method.

## 2019-11-03 NOTE — ED Triage Notes (Signed)
Pt c/o of left sided cp starting 20 mins. Pt is awaiting a heart monitor in the mail order by her cardiologist

## 2019-11-03 NOTE — Telephone Encounter (Signed)
Pt called, have not received heart monitor. Can you let me know where the heart monitor is coming from. Would like a call from the nurse.

## 2019-11-07 ENCOUNTER — Telehealth: Payer: Self-pay | Admitting: Emergency Medicine

## 2019-11-07 NOTE — Telephone Encounter (Signed)
Pt left voicemail asking for a call to discuss how she is still waiting on her heart monitor.

## 2019-11-07 NOTE — Telephone Encounter (Signed)
Called patient back, she asked when I was sending her cardiac monitor out.  I re-educated patient on the procedure in getting a monitor, I spoke to patient twice on 10/28 for same question.  I explained we don't send the monitor out and I gave her the information for [facility] again where the referral was made.  I asked her to give it 2 weeks for processing.  I assured patient she would be contacted for arrangements.   Patient stated that's all she wanted to know and expressed appreciation.

## 2019-11-10 ENCOUNTER — Telehealth: Payer: Self-pay | Admitting: Neurology

## 2019-11-10 NOTE — Telephone Encounter (Signed)
Patient will come tomorrow am to have monitor placed on.

## 2019-11-10 NOTE — Telephone Encounter (Signed)
Received telephone call from Dr. Karn Cassis regarding an order for [place] was put in 10-27-2019.

## 2019-11-10 NOTE — Telephone Encounter (Signed)
FYI pt called back asking about her heart monitor.  The message from [place], [place] was read to her.  Pt states she will wait to hear from the right office .

## 2019-11-11 ENCOUNTER — Other Ambulatory Visit: Payer: Self-pay

## 2019-11-11 ENCOUNTER — Ambulatory Visit (INDEPENDENT_AMBULATORY_CARE_PROVIDER_SITE_OTHER): Payer: Medicare Other

## 2019-11-11 DIAGNOSIS — I6621 Occlusion and stenosis of right posterior cerebral artery: Secondary | ICD-10-CM

## 2019-11-11 DIAGNOSIS — G464 Cerebellar stroke syndrome: Secondary | ICD-10-CM | POA: Diagnosis not present

## 2019-11-16 ENCOUNTER — Telehealth: Payer: Self-pay | Admitting: Neurology

## 2019-11-16 NOTE — Telephone Encounter (Signed)
Patient called stating that she is having issues with heart monitor. States that she needs more patches. She is having no skin connection at times.

## 2019-11-16 NOTE — Telephone Encounter (Signed)
Spoke with pt and 2 preventice patches placed a front desk for pick up.

## 2019-11-30 DIAGNOSIS — I69398 Other sequelae of cerebral infarction: Secondary | ICD-10-CM | POA: Diagnosis not present

## 2019-11-30 DIAGNOSIS — R7301 Impaired fasting glucose: Secondary | ICD-10-CM | POA: Diagnosis not present

## 2019-11-30 DIAGNOSIS — M791 Myalgia, unspecified site: Secondary | ICD-10-CM | POA: Diagnosis not present

## 2019-11-30 DIAGNOSIS — I69354 Hemiplegia and hemiparesis following cerebral infarction affecting left non-dominant side: Secondary | ICD-10-CM | POA: Diagnosis not present

## 2019-11-30 DIAGNOSIS — R197 Diarrhea, unspecified: Secondary | ICD-10-CM | POA: Diagnosis not present

## 2019-11-30 DIAGNOSIS — Z712 Person consulting for explanation of examination or test findings: Secondary | ICD-10-CM | POA: Diagnosis not present

## 2019-12-07 DIAGNOSIS — Z23 Encounter for immunization: Secondary | ICD-10-CM | POA: Diagnosis not present

## 2019-12-07 DIAGNOSIS — R7301 Impaired fasting glucose: Secondary | ICD-10-CM | POA: Diagnosis not present

## 2019-12-07 DIAGNOSIS — R1030 Lower abdominal pain, unspecified: Secondary | ICD-10-CM | POA: Diagnosis not present

## 2019-12-07 DIAGNOSIS — I10 Essential (primary) hypertension: Secondary | ICD-10-CM | POA: Diagnosis not present

## 2019-12-07 DIAGNOSIS — R944 Abnormal results of kidney function studies: Secondary | ICD-10-CM | POA: Diagnosis not present

## 2020-01-31 ENCOUNTER — Encounter: Payer: Self-pay | Admitting: Neurology

## 2020-01-31 ENCOUNTER — Ambulatory Visit (INDEPENDENT_AMBULATORY_CARE_PROVIDER_SITE_OTHER): Payer: Medicare Other | Admitting: Neurology

## 2020-01-31 VITALS — BP 141/90 | HR 88 | Ht 67.0 in | Wt 124.8 lb

## 2020-01-31 DIAGNOSIS — I6621 Occlusion and stenosis of right posterior cerebral artery: Secondary | ICD-10-CM

## 2020-01-31 NOTE — Patient Instructions (Signed)
I had a long d/w patient about her recent stroke, risk for recurrent stroke/TIAs, personally independently reviewed imaging studies and stroke evaluation results and answered questions.Continue aspirin 81 mg daily  for secondary stroke prevention and maintain strict control of hypertension with blood pressure goal below 130/90, diabetes with hemoglobin A1c goal below 6.5% and lipids with LDL cholesterol goal below 70 mg/dL. I also advised the patient to eat a healthy diet with plenty of whole grains, cereals, fruits and vegetables, exercise regularly and maintain ideal body weight Followup in the future with my nurse practitioner Jessica in 6 months or call earlier if necessary.  

## 2020-01-31 NOTE — Progress Notes (Signed)
[facility] [address] [place]. [place] [address] [phone]       OFFICE FOLLOW UP VISIT NOTE  [name] Date of Birth:  10-Apr-1955 Medical Record Number:  [id number]   Referring MD: Allyn Kenner Reason for Referral: Stroke  HPI: Initial visit 10/26/2019 [name] is a pleasant 65 year old Caucasian lady seen today for initial office consultation visit for stroke.  She has past medical history of hypertension, chronic kidney disease and anxiety.  She presented to [facility] on 09/19/2019 with sudden onset of dizziness, headache and fell when she got out of bed.  She had to call EMS as she was staggering and unable to walk.  She was admitted to [facility] where MRI scan showed a right posterior cerebral artery infarct and CT angio showed right PCA occlusion in the P2 segment.  CT perfusion scan did not show significant penumbra to consider mechanical thrombectomy.  Echocardiogram showed normal ejection fraction without cardiac source of embolism.  LDL cholesterol 153 mg percent.  Hemoglobin A1c was 5.3.  Urine drug screen was negative.  Patient was transferred for rehabilitation to skilled nursing facility and is currently living at home.  She states that her peripheral vision loss is improved her gait and balance are also much better.  She has recently finished home physical and occupational therapy.  She uses a cane while walking long distances only but can walk indoors without it.  She has had no recent falls or has not bumped into any objects.  She does complain of decreased short-term memory since her stroke as well as residual numbness in the left hand and leg.  Occasionally she drags her left leg.  She is too scared to drive.  She is tolerating aspirin without bruising or bleeding and Lipitor without muscle aches and pains.  Her blood pressure is well controlled today it is 125/84. Update 01/31/2020: She returns for follow-up after last visit 3  months ago.  She states she has had no further episodes of stroke or TIA.  She has had no migraine headaches either.  She continues to have some dizziness and gait imbalance and drags her left leg but she has had no falls or injuries.  She remains on aspirin 81 mg she is tolerating well without bruising or bleeding.  Blood pressure is usually well controlled today it is borderline at 141/90 in office.  She is tolerating Lipitor well without muscle aches and pains.  She is planning to get lab work done by her primary care physician at next visit with him in April.  She has not been driving as she is scared to drive.  She did undergo 30-day cardiac heart monitor which showed sinus rhythm without significant arrhythmias.  She has no new complaints ROS:   14 system review of systems is positive for loss of vision, easy bruising, memory difficulties , gait difficulties and all other systems negative  PMH:  Past Medical History:  Diagnosis Date  . Allergy   . Anxiety   . Arthritis   . Chronic kidney disease    kidney donor, has only one kidney  . Hypertension   . 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 Migraines   . Stroke ([place]) 09/19/2019   Left side & vision  . Thyroid disease    Hx of     Social History:  Social History   Socioeconomic History  . Marital status: Single  Spouse name: Not on file  . Number of children: 5  . Years of education: Not on file  . Highest education level: Not on file  Occupational History  . Occupation: Disabled    Employer: Therapist, occupational    Comment: assembly line  Tobacco Use  . Smoking status: Former Smoker    Packs/day: 0.25    Years: 54.00    Pack years: 13.50    Types: Cigarettes, E-cigarettes    Quit date: 10/25/2014    Years since quitting: 5.2  . Smokeless tobacco: Never Used  Vaping Use  . Vaping Use: Every day  Substance and Sexual Activity  . Alcohol use: No  . Drug use: No  . Sexual activity: Not on file  Other Topics  Concern  . 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   Lives Alone   Right Handed    Drinks 1 cup caffeine daily      Social Determinants of Health   Financial Resource Strain: Not on file  Food Insecurity: Not on file  Transportation Needs: Not on file  Physical Activity: Not on file  Stress: Not on file  Social Connections: Not on file  Intimate Partner Violence: Not on file    Medications:   Current Outpatient Medications on File Prior to Visit  Medication Sig Dispense Refill  . acetaminophen (TYLENOL) 325 MG tablet Take 2 tablets (650 mg total) by mouth every 4 (four) hours as needed for mild pain (or temp > 37.5 C (99.5 F)).    Marland Kitchen amLODipine (NORVASC) 5 MG tablet Take 5 mg by mouth daily.     Marland Kitchen Apoaequorin (PREVAGEN) 10 MG CAPS Take 1 capsule by mouth every morning. 1 capsule 1  . aspirin EC 81 MG EC tablet Take 1 tablet (81 mg total) by mouth daily. Swallow whole. 30 tablet 11  . atorvastatin (LIPITOR) 80 MG tablet Take 1 tablet (80 mg total) by mouth every evening. 30 tablet 11  . fexofenadine (ALLEGRA) 180 MG tablet Take 180 mg by mouth daily as needed for allergies.     . fluticasone (FLONASE) 50 MCG/ACT nasal spray Place 2 sprays into both nostrils daily.  2  . meclizine (ANTIVERT) 25 MG tablet Take 25 mg by mouth 3 (three) times daily as needed for dizziness.    . SUMAtriptan (IMITREX) 25 MG tablet Take 1 tablet (25 mg total) by mouth every 2 (two) hours as needed for up to 2 doses for migraine or headache. May repeat in 2 hours if headache persists or recurs. 10 tablet 1   No current facility-administered medications on file prior to visit.    Allergies:   Allergies  Allergen Reactions  . Celebrex [Celecoxib] Anaphylaxis and Hives  . Sulfonamide Derivatives Anaphylaxis, Swelling and Rash  . Ace Inhibitors Cough  . Dilaudid [Hydromorphone] Nausea And Vomiting    Severe N/V  . Erythromycin Nausea And Vomiting  . Lactose  Intolerance (Gi) Diarrhea    cramping  . Sudafed [Pseudoephedrine Hcl]     Makes "high"  . Losartan Palpitations  . Mucinex [Guaifenesin Er] Palpitations    Physical Exam General: Frail middle-age Caucasian lady seated, in no evident distress Head: head normocephalic and atraumatic.   Neck: supple with no carotid or supraclavicular bruits Cardiovascular: regular rate and rhythm, no murmurs Musculoskeletal: no deformity Skin:  no rash/petichiae Vascular:  Normal pulses all extremities  Neurologic Exam Mental Status: Awake and fully alert. Oriented to  place and time. Recent and remote memory intact. Attention span, concentration and fund of knowledge appropriate. Mood and affect appropriate.  Diminished recall 2/3.  Mini-Mental status exam not done today Cranial Nerves: Fundoscopic exam reveals sharp disc margins. Pupils equal, briskly reactive to light. Extraocular movements full without nystagmus. Visual fields show partial left temporal visual field defect hearing intact. Facial sensation intact. Face, tongue, palate moves normally and symmetrically.  Motor: Normal bulk and tone. Normal strength in all tested extremity muscles. Sensory.: intact to touch , pinprick , position and vibratory sensation.  Coordination: Mild left finger-to-nose and knee to heel incoordination. Gait and Station: Arises from chair without difficulty. Stance is normal. Gait demonstrates wide base and mild ataxia with slight dragging of the left leg.   Reflexes: 1+ and symmetric. Toes downgoing.   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  Vascular risk factors of hypertension, hyperlipidemia and age.  Mild memory difficulties due to mild cognitive impairment post stroke     PLAN: I had a long d/w patient about her  recent stroke, risk for recurrent stroke/TIAs,  personally independently reviewed imaging studies and stroke evaluation results and answered questions.Continue aspirin 81 mg daily  for secondary stroke prevention and maintain strict control of hypertension with blood pressure goal below 130/90, diabetes with hemoglobin A1c goal below 6.5% and lipids with LDL cholesterol goal below 70 mg/dL. I also advised the patient to eat a healthy diet with plenty of whole grains, cereals, fruits and vegetables, exercise regularly and maintain ideal body weight. Followup in the future with my nurse practitioner Janett Billow in 6 months or call earlier if necessary.  Continue progression fall memory loss and increase participation in cognitively challenging activities like solving crossword puzzles, playing bridge and sodoku greater than 50% time during this 25-minute   visit was spent on counseling and coordination of care about her embolic stroke and answering questions.  Antony Contras, MD Note: This document was prepared with digital dictation and possible smart phrase technology. Any transcriptional errors that result from this process are unintentional.

## 2020-02-07 DIAGNOSIS — M25511 Pain in right shoulder: Secondary | ICD-10-CM | POA: Diagnosis not present

## 2020-02-07 DIAGNOSIS — R7301 Impaired fasting glucose: Secondary | ICD-10-CM | POA: Diagnosis not present

## 2020-02-07 DIAGNOSIS — R197 Diarrhea, unspecified: Secondary | ICD-10-CM | POA: Diagnosis not present

## 2020-02-07 DIAGNOSIS — E782 Mixed hyperlipidemia: Secondary | ICD-10-CM | POA: Diagnosis not present

## 2020-02-07 DIAGNOSIS — R634 Abnormal weight loss: Secondary | ICD-10-CM | POA: Diagnosis not present

## 2020-02-07 DIAGNOSIS — Z712 Person consulting for explanation of examination or test findings: Secondary | ICD-10-CM | POA: Diagnosis not present

## 2020-02-07 DIAGNOSIS — R5382 Chronic fatigue, unspecified: Secondary | ICD-10-CM | POA: Diagnosis not present

## 2020-02-07 DIAGNOSIS — R5383 Other fatigue: Secondary | ICD-10-CM | POA: Diagnosis not present

## 2020-02-08 NOTE — Progress Notes (Signed)
Kindly inform patient that 30-day heart monitor study did not reveal evidence of atrial fibrillation or any worrisome heart rhythm

## 2020-02-09 ENCOUNTER — Telehealth: Payer: Self-pay | Admitting: Emergency Medicine

## 2020-02-09 NOTE — Telephone Encounter (Signed)
Called patient and discussed Dr. Clydene Fake review and findings regarding patient's heart monitor study.  Patient denied further questions, verbalized understanding and expressed appreciation for the phone call.

## 2020-02-09 NOTE — Telephone Encounter (Signed)
-----   Message from Garvin Fila, MD sent at 02/08/2020  3:51 PM EST ----- Kindly inform patient that 30-day heart monitor study did not reveal evidence of atrial fibrillation or any worrisome heart rhythm

## 2020-02-14 DIAGNOSIS — R5382 Chronic fatigue, unspecified: Secondary | ICD-10-CM | POA: Diagnosis not present

## 2020-02-14 DIAGNOSIS — R634 Abnormal weight loss: Secondary | ICD-10-CM | POA: Diagnosis not present

## 2020-03-19 DIAGNOSIS — H2513 Age-related nuclear cataract, bilateral: Secondary | ICD-10-CM | POA: Diagnosis not present

## 2020-03-19 DIAGNOSIS — H5203 Hypermetropia, bilateral: Secondary | ICD-10-CM | POA: Diagnosis not present

## 2020-03-19 DIAGNOSIS — H53002 Unspecified amblyopia, left eye: Secondary | ICD-10-CM | POA: Diagnosis not present

## 2020-03-19 DIAGNOSIS — H5340 Unspecified visual field defects: Secondary | ICD-10-CM | POA: Diagnosis not present

## 2020-04-17 DIAGNOSIS — R110 Nausea: Secondary | ICD-10-CM | POA: Diagnosis not present

## 2020-04-17 DIAGNOSIS — R7301 Impaired fasting glucose: Secondary | ICD-10-CM | POA: Diagnosis not present

## 2020-04-17 DIAGNOSIS — E785 Hyperlipidemia, unspecified: Secondary | ICD-10-CM | POA: Diagnosis not present

## 2020-04-17 DIAGNOSIS — E782 Mixed hyperlipidemia: Secondary | ICD-10-CM | POA: Diagnosis not present

## 2020-04-17 DIAGNOSIS — M1990 Unspecified osteoarthritis, unspecified site: Secondary | ICD-10-CM | POA: Diagnosis not present

## 2020-04-19 DIAGNOSIS — I10 Essential (primary) hypertension: Secondary | ICD-10-CM | POA: Diagnosis not present

## 2020-04-19 DIAGNOSIS — J029 Acute pharyngitis, unspecified: Secondary | ICD-10-CM | POA: Diagnosis not present

## 2020-04-19 DIAGNOSIS — M1990 Unspecified osteoarthritis, unspecified site: Secondary | ICD-10-CM | POA: Diagnosis not present

## 2020-04-19 DIAGNOSIS — E782 Mixed hyperlipidemia: Secondary | ICD-10-CM | POA: Diagnosis not present

## 2020-04-19 DIAGNOSIS — R7301 Impaired fasting glucose: Secondary | ICD-10-CM | POA: Diagnosis not present

## 2020-04-19 DIAGNOSIS — R944 Abnormal results of kidney function studies: Secondary | ICD-10-CM | POA: Diagnosis not present

## 2020-04-19 DIAGNOSIS — R1030 Lower abdominal pain, unspecified: Secondary | ICD-10-CM | POA: Diagnosis not present

## 2020-04-19 DIAGNOSIS — R197 Diarrhea, unspecified: Secondary | ICD-10-CM | POA: Diagnosis not present

## 2020-04-19 DIAGNOSIS — J302 Other seasonal allergic rhinitis: Secondary | ICD-10-CM | POA: Diagnosis not present

## 2020-04-19 DIAGNOSIS — M791 Myalgia, unspecified site: Secondary | ICD-10-CM | POA: Diagnosis not present

## 2020-04-25 DIAGNOSIS — J069 Acute upper respiratory infection, unspecified: Secondary | ICD-10-CM | POA: Diagnosis not present

## 2020-04-25 DIAGNOSIS — J029 Acute pharyngitis, unspecified: Secondary | ICD-10-CM | POA: Diagnosis not present

## 2020-04-25 DIAGNOSIS — R051 Acute cough: Secondary | ICD-10-CM | POA: Diagnosis not present

## 2020-05-08 ENCOUNTER — Other Ambulatory Visit: Payer: Self-pay

## 2020-05-08 ENCOUNTER — Emergency Department (HOSPITAL_COMMUNITY): Payer: Medicare Other

## 2020-05-08 ENCOUNTER — Encounter (HOSPITAL_COMMUNITY): Payer: Self-pay | Admitting: *Deleted

## 2020-05-08 ENCOUNTER — Emergency Department (HOSPITAL_COMMUNITY)
Admission: EM | Admit: 2020-05-08 | Discharge: 2020-05-08 | Disposition: A | Discharge status: Home / Self Care | Payer: Medicare Other | Attending: Emergency Medicine | Admitting: Emergency Medicine

## 2020-05-08 DIAGNOSIS — I129 Hypertensive chronic kidney disease with stage 1 through stage 4 chronic kidney disease, or unspecified chronic kidney disease: Secondary | ICD-10-CM | POA: Insufficient documentation

## 2020-05-08 DIAGNOSIS — R0781 Pleurodynia: Secondary | ICD-10-CM | POA: Insufficient documentation

## 2020-05-08 DIAGNOSIS — J449 Chronic obstructive pulmonary disease, unspecified: Secondary | ICD-10-CM | POA: Insufficient documentation

## 2020-05-08 DIAGNOSIS — M79671 Pain in right foot: Secondary | ICD-10-CM | POA: Insufficient documentation

## 2020-05-08 DIAGNOSIS — Z79899 Other long term (current) drug therapy: Secondary | ICD-10-CM | POA: Insufficient documentation

## 2020-05-08 DIAGNOSIS — W010XXA Fall on same level from slipping, tripping and stumbling without subsequent striking against object, initial encounter: Secondary | ICD-10-CM | POA: Diagnosis not present

## 2020-05-08 DIAGNOSIS — Z7982 Long term (current) use of aspirin: Secondary | ICD-10-CM | POA: Insufficient documentation

## 2020-05-08 DIAGNOSIS — M25511 Pain in right shoulder: Secondary | ICD-10-CM | POA: Insufficient documentation

## 2020-05-08 DIAGNOSIS — W19XXXA Unspecified fall, initial encounter: Secondary | ICD-10-CM

## 2020-05-08 DIAGNOSIS — J984 Other disorders of lung: Secondary | ICD-10-CM | POA: Diagnosis not present

## 2020-05-08 DIAGNOSIS — R0602 Shortness of breath: Secondary | ICD-10-CM | POA: Diagnosis not present

## 2020-05-08 DIAGNOSIS — Z87891 Personal history of nicotine dependence: Secondary | ICD-10-CM | POA: Diagnosis not present

## 2020-05-08 DIAGNOSIS — M7731 Calcaneal spur, right foot: Secondary | ICD-10-CM | POA: Diagnosis not present

## 2020-05-08 DIAGNOSIS — M25571 Pain in right ankle and joints of right foot: Secondary | ICD-10-CM | POA: Diagnosis not present

## 2020-05-08 DIAGNOSIS — M25512 Pain in left shoulder: Secondary | ICD-10-CM | POA: Diagnosis not present

## 2020-05-08 DIAGNOSIS — N189 Chronic kidney disease, unspecified: Secondary | ICD-10-CM | POA: Insufficient documentation

## 2020-05-08 DIAGNOSIS — Y92009 Unspecified place in unspecified non-institutional (private) residence as the place of occurrence of the external cause: Secondary | ICD-10-CM | POA: Insufficient documentation

## 2020-05-08 DIAGNOSIS — S0990XA Unspecified injury of head, initial encounter: Secondary | ICD-10-CM | POA: Insufficient documentation

## 2020-05-08 DIAGNOSIS — R000 Tachycardia, unspecified: Secondary | ICD-10-CM | POA: Diagnosis not present

## 2020-05-08 DIAGNOSIS — R918 Other nonspecific abnormal finding of lung field: Secondary | ICD-10-CM | POA: Diagnosis not present

## 2020-05-08 NOTE — ED Notes (Signed)
Left voicemail with daughter regarding discharge.

## 2020-05-08 NOTE — ED Provider Notes (Signed)
[facility] EMERGENCY DEPARTMENT Provider Note   CSN: [id number] Arrival date & time: 05/08/20  1535     History Chief Complaint  Patient presents with  . Fall    [name] is a 65 y.o. female who presents to the ED today with complaint of fall that occurred earlier today. Pt reports she had just gotten up from the couch when she fell and landed on her right side. She believes she hit her right head on the wall and also scrapped her right shoulder against the wall. She believes she twisted her ankle in the process. Denies LOC. Denies any prodrome prior to the fall including dizziness, lightheadedness, chest pain, SOB, vision changes. She is unsure if her shoes caused her to fall however was wearing slip on shoes. Pt currently complains of right ankle pain. She mentions have right rib pain just after the fall however none currently. No other complaints at this time. Pt is not anticoagulated. She does report history of stroke 8 months ago with complaints of dizziness and HA which she denies today.   The history is provided by the patient and medical records.       Past Medical History:  Diagnosis Date  . Allergy   . Anxiety   . Arthritis   . Chronic kidney disease    kidney donor, has only one kidney  . Hypertension   . 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 Migraines   . Stroke ([place]) 09/19/2019   Left side & vision  . Thyroid disease    Hx of     Patient Active Problem List   Diagnosis Date Noted  . CVA (cerebral vascular accident) ([place]) 09/19/2019  . Osteoarthritis of carpometacarpal [facility]) joint of right thumb 08/17/2018  . Constipation 07/08/2018  . Abdominal pain, left upper quadrant 07/08/2018  . Pain in right wrist 05/13/2018  . Chronic kidney disease 11/06/2015  . 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 Cough variant not due to asthma 05/16/2014  . Essential hypertension, benign 05/16/2014  . Rotator cuff impingement syndrome of right  shoulder 04/25/2014  . 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  . Thyroiditis, subacute 12/22/2013  . Right thyroid nodule 12/22/2013  . Irritability 12/08/2013  . Arthritis 12/08/2013  . Joint pain 10/03/2013  . 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 History of nephrectomy 08/10/2013  . Paresthesia of both hands 08/09/2013  . C O P D 03/05/2007    Past Surgical History:  Procedure Laterality Date  . ABDOMINAL HYSTERECTOMY  1991   partial  . BLADDER SURGERY     bladder tac, then bladder tack reversal due to adhesions.  . DORSAL COMPARTMENT RELEASE Right 02/07/2013   Procedure: RIGHT WRIST DEQUERVAIN RELEASE;  Surgeon: Jolyn Nap, MD;  Location: [place];  Service: Orthopedics;  Laterality: Right;  . KIDNEY DONATION  2008   gave her lt kidney to daughter  . NECK MASS EXCISION     rt-mass  . NECK SURGERY  1990   rt ? parotidectomy  . SHOULDER ARTHROSCOPY WITH OPEN ROTATOR CUFF REPAIR AND DISTAL CLAVICLE ACROMINECTOMY Right 04/25/2014   Procedure: SHOULDER ARTHROSCOPY WITH MINI-OPEN ROTATOR CUFF REPAIR AND DISTAL CLAVICLE RESECTION;  Surgeon: Garald Balding, MD;  Location: [place];  Service: Orthopedics;  Laterality: Right;  . SHOULDER CLOSED REDUCTION Right 08/31/2014   Procedure: CLOSED MANIPULATION SHOULDER;  Surgeon: Garald Balding, MD;  [facility];  Service: Orthopedics;  Laterality: Right;  . SUBACROMIAL DECOMPRESSION Right 04/25/2014   Procedure: SUBACROMIAL DECOMPRESSION;  Surgeon: Garald Balding, MD;  Location: [place];  Service: Orthopedics;  Laterality: Right;  . THYROID SURGERY     bx     OB History   No obstetric history on file.     Family History  Problem Relation Age of Onset  . Arthritis Mother        rheumatoid  . Hypertension Mother   . Heart disease Mother        Irregular heart beat   . Arrhythmia Mother   . Rheum arthritis Mother    . Pulmonary fibrosis Mother   . Cancer Father        Lung  . COPD Father   . Hypertension Sister   . Aneurysm Brother 11       brain  . Heart disease Maternal Aunt   . Pulmonary fibrosis Maternal Aunt   . Arthritis Maternal Uncle        rheumatoid  . Pulmonary fibrosis Maternal Uncle   . Heart disease Maternal Grandmother   . Diabetes Maternal Uncle   . Hypertension Sister   . Colon cancer Neg Hx   . Colon polyps Neg Hx     Social History   Tobacco Use  . Smoking status: Former Smoker    Packs/day: 0.25    Years: 54.00    Pack years: 13.50    Types: Cigarettes, E-cigarettes    Quit date: 10/25/2014    Years since quitting: 5.5  . Smokeless tobacco: Never Used  Vaping Use  . Vaping Use: Every day  Substance Use Topics  . Alcohol use: No  . Drug use: No    Home Medications Prior to Admission medications   Medication Sig Start Date End Date Taking? Authorizing Provider  acetaminophen (TYLENOL) 325 MG tablet Take 2 tablets (650 mg total) by mouth every 4 (four) hours as needed for mild pain (or temp > 37.5 C (99.5 F)). 09/21/19  Yes Johnson, Clanford L, MD  amLODipine (NORVASC) 5 MG tablet Take 5 mg by mouth daily.  04/15/17  Yes [provider]  aspirin EC 81 MG EC tablet Take 1 tablet (81 mg total) by mouth daily. Swallow whole. 09/22/19  Yes Johnson, Clanford L, MD  atorvastatin (LIPITOR) 80 MG tablet Take 1 tablet (80 mg total) by mouth every evening. Patient taking differently: Take 40 mg by mouth daily. 09/21/19 09/20/20 Yes Johnson, Clanford L, MD  fexofenadine (ALLEGRA) 180 MG tablet Take 180 mg by mouth daily as needed for allergies.    Yes [provider]  SUMAtriptan (IMITREX) 25 MG tablet Take 1 tablet (25 mg total) by mouth every 2 (two) hours as needed for up to 2 doses for migraine or headache. May repeat in 2 hours if headache persists or recurs. 09/23/19  Yes Johnson, Clanford L, MD  Apoaequorin (PREVAGEN) 10 MG CAPS Take 1 capsule by mouth  every morning. Patient not taking: No sig reported 10/26/19   Garvin Fila, MD  fluticasone [facility]) 50 MCG/ACT nasal spray Place 2 sprays into both nostrils daily. Patient not taking: No sig reported 09/22/19   Murlean Iba, MD  meclizine (ANTIVERT) 25 MG tablet Take 25 mg by mouth 3 (three) times daily as needed for dizziness. Patient not taking: Reported on 05/08/2020 09/16/19   [provider]    Allergies    Celebrex [celecoxib], Sulfonamide derivatives, Ace inhibitors,  Dilaudid [hydromorphone], Erythromycin, Lactose intolerance (gi), Sudafed [pseudoephedrine hcl], Losartan, and Mucinex [guaifenesin er]  Review of Systems   Review of Systems  Constitutional: Negative for chills and fever.  Musculoskeletal: Positive for arthralgias.  Neurological: Negative for syncope and headaches.  All other systems reviewed and are negative.   Physical Exam Updated Vital Signs BP 127/88   Pulse (!) 104   Temp 98 F (36.7 C) (Oral)   Resp 18   SpO2 97%   Physical Exam Vitals and nursing note reviewed.  Constitutional:      Appearance: She is not ill-appearing.  HENT:     Head: Normocephalic and atraumatic.     Comments: No raccoon's sign or battle's sign. Negative hemotympanum bilaterally.     Right Ear: Tympanic membrane normal.     Left Ear: Tympanic membrane normal.  Eyes:     Extraocular Movements: Extraocular movements intact.     Conjunctiva/sclera: Conjunctivae normal.     Pupils: Pupils are equal, round, and reactive to light.  Cardiovascular:     Rate and Rhythm: Normal rate and regular rhythm.     Pulses: Normal pulses.  Pulmonary:     Effort: Pulmonary effort is normal.     Breath sounds: Normal breath sounds. No wheezing, rhonchi or rales.  Chest:     Chest wall: No tenderness.  Abdominal:     Palpations: Abdomen is soft.     Tenderness: There is no guarding or rebound.  Musculoskeletal:     Cervical back: Normal range of motion and neck supple.  No rigidity.     Comments: Mild swelling noted to the R ankle. + TTP diffusely to the ankle joint as well as 5th metacarpal. ROM limited to ankle s/2 pain. Strength and sensation intact. 2+ DP pulse. No external rotation of the RLE or shortening; no hip TTP.   Small skin abrasion noted to R shoulder with mild TTP. ROM intact. Strength and sensation intact. 2+ radial pulse.   Skin:    General: Skin is warm and dry.  Neurological:     Mental Status: She is alert.     Comments: Alert and oriented to self, place, time and event.   Speech is fluent, clear without dysarthria or dysphasia.   Strength 5/5 in upper/lower extremities  Sensation intact in upper/lower extremities   Normal gait.  Negative Romberg. No pronator drift.  Normal finger-to-nose and feet tapping.  CN I not tested  CN II grossly intact visual fields bilaterally. Did not visualize posterior eye.   CN III, IV, VI PERRLA and EOMs intact bilaterally  CN V Intact sensation to sharp and light touch to the face  CN VII facial movements symmetric  CN VIII not tested  CN IX, X no uvula deviation, symmetric rise of soft palate  CN XI 5/5 SCM and trapezius strength bilaterally  CN XII Midline tongue protrusion, symmetric L/R movements      ED Results / Procedures / Treatments   Labs (all labs ordered are listed, but only abnormal results are displayed) Labs Reviewed - No data to display  EKG EKG Interpretation  Date/Time:  Tuesday May 08 2020 16:40:21 EDT Ventricular Rate:  104 PR Interval:  132 QRS Duration: 92 QT Interval:  340 QTC Calculation: 447 R Axis:   -77 Text Interpretation: Sinus tachycardia Incomplete right bundle branch block Left anterior fascicular block Nonspecific ST abnormality Confirmed by Lajean Saver [phone]) on 05/08/2020 5:07:00 PM   Radiology DG Ribs Unilateral W/Chest Right  Result  Date: 05/08/2020 CLINICAL DATA:  Pain following fall EXAM: RIGHT RIBS AND CHEST - 3+ VIEW COMPARISON:  Chest  radiograph November 03, 2019. FINDINGS: Frontal chest as well as oblique and cone-down rib images were obtained. Lungs are hyperexpanded but clear. Heart size and pulmonary vascularity within normal limits. No adenopathy. No appreciable pneumothorax or pleural effusion. There is evidence of an old healed fracture of the posterior left eleventh rib. No acute fracture evident. IMPRESSION: No acute fracture evident. No pneumothorax or pleural effusion. Old healed fracture posterior left eleventh rib. Lungs hyperexpanded. Electronically Signed   By: Lowella Grip III M.D.   On: 05/08/2020 18:50   DG Shoulder Right  Result Date: 05/08/2020 CLINICAL DATA:  Pain following fall EXAM: RIGHT SHOULDER - 2+ VIEW COMPARISON:  None. FINDINGS: Oblique, Y scapular, axillary images obtained. No acute fracture or dislocation. Calcifications in the superior acromioclavicular joint may represent residua of old trauma. There is mild generalized joint space narrowing. No erosive change. Visualized right lung clear. IMPRESSION: No acute fracture or dislocation. Question old trauma in the superior acromioclavicular region. There is mild generalized joint space narrowing. Electronically Signed   By: Lowella Grip III M.D.   On: 05/08/2020 18:47   DG Ankle Complete Right  Result Date: 05/08/2020 CLINICAL DATA:  Pain following fall EXAM: RIGHT ANKLE - COMPLETE 3+ VIEW COMPARISON:  None. FINDINGS: Frontal, oblique, and lateral views were obtained. There is no appreciable fracture or joint effusion. Joint spaces appear unremarkable. There is a small posterior calcaneal spur. Ankle mortise appears intact. IMPRESSION: No evident fracture or appreciable joint space narrowing. Ankle mortise appears intact. Small posterior calcaneal spur. Electronically Signed   By: Lowella Grip III M.D.   On: 05/08/2020 18:51   CT Head Wo Contrast  Result Date: 05/08/2020 CLINICAL DATA:  Fall, head injury, EXAM: CT HEAD WITHOUT CONTRAST  TECHNIQUE: Contiguous axial images were obtained from the base of the skull through the vertex without intravenous contrast. COMPARISON:  MRI 09/19/2019 FINDINGS: Brain: There is encephalomalacia involving the right calcarine and para hippocampal gyri in keeping with remote right PCA territory infarct. Encephalomalacia is also noted within the splenium of the corpus callosum and right thalamus. No acute intracranial hemorrhage or infarct. No abnormal mass effect or midline shift. No abnormal intra or extra-axial mass lesion. 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 Ventricular size is normal. Cerebellum is unremarkable. Vascular: No asymmetric hyperdense vasculature at the skull base Skull: Intact Sinuses/Orbits: The paranasal sinuses are clear. The orbits are unremarkable. Other: Mastoid air cells and middle ear cavities are clear. IMPRESSION: Remote right PCA territory infarct. No evidence of acute intracranial injury.  No calvarial fracture. Electronically Signed   By: Fidela Salisbury MD   On: 05/08/2020 18:23   DG Foot Complete Right  Result Date: 05/08/2020 CLINICAL DATA:  Pain following fall EXAM: RIGHT FOOT COMPLETE - 3+ VIEW COMPARISON:  None. FINDINGS: Frontal, oblique, and lateral views obtained. No evident fracture or dislocation. There is mild narrowing of the first MTP joint. Other joint spaces appear normal. No erosive change. There is a small posterior calcaneal spur. IMPRESSION: Slight narrowing first MTP joint. Small posterior calcaneal spur. No fracture or dislocation evident. Electronically Signed   By: Lowella Grip III M.D.   On: 05/08/2020 18:52    Procedures Procedures   Medications Ordered in ED Medications - No data to display  ED Course  I have reviewed the triage vital signs and the nursing notes.  Pertinent labs & imaging  results that were available during my care of the patient were reviewed by me and considered in my medical decision making (see chart for  details).    MDM Rules/Calculators/A&P                          65 year old female who presents to the ED today status post fall that occurred just prior to arrival.  Was getting up off her couch when she feels like maybe her shoes caused her to fall hitting the right side of her body against a wall.  Feels like she twisted her ankle in the process.  Came by personal vehicle.  On arrival to the ED patient was noted to be tachycardic at 104 however this is resolved since she is in the room.  Suspect it was likely related to anxiety.  She denies any prodrome prior to the fall.  On my exam today she has no focal neurodeficits.  She does a history of stroke 8 months ago however states that she did not present similarly and does not feel like she had a stroke today.  She did hit her head on the wall however denies loss of consciousness and is not anticoagulated.  My exam she does have some tenderness palpation and swelling to the right ankle and right lateral foot.  She also has an abrasion to her right shoulder.  She states that she was having some right-sided rib pain after the fall however this has resolved.  We will plan for x-rays at this time for further evaluation.  Also plan for CT head given the fact that she hit her head and her age.  If no acute findings will plan to discharge home.   CT Head negative Xrays reassuring at this time without acute findings. Will discharge home at this time. Pt instructed to rest, ice, and elevate ankle to help reduce pain/swelling and to take Tylenol as needed for pain. Pt to follow up with PCP. She is in agreement with plan and stable for discharge home.   This note was prepared using Dragon voice recognition software and may include unintentional dictation errors due to the inherent limitations of voice recognition software.  Final Clinical Impression(s) / ED Diagnoses Final diagnoses:  Fall, initial encounter    Rx / DC Orders ED Discharge Orders    None        Discharge Instructions     Your CT scan and xrays did not show any breaks or other abnormalities.  While at home please rest, ice, and elevate your ankle to help reduce pain/swelling. You can take Tylenol as needed for pain.   Follow up with your PCP regarding your ED visit today  Return to the ED for any worsening symptoms        Eustaquio Maize, PA-C 05/08/20 1932    Lajean Saver, MD 05/09/20 [phone]

## 2020-05-08 NOTE — Discharge Instructions (Signed)
Your CT scan and xrays did not show any breaks or other abnormalities.  While at home please rest, ice, and elevate your ankle to help reduce pain/swelling. You can take Tylenol as needed for pain.   Follow up with your PCP regarding your ED visit today  Return to the ED for any worsening symptoms

## 2020-05-08 NOTE — ED Triage Notes (Signed)
Fell at home, pain in right foot, left side of chest,right shoulder

## 2020-05-29 DIAGNOSIS — H5340 Unspecified visual field defects: Secondary | ICD-10-CM | POA: Diagnosis not present

## 2020-05-29 DIAGNOSIS — H2513 Age-related nuclear cataract, bilateral: Secondary | ICD-10-CM | POA: Diagnosis not present

## 2020-06-20 DIAGNOSIS — H43813 Vitreous degeneration, bilateral: Secondary | ICD-10-CM | POA: Diagnosis not present

## 2020-06-27 ENCOUNTER — Other Ambulatory Visit: Payer: Self-pay

## 2020-06-27 ENCOUNTER — Ambulatory Visit (INDEPENDENT_AMBULATORY_CARE_PROVIDER_SITE_OTHER): Payer: Medicare Other | Admitting: Orthopaedic Surgery

## 2020-06-27 ENCOUNTER — Encounter: Payer: Self-pay | Admitting: Orthopaedic Surgery

## 2020-06-27 ENCOUNTER — Ambulatory Visit: Payer: Self-pay

## 2020-06-27 VITALS — Ht 67.0 in | Wt 124.0 lb

## 2020-06-27 DIAGNOSIS — G8929 Other chronic pain: Secondary | ICD-10-CM

## 2020-06-27 DIAGNOSIS — M7502 Adhesive capsulitis of left shoulder: Secondary | ICD-10-CM

## 2020-06-27 DIAGNOSIS — M25512 Pain in left shoulder: Secondary | ICD-10-CM

## 2020-06-27 NOTE — Progress Notes (Signed)
Office Visit Note   Patient: [name]           Date of Birth: [birth year]           MRN: [id number] Visit Date: 06/27/2020              Requested by: [name], MD [place],  [place] [address] PCP: [name], MD   Assessment & Plan: Visit Diagnoses:  1. Chronic left shoulder pain   2. Adhesive capsulitis of left shoulder     Plan: [name] had a left-sided stroke approximately 10 months ago.  She feels like she is actually improving and can drive and walk but still has some limited use of more of the left arm than the left leg.  She is developed an adhesive capsulitis and is having some difficulty with overhead activity.  There are significant residuals from the stroke and her arm does shake and she has difficult time preparing food and other activities of daily living.  Her family helps her with that.  I think physical therapy would be worthwhile we will set that up for her here as it is more convenient and then like to check her back at some point in the future.  She does follow-up with the neurologist  Follow-Up Instructions: Return if symptoms worsen or fail to improve.   Orders:  Orders Placed This Encounter  Procedures   XR Shoulder Left   Ambulatory referral to Physical Therapy   No orders of the defined types were placed in this encounter.     Procedures: No procedures performed   Clinical Data: No additional findings.   Subjective: Chief Complaint  Patient presents with   Left Shoulder - Pain  Patient presents today for left shoulder and proximal arm pain. She said that she suffered a massive stroke 21months ago and it affected her last side. She has had left shoulder pain since. She cannot lift her left arm without assistance with the right arm. She shakes and drops things when she tries to use it.  HPI  Review of Systems   Objective: Vital Signs: Ht 5\' 7"  (1.702 m)   Wt 124 lb (56.2 kg)   BMI 19.42 kg/m   Physical  Exam Constitutional:      Appearance: She is well-developed.  Pulmonary:     Effort: Pulmonary effort is normal.  Skin:    General: Skin is warm and dry.  Neurological:     Mental Status: She is alert and oriented to person, place, and time.  Psychiatric:        Behavior: Behavior normal.    Ortho Exam awake alert and oriented x3.  Comfortable sitting.  Normal speech no evidence of expressive aphasia.  Her arm shakes when she moves it and has poor fine motor control.  Has some tingling into her fingers.  Can make good grip and release.  Has reasonable strength.  Only had about 120 degrees of arm elevation but she could touch that her arm to the middle of her back.  She was quite tight in flexion at 120 degrees.  The there was some loss of external rotation.  Specialty Comments:  No specialty comments available.  Imaging: XR Shoulder Left  Result Date: 06/27/2020 Films of the left shoulder obtained in several projections.  There are some degenerative changes at the [facility] joint with a slightly bulbous appearance of the distal clavicle.  No ectopic calcification.  No obvious  degenerative changes in the glenohumeral joint.  Humeral head is centered about the glenoid and normal space between the humeral head and the acromion.    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   Cough variant not due to asthma 05/16/2014   Essential hypertension, benign 05/16/2014   Rotator cuff impingement syndrome of right shoulder 04/25/2014   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   Thyroiditis, subacute 12/22/2013   Right  thyroid nodule 12/22/2013   Irritability 12/08/2013   Arthritis 12/08/2013   Joint pain 10/03/2013   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   History of nephrectomy 08/10/2013   Paresthesia of both hands 08/09/2013   C O P D 03/05/2007   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   Migraines    Stroke ([place]) 09/19/2019   Left side & vision   Thyroid disease    Hx of     Family History  Problem Relation Age of Onset   Arthritis Mother        rheumatoid   Hypertension Mother    Heart disease Mother        Irregular heart beat    Arrhythmia Mother    Rheum arthritis Mother    Pulmonary fibrosis Mother    Cancer Father        Lung   COPD Father    Hypertension Sister    Aneurysm Brother 11       brain   Heart disease Maternal Aunt    Pulmonary fibrosis Maternal Aunt    Arthritis Maternal Uncle        rheumatoid   Pulmonary fibrosis Maternal Uncle    Heart disease Maternal Grandmother    Diabetes Maternal Uncle    Hypertension Sister    Colon cancer Neg Hx    Colon polyps Neg Hx     Past Surgical History:  Procedure Laterality Date   ABDOMINAL HYSTERECTOMY  1991   partial   BLADDER SURGERY     bladder tac, then bladder tack reversal due to adhesions.   DORSAL COMPARTMENT RELEASE Right 02/07/2013   Procedure: RIGHT WRIST DEQUERVAIN RELEASE;  Surgeon: Jolyn Nap, MD;  Location: [place];  Service: Orthopedics;  Laterality: Right;   KIDNEY DONATION  2008   gave her lt kidney to daughter   NECK MASS EXCISION     rt-mass   [place]   rt ? parotidectomy   SHOULDER ARTHROSCOPY WITH OPEN ROTATOR CUFF REPAIR AND DISTAL CLAVICLE ACROMINECTOMY Right 04/25/2014   Procedure: SHOULDER ARTHROSCOPY WITH MINI-OPEN ROTATOR CUFF REPAIR AND DISTAL CLAVICLE  RESECTION;  Surgeon: Garald Balding, MD;  Location: [place];  Service: Orthopedics;  Laterality: Right;   SHOULDER CLOSED REDUCTION Right 08/31/2014   Procedure: CLOSED MANIPULATION SHOULDER;  Surgeon: Garald Balding, MD;  Location: [place];  Service: Orthopedics;  Laterality: Right;   SUBACROMIAL DECOMPRESSION Right 04/25/2014   Procedure: SUBACROMIAL DECOMPRESSION;  Surgeon: Garald Balding, MD;  Location: [place];  Service: Orthopedics;  Laterality: Right;   THYROID SURGERY     bx   Social History   Occupational History   Occupation: Disabled    Employer: Therapist, occupational    Comment: assembly line  Tobacco Use   Smoking status: Former    Packs/day: 0.25    Years: 54.00    Pack years: 13.50    Types: Cigarettes, E-cigarettes    Quit date: 10/25/2014    Years since quitting: 5.6   Smokeless tobacco: Never  Vaping Use   Vaping Use: Every day  Substance and Sexual Activity   Alcohol use: No   Drug use: No   Sexual activity: Not on file

## 2020-06-28 DIAGNOSIS — R600 Localized edema: Secondary | ICD-10-CM | POA: Diagnosis not present

## 2020-06-28 DIAGNOSIS — G8190 Hemiplegia, unspecified affecting unspecified side: Secondary | ICD-10-CM | POA: Diagnosis not present

## 2020-07-05 DIAGNOSIS — R600 Localized edema: Secondary | ICD-10-CM | POA: Diagnosis not present

## 2020-07-10 ENCOUNTER — Other Ambulatory Visit: Payer: Self-pay

## 2020-07-10 ENCOUNTER — Encounter: Payer: Self-pay | Admitting: Physical Therapy

## 2020-07-10 ENCOUNTER — Ambulatory Visit (INDEPENDENT_AMBULATORY_CARE_PROVIDER_SITE_OTHER): Payer: Medicare Other | Admitting: Physical Therapy

## 2020-07-10 DIAGNOSIS — G8929 Other chronic pain: Secondary | ICD-10-CM

## 2020-07-10 DIAGNOSIS — M25612 Stiffness of left shoulder, not elsewhere classified: Secondary | ICD-10-CM

## 2020-07-10 DIAGNOSIS — R293 Abnormal posture: Secondary | ICD-10-CM | POA: Diagnosis not present

## 2020-07-10 DIAGNOSIS — M25512 Pain in left shoulder: Secondary | ICD-10-CM

## 2020-07-10 DIAGNOSIS — M6281 Muscle weakness (generalized): Secondary | ICD-10-CM

## 2020-07-10 NOTE — Patient Instructions (Signed)
Access Code: [email or web address] URL: [email or web address] Date: 07/10/2020 Prepared by: Faustino Congress  Exercises Supine Chest Stretch with Elbows Bent - 2-3 x daily - 7 x weekly - 1 sets - 3 reps - 30 sec hold Standing Shoulder Flexion to 90 Degrees with Dumbbells - 2-3 x daily - 7 x weekly - 3 sets - 10 reps Shoulder Abduction with Dumbbells - Thumbs Up - 2-3 x daily - 7 x weekly - 3 sets - 10 reps

## 2020-07-10 NOTE — Therapy (Addendum)
[facility] [address] [place], [place], [address] Phone: [phone]   Fax:  [phone]  Physical Therapy Evaluation/Discharge Summary  Patient Details  Name: [name] MRN: [id number] Date of Birth: 1955/08/16 Referring Provider (PT): Garald Balding, MD   Encounter Date: 07/10/2020   PT End of Session - 07/10/20 1142     Visit Number 1    Number of Visits 12    Date for PT Re-Evaluation 08/21/20    Authorization Type [employer] Medicare    Progress Note Due on Visit 10    PT Start Time 1101    PT Stop Time 1135    PT Time Calculation (min) 34 min    Activity Tolerance Patient tolerated treatment well    Behavior During Therapy [facility] for tasks assessed/performed             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   DORSAL COMPARTMENT RELEASE Right 02/07/2013   Procedure: RIGHT WRIST DEQUERVAIN RELEASE;  Surgeon: Jolyn Nap, MD;  Location: [place];  Service: Orthopedics;  Laterality: Right;   KIDNEY DONATION  2008   gave her lt kidney to daughter   NECK MASS EXCISION     rt-mass   [place]   rt ? parotidectomy   SHOULDER ARTHROSCOPY WITH OPEN ROTATOR CUFF REPAIR AND DISTAL CLAVICLE ACROMINECTOMY Right 04/25/2014   Procedure: SHOULDER ARTHROSCOPY WITH MINI-OPEN ROTATOR CUFF REPAIR AND DISTAL CLAVICLE RESECTION;  Surgeon: Garald Balding, MD;  Location: [place];  Service: Orthopedics;  Laterality: Right;   SHOULDER CLOSED REDUCTION Right  08/31/2014   Procedure: CLOSED MANIPULATION SHOULDER;  Surgeon: Garald Balding, MD;  Location: [place];  Service: Orthopedics;  Laterality: Right;   SUBACROMIAL DECOMPRESSION Right 04/25/2014   Procedure: SUBACROMIAL DECOMPRESSION;  Surgeon: Garald Balding, MD;  Location: [place];  Service: Orthopedics;  Laterality: Right;   THYROID SURGERY     bx    There were no vitals filed for this visit.    Subjective Assessment - 07/10/20 1106     Subjective Pt is a 65 y/o female who presents to OPPT for Lt shoulder pain since Sept 2021.  She had a CVA in Sept 20201 and reports minimal use of LUE since then.  At this time she is trying to use her LUE but "it shakes too much."    Limitations Lifting;House hold activities    Patient Stated Goals improve use of LUE    Currently in Pain? Yes    Pain Score 0-No pain   up to 8/10; resolves quickly   Pain Location Shoulder    Pain Orientation Left    Pain Descriptors / Indicators Aching;Sharp    Pain Type Chronic pain    Pain Onset More than a month ago    Pain Frequency Intermittent  Aggravating Factors  anytime she tries to use her Lt arm    Pain Relieving Factors avoiding use                [facility] PT Assessment - 07/10/20 1109       Assessment   Medical Diagnosis M25.512,G89.29 (ICD-10-CM) - Chronic left shoulder pain    Referring Provider (PT) Garald Balding, MD    Onset Date/Surgical Date --   Sept 2021   Hand Dominance Right    Next MD Visit PRN    Prior Therapy SNF initially - then HHPT      Precautions   Precautions Fall      Restrictions   Weight Bearing Restrictions No      Balance Screen   Has the patient fallen in the past 6 months Yes    How many times? 1    Has the patient had a decrease in activity level because of a fear of falling?  No    Is the patient reluctant to leave their home because of a fear of falling?  No      Home Ecologist  residence    Living Arrangements Children;Other relatives    Available Help at Discharge Family   son, daughter-in-law, 2 grandchildren (45, 29)   Type of [place] Access Level entry    Home Layout Two level;Bed/bath upstairs    Alternate Level Stairs-Number of Steps 16    Alternate [place] - single point;Walker - 2 wheels      Prior Function   Level of Independence Independent    Vocation On disability   since 2016   Leisure sit outside; scared to go to pool due to fear of falling; daily walking      Cognition   Overall Cognitive Status Within Functional Limits for tasks assessed      Observation/Other Assessments   Focus on Therapeutic Outcomes (FOTO)  38 (predicted 62)      Posture/Postural Control   Posture/Postural Control Postural limitations    Postural Limitations Rounded Shoulders;Forward head      Tone   Assessment Location Left Upper Extremity      ROM / Strength   AROM / PROM / Strength AROM;PROM;Strength      AROM   AROM Assessment Site Shoulder    Right/Left Shoulder Left    Left Shoulder Flexion 132 Degrees    Left Shoulder ABduction 150 Degrees    Left Shoulder Internal Rotation --   FIR WNL   Left Shoulder External Rotation --   FER with tightness present     PROM   PROM Assessment Site Shoulder    Right/Left Shoulder Left    Left Shoulder Flexion 150 Degrees    Left Shoulder ABduction 171 Degrees      Strength   Overall Strength Comments Lt shoulder 2+/5 except internal rotation 5/5      Palpation   Palpation comment pt c/o tightness along Lt upper arm, no significant tightness noted with palpation      LUE Tone   LUE Tone Modified Ashworth      LUE Tone   Modified Ashworth Scale for Grading Hypertonia LUE Slight increase in muscle tone, manifested by a catch, followed by minimal resistance throughout the remainder (less than half) of the ROM  Objective  measurements completed on examination: See above findings.       [facility] Adult PT Treatment/Exercise - 07/10/20 1109       Exercises   Exercises Other Exercises    Other Exercises  see pt instructions - pt performed 3-5 reps of each exercise with mod cues for technique                    PT Education - 07/10/20 1142     Education Details HEP    Person(s) Educated Patient    Methods Explanation;Demonstration;Handout    Comprehension Verbalized understanding;Returned demonstration;Need further instruction              PT Short Term Goals - 07/10/20 1431       PT SHORT TERM GOAL #1   Title independent with initial HEP    Time 3    Period Weeks    Status New    Target Date 07/31/20      PT SHORT TERM GOAL #2   Title -      PT SHORT TERM GOAL #3   Title -               PT Long Term Goals - 07/10/20 1432       PT LONG TERM GOAL #1   Title independent with final HEP    Time 6    Period Weeks    Status New    Target Date 08/21/20      PT LONG TERM GOAL #2   Title improve FOTO score to 62 for improved function    Time 6    Period Weeks    Status New    Target Date 08/21/20      PT LONG TERM GOAL #3   Title report pain < 4/10 with activity for improved function and pain    Time 6    Period Weeks    Status New    Target Date 08/21/20      PT LONG TERM GOAL #4   Title demonstrate at least 3/5 Lt shoulder strength for improved function    Time 6    Period Weeks    Status New    Target Date 08/21/20      PT LONG TERM GOAL #5   Title demonstrate Lt shoulder AROM flexion and abduction to WNL for improved function    Time 6    Period Weeks    Status New    Target Date 08/21/20                    Plan - 07/10/20 1142     Clinical Impression Statement Pt is a 65 y/o female who presents to [place] for Lt UE pain and weakness following CVA in Sept 2021.  She demonstrates decreased ROM and strength affecting functional mobility.   Will benefit from PT to address deficits listed.    Personal Factors and Comorbidities Comorbidity 3+    Comorbidities anxiety, arthritis, HTN, CKD, migraines, CVA Sept 2021    Examination-Activity Limitations Lift;Reach Overhead;Dressing    Examination-Participation Restrictions Driving;Laundry;Meal Prep    Stability/Clinical Decision Making Evolving/Moderate complexity    Clinical Decision Making Moderate    Rehab Potential Good    PT Frequency 2x / week   1-2x/wk   PT Duration 6 weeks    PT Treatment/Interventions ADLs/Self Care Home Management;Cryotherapy;Electrical Stimulation;Moist Heat;Therapeutic exercise;Therapeutic activities;Functional mobility training;Neuromuscular re-education;Patient/family education;Manual techniques;Taping;Dry needling;Passive range of motion  PT Next Visit Plan review HEP, work on Lt shoulder strengthening, manual/modalities PRN for pain    PT Home Exercise Plan Access Code: [email or web address]    Consulted and Agree with Plan of Care Patient             Patient will benefit from skilled therapeutic intervention in order to improve the following deficits and impairments:  Impaired tone, Decreased strength, Impaired UE functional use, Pain, Decreased range of motion, Postural dysfunction  Visit Diagnosis: Chronic left shoulder pain - Plan: PT plan of care cert/re-cert  Stiffness of left shoulder, not elsewhere classified - Plan: PT plan of care cert/re-cert  Abnormal posture - Plan: PT plan of care cert/re-cert  Muscle weakness (generalized) - Plan: PT plan of care cert/re-cert     Problem List Patient Active Problem List   Diagnosis Date Noted   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/27/2020   CVA (cerebral vascular accident) ([place]) 09/19/2019   Osteoarthritis of carpometacarpal ([place]) joint of right thumb 08/17/2018   Constipation 07/08/2018   Abdominal pain, left upper quadrant 07/08/2018   Pain in right wrist 05/13/2018   Chronic kidney disease  11/06/2015   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   Cough variant not due to asthma 05/16/2014   Essential hypertension, benign 05/16/2014   Rotator cuff impingement syndrome of right shoulder 04/25/2014   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   Thyroiditis, subacute 12/22/2013   Right thyroid nodule 12/22/2013   Irritability 12/08/2013   Arthritis 12/08/2013   Joint pain 10/03/2013   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   History of nephrectomy 08/10/2013   Paresthesia of both hands 08/09/2013   C O P D 03/05/2007     Laureen Abrahams, PT, DPT 07/10/20 2:36 PM    [facility] [address] [place], [place], [address] Phone: [phone]   Fax:  [phone]  Name: [name] MRN: [id number] Date of Birth: Apr 20, 1955     PHYSICAL THERAPY DISCHARGE SUMMARY  Visits from Start of Care: 1  Current functional level related to goals / functional outcomes: See above   Remaining deficits: unknown   Education / Equipment: HEP   Patient agrees to discharge. Patient goals were not met. Patient is being discharged due to not returning since the last visit.   Laureen Abrahams, PT, DPT 09/13/20 4:04 PM  [facility] [address] [place], [place], [address] Phone: [phone]   Fax:  [phone]

## 2020-07-16 ENCOUNTER — Encounter: Payer: Medicare Other | Admitting: Rehabilitative and Restorative Service Providers"

## 2020-07-25 ENCOUNTER — Encounter: Payer: Medicare Other | Admitting: Physical Therapy

## 2020-07-26 ENCOUNTER — Other Ambulatory Visit: Payer: Self-pay

## 2020-07-26 ENCOUNTER — Ambulatory Visit (INDEPENDENT_AMBULATORY_CARE_PROVIDER_SITE_OTHER): Payer: Medicare Other

## 2020-07-26 ENCOUNTER — Encounter: Payer: Self-pay | Admitting: Podiatry

## 2020-07-26 ENCOUNTER — Ambulatory Visit (INDEPENDENT_AMBULATORY_CARE_PROVIDER_SITE_OTHER): Payer: Medicare Other | Admitting: Podiatry

## 2020-07-26 DIAGNOSIS — T148XXA Other injury of unspecified body region, initial encounter: Secondary | ICD-10-CM

## 2020-07-26 NOTE — Progress Notes (Signed)
Subjective:  Patient ID: [name], female    DOB: February 20, 1955,  MRN: [id number]  Chief Complaint  Patient presents with   Foot Pain    PT stated that she had a stroke and now she has no control over her left foot she gets cramps in the toes when she bends them     65 y.o. female presents with the above complaint.  Patient presents with complaint of left dorsal foot pain that has been going on for quite some time.  Patient states is painful to touch.  She would like to discuss what was was going on.  She does not recall any trauma to it.  The foot gets cracked.  She has a history of stroke to the right side of the brain which made her left side affected.  She states she is gastrin to the left side but she has a hard time walking.  She has not seen anyone else prior to seeing me.  She would like to discuss treatment options.  She states it does swell up.   Review of Systems: Negative except as noted in the HPI. Denies N/V/F/Ch.  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 , Disp: , Rfl:    Apoaequorin (PREVAGEN) 10 MG CAPS, Take 1 capsule by mouth every morning., Disp: 1 capsule, Rfl: 1   aspirin EC 81 MG EC tablet, Take 1 tablet (81 mg total) by mouth daily. Swallow whole., Disp: 30 tablet, Rfl: 11   atorvastatin (LIPITOR) 80 MG tablet, Take 1 tablet (80 mg total) by mouth every evening. (Patient taking differently: Take 40 mg by mouth daily.), Disp: 30 tablet, Rfl:  11   fexofenadine (ALLEGRA) 180 MG tablet, Take 180 mg by mouth daily as needed for allergies. , Disp: , Rfl:    fluticasone (FLONASE) 50 MCG/ACT nasal spray, Place 2 sprays into both nostrils daily., Disp: , Rfl: 2   meclizine (ANTIVERT) 25 MG tablet, Take 25 mg by mouth 3 (three) times daily as needed for dizziness. (Patient not taking: Reported on 07/10/2020), Disp: , Rfl:    SUMAtriptan (IMITREX) 25 MG tablet, Take 1 tablet (25 mg total) by mouth every 2 (two) hours as needed for up to 2 doses for migraine or headache. May repeat in 2 hours if headache persists or recurs., Disp: 10 tablet, Rfl: 1  Social History   Tobacco Use  Smoking Status Former   Packs/day: 0.25   Years: 54.00   Pack years: 13.50   Types: Cigarettes, E-cigarettes   Quit date: 10/25/2014   Years since quitting: 5.7  Smokeless Tobacco Never    Allergies  Allergen Reactions   Celebrex [Celecoxib] Anaphylaxis and Hives   Sulfonamide Derivatives Anaphylaxis, Swelling and Rash   Ace Inhibitors Cough   Dilaudid [Hydromorphone] Nausea And  Vomiting    Severe N/V   Erythromycin Nausea And Vomiting   Lactose Intolerance (Gi) Diarrhea    cramping   Sudafed [Pseudoephedrine Hcl]     Makes "high"   Losartan Palpitations   Mucinex [Guaifenesin Er] Palpitations   Objective:  There were no vitals filed for this visit. There is no height or weight on file to calculate BMI. Constitutional Well developed. Well nourished.  Vascular Dorsalis pedis pulses palpable bilaterally. Posterior tibial pulses palpable bilaterally. Capillary refill normal to all digits.  No cyanosis or clubbing noted. Pedal hair growth normal.  Neurologic Normal speech. Oriented to person, place, and time. Epicritic sensation to light touch grossly present bilaterally.  Dermatologic Nails well groomed and normal in appearance. No open wounds. No skin lesions.  Orthopedic: Generalized pain noted across the dorsum of the foot in the forefoot.   Pain in the metatarsal phalangeal joint joint proximal to it.  No pain with range of motion of the MPJ joint 1 through 5.  No deep intra-articular pain noted.  No extensor or flexor tendinitis noted.  No Mulder's click noted.  Negative signs for neuroma in the intermetatarsal spaces noted.   Radiographs: 3 views of skeletally mature left foot: No stress fracture noted.  Generalized osteopenia noted from disuse.  No other bony abnormalities identified.  No acute fractures noted.  No foreign body noted. Assessment:   1. Contusion of soft tissue    Plan:  Patient was evaluated and treated and all questions answered.  Left forefoot soft tissue contusion -I explained to patient the etiology of contusion various treatment options were discussed.  Given the amount of pain that she is having I believe she would benefit from cam boot immobilization to allow the pain to be more focalized.  Currently it is generalized across the dorsum of the forefoot.  Patient agrees with the plan and would like to obtain cam boot -She was fitted for a cam boot -If there is no improvement we will discuss steroid injection during next clinical visit.  No follow-ups on file.

## 2020-07-30 ENCOUNTER — Encounter: Payer: Medicare Other | Admitting: Physical Therapy

## 2020-07-31 ENCOUNTER — Ambulatory Visit: Payer: Medicare Other | Admitting: Adult Health

## 2020-07-31 NOTE — Progress Notes (Deleted)
[facility] [address] [place]. [place] [address] [phone]       OFFICE FOLLOW UP VISIT NOTE  [name] Date of Birth:  [birth year] Medical Record Number:  [id number]   Referring MD: [name] Reason for Referral: Stroke  HPI:   Today, 07/31/2020, [name] returns for 66-monthstroke follow-up.  She has been stable since prior visit without new stroke/TIA symptoms.  Reports residual ***.  Left arm and leg weakness -followed by orthopedics for adhesive capsulitis and left shoulder pain - eval by PT but cancelled further follow up visits.  She was evaluated in ED on 05/08/2020 after a fall - per note, possibly hit head and shoulder and ankle pain.  CT head and x-rays unremarkable.  Compliant on aspirin and atorvastatin without associated side effects.  Blood pressure today ***.       History provided for reference purposes only Update 01/31/2020 Dr. SLeonie Man She returns for follow-up after last visit 3 months ago.  She states she has had no further episodes of stroke or TIA.  She has had no migraine headaches either.  She continues to have some dizziness and gait imbalance and drags her left leg but she has had no falls or injuries.  She remains on aspirin 81 mg she is tolerating well without bruising or bleeding.  Blood pressure is usually well controlled today it is borderline at 141/90 in office.  She is tolerating Lipitor well without muscle aches and pains.  She is planning to get lab work done by her primary care physician at next visit with him in April.  She has not been driving as she is scared to drive.  She did undergo 30-day cardiac heart monitor which showed sinus rhythm without significant arrhythmias.  She has no new complaints  Initial visit 10/26/2019 Dr. SLeonie Man [name] a pleasant 65year old Caucasian lady seen today for initial office consultation visit for stroke.  She has past medical history of hypertension, chronic kidney disease and  anxiety.  [facility] onset of dizziness, headache and fell when she got out of bed.  She had to call EMS as she was staggering and unable to walk.  [facility] infar[facility] and CT angio showed right PCA occlusion in the P2 segment.  CT perfusion scan did not show significant penumbra to consider mechanical thrombectomy.  Echocardiogram showed normal ejection fraction without cardiac source of embolism.  LDL cholesterol 153 mg percent.  Hemoglobin A1c was 5.3.  Urine drug screen was negative.  Patient was transferred for rehabilitation to skilled nursing facility and is currently living at home.  She states that her peripheral vision loss is improved her gait and balance are also much better.  She has recently finished home physical and occupational therapy.  She uses a cane while walking long distances only but can walk indoors without it.  She has had no recent falls or has not bumped into any objects.  She does complain of decreased short-term memory since her stroke as well as residual numbness in the left hand and leg.  Occasionally she drags her left leg.  She is too scared to drive.  She is tolerating aspirin without bruising or bleeding and Lipitor without muscle aches and pains.  Her blood pressure is well controlled today it is 125/84.    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   Migraines    Stroke ([place]) 09/19/2019   Left side & vision   Thyroid disease    Hx of     Social History:  Social History   Socioeconomic History   Marital  status: Single    Spouse name: Not on file   Number of children: 5   Years of education: Not on file   Highest education level: Not on file  Occupational History   Occupation: Disabled    Employer: Therapist, occupational    Comment: assembly line  Tobacco Use   Smoking status: Former    Packs/day: 0.25    Years: 54.00    Pack years: 13.50    Types: Cigarettes, E-cigarettes    Quit date: 10/25/2014    Years since quitting: 5.7   Smokeless tobacco: Never  Vaping Use   Vaping Use: Every day  Substance and Sexual Activity   Alcohol use: No   Drug use: No   Sexual activity: Not on file  Other Topics Concern   Not on file  Social History Narrative   Pt donated kidney to daughter 5 yrs ago.   Lives Alone   Right Handed    Drinks 1 cup caffeine daily      Social Determinants of Health   Financial Resource Strain: Not on file  Food Insecurity: Not on file  Transportation Needs: Not on file  Physical Activity: Not on file  Stress: Not on file  Social Connections: Not on file  Intimate Partner Violence: Not on file    Medications:   Current Outpatient Medications on File Prior to Visit  Medication Sig Dispense Refill   acetaminophen (TYLENOL) 325 MG tablet Take 2 tablets (650 mg total) by mouth every 4 (four) hours as needed for mild pain (or temp > 37.5 C (99.5 F)).     amLODipine (NORVASC) 5 MG tablet Take 5 mg by mouth daily.      Apoaequorin (PREVAGEN) 10 MG CAPS Take 1 capsule by mouth every morning. 1 capsule 1   aspirin EC 81 MG EC tablet Take 1 tablet (81 mg total) by mouth daily. Swallow whole. 30 tablet 11   atorvastatin (LIPITOR) 80 MG tablet Take 1 tablet (80 mg total) by mouth every evening. (Patient taking differently: Take 40 mg by mouth daily.) 30 tablet 11   fexofenadine (ALLEGRA) 180 MG tablet Take 180 mg by mouth daily as needed for allergies.      fluticasone (FLONASE) 50 MCG/ACT nasal spray Place 2 sprays into both nostrils daily.  2   meclizine (ANTIVERT) 25 MG tablet  Take 25 mg by mouth 3 (three) times daily as needed for dizziness. (Patient not taking: Reported on 07/10/2020)     SUMAtriptan (IMITREX) 25 MG tablet Take 1 tablet (25 mg total) by mouth every 2 (two) hours as needed for up to 2 doses for migraine or headache. May repeat in 2 hours if headache persists or recurs. 10 tablet 1   No current facility-administered medications on file prior to visit.    Allergies:   Allergies  Allergen Reactions  Celebrex [Celecoxib] Anaphylaxis and Hives   Sulfonamide Derivatives Anaphylaxis, Swelling and Rash   Ace Inhibitors Cough   Dilaudid [Hydromorphone] Nausea And Vomiting    Severe N/V   Erythromycin Nausea And Vomiting   Lactose Intolerance (Gi) Diarrhea    cramping   Sudafed [Pseudoephedrine Hcl]     Makes "high"   Losartan Palpitations   Mucinex [Guaifenesin Er] Palpitations    Physical Exam There were no vitals filed for this visit. There is no height or weight on file to calculate BMI.   General: Frail middle-age Caucasian lady seated, in no evident distress Head: head normocephalic and atraumatic.   Neck: supple with no carotid or supraclavicular bruits Cardiovascular: regular rate and rhythm, no murmurs Musculoskeletal: no deformity Skin:  no rash/petichiae Vascular:  Normal pulses all extremities  Neurologic Exam Mental Status: Awake and fully alert. Oriented to place and time. Recent and remote memory intact. Attention span, concentration and fund of knowledge appropriate. Mood and affect appropriate.  Diminished recall 2/3.  Mini-Mental status exam not done today Cranial Nerves: Pupils equal, briskly reactive to light. Extraocular movements full without nystagmus. Visual fields show partial left temporal visual field defect hearing intact. Facial sensation intact. Face, tongue, palate moves normally and symmetrically.  Motor: Normal bulk and tone. Normal strength in all tested extremity muscles. Sensory.: intact to touch , pinprick  , position and vibratory sensation.  Coordination: Mild left finger-to-nose and knee to heel incoordination. Gait and Station: Arises from chair without difficulty. Stance is normal. Gait demonstrates wide base and mild ataxia with slight dragging of the left leg.   Reflexes: 1+ and symmetric. Toes downgoing.       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  Vascular risk factors of hypertension, hyperlipidemia and age.  Mild memory difficulties due to mild cognitive impairment post stroke     PLAN:  Continue aspirin 81 mg daily and atorvastatin 40 mg daily for secondary stroke prevention  maintain strict control of hypertension with blood pressure goal below 130/90 and lipids with LDL cholesterol goal below 70 mg/dL.   Follow-up in *** or call earlier if needed    CC:  [facility] provider: [name], MD    I spent *** minutes of face-to-face and non-face-to-face time with patient.  This included previsit chart review, lab review, study review, order entry, electronic health record documentation, patient education and discussion regarding history of prior stroke and etiology, secondary stroke prevention measures and aggressive stroke risk factor management, residual deficits of cognitive impairment and answered all other questions to patient's satisfaction  Frann Rider, [facility]  [facility] [address] [place], [place] [address]  Phone [phone] Fax [phone] Note: This document was prepared with digital dictation and possible smart phrase technology. Any transcriptional errors that result from this process are unintentional.

## 2020-08-01 ENCOUNTER — Encounter: Payer: Medicare Other | Admitting: Physical Therapy

## 2020-08-22 ENCOUNTER — Telehealth: Payer: Self-pay | Admitting: Neurology

## 2020-08-22 NOTE — Telephone Encounter (Signed)
Patient states she had a stroke last November 2021 she developed these shakes since she had the stroke and left the hospital.  Her left eye is closed and she is unable to open it, and has to use hot compresses to open it. Left arm from elbow down shakes when she tries to raise her arm and her hand so shakes so bad that is unable to even put her glasses.  Had therapy without any improvement. She has to grab her arm with her right arm or stick it close to her side to get it to stop shaking.  She said they called it tremors, she said she doesn't believe it is tremors but 'shaking', she still has left sided facial numbness.    Patient is [name], advertised on TV for nerve health.  She wants to know if that would be good for her to take or if this is permanent damage.  She said Dr. Leonie Man told her he didn't know what caused her stroke and she should be better 6 months to a year and its been almost a year and she is still having the same problem.    She believes the stroke was caused from the covid vaccine.    Okay to respond via [place].

## 2020-08-22 NOTE — Telephone Encounter (Signed)
Pt states ever since she had her stroke she has been shaking really bad. She states nobody ever told her about the damages that the stroke may have caused to her body. Pt is requesting a call back.

## 2020-08-23 ENCOUNTER — Ambulatory Visit: Payer: Medicare Other | Admitting: Podiatry

## 2020-08-30 ENCOUNTER — Ambulatory Visit (INDEPENDENT_AMBULATORY_CARE_PROVIDER_SITE_OTHER): Payer: Medicare Other | Admitting: Podiatry

## 2020-08-30 ENCOUNTER — Other Ambulatory Visit: Payer: Self-pay

## 2020-08-30 DIAGNOSIS — T148XXA Other injury of unspecified body region, initial encounter: Secondary | ICD-10-CM | POA: Diagnosis not present

## 2020-09-04 NOTE — Progress Notes (Signed)
Subjective:  Patient ID: [name], female    DOB: 1955/12/03,  MRN: [id number]  Chief Complaint  Patient presents with   Foot Pain    PT stated that she was unable to wear the boot     65 y.o. female presents with the above complaint.  Patient presents for follow-up of left dorsal midfoot pain with soft tissue contusion.  She states she is doing a lot better.  Her pain has gone down considerably.  She wore the boot for a little bit however she is not able to wear the boot for long period of time.   Review of Systems: Negative except as noted in the HPI. Denies N/V/F/Ch.  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 , Disp: , Rfl:    Apoaequorin (PREVAGEN) 10 MG CAPS, Take 1 capsule by mouth every morning., Disp: 1 capsule, Rfl: 1   aspirin EC 81 MG EC tablet, Take 1 tablet (81 mg total) by mouth daily. Swallow whole., Disp: 30 tablet, Rfl: 11   atorvastatin (LIPITOR) 80 MG tablet, Take 1 tablet (80 mg total) by mouth every evening. (Patient taking differently: Take 40 mg by mouth daily.), Disp: 30 tablet, Rfl: 11   fexofenadine (ALLEGRA) 180 MG tablet, Take 180 mg by mouth daily as needed for allergies. , Disp: , Rfl:    fluticasone (FLONASE) 50 MCG/ACT nasal spray, Place 2 sprays into both nostrils daily., Disp: , Rfl: 2   meclizine (ANTIVERT) 25 MG tablet, Take 25 mg by mouth 3 (three) times daily as needed for dizziness. (Patient not taking: Reported on  07/10/2020), Disp: , Rfl:    SUMAtriptan (IMITREX) 25 MG tablet, Take 1 tablet (25 mg total) by mouth every 2 (two) hours as needed for up to 2 doses for migraine or headache. May repeat in 2 hours if headache persists or recurs., Disp: 10 tablet, Rfl: 1  Social History   Tobacco Use  Smoking Status Former   Packs/day: 0.25   Years: 54.00   Pack years: 13.50   Types: Cigarettes, E-cigarettes   Quit date: 10/25/2014   Years since quitting: 5.8  Smokeless Tobacco Never    Allergies  Allergen Reactions   Celebrex [Celecoxib] Anaphylaxis and Hives   Sulfonamide Derivatives Anaphylaxis, Swelling and Rash   Ace Inhibitors Cough   Dilaudid [Hydromorphone] Nausea And Vomiting    Severe N/V   Erythromycin Nausea And Vomiting   Lactose Intolerance (Gi) Diarrhea    cramping   Sudafed [Pseudoephedrine Hcl]     Makes "high"   Losartan Palpitations   Mucinex [Guaifenesin Er] Palpitations   Objective:  There were no vitals filed for this visit. There is no height or weight on file to calculate BMI. Constitutional Well developed. Well nourished.  Vascular Dorsalis pedis pulses palpable bilaterally.  Posterior tibial pulses palpable bilaterally. Capillary refill normal to all digits.  No cyanosis or clubbing noted. Pedal hair growth normal.  Neurologic Normal speech. Oriented to person, place, and time. Epicritic sensation to light touch grossly present bilaterally.  Dermatologic Nails well groomed and normal in appearance. No open wounds. No skin lesions.  Orthopedic: No further generalized pain noted across the dorsum of the foot in the forefoot.  No further pain in the metatarsal phalangeal joint joint proximal to it.  No pain with range of motion of the MPJ joint 1 through 5.  No deep intra-articular pain noted.  No extensor or flexor tendinitis noted.  No Mulder's click noted.  Negative signs for neuroma in the intermetatarsal spaces noted.   Radiographs: 3 views of skeletally  mature left foot: No stress fracture noted.  Generalized osteopenia noted from disuse.  No other bony abnormalities identified.  No acute fractures noted.  No foreign body noted. Assessment:   1. Contusion of soft tissue     Plan:  Patient was evaluated and treated and all questions answered.  Left forefoot soft tissue contusion -Clinically her pain has considerably gotten better.  She tries to wear the boot for first few days unfortunately she was unable to.  However her pain has resolved by itself without any acute intervention.  I discussed with her the importance of shoe gear modification in extensive detail.  She states understanding and will wear more comfortable better sneakers.  No follow-ups on file.

## 2020-09-13 ENCOUNTER — Ambulatory Visit (INDEPENDENT_AMBULATORY_CARE_PROVIDER_SITE_OTHER): Payer: Medicare Other | Admitting: Neurology

## 2020-09-13 ENCOUNTER — Encounter: Payer: Self-pay | Admitting: Neurology

## 2020-09-13 VITALS — BP 131/82 | HR 83 | Ht 67.0 in | Wt 128.0 lb

## 2020-09-13 DIAGNOSIS — I6621 Occlusion and stenosis of right posterior cerebral artery: Secondary | ICD-10-CM

## 2020-09-13 DIAGNOSIS — R270 Ataxia, unspecified: Secondary | ICD-10-CM

## 2020-09-13 DIAGNOSIS — R200 Anesthesia of skin: Secondary | ICD-10-CM

## 2020-09-13 NOTE — Patient Instructions (Addendum)
Continue with physical therapy  Follow up with Dr. Leonie Man as scheduled

## 2020-09-13 NOTE — Progress Notes (Signed)
[facility]  PATIENT: [name] DOB: May 07, 1955  REFERRING CLINICIAN: [name], MD HISTORY FROM: Patient  REASON FOR VISIT: Left arm shaking/numbness    HISTORICAL  CHIEF COMPLAINT:  Chief Complaint  Patient presents with   Shaking/Tremors    New patient: internal referral from Dr. Leonie Man: patient has developed numbness in left leg up to thigh and left hand shakes/tremors. 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  Back in September 2021 patient presented to the ED with complaint of dizziness, headache and fall and had a MRI which showed a acute right PCA occlusion.  She was admitted for stroke work-up.  Her LDL was elevated at that time and started on a statin; her hemoglobin A1c was normal and an echocardiogram showed normal ejection fraction.  She has been following with Dr. Leonie Man in this clinic for stroke management.  Patient mentioned that she has been healthy prior to the stroke, she reported after taking her second COVID-vaccine is when her symptoms started.  She believes that the stroke is related to the vaccine.  She is presenting today with new complaint of left arm clumsiness, patient said that the left arm is shaking uncontrollably when trying to reach for an object or when trying to use the left arm.  For her left hand, it is still numb, described as "feels like sand paper".  Patient said that this symptom started since the stroke, it is not new.  She also have had numbness since stroke, left droopiness and numbness on the left side of the face, states the numbness is going up to her left eye.  Again the symptoms are not new per patient.  Her current medication include amlodipine for hypertension, atorvastatin for cholesterol she is also on sumatriptan for  headaches but has not taken it recently.  She states that she is unable to go to a physical therapy because she is currently not driving.  She is actively doing some exercises at home, and uses the gym at her complex.  She denies any recent fall.   OTHER MEDICAL CONDITIONS: Right PCA stroke, hypertension, hyperlipidemia.   REVIEW OF SYSTEMS: Full 14 system review of systems performed and negative with exception of: As noted in the HPI.   ALLERGIES: Allergies  Allergen Reactions   Celebrex [Celecoxib] Anaphylaxis and Hives   Sulfonamide Derivatives Anaphylaxis, Swelling and Rash   Ace Inhibitors Cough   Dilaudid [Hydromorphone] Nausea And Vomiting    Severe N/V   Erythromycin Nausea And Vomiting   Lactose Intolerance (Gi) Diarrhea    cramping   Sudafed [Pseudoephedrine Hcl]     Makes "high"   Losartan Palpitations   Mucinex [Guaifenesin Er] Palpitations    HOME MEDICATIONS: Outpatient Medications Prior to Visit  Medication Sig Dispense Refill   acetaminophen (TYLENOL) 325 MG tablet Take 2 tablets (650 mg total) by mouth every 4 (four) hours as needed for mild pain (or temp > 37.5 C (99.5 F)).     amLODipine (NORVASC) 5 MG tablet Take 5 mg by mouth daily.      Apoaequorin (PREVAGEN) 10 MG CAPS Take 1 capsule by mouth every morning. 1 capsule 1   aspirin EC 81 MG EC tablet Take 1 tablet (81 mg total) by mouth daily. Swallow whole. 30 tablet 11  atorvastatin (LIPITOR) 40 MG tablet Take 40 mg by mouth every other day.     fexofenadine (ALLEGRA) 180 MG tablet Take 180 mg by mouth daily as needed for allergies.      fluticasone (FLONASE) 50 MCG/ACT nasal spray Place 2 sprays into both nostrils daily.  2   meclizine (ANTIVERT) 25 MG tablet Take 25 mg by mouth 3 (three) times daily as needed for dizziness.     SUMAtriptan (IMITREX) 25 MG tablet Take 1 tablet (25 mg total) by mouth every 2 (two) hours as needed for up to 2 doses for migraine or headache. May repeat in 2 hours if headache  persists or recurs. 10 tablet 1   atorvastatin (LIPITOR) 80 MG tablet Take 1 tablet (80 mg total) by mouth every evening. (Patient taking differently: Take 40 mg by mouth daily.) 30 tablet 11   No facility-administered medications prior to visit.    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   DORSAL COMPARTMENT RELEASE Right 02/07/2013   Procedure: RIGHT WRIST DEQUERVAIN RELEASE;  Surgeon: Jolyn Nap, MD;  Location: [facility];  Service: Orthopedics;  Laterality: Right;   KIDNEY DONATION  2008   gave her lt kidney to daughter   NECK MASS EXCISION     rt-mass   [place]   rt ? parotidectomy   SHOULDER ARTHROSCOPY WITH OPEN ROTATOR CUFF REPAIR AND DISTAL CLAVICLE ACROMINECTOMY Right 04/25/2014   Procedure: SHOULDER ARTHROSCOPY WITH MINI-OPEN ROTATOR CUFF REPAIR AND DISTAL CLAVICLE RESECTION;  Surgeon: Garald Balding, MD;  Location: [facility];  Service: Orthopedics;  Laterality: Right;   SHOULDER CLOSED REDUCTION Right 08/31/2014   Procedure: CLOSED MANIPULATION SHOULDER;  Surgeon: Garald Balding, MD;  Location: [facility];  Service: Orthopedics;  Laterality: Right;   SUBACROMIAL DECOMPRESSION Right 04/25/2014   Procedure: SUBACROMIAL DECOMPRESSION;  Surgeon: Garald Balding, MD;  Location: [facility];  Service: Orthopedics;  Laterality: Right;   THYROID SURGERY     bx    FAMILY HISTORY: Family History  Problem Relation Age of  Onset   Arthritis Mother        rheumatoid   Hypertension Mother    Heart disease Mother        Irregular heart beat    Arrhythmia Mother    Rheum arthritis Mother    Pulmonary fibrosis Mother    Cancer Father        Lung   COPD Father    Hypertension Sister    Aneurysm Brother 16       brain   Heart disease Maternal Aunt    Pulmonary fibrosis Maternal Aunt    Arthritis Maternal Uncle        rheumatoid   Pulmonary fibrosis Maternal Uncle    Heart disease Maternal Grandmother    Diabetes Maternal  Uncle    Hypertension Sister    Colon cancer Neg Hx    Colon polyps Neg Hx     SOCIAL HISTORY: Social History   Socioeconomic History   Marital status: Single    Spouse name: Not on file   Number of children: 5   Years of education: Not on file   Highest education level: Not on file  Occupational History   Occupation: Disabled    Employer: Therapist, occupational    Comment: assembly line  Tobacco Use   Smoking status: Former    Packs/day: 0.25    Years: 54.00    Pack years: 13.50    Types: Cigarettes, E-cigarettes    Quit date: 10/25/2014    Years since quitting: 5.8   Smokeless tobacco: Never  Vaping Use   Vaping Use: Every day  Substance and Sexual Activity   Alcohol use: No   Drug use: No   Sexual activity: Not on file  Other Topics Concern   Not on file  Social History Narrative   Pt donated kidney to daughter 5 yrs ago.   Lives with son and his family   Right Handed    Drinks 3-4 cups caffeine daily      Social Determinants of Health   Financial Resource Strain: Not on file  Food Insecurity: Not on file  Transportation Needs: Not on file  Physical Activity: Not on file  Stress: Not on file  Social Connections: Not on file  Intimate Partner Violence: Not on file     PHYSICAL EXAM  GENERAL EXAM/CONSTITUTIONAL: Vitals:  Vitals:   09/13/20 0840  BP: 131/82  Pulse: 83  Weight: 128 lb (58.1 kg)  Height: '5\' 7"'$  (1.702 m)   Body mass index is 20.05 kg/m. Wt  Readings from Last 3 Encounters:  09/13/20 128 lb (58.1 kg)  06/27/20 124 lb (56.2 kg)  01/31/20 124 lb 12.8 oz (56.6 kg)   Patient is in no distress; well developed, nourished and groomed; neck is supple  CARDIOVASCULAR: Examination of carotid arteries is normal; no carotid bruits Regular rate and rhythm, no murmurs Examination of peripheral vascular system by observation and palpation is normal  EYES: Pupils round and reactive to light. Extraocular movements intacts, there is left visual field cut.   MUSCULOSKELETAL: Gait, strength, tone, movements noted in Neurologic exam below  NEUROLOGIC: MENTAL STATUS:  awake, alert, oriented to person, place and time recent and remote memory intact normal attention and concentration language fluent, comprehension intact, naming intact fund of knowledge appropriate  CRANIAL NERVE:  2nd, 3rd, 4th, 6th - pupils equal and reactive to light, there is left visual field cut, extraocular muscles intact, no nystagmus 5th - facial sensation symmetric 7th - There is left lower facial weakness.  8th - hearing intact 9th - palate elevates symmetrically, uvula midline 11th - shoulder shrug symmetric 12th - tongue protrusion midline  MOTOR:  normal bulk and tone. There is a left pronator. full strength in the BUE, BLE to confrontation. There is limited range of motion of the left shoulder, will use right arm to lift left arm up to 90 degrees.   SENSORY:  normal and symmetric to light touch, pinprick except for left palm (decrease light touch and vibration)  COORDINATION:  There is ataxia on left FNF  REFLEXES:  deep tendon reflexes present and symmetric  GAIT/STATION:  Able to stand unassisted, has a mild hemiplegic gait.      DIAGNOSTIC DATA (LABS, IMAGING, TESTING) - I reviewed patient  records, labs, notes, testing and imaging myself where available.  Lab Results  Component Value Date   WBC 6.2 11/03/2019   HGB 14.3 11/03/2019    HCT 43.8 11/03/2019   MCV 89.8 11/03/2019   PLT 216 11/03/2019      Component Value Date/Time   NA 140 11/03/2019 1647   K 3.9 11/03/2019 1647   CL 104 11/03/2019 1647   CO2 28 11/03/2019 1647   GLUCOSE 108 (H) 11/03/2019 1647   BUN 15 11/03/2019 1647   CREATININE 1.16 (H) 11/03/2019 1647   CREATININE 1.22 (H) 11/06/2015 1001   CALCIUM 9.8 11/03/2019 1647   PROT 7.4 09/19/2019 0856   ALBUMIN 4.1 09/19/2019 0856   AST 18 09/19/2019 0856   ALT 14 09/19/2019 0856   ALKPHOS 92 09/19/2019 0856   BILITOT 0.6 09/19/2019 0856   GFRNONAA 52 (L) 11/03/2019 1647   GFRNONAA 56 (L) 06/19/2015 0821   GFRAA >60 09/20/2019 0426   GFRAA 64 06/19/2015 0821   Lab Results  Component Value Date   CHOL 237 (H) 09/20/2019   HDL 68 09/20/2019   LDLCALC 153 (H) 09/20/2019   TRIG 82 09/20/2019   CHOLHDL 3.5 09/20/2019   Lab Results  Component Value Date   HGBA1C 5.3 09/19/2019   Lab Results  Component Value Date   VITAMINB12 312 02/11/2016   Lab Results  Component Value Date   TSH 1.09 10/02/2014    MRI Brain reviewed:  1. Fairly widely scattered acute infarcts in the Right PCA territory. Involvement in the medial right occipital lobe, occipital pole, but also involving the right hippocampal formation, right thalamus, splenium.   2. No associated hemorrhage or mass effect.   3. No other acute intracranial abnormality. Mild to moderate for age underlying cerebral white matter signal changes.   ASSESSMENT AND PLAN  65 y.o. year old female with vascular risk factor of hypertension and hyperlipidemia who is presenting after a embolic stroke in September 2021 for left hand numbness and left arm shaking.  Patient mentioned that since the stroke she has had visual deficit with the left eye,  left-sided weakness, left facial numbness and left hand numbness.  Reported that she feels left facial numbness is worsening and now involving under left eye.  She said that it is difficult to use her  left hand because she has uncontrolled shaking of the left hand/arm when attempting to reach for an object.  She described as "the left hand is doing what ever the left hand wants to do".  During this time she has to hold the left hand across her body which has now caused decreased range of motion over the left shoulder.  On exam there is left facial weakness, left visual field cut, a left pronator drift and numbness on the left hand.  She is also noted to be ataxic on finger-nose-finger with the left arm.  I discussed with patient that the symptoms are from her previous stroke, they have been present since the stroke and she needs to continue with physical therapy.  She mentioned that she is unable to get to physical therapy because she is not driving but she is doing the exercises at home and at her gym in her apartment complex.  I advised her to continue with aggressive physical therapy and to continue follow-up with Dr. Leonie Man.  No change in medication, no indications to repeat scans.   1. Posterior cerebral artery embolism, right   2. Numbness   3. Ataxia  PLAN: Continue with physical therapy  Follow up with Dr. Leonie Man as scheduled   No orders of the defined types were placed in this encounter.   No orders of the defined types were placed in this encounter.   No follow-ups on file.    Alric Ran, MD 09/13/2020, 9:37 AM  [facility] [address] [place], [place] [address] [phone]

## 2020-09-17 ENCOUNTER — Telehealth: Payer: Self-pay | Admitting: Neurology

## 2020-09-17 NOTE — Telephone Encounter (Signed)
Called patient, discussed about MRI result.  She was worried that the white spots on her MRI were actually clots.  I reviewed the scan again with patient and explained to her that those are damages from her stroke.  She will follow-up as scheduled    Alric Ran, MD

## 2020-09-17 NOTE — Telephone Encounter (Signed)
Pt called, was shown a scan at last office visit. Question is: the white spots on the scan where the damage is or is there clots there? Would like a call from the nurse.

## 2020-10-04 ENCOUNTER — Ambulatory Visit (INDEPENDENT_AMBULATORY_CARE_PROVIDER_SITE_OTHER): Payer: Medicare Other | Admitting: Adult Health

## 2020-10-04 ENCOUNTER — Other Ambulatory Visit: Payer: Self-pay

## 2020-10-04 ENCOUNTER — Encounter: Payer: Self-pay | Admitting: Adult Health

## 2020-10-04 VITALS — BP 116/72 | HR 77 | Ht 67.0 in | Wt 130.0 lb

## 2020-10-04 DIAGNOSIS — I69393 Ataxia following cerebral infarction: Secondary | ICD-10-CM

## 2020-10-04 DIAGNOSIS — I63431 Cerebral infarction due to embolism of right posterior cerebral artery: Secondary | ICD-10-CM | POA: Diagnosis not present

## 2020-10-04 NOTE — Patient Instructions (Signed)
Would recommend trialing a muscle relaxer to see if this helps with shaking and muscle tightness.  Would highly recommend that you work with structured physical therapy but if unable to at this time, I printed out additional exercises for you to try at home as long as safety permits  Continue aspirin 81 mg daily  and atorvastatin 40 mg daily for secondary stroke prevention  Continue to follow up with PCP regarding cholesterol and blood pressure management  Maintain strict control of hypertension with blood pressure goal below 130/90 and cholesterol with LDL cholesterol (bad cholesterol) goal below 70 mg/dL.       Followup in the future with me in 6 months or call earlier if needed       Thank you for coming to see [place] at [facility]. I hope we have been able to provide you high quality care today.  You may receive a patient satisfaction survey over the next few weeks. We would appreciate your feedback and comments so that we may continue to improve ourselves and the health of our patients.

## 2020-10-04 NOTE — Progress Notes (Signed)
[facility] [address] [place]. [place] [address] [phone]       OFFICE FOLLOW UP VISIT NOTE  [name] Date of Birth:  11/23/1955 Medical Record Number:  [id number]    Primary neurologist: Dr. Leonie Man Referring MD: Allyn Kenner Reason for Referral: Stroke  Chief Complaint  Patient presents with   Follow-up    RM 2 alone PT is well, she is easily fatigued, has no control over her L hand. Having some shaking, weakness and numbness on left side as well as facial numbness       HPI:   Initial visit 10/26/2019 Dr. Leonie Man: [name] is a pleasant 65 year old Caucasian lady seen today for initial office consultation visit for stroke.  She has past medical history of hypertension, chronic kidney disease and anxiety.  She presented to [facility] on 09/19/2019 with sudden onset of dizziness, headache and fell when she got out of bed.  She had to call EMS as she was staggering and unable to walk.  She was admitted to [facility] where MRI scan showed a right posterior cerebral artery infarct and CT angio showed right PCA occlusion in the P2 segment.  CT perfusion scan did not show significant penumbra to consider mechanical thrombectomy.  Echocardiogram showed normal ejection fraction without cardiac source of embolism.  LDL cholesterol 153 mg percent.  Hemoglobin A1c was 5.3.  Urine drug screen was negative.  Patient was transferred for rehabilitation to skilled nursing facility and is currently living at home.  She states that her peripheral vision loss is improved her gait and balance are also much better.  She has recently finished home physical and occupational therapy.  She uses a cane while walking long distances only but can walk indoors without it.  She has had no recent falls or has not bumped into any objects.  She does complain of decreased short-term memory since her stroke as well as residual numbness in the left hand and leg.   Occasionally she drags her left leg.  She is too scared to drive.  She is tolerating aspirin without bruising or bleeding and Lipitor without muscle aches and pains.  Her blood pressure is well controlled today it is 125/84. Update 01/31/2020 Dr. Leonie Man: She returns for follow-up after last visit 3 months ago.  She states she has had no further episodes of stroke or TIA.  She has had no migraine headaches either.  She continues to have some dizziness and gait imbalance and drags her left leg but she has had no falls or injuries.  She remains on aspirin 81 mg she is tolerating well without bruising or bleeding.  Blood pressure is usually well controlled today it is borderline at 141/90 in office.  She is tolerating Lipitor well without muscle aches and pains.  She is planning to get lab work done by her primary care physician at next visit with him in April.  She has not been driving as she is scared to drive.  She did undergo 30-day cardiac heart monitor which showed sinus rhythm without significant arrhythmias.  She has no new complaints  Update 10/04/2020 JM: Returns for stroke follow-up after prior visit with Dr. Leonie Man 8 months ago.  She has multiple complaints today including easily fatigued, left arm shaking, left-sided weakness and numbness as well as facial numbness.  All presents since her stroke in 09/2019. Denies new stroke/TIA symptoms. She was seen by Dr. April Manson on 09/13/2020 for left arm shaking  and left-sided numbness which has been present since her stroke.  Evaluated by PT 7/5 for left shoulder pain with adhesive capsulitis but has not scheduled any follow-up visit since initial evaluation due to transportation issues.   She is overly frustrated due to continued deficits and left arm shaking that interferes with daily activity and functioning.  She continues to do exercises at home as well as at her local gym. She does have appt today for acupuncture to see if this will help.  Left hand feels like  sandpaper.  Left foot feels tight when its in a shoe.  Denies any type of nerve pain such as pins/needles, burning or electrical shock sensation.  Does have left shoulder pain which can be worse in the morning.    Compliant on aspirin and atorvastatin without side effects.  Blood pressure today 116/72.  Routinely monitors at home which has been stable.       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   Migraines    Stroke ([place]) 09/19/2019   Left side & vision   Thyroid disease    Hx of     Social History:  Social History   Socioeconomic History   Marital status: Single    Spouse name: Not on file   Number of children: 5   Years of education: Not on file   Highest education level: Not on file  Occupational History   Occupation: Disabled    Employer: Therapist, occupational    Comment: assembly line  Tobacco Use   Smoking status: Former    Packs/day: 0.25    Years: 54.00    Pack years: 13.50    Types: Cigarettes, E-cigarettes    Quit date: 10/25/2014    Years since quitting: 5.9   Smokeless tobacco: Never  Vaping Use   Vaping Use: Every day  Substance and Sexual Activity   Alcohol use: No   Drug use: No   Sexual activity: Not on file  Other Topics Concern   Not on file  Social History Narrative   Pt donated kidney to daughter 5 yrs ago.   Lives with son and his family   Right Handed    Drinks 3-4 cups caffeine daily      Social Determinants of Health   Financial Resource Strain: Not on file  Food Insecurity: Not on file  Transportation Needs: Not on file  Physical Activity: Not on file  Stress: Not on file  Social Connections: Not on file  Intimate Partner Violence: Not on file     Medications:   Current Outpatient Medications on File Prior to Visit  Medication Sig Dispense Refill   acetaminophen (TYLENOL) 325 MG tablet Take 2 tablets (650 mg total) by mouth every 4 (four) hours as needed for mild pain (or temp > 37.5 C (99.5 F)).     amLODipine (NORVASC) 5 MG tablet Take 5 mg by mouth daily.      aspirin EC 81 MG EC tablet Take 1 tablet (81 mg total) by mouth daily. Swallow whole. 30 tablet 11   atorvastatin (LIPITOR) 40 MG tablet Take 40 mg by  mouth every other day.     fexofenadine (ALLEGRA) 180 MG tablet Take 180 mg by mouth daily as needed for allergies.      fluticasone (FLONASE) 50 MCG/ACT nasal spray Place 2 sprays into both nostrils daily.  2   meclizine (ANTIVERT) 25 MG tablet Take 25 mg by mouth 3 (three) times daily as needed for dizziness.     SUMAtriptan (IMITREX) 25 MG tablet Take 1 tablet (25 mg total) by mouth every 2 (two) hours as needed for up to 2 doses for migraine or headache. May repeat in 2 hours if headache persists or recurs. 10 tablet 1   No current facility-administered medications on file prior to visit.    Allergies:   Allergies  Allergen Reactions   Celebrex [Celecoxib] Anaphylaxis and Hives   Sulfonamide Derivatives Anaphylaxis, Swelling and Rash   Ace Inhibitors Cough   Dilaudid [Hydromorphone] Nausea And Vomiting    Severe N/V   Erythromycin Nausea And Vomiting   Lactose Intolerance (Gi) Diarrhea    cramping   Sudafed [Pseudoephedrine Hcl]     Makes "high"   Losartan Palpitations   Mucinex [Guaifenesin Er] Palpitations    Physical Exam Today's Vitals   10/04/20 0858  BP: 116/72  Pulse: 77  Weight: 130 lb (59 kg)  Height: 5\' 7"  (1.702 m)   Body mass index is 20.36 kg/m.   General: Frail pleasant middle-age Caucasian lady, seated mildly anxious Head: head normocephalic and atraumatic.   Neck: supple with no carotid or supraclavicular bruits Cardiovascular: regular rate and rhythm, no murmurs Musculoskeletal:  no deformity Skin:  no rash/petichiae Vascular:  Normal pulses all extremities  Neurologic Exam Mental Status: Awake and fully alert.  Fluent speech and language.  Oriented to place and time. Recent and remote memory intact. Attention span, concentration and fund of knowledge appropriate. Mood and affect appropriate.   Cranial Nerves: Pupils equal, briskly reactive to light. Extraocular movements full without nystagmus. Visual fields show partial left temporal visual field defect.  Hearing intact. Facial sensation intact. Face, tongue, palate moves normally and symmetrically.  Motor: Normal bulk and tone. Normal strength in all tested extremity muscles.  Limited left shoulder ROM Sensory.: intact to touch , pinprick , position and vibratory sensation.  Coordination: Moderate left finger-to-nose and mild knee to heel incoordination.  Decreased left hand dexterity.  Normal on right side. Gait and Station: Arises from chair without difficulty. Stance is normal. Gait demonstrates wide base and mild ataxia with decreased left leg step height without use of assistive device.  Tandem walk and heel toe not attempted. Reflexes: 1+ and symmetric. Toes downgoing.      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  Vascular risk factors of hypertension, hyperlipidemia and age.      PLAN:  -Discussed importance of working with structured PT but unfortunately not able to participate in outpatient PT due to transportation issues and not able to do [facility] PT due to insurance [employer]).    -Provided exercises that she can do at home for continued ataxia but did advise her to call office if she is able to secure transportation to participate in OP PT  -Discussed potential benefit with use of  muscle relaxants.  She reports trialing a muscle relaxant in the past but unable to tolerate but unsure what one. She will call  office once she finds out to discuss possibly trialing a different kind  -No associated pain with left-sided numbness therefore no indication to try medication such as gabapentin -Long discussion regarding likely limited further recovery and she is now 1 year out from her stroke.  Will be important for her to start working on compensation strategies   -Continue aspirin 81 mg daily  for secondary stroke prevention -30-day cardiac event monitor negative for atrial fibrillation -Continue to follow-up with PCP Dr. Nevada Crane for aggressive stroke risk factor management -HTN: BP goal<130/90.  Stable on current regimen per PCP -HLD: LDL goal<70.  Continue atorvastatin 40 mg daily per PCP.  Reports lipid panel routinely monitored by PCP which has been stable (unable to view via epic)   Follow-up in 6 months or call earlier if needed   CC:  [facility] provider: Dr. Broadus John, Edwinna Areola, MD    I spent 38 minutes of face-to-face and non-face-to-face time with patient.  This included previsit chart review, lab review, study review, electronic health record documentation, patient education and discussion regarding prior stroke with residual stroke deficits, further interventions including exercises and compensation strategies as well as possible benefit with use of medications, secondary stroke prevention measures and importance of aggressive stroke risk factor management and answered all other questions to patient satisfaction  Frann Rider, AGNP-BC  [facility] [address] [place], [place] [address]  Phone [phone] Fax [phone] Note: This document was prepared with digital dictation and possible smart phrase technology. Any transcriptional errors that result from this process are unintentional.

## 2020-10-07 NOTE — Progress Notes (Signed)
I agree with the above plan 

## 2020-10-09 ENCOUNTER — Ambulatory Visit: Payer: Medicare Other | Admitting: Adult Health

## 2020-11-14 ENCOUNTER — Telehealth: Payer: Self-pay | Admitting: Adult Health

## 2020-11-14 NOTE — Telephone Encounter (Signed)
Contacted pt back, she stated the Tingling, numbness and shaking is driving her crazy. She Informed Janett Billow she had muscle relaxer's  at home during last visit and was suppose to let us know what kind but did not. Any suggestions ?  Methocarbamol is what she has

## 2020-11-14 NOTE — Telephone Encounter (Signed)
We can trial baclofen at low dose to see if this helps. If should would like to try, I will place order and she can let us know after 1-2 weeks if any benefit or sooner if difficulty tolerating.

## 2020-11-14 NOTE — Telephone Encounter (Signed)
Pt called wanting to discuss the tingling and numbness that she has experiencing. Pt states she feels like it is getting worse. Please advise.

## 2020-11-15 MED ORDER — BACLOFEN 5 MG PO TABS: 5.0000 mg | ORAL_TABLET | Freq: Two times a day (BID) | ORAL | 5 refills | Status: DC

## 2020-11-15 NOTE — Telephone Encounter (Signed)
Contact pt, she is agreeable to this plan. [facility] [address] [place]

## 2020-11-15 NOTE — Addendum Note (Signed)
Addended by: Frann Rider L on: 11/15/2020 10:41 AM   Modules accepted: Orders

## 2020-11-27 NOTE — Telephone Encounter (Signed)
Pt states the tingling and numbness is still bothering her. She says the b12 did not help or a nerve pill. Pt requesting a  call back.

## 2020-11-27 NOTE — Telephone Encounter (Signed)
Any other suggestions for this pt ? 

## 2020-11-28 NOTE — Telephone Encounter (Signed)
At prior visit, denied any pain associated with her numbness therefore use of medications such as gabapentin would likely not help as these medications are more for nerve type pain which again she denied having at prior visit.  Unfortunately, there is not much that we can do in regards to numbness but would encourage her to continue to exercise routinely and continued use with that side. Thank you.

## 2020-12-13 DIAGNOSIS — I10 Essential (primary) hypertension: Secondary | ICD-10-CM | POA: Diagnosis not present

## 2020-12-13 DIAGNOSIS — R7301 Impaired fasting glucose: Secondary | ICD-10-CM | POA: Diagnosis not present

## 2020-12-17 DIAGNOSIS — R251 Tremor, unspecified: Secondary | ICD-10-CM | POA: Diagnosis not present

## 2020-12-17 DIAGNOSIS — Z8673 Personal history of transient ischemic attack (TIA), and cerebral infarction without residual deficits: Secondary | ICD-10-CM | POA: Diagnosis not present

## 2021-01-14 DIAGNOSIS — R251 Tremor, unspecified: Secondary | ICD-10-CM | POA: Diagnosis not present

## 2021-01-21 ENCOUNTER — Telehealth: Payer: Self-pay | Admitting: Family Medicine

## 2021-01-21 NOTE — Telephone Encounter (Signed)
Erroneous encounter

## 2021-01-28 ENCOUNTER — Telehealth: Payer: Self-pay | Admitting: Adult Health

## 2021-01-28 NOTE — Telephone Encounter (Signed)
Pt called states she is tired of taking medication for her tremors due to the fact that it gives her bad side affects. Pt is asking about a device called [employer]. Pt requesting a call back.

## 2021-01-28 NOTE — Telephone Encounter (Signed)
I called the pt and we discussed this message. Due to ET not being diagnosed, pt would not qualify for [facility] device. Pt is amiable to trial of compression stocking for left arm to see if numbness improves.  Pt was advised we could further discuss in March.

## 2021-02-08 DIAGNOSIS — R5383 Other fatigue: Secondary | ICD-10-CM | POA: Diagnosis not present

## 2021-02-08 DIAGNOSIS — R059 Cough, unspecified: Secondary | ICD-10-CM | POA: Diagnosis not present

## 2021-02-08 DIAGNOSIS — Z87891 Personal history of nicotine dependence: Secondary | ICD-10-CM | POA: Diagnosis not present

## 2021-02-11 LAB — TSH: TSH: 0.03 — AB (ref 0.41–5.90)

## 2021-02-21 ENCOUNTER — Telehealth: Payer: Self-pay

## 2021-02-21 NOTE — Telephone Encounter (Signed)
Inbound call from pt inquiring about a inbound referral.

## 2021-03-14 NOTE — Telephone Encounter (Signed)
Referral send to provider for review. ?

## 2021-03-26 ENCOUNTER — Ambulatory Visit (INDEPENDENT_AMBULATORY_CARE_PROVIDER_SITE_OTHER): Payer: Medicare Other | Admitting: Nurse Practitioner

## 2021-03-26 ENCOUNTER — Other Ambulatory Visit: Payer: Self-pay

## 2021-03-26 ENCOUNTER — Encounter: Payer: Self-pay | Admitting: Nurse Practitioner

## 2021-03-26 VITALS — BP 117/76 | HR 73 | Ht 66.25 in | Wt 132.8 lb

## 2021-03-26 DIAGNOSIS — R7989 Other specified abnormal findings of blood chemistry: Secondary | ICD-10-CM | POA: Diagnosis not present

## 2021-03-26 DIAGNOSIS — R768 Other specified abnormal immunological findings in serum: Secondary | ICD-10-CM | POA: Diagnosis not present

## 2021-03-26 DIAGNOSIS — E0590 Thyrotoxicosis, unspecified without thyrotoxic crisis or storm: Secondary | ICD-10-CM | POA: Diagnosis not present

## 2021-03-26 DIAGNOSIS — E042 Nontoxic multinodular goiter: Secondary | ICD-10-CM | POA: Diagnosis not present

## 2021-03-26 NOTE — Patient Instructions (Signed)
Hyperthyroidism ?Hyperthyroidism is when the thyroid gland is too active (overactive). The thyroid gland is a small gland located in the lower front part of the neck, just in front of the windpipe (trachea). This gland makes hormones that help control how the body uses food for energy (metabolism) as well as how the heart and brain function. These hormones also play a role in keeping your bones strong. When the thyroid is overactive, it produces too much of a hormone called thyroxine. ?What are the causes? ?This condition may be caused by: ?Graves' disease. This is a disorder in which the body's disease-fighting system (immune system) attacks the thyroid gland. This is the most common cause. ?Inflammation of the thyroid gland. ?A tumor in the thyroid gland. ?Use of certain medicines, including: ?Prescription thyroid hormone replacement. ?Herbal supplements that mimic thyroid hormones. ?Amiodarone therapy. ?Solid or fluid-filled lumps within your thyroid gland (thyroid nodules). ?Taking in a large amount of iodine from foods or medicines. ?What increases the risk? ?You are more likely to develop this condition if: ?You are female. ?You have a family history of thyroid conditions. ?You smoke tobacco. ?You use a medicine called lithium. ?You take medicines that affect the immune system (immunosuppressants). ?What are the signs or symptoms? ?Symptoms of this condition include: ?Nervousness. ?Inability to tolerate heat. ?Unexplained weight loss. ?Diarrhea. ?Change in the texture of hair or skin. ?Heart skipping beats or making extra beats. ?Rapid heart rate. ?Loss of menstruation. ?Shaky hands. ?Fatigue. ?Restlessness. ?Sleep problems. ?Enlarged thyroid gland or a lump in the thyroid (nodule). ?You may also have symptoms of Graves' disease, which may include: ?Protruding eyes. ?Dry eyes. ?Red or swollen eyes. ?Problems with vision. ?How is this diagnosed? ?This condition may be diagnosed based on: ?Your symptoms and  medical history. ?A physical exam. ?Blood tests. ?Thyroid ultrasound. This test involves using sound waves to produce images of the thyroid gland. ?A thyroid scan. A radioactive substance is injected into a vein, and images show how much iodine is present in the thyroid. ?Radioactive iodine uptake test (RAIU). A small amount of radioactive iodine is given by mouth to see how much iodine the thyroid absorbs after a certain amount of time. ?How is this treated? ?Treatment depends on the cause and severity of the condition. Treatment may include: ?Medicines to reduce the amount of thyroid hormone your body makes. ?Radioactive iodine treatment (radioiodine therapy). This involves swallowing a small dose of radioactive iodine, in capsule or liquid form, to kill thyroid cells. ?Surgery to remove part or all of your thyroid gland. You may need to take thyroid hormone replacement medicine for the rest of your life after thyroid surgery. ?Medicines to help manage your symptoms. ?Follow these instructions at home: ? ?Take over-the-counter and prescription medicines only as told by your health care provider. ?Do not use any products that contain nicotine or tobacco, such as cigarettes and e-cigarettes. If you need help quitting, ask your health care provider. ?Follow any instructions from your health care provider about diet. You may be instructed to limit foods that contain iodine. ?Keep all follow-up visits as told by your health care provider. This is important. ?You will need to have blood tests regularly so that your health care provider can monitor your condition. ?Contact a health care provider if: ?Your symptoms do not get better with treatment. ?You have a fever. ?You are taking thyroid hormone replacement medicine and you: ?Have symptoms of depression. ?Feel like you are tired all the time. ?Gain weight. ?Get help right  away if: ?You have chest pain. ?You have decreased alertness or a change in your awareness. ?You  have abdominal pain. ?You feel dizzy. ?You have a rapid heartbeat. ?You have an irregular heartbeat. ?You have difficulty breathing. ?Summary ?The thyroid gland is a small gland located in the lower front part of the neck, just in front of the windpipe (trachea). ?Hyperthyroidism is when the thyroid gland is too active (overactive) and produces too much of a hormone called thyroxine. ?The most common cause is Graves' disease, a disorder in which your immune system attacks the thyroid gland. ?Hyperthyroidism can cause various symptoms, such as unexplained weight loss, nervousness, inability to tolerate heat, or changes in your heartbeat. ?Treatment may include medicine to reduce the amount of thyroid hormone your body makes, radioiodine therapy, surgery, or medicines to manage symptoms. ?This information is not intended to replace advice given to you by your health care provider. Make sure you discuss any questions you have with your health care provider. ?Document Revised: 09/08/2019 Document Reviewed: 09/08/2019 ?Elsevier Patient Education ? [employer] [place]. ? ?

## 2021-03-26 NOTE — Progress Notes (Signed)
? ? ? 03/26/2021   ? ? ?Endocrinology Consult Note  ? ? ?Subjective:  ? ? Patient ID: [name], female    DOB: December 01, 1955, PCP [name], MD. ? ? ?Past Medical History:  ?Diagnosis Date  ? Allergy   ? Anxiety   ? Arthritis   ? Chronic kidney disease   ? kidney donor, has only one kidney  ? Hypertension   ? 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 Migraines   ? Stroke ([place]) 09/19/2019  ? Left side & vision  ? Thyroid disease   ? Hx of   ? ? ?Past Surgical History:  ?Procedure Laterality Date  ? ABDOMINAL HYSTERECTOMY  1991  ? partial  ? BLADDER SURGERY    ? bladder tac, then bladder tack reversal due to adhesions.  ? DORSAL COMPARTMENT RELEASE Right 02/07/2013  ? Procedure: RIGHT WRIST DEQUERVAIN RELEASE;  Surgeon: Jolyn Nap, MD;  Location: [facility];  Service: Orthopedics;  Laterality: Right;  ? KIDNEY DONATION  2008  ? gave her lt kidney to daughter  ? NECK MASS EXCISION    ? rt-mass  ? NECK SURGERY  1990  ? rt ? parotidectomy  ? SHOULDER ARTHROSCOPY WITH OPEN ROTATOR CUFF REPAIR AND DISTAL CLAVICLE ACROMINECTOMY Right 04/25/2014  ? Procedure: SHOULDER ARTHROSCOPY WITH MINI-OPEN ROTATOR CUFF REPAIR AND DISTAL CLAVICLE RESECTION;  Surgeon: Garald Balding, MD;  Location: [facility];  Service: Orthopedics;  Laterality: Right;  ? SHOULDER CLOSED REDUCTION Right 08/31/2014  ? Procedure: CLOSED MANIPULATION SHOULDER;  Surgeon: Garald Balding, MD;  Location: [facility];  Service: Orthopedics;  Laterality: Right;  ? SUBACROMIAL DECOMPRESSION Right 04/25/2014  ? Procedure: SUBACROMIAL DECOMPRESSION;  Surgeon: Garald Balding, MD;  Location: [facility];  Service: Orthopedics;  Laterality: Right;  ? THYROID SURGERY    ? bx  ? ? ?Social History  ? ?Socioeconomic History  ? Marital status: Single  ?  Spouse name: Not on file  ? Number of children: 5  ? Years of education: Not on file  ? Highest education level: Not on file   ?Occupational History  ? Occupation: Disabled  ?  Employer: [employer]  ?  Comment: assembly line  ?Tobacco Use  ? Smoking status: Former  ?  Packs/day: 0.25  ?  Years: 54.00  ?  Pack years: 13.50  ?  Types: Cigarettes, E-cigarettes  ?  Quit date: 10/25/2014  ?  Years since quitting: 6.4  ? Smokeless tobacco: Never  ?Vaping Use  ? Vaping Use: Every day  ?Substance and Sexual Activity  ? Alcohol use: No  ? Drug use: No  ? Sexual activity: Not on file  ?Other Topics Concern  ? Not on file  ?Social History Narrative  ? Pt donated kidney to daughter 5 yrs ago.  ? Lives with son and his family  ? Right Handed   ? Drinks 3-4 cups caffeine daily  ?   ? ?Social Determinants of Health  ? ?Financial Resource Strain: Not on file  ?Food Insecurity: Not on file  ?Transportation Needs: Not on file  ?Physical Activity: Not on file  ?Stress: Not on file  ?Social Connections: Not on file  ? ? ?Family History  ?Problem Relation Age of Onset  ? Arthritis Mother   ?     rheumatoid  ? Hypertension Mother   ? Heart disease Mother   ?  Irregular heart beat   ? Arrhythmia Mother   ? Rheum arthritis Mother   ? Pulmonary fibrosis Mother   ? Osteoporosis Mother   ? Cancer Father   ?     Lung  ? COPD Father   ? Hypertension Sister   ? Hypertension Sister   ? Aneurysm Brother 11  ?     brain  ? Heart disease Maternal Aunt   ? Pulmonary fibrosis Maternal Aunt   ? Arthritis Maternal Uncle   ?     rheumatoid  ? Pulmonary fibrosis Maternal Uncle   ? Diabetes Maternal Uncle   ? Heart disease Maternal Grandmother   ? Colon cancer Neg Hx   ? Colon polyps Neg Hx   ? ? ?Outpatient Encounter Medications as of 03/26/2021  ?Medication Sig  ? acetaminophen (TYLENOL) 325 MG tablet Take 2 tablets (650 mg total) by mouth every 4 (four) hours as needed for mild pain (or temp > 37.5 C (99.5 F)).  ? amLODipine (NORVASC) 5 MG tablet Take 5 mg by mouth daily.   ? aspirin EC 81 MG EC tablet Take 1 tablet (81 mg total) by mouth daily. Swallow whole.  ? atorvastatin  (LIPITOR) 40 MG tablet Take 40 mg by mouth every other day.  ? fexofenadine (ALLEGRA) 180 MG tablet Take 180 mg by mouth daily as needed for allergies.   ? fluticasone (FLONASE) 50 MCG/ACT nasal spray Place 2 sprays into both nostrils daily. (Patient taking differently: Place 2 sprays into both nostrils as needed.)  ? SUMAtriptan (IMITREX) 25 MG tablet Take 1 tablet (25 mg total) by mouth every 2 (two) hours as needed for up to 2 doses for migraine or headache. May repeat in 2 hours if headache persists or recurs.  ? [DISCONTINUED] Baclofen 5 MG TABS Take 5 mg by mouth 2 (two) times daily.  ? [DISCONTINUED] meclizine (ANTIVERT) 25 MG tablet Take 25 mg by mouth 3 (three) times daily as needed for dizziness. (Patient not taking: Reported on 03/26/2021)  ? ?No facility-administered encounter medications on file as of 03/26/2021.  ? ? ?ALLERGIES: ?Allergies  ?Allergen Reactions  ? Celebrex [Celecoxib] Anaphylaxis and Hives  ? Sulfonamide Derivatives Anaphylaxis, Swelling and Rash  ? Ace Inhibitors Cough  ? Dilaudid [Hydromorphone] Nausea And Vomiting  ?  Severe N/V  ? Erythromycin Nausea And Vomiting  ? Lactose Intolerance (Gi) Diarrhea  ?  cramping  ? Sudafed [Pseudoephedrine Hcl]   ?  Makes "high"  ? Losartan Palpitations  ? Mucinex [Guaifenesin Er] Palpitations  ? ? ?VACCINATION STATUS: ?Immunization History  ?Administered Date(s) Administered  ? Influenza Whole 10/15/2006  ? Influenza,inj,Quad PF,6+ Mos 09/19/2013, 10/02/2014  ? Pneumococcal Conjugate-13 09/19/2013  ? Pneumococcal-Unspecified 01/07/2008  ? Tdap 01/07/2008  ? ? ? ?HPI ? ?[name] is 66 y.o. female who presents today with a medical history as above. she is being seen in consultation for hyperthyroidism requested by [name], MD.  she has been dealing with symptoms of cough, fatigue, tremors, hoarseness, dizziness, and unintentional weight loss intermittently since she had her stroke in 2021. These symptoms are progressively worsening and  troubling to her.  her most recent thyroid labs revealed suppressed TSH of 0.031 on 02/11/21 (corresponding Free T4 level was 1.59). ? ?During her chart review, it is noted that she saw Endocrinology back in 2015, had thyroid ultrasound which showed multiple small thyroid nodules, none of which were suspicious for cancer but it also showed hypervascularity recommending uptake and scan if  clinically relevant.  It appears she also had a thyroid biopsy in the past, although the specifics surrounding this aren't available.  Her TPO antibodies were positive at that time as well, indicating high likelihood of autoimmune dysfunction. ? ?she denies dysphagia, choking.  She does note voice change and gets SOB with exertion.  ?  ?she does have family history of thyroid dysfunction in her sister and one of her nephews, but denies family hx of thyroid cancer.  she is not on any anti-thyroid medications nor on any thyroid hormone supplements. Denies use of Biotin containing supplements.  she is willing to proceed with appropriate work up and therapy for thyrotoxicosis. ? ? ?Review of systems ? ?Constitutional: + Minimally fluctuating body weight, current Body mass index is 21.27 kg/m?., + fatigue, no subjective hyperthermia, no subjective hypothermia ?Eyes: + blurry vision, no xerophthalmia ?ENT: no sore throat, no nodules palpated in throat, no dysphagia/odynophagia, + hoarseness ?Cardiovascular: no chest pain, intermittent shortness of breath-mostly with exertion, no palpitations, no leg swelling ?Respiratory: no cough, no shortness of breath ?Gastrointestinal: no nausea/vomiting/diarrhea ?Musculoskeletal: no muscle/joint aches ?Skin: no rashes, no hyperemia ?Neurological: no tremors, no numbness, no tingling, + dizziness ?Psychiatric: no depression, no anxiety ? ? ?Objective:  ?  ?BP 117/76   Pulse 73   Ht 5' 6.25" (1.683 m)   Wt 132 lb 12.8 oz (60.2 kg)   SpO2 98%   BMI 21.27 kg/m?   ?Wt Readings from Last 3 Encounters:   ?03/26/21 132 lb 12.8 oz (60.2 kg)  ?10/04/20 130 lb (59 kg)  ?09/13/20 128 lb (58.1 kg)  ?  ? ?BP Readings from Last 3 Encounters:  ?03/26/21 117/76  ?10/04/20 116/72  ?09/13/20 131/82  ? ?                   ? ?P

## 2021-03-27 ENCOUNTER — Telehealth: Payer: Self-pay

## 2021-03-27 LAB — T4, FREE: Free T4: 0.87 ng/dL (ref 0.82–1.77)

## 2021-03-27 LAB — TSH: TSH: 8.88 u[IU]/mL — ABNORMAL HIGH (ref 0.450–4.500)

## 2021-03-27 LAB — T3, FREE: T3, Free: 2.9 pg/mL (ref 2.0–4.4)

## 2021-03-27 NOTE — Progress Notes (Signed)
Please call patient and let her know that her repeat thyroid labs are suggestive that her thyroid is underactive instead of overactive as discussed at the appointment.  She does NOT need the uptake and scan.  It is likely she had acute inflammation of the thyroid causing her low TSH reading which is now in a period of recovery.  I will order a thyroid ultrasound to follow up on several nodules she had in the past noted on her [place] in 2015.

## 2021-03-27 NOTE — Telephone Encounter (Signed)
Pt notified of results and notified that central scheduling will be calling her to get the ultrasound scheduled. ?

## 2021-03-27 NOTE — Telephone Encounter (Signed)
-----   Message from Brita Romp, NP sent at 03/27/2021  8:19 AM EDT ----- ?Please call patient and let her know that her repeat thyroid labs are suggestive that her thyroid is underactive instead of overactive as discussed at the appointment.  She does NOT need the uptake and scan.  It is likely she had acute inflammation o ?f the thyroid causing her low TSH reading which is now in a period of recovery.  I will order a thyroid ultrasound to follow up on several nodules she had in the past noted on her [place] in 2015. ?

## 2021-03-27 NOTE — Addendum Note (Signed)
Addended by: Brita Romp on: 03/27/2021 08:19 AM ? ? Modules accepted: Orders ? ?

## 2021-04-03 NOTE — Progress Notes (Signed)
?[facility] ?[address] ?[place]. [place] [address] ?[phone] ? ?     OFFICE FOLLOW UP VISIT NOTE ? ?[name] ?Date of Birth:  1955-01-07 ?Medical Record Number:  [id number]  ? ? ?Primary neurologist: Dr. Leonie Man ?Referring MD: Allyn Kenner ?Reason for Referral: Stroke ? ?Chief Complaint  ?Patient presents with  ? Follow-up  ?  RM 3 alone  ?Pt is well and stable, still having L sided extremity and facial numbness/tinging   ?  ? ? ?HPI:  ? ?Initial visit 10/26/2019 Dr. Leonie Man: [name] is a pleasant 67 year old Caucasian lady seen today for initial office consultation visit for stroke.  She has past medical history of hypertension, chronic kidney disease and anxiety.  She presented to [facility] with sudden onset of dizziness, headache and fell when she got out of bed.  She had to call EMS as she was staggering and unable to walk.  She was admitted to [facility] where MRI scan showed a right posterior cerebral artery infarct and CT angio showed right PCA occlusion in the P2 segment.  CT perfusion scan did not show significant penumbra to consider mechanical thrombectomy.  Echocardiogram showed normal ejection fraction without cardiac source of embolism.  LDL cholesterol 153 mg percent.  Hemoglobin A1c was 5.3.  Urine drug screen was negative.  Patient was transferred for rehabilitation to skilled nursing facility and is currently living at home.  She states that her peripheral vision loss is improved her gait and balance are also much better.  She has recently finished home physical and occupational therapy.  She uses a cane while walking long distances only but can walk indoors without it.  She has had no recent falls or has not bumped into any objects.  She does complain of decreased short-term memory since her stroke as well as residual numbness in the left hand and leg.  Occasionally she drags her left leg.  She is too scared to drive.  She is  tolerating aspirin without bruising or bleeding and Lipitor without muscle aches and pains.  Her blood pressure is well controlled today it is 125/84. ?Update 01/31/2020 Dr. Leonie Man: She returns for follow-up after last visit 3 months ago.  She states she has had no further episodes of stroke or TIA.  She has had no migraine headaches either.  She continues to have some dizziness and gait imbalance and drags her left leg but she has had no falls or injuries.  She remains on aspirin 81 mg she is tolerating well without bruising or bleeding.  Blood pressure is usually well controlled today it is borderline at 141/90 in office.  She is tolerating Lipitor well without muscle aches and pains.  She is planning to get lab work done by her primary care physician at next visit with him in April.  She has not been driving as she is scared to drive.  She did undergo 30-day cardiac heart monitor which showed sinus rhythm without significant arrhythmias.  She has no new complaints ? ?Update 10/04/2020 JM: Returns for stroke follow-up after prior visit with Dr. Leonie Man 8 months ago.  She has multiple complaints today including easily fatigued, left arm shaking, left-sided weakness and numbness as well as facial numbness.  All presents since her stroke in 09/2019. Denies new stroke/TIA symptoms. She was seen by Dr. April Manson on 09/13/2020 for left arm shaking and left-sided numbness which has been present since her stroke.  Evaluated by PT  7/5 for left shoulder pain with adhesive capsulitis but has not scheduled any follow-up visit since initial evaluation due to transportation issues.  ? ?She is overly frustrated due to continued deficits and left arm shaking that interferes with daily activity and functioning.  She continues to do exercises at home as well as at her local gym. She does have appt today for acupuncture to see if this will help.  Left hand feels like sandpaper.  Left foot feels tight when its in a shoe.  Denies any type of  nerve pain such as pins/needles, burning or electrical shock sensation.  Does have left shoulder pain which can be worse in the morning.   ? ?Compliant on aspirin and atorvastatin without side effects.  Blood pressure today 116/72.  Routinely monitors at home which has been stable. ? ? ?Update 04/03/2021 JM: Patient returns for 30-monthstroke follow-up unaccompanied.  She has been stable since prior visit without new stroke/TIA symptoms.  Reports continued left sided pin/needle sensation and shaking. Tried baclofen but causes diarrhea. She has used compression devices with some benefit. She is not interested in any other medications. Compliant on aspirin and atorvastatin, denies side effects.  Blood pressure today 128/82.  Routinely monitors at home which has been stable.  Closely follows with PCP.  No further concerns at this time. ? ? ? ? ?ROS:   ?14 system review of systems is positive for those listed in HPI and all other systems negative ? ?PMH:  ?Past Medical History:  ?Diagnosis Date  ? Allergy   ? Anxiety   ? Arthritis   ? Chronic kidney disease   ? kidney donor, has only one kidney  ? Hypertension   ? 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 Migraines   ? [place]  ? Left side & vision  ? Thyroid disease   ? Hx of   ? ? ?Social History:  ?Social History  ? ?Socioeconomic History  ? Marital status: Single  ?  Spouse name: Not on file  ? Number of children: 5  ? Years of education: Not on file  ? Highest education level: Not on file  ?Occupational History  ? Occupation: Disabled  ?  Employer: [employer]  ?  Comment: assembly line  ?Tobacco Use  ? Smoking status: Former  ?  Packs/day: 0.25  ?  Years: 54.00  ?  Pack years: 13.50  ?  Types: Cigarettes, E-cigarettes  ?  Quit date: 10/25/2014  ?  Years since quitting: 6.4  ? Smokeless tobacco: Never  ?Vaping Use  ? Vaping Use: Every day  ?Substance and Sexual Activity  ? Alcohol use: No  ? Drug use: No  ? Sexual activity: Not on file   ?Other Topics Concern  ? Not on file  ?Social History Narrative  ? Pt donated kidney to daughter 5 yrs ago.  ? Lives with son and his family  ? Right Handed   ? Drinks 3-4 cups caffeine daily  ?   ? ?Social Determinants of Health  ? ?Financial Resource Strain: Not on file  ?Food Insecurity: Not on file  ?Transportation Needs: Not on file  ?Physical Activity: Not on file  ?Stress: Not on file  ?Social Connections: Not on file  ?Intimate Partner Violence: Not on file  ? ? ?Medications:   ?Current Outpatient Medications on File Prior to Visit  ?Medication Sig Dispense Refill  ? acetaminophen (TYLENOL) 325 MG tablet Take 2 tablets (  650 mg total) by mouth every 4 (four) hours as needed for mild pain (or temp > 37.5 C (99.5 F)).    ? amLODipine (NORVASC) 5 MG tablet Take 5 mg by mouth daily.     ? aspirin EC 81 MG EC tablet Take 1 tablet (81 mg total) by mouth daily. Swallow whole. 30 tablet 11  ? atorvastatin (LIPITOR) 40 MG tablet Take 40 mg by mouth every other day.    ? fexofenadine (ALLEGRA) 180 MG tablet Take 180 mg by mouth daily as needed for allergies.     ? fluticasone (FLONASE) 50 MCG/ACT nasal spray Place 2 sprays into both nostrils daily. (Patient taking differently: Place 2 sprays into both nostrils as needed.)  2  ? SUMAtriptan (IMITREX) 25 MG tablet Take 1 tablet (25 mg total) by mouth every 2 (two) hours as needed for up to 2 doses for migraine or headache. May repeat in 2 hours if headache persists or recurs. 10 tablet 1  ? ?No current facility-administered medications on file prior to visit.  ? ? ?Allergies:   ?Allergies  ?Allergen Reactions  ? Celebrex [Celecoxib] Anaphylaxis and Hives  ? Sulfonamide Derivatives Anaphylaxis, Swelling and Rash  ? Ace Inhibitors Cough  ? Dilaudid [Hydromorphone] Nausea And Vomiting  ?  Severe N/V  ? Erythromycin Nausea And Vomiting  ? Lactose Intolerance (Gi) Diarrhea  ?  cramping  ? Sudafed [Pseudoephedrine Hcl]   ?  Makes "high"  ? Losartan Palpitations  ? Mucinex  [Guaifenesin Er] Palpitations  ? ? ?Physical Exam ?Today's Vitals  ? 04/04/21 0842  ?BP: 128/82  ?Pulse: 74  ?Weight: 135 lb (61.2 kg)  ?Height: '5\' 7"'$  (1.702 m)  ? ? ?Body mass index is 21.14 kg/m?. ?  ?General: Fra

## 2021-04-04 ENCOUNTER — Telehealth: Payer: Self-pay | Admitting: Adult Health

## 2021-04-04 ENCOUNTER — Encounter: Payer: Self-pay | Admitting: Adult Health

## 2021-04-04 ENCOUNTER — Ambulatory Visit (INDEPENDENT_AMBULATORY_CARE_PROVIDER_SITE_OTHER): Payer: Medicare Other | Admitting: Adult Health

## 2021-04-04 VITALS — BP 128/82 | HR 74 | Ht 67.0 in | Wt 135.0 lb

## 2021-04-04 DIAGNOSIS — I69393 Ataxia following cerebral infarction: Secondary | ICD-10-CM

## 2021-04-04 DIAGNOSIS — I63431 Cerebral infarction due to embolism of right posterior cerebral artery: Secondary | ICD-10-CM | POA: Diagnosis not present

## 2021-04-04 DIAGNOSIS — R200 Anesthesia of skin: Secondary | ICD-10-CM | POA: Diagnosis not present

## 2021-04-04 NOTE — Telephone Encounter (Signed)
Angie is not able to take this patient: she sent me a message stating:  ? ?"sorry- her is message from Clinical manager--we have had a lot for this location recently--sorry! ? ? ?We need to turn this down. We are booked for PT in this area.  " ? ? ?I sent a message to [facility] with  ?[facility]  to see if he would be able to take the patient.  ?

## 2021-04-04 NOTE — Telephone Encounter (Signed)
Sent to Angie with [facility] (formerly [facility]) to see if she is able to take the patient.  ?

## 2021-04-04 NOTE — Patient Instructions (Addendum)
Start home health physical therapy for ongoing improvement  ? ?Continue aspirin 81 mg daily  and atorvastatin '40mg'$  daily  for secondary stroke prevention ? ?Continue to follow up with PCP regarding cholesterol and blood pressure management  ?Maintain strict control of hypertension with blood pressure goal below 130/90 and cholesterol with LDL cholesterol (bad cholesterol) goal below 70 mg/dL.  ? ?Signs of a Stroke? Follow the BEFAST method:  ?Balance Watch for a sudden loss of balance, trouble with coordination or vertigo ?Eyes Is there a sudden loss of vision in one or both eyes? Or double vision?  ?Face: Ask the person to smile. Does one side of the face droop or is it numb?  ?Arms: Ask the person to raise both arms. Does one arm drift downward? Is there weakness or numbness of a leg? ?Speech: Ask the person to repeat a simple phrase. Does the speech sound slurred/strange? Is the person confused ? ?Time: If you observe any of these signs, call 911. ? ? ?Continue to follow with your PCP for continued stroke risk factor management. No further recommendations from stroke standpoint at this time. Please call with any questions or concerns.   ? ? ? ? ?Thank you for coming to see [place] at [facility]. I hope we have been able to provide you high quality care today. ? ?You may receive a patient satisfaction survey over the next few weeks. We would appreciate your feedback and comments so that we may continue to improve ourselves and the health of our patients. ? ? ? ? ?Ataxia ?Ataxia is a condition that causes unsteadiness when walking and standing, poor coordination of body movements, and difficulty keeping a straight (upright) posture. A problem with the part of the brain that controls coordination and stability (cerebellum) can cause this condition. ?Ataxia can develop later in life (acquired ataxia), during your 20s or 30s, or into your 60s or later. This type of ataxia develops when another medical  condition, such as a stroke, damages the cerebellum. Ataxia also may be present early in life (non-acquired ataxia). There are two main types of non-acquired ataxia: ?Congenital. This type is present at birth. ?Hereditary. This type is passed from parent to child. The most common form of hereditary non-acquired ataxia is Friedreich ataxia. ?What are the causes? ?Acquired ataxia may be caused by: ?Changes in the nervous system (neurodegenerative changes). ?Changes throughout the body (systemic disorders). ?A lot of exposure to: ?Certain medicines, such as phenytoin and lithium. ?Solvents. These are cleaning fluids, such as paint thinner, nail polish remover, carpet cleaner, and degreasers. ?Alcohol abuse. ?Medical conditions, such as: ?Celiac disease. ?Hypothyroidism. ?A lack (deficiency) of vitamin E, vitamin B12, or thiamine. ?Brain tumors. ?Multiple sclerosis. ?Cerebral palsy. ?Stroke. ?Paraneoplastic syndromes. These are rare disorders triggered by the body's defense system (immune system) in response to a cancerous tumor. ?Viral infections. ?Head injury. ?Malnutrition. ?Congenital and hereditary ataxia are caused by problems that are present in genes before birth. ?What are the signs or symptoms? ?Signs and symptoms of ataxia may vary, depending on the cause. They may include: ?Being unsteady. ?Walking with the legs wide apart to keep one's balance. ?Uncontrolled shaking (tremor). ?Poorly coordinated body movements. ?Difficulty maintaining an upright posture. ?Fatigue. ?Changes in speech, vision, or both. ?Involuntary eye movements. ?Difficulty swallowing. ?Difficulty writing. ?Muscle tightening that you cannot control (muscle spasms). ?How is this diagnosed? ?Ataxia may be diagnosed based on: ?Your personal and family medical history. ?A physical exam. ?Imaging tests, such as a CT scan  or MRI. ?Spinal tap (lumbar puncture). This procedure involves using a needle to take a sample of the fluid around your brain  and spinal cord. ?Genetic testing. ?Blood work. ?How is this treated? ?The underlying condition that causes ataxia needs to be treated. If the cause is a brain tumor, you may need surgery. ?Treatment also focuses on improving your quality of life. This may involve: ?Physical therapy. This helps you to learn ways to improve coordination and move around more carefully. ?Occupational therapy. This helps you to improve your ability to do daily tasks, such as bathing and feeding yourself. ?Using devices to help you move around, eat, or communicate. These are called assistive devices, and they include a walker, modified eating utensils, and communication aids. ?Speech therapy. This helps you to learn ways to improve speech and swallowing. ?Follow these instructions at home: ?Preventing falls ?Lie down right away if you become very unsteady, dizzy, or nauseous, or if you feel like you are going to faint. Do not get up until all of those feelings pass. ?Keep your home well-lit. Use night-lights as needed. ?Remove tripping hazards, such as throw rugs, cords, and clutter. ?Install grab bars by the toilet and in the tub and shower. ?Use assistive devices, such as a cane, walker, or wheelchair as needed to keep your balance. ?General instructions ?Do not drink alcohol. ?Ask your health care provider what activities are safe for you, and what activities you should avoid. ?Take over-the-counter and prescription medicines only as told by your health care provider. ?Contact a health care provider if: ?You have increasing unsteadiness or fall. ?You need equipment to help with walking, such as a cane or walker. ?You have difficulty with swallowing. ?Get help right away if: ?You have unsteadiness that suddenly worsens. ?You have any of these: ?Severe headaches. ?Chest pain. ?Abdominal pain. ?Weakness or numbness on one side of your body. ?Vision problems. ?Difficulty speaking. ?An irregular heartbeat. ?A very fast pulse. ?You feel  confused. ?These symptoms may represent a serious problem that is an emergency. Do not wait to see if the symptoms will go away. Get medical help right away. Call your local emergency services (911 in the [place] Do not drive yourself to the hospital. ?Summary ?Ataxia is a condition that causes unsteadiness when walking and standing, poor coordination of body movements, and difficulty keeping a straight (upright) posture. ?Ataxia occurs because of a problem with the part of the brain that controls coordination and stability (cerebellum). ?The underlying condition that causes your ataxia needs to be treated. ?Treatment also focuses on improving your quality of life. ?This information is not intended to replace advice given to you by your health care provider. Make sure you discuss any questions you have with your health care provider. ?Document Revised: 12/18/2019 Document Reviewed: 12/18/2019 ?Elsevier Patient Education ? [place]. ? ?

## 2021-04-08 NOTE — Telephone Encounter (Signed)
Marjory Lies with [facility] messaged me and informed met hey are able to take this patient.  ?

## 2021-04-09 ENCOUNTER — Ambulatory Visit (HOSPITAL_COMMUNITY)
Admission: RE | Admit: 2021-04-09 | Discharge: 2021-04-09 | Disposition: A | Discharge status: Home / Self Care | Payer: Medicare Other | Source: Ambulatory Visit | Attending: Nurse Practitioner | Admitting: Nurse Practitioner

## 2021-04-09 DIAGNOSIS — E049 Nontoxic goiter, unspecified: Secondary | ICD-10-CM | POA: Diagnosis not present

## 2021-04-09 DIAGNOSIS — E042 Nontoxic multinodular goiter: Secondary | ICD-10-CM | POA: Insufficient documentation

## 2021-04-16 ENCOUNTER — Encounter: Payer: Self-pay | Admitting: Nurse Practitioner

## 2021-04-16 ENCOUNTER — Ambulatory Visit (INDEPENDENT_AMBULATORY_CARE_PROVIDER_SITE_OTHER): Payer: Medicare Other | Admitting: Nurse Practitioner

## 2021-04-16 VITALS — BP 117/82 | HR 78 | Ht 66.25 in | Wt 134.8 lb

## 2021-04-16 DIAGNOSIS — R768 Other specified abnormal immunological findings in serum: Secondary | ICD-10-CM | POA: Diagnosis not present

## 2021-04-16 DIAGNOSIS — R7989 Other specified abnormal findings of blood chemistry: Secondary | ICD-10-CM | POA: Diagnosis not present

## 2021-04-16 DIAGNOSIS — E042 Nontoxic multinodular goiter: Secondary | ICD-10-CM

## 2021-04-16 DIAGNOSIS — R7689 Other specified abnormal immunological findings in serum: Secondary | ICD-10-CM

## 2021-04-16 NOTE — Patient Instructions (Signed)
Thyroid Nodule ?A thyroid nodule is an isolated growth of thyroid cells that forms a lump in your thyroid gland. The thyroid gland is a butterfly-shaped gland. It is found in the lower front of your neck. This gland sends chemical messengers (hormones) through your blood to all parts of your body. These hormones are important in regulating your body temperature and helping your body to use energy. ?Thyroid nodules are common. Most are not cancerous (benign). You may have one nodule or several nodules. ?Different types of thyroid nodules include nodules that: ?Grow and fill with fluid (thyroid cysts). ?Produce too much thyroid hormone (hot nodules or hyperthyroid). ?Produce no thyroid hormone (cold nodules or hypothyroid). ?Form from cancer cells (thyroid cancers). ?What are the causes? ?In most cases, the cause of this condition is not known. ?What increases the risk? ?The following factors may make you more likely to develop this condition. ?Age. Thyroid nodules become more common in people who are older than 67 years of age. ?Gender. ?Benign thyroid nodules are more common in women. ?Cancerous (malignant) thyroid nodules are more common in men. ?A family history that includes: ?Thyroid nodules. ?Pheochromocytoma. ?Thyroid carcinoma. ?Hyperparathyroidism. ?Certain kinds of thyroid diseases, such as Hashimoto's thyroiditis. ?Lack of iodine in your diet. ?A history of head and neck radiation, such as from previous cancer treatment. ?What are the signs or symptoms? ?In many cases, there are no symptoms. If you have symptoms, they may include: ?A lump in your lower neck. ?Feeling a lump or tickle in your throat. ?Pain in your neck, jaw, or ear. ?Having trouble swallowing. ?Hot nodules may cause symptoms that include: ?Weight loss. ?Warm, flushed skin. ?Feeling hot. ?Feeling nervous. ?A racing heartbeat. ?Cold nodules may cause symptoms that include: ?Weight gain. ?Dry skin. ?Brittle hair. This may also occur with hair  loss. ?Feeling cold. ?Fatigue. ?Thyroid cancer nodules may cause symptoms that include: ?Hard nodules that feel stuck to the thyroid gland. ?Hoarseness. ?Lumps in the glands near your thyroid (lymph nodes). ?How is this diagnosed? ?A thyroid nodule may be felt by your health care provider during a physical exam. This condition may also be diagnosed based on your symptoms. You may also have tests, including: ?An ultrasound. This may be done to confirm the diagnosis. ?A biopsy. This involves taking a sample from the nodule and looking at it under a microscope. ?Blood tests to make sure that your thyroid is working properly. ?A thyroid scan. This test uses a radioactive tracer injected into a vein to create an image of the thyroid gland on a computer screen. ?Imaging tests such as MRI or CT scan. These may be done if: ?Your nodule is large. ?Your nodule is blocking your airway. ?Cancer is suspected. ?How is this treated? ?Treatment depends on the cause and size of your nodule or nodules. If the nodule is benign, treatment may not be necessary. Your health care provider may monitor the nodule to see if it goes away without treatment. If the nodule continues to grow, is cancerous, or does not go away, treatment may be needed. Treatment may include: ?Having a cystic nodule drained with a needle. ?Ablation therapy. In this treatment, alcohol is injected into the area of the nodule to destroy the cells. Ablation with heat (thermal ablation) may also be used. ?Radioactive iodine. In this treatment, radioactive iodine is given as a pill or liquid that you drink. This substance causes the thyroid nodule to shrink. ?Surgery to remove the nodule. Part or all of your thyroid gland may  need to be removed as well. ?Medicines. ?Follow these instructions at home: ?Pay attention to any changes in your nodule. ?Take over-the-counter and prescription medicines only as told by your health care provider. ?Keep all follow-up visits as told  by your health care provider. This is important. ?Contact a health care provider if: ?Your voice changes. ?You have trouble swallowing. ?You have pain in your neck, ear, or jaw that is getting worse. ?Your nodule gets bigger. ?Your nodule starts to make it harder for you to breathe. ?Your muscles look like they are shrinking (muscle wasting). ?Get help right away if: ?You have chest pain. ?There is a loss of consciousness. ?You have a sudden fever. ?You feel confused. ?You are seeing or hearing things that other people do not see or hear (having hallucinations). ?You feel very weak. ?You have mood swings. ?You feel very restless. ?You feel suddenly nauseous or throw up. ?You suddenly have diarrhea. ?Summary ?A thyroid nodule is an isolated growth of thyroid cells that forms a lump in your thyroid gland. ?Thyroid nodules are common. Most are not cancerous (benign). You may have one nodule or several nodules. ?Treatment depends on the cause and size of your nodule or nodules. If the nodule is benign, treatment may not be necessary. ?Your health care provider may monitor the nodule to see if it goes away without treatment. If the nodule continues to grow, is cancerous, or does not go away, treatment may be needed. ?This information is not intended to replace advice given to you by your health care provider. Make sure you discuss any questions you have with your health care provider. ?Document Revised: 08/07/2017 Document Reviewed: 08/10/2017 ?Elsevier Patient Education ? [place]. ? ?

## 2021-04-16 NOTE — Progress Notes (Signed)
? ? ? 04/16/2021   ? ? ?Endocrinology Follow Up Note  ? ? ?Subjective:  ? ? Patient ID: [name], female    DOB: 1955/09/09, PCP [name], MD. ? ? ?Past Medical History:  ?Diagnosis Date  ? Allergy   ? Anxiety   ? Arthritis   ? Chronic kidney disease   ? kidney donor, has only one kidney  ? Hypertension   ? 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 Migraines   ? Stroke ([place]) 09/19/2019  ? Left side & vision  ? Thyroid disease   ? Hx of   ? ? ?Past Surgical History:  ?Procedure Laterality Date  ? ABDOMINAL HYSTERECTOMY  1991  ? partial  ? BLADDER SURGERY    ? bladder tac, then bladder tack reversal due to adhesions.  ? DORSAL COMPARTMENT RELEASE Right 02/07/2013  ? Procedure: RIGHT WRIST DEQUERVAIN RELEASE;  Surgeon: Jolyn Nap, MD;  Location: [facility];  Service: Orthopedics;  Laterality: Right;  ? KIDNEY DONATION  2008  ? gave her lt kidney to daughter  ? NECK MASS EXCISION    ? rt-mass  ? NECK SURGERY  1990  ? rt ? parotidectomy  ? SHOULDER ARTHROSCOPY WITH OPEN ROTATOR CUFF REPAIR AND DISTAL CLAVICLE ACROMINECTOMY Right 04/25/2014  ? Procedure: SHOULDER ARTHROSCOPY WITH MINI-OPEN ROTATOR CUFF REPAIR AND DISTAL CLAVICLE RESECTION;  Surgeon: Garald Balding, MD;  Location: [facility];  Service: Orthopedics;  Laterality: Right;  ? SHOULDER CLOSED REDUCTION Right 08/31/2014  ? Procedure: CLOSED MANIPULATION SHOULDER;  Surgeon: Garald Balding, MD;  Location: [place];  Service: Orthopedics;  Laterality: Right;  ? SUBACROMIAL DECOMPRESSION Right 04/25/2014  ? Procedure: SUBACROMIAL DECOMPRESSION;  Surgeon: Garald Balding, MD;  Location: [facility];  Service: Orthopedics;  Laterality: Right;  ? THYROID SURGERY    ? bx  ? ? ?Social History  ? ?Socioeconomic History  ? Marital status: Single  ?  Spouse name: Not on file  ? Number of children: 5  ? Years of education: Not on file  ? Highest education level: Not on file   ?Occupational History  ? Occupation: Disabled  ?  Employer: [employer]  ?  Comment: assembly line  ?Tobacco Use  ? Smoking status: Former  ?  Packs/day: 0.25  ?  Years: 54.00  ?  Pack years: 13.50  ?  Types: Cigarettes, E-cigarettes  ?  Quit date: 10/25/2014  ?  Years since quitting: 6.4  ? Smokeless tobacco: Never  ?Vaping Use  ? Vaping Use: Every day  ?Substance and Sexual Activity  ? Alcohol use: No  ? Drug use: No  ? Sexual activity: Not on file  ?Other Topics Concern  ? Not on file  ?Social History Narrative  ? Pt donated kidney to daughter 5 yrs ago.  ? Lives with son and his family  ? Right Handed   ? Drinks 3-4 cups caffeine daily  ?   ? ?Social Determinants of Health  ? ?Financial Resource Strain: Not on file  ?Food Insecurity: Not on file  ?Transportation Needs: Not on file  ?Physical Activity: Not on file  ?Stress: Not on file  ?Social Connections: Not on file  ? ? ?Family History  ?Problem Relation Age of Onset  ? Arthritis Mother   ?     rheumatoid  ? Hypertension Mother   ? Heart disease Mother   ?  Irregular heart beat   ? Arrhythmia Mother   ? Rheum arthritis Mother   ? Pulmonary fibrosis Mother   ? Osteoporosis Mother   ? Cancer Father   ?     Lung  ? COPD Father   ? Hypertension Sister   ? Hypertension Sister   ? Aneurysm Brother 11  ?     brain  ? Heart disease Maternal Aunt   ? Pulmonary fibrosis Maternal Aunt   ? Arthritis Maternal Uncle   ?     rheumatoid  ? Pulmonary fibrosis Maternal Uncle   ? Diabetes Maternal Uncle   ? Heart disease Maternal Grandmother   ? Colon cancer Neg Hx   ? Colon polyps Neg Hx   ? ? ?Outpatient Encounter Medications as of 04/16/2021  ?Medication Sig  ? acetaminophen (TYLENOL) 325 MG tablet Take 2 tablets (650 mg total) by mouth every 4 (four) hours as needed for mild pain (or temp > 37.5 C (99.5 F)).  ? amLODipine (NORVASC) 5 MG tablet Take 5 mg by mouth daily.   ? aspirin EC 81 MG EC tablet Take 1 tablet (81 mg total) by mouth daily. Swallow whole.  ? atorvastatin  (LIPITOR) 40 MG tablet Take 40 mg by mouth every other day.  ? fexofenadine (ALLEGRA) 180 MG tablet Take 180 mg by mouth daily as needed for allergies.   ? fluticasone (FLONASE) 50 MCG/ACT nasal spray Place 2 sprays into both nostrils daily. (Patient taking differently: Place 2 sprays into both nostrils as needed.)  ? SUMAtriptan (IMITREX) 25 MG tablet Take 1 tablet (25 mg total) by mouth every 2 (two) hours as needed for up to 2 doses for migraine or headache. May repeat in 2 hours if headache persists or recurs.  ? ?No facility-administered encounter medications on file as of 04/16/2021.  ? ? ?ALLERGIES: ?Allergies  ?Allergen Reactions  ? Celebrex [Celecoxib] Anaphylaxis and Hives  ? Sulfonamide Derivatives Anaphylaxis, Swelling and Rash  ? Ace Inhibitors Cough  ? Dilaudid [Hydromorphone] Nausea And Vomiting  ?  Severe N/V  ? Erythromycin Nausea And Vomiting  ? Lactose Intolerance (Gi) Diarrhea  ?  cramping  ? Sudafed [Pseudoephedrine Hcl]   ?  Makes "high"  ? Losartan Palpitations  ? Mucinex [Guaifenesin Er] Palpitations  ? ? ?VACCINATION STATUS: ?Immunization History  ?Administered Date(s) Administered  ? Influenza Whole 10/15/2006  ? Influenza,inj,Quad PF,6+ Mos 09/19/2013, 10/02/2014  ? Pneumococcal Conjugate-13 09/19/2013  ? Pneumococcal-Unspecified 01/07/2008  ? Tdap 01/07/2008  ? ? ? ?HPI ? ?[name] is 66 y.o. female who presents today with a medical history as above. she is being seen in consultation for hyperthyroidism requested by [name], MD.  she has been dealing with symptoms of cough, fatigue, tremors, hoarseness, dizziness, and unintentional weight loss intermittently since she had her stroke in 2021. These symptoms are progressively worsening and troubling to her.  her most recent thyroid labs revealed suppressed TSH of 0.031 on 02/11/21 (corresponding Free T4 level was 1.59). ? ?During her chart review, it is noted that she saw Endocrinology back in 2015, had thyroid ultrasound which  showed multiple small thyroid nodules, none of which were suspicious for cancer but it also showed hypervascularity recommending uptake and scan if clinically relevant.  It appears she also had a thyroid biopsy in the past, although the specifics surrounding this aren't available.  Her TPO antibodies were positive at that time as well, indicating high likelihood of autoimmune dysfunction. ? ?she denies dysphagia, choking.  She does note voice change and gets SOB with exertion.  ?  ?she does have family history of thyroid dysfunction in her sister and one of her nephews, but denies family hx of thyroid cancer.  she is not on any anti-thyroid medications nor on any thyroid hormone supplements. Denies use of Biotin containing supplements.  she is willing to proceed with appropriate work up and therapy for thyrotoxicosis. ? ? ?Review of systems ? ?Constitutional: + Minimally fluctuating body weight, current Body mass index is 21.59 kg/m?., + fatigue, no subjective hyperthermia, no subjective hypothermia ?Eyes: + blurry vision, no xerophthalmia ?ENT: + sore throat-says she thinks she has a cold, no nodules palpated in throat, no dysphagia/odynophagia, + hoarseness ?Cardiovascular: no chest pain, intermittent shortness of breath-mostly with exertion, no palpitations, no leg swelling ?Respiratory: + nighttime cough, + intermittent shortness of breath-usually with exertion ?Gastrointestinal: no nausea/vomiting/diarrhea ?Musculoskeletal: no muscle/joint aches ?Skin: no rashes, no hyperemia ?Neurological: no tremors, no numbness, no tingling ?Psychiatric: no depression, no anxiety ? ? ?Objective:  ?  ?BP 117/82   Pulse 78   Ht 5' 6.25" (1.683 m)   Wt 134 lb 12.8 oz (61.1 kg)   SpO2 98%   BMI 21.59 kg/m?   ?Wt Readings from Last 3 Encounters:  ?04/16/21 134 lb 12.8 oz (61.1 kg)  ?04/04/21 135 lb (61.2 kg)  ?03/26/21 132 lb 12.8 oz (60.2 kg)  ?  ? ?BP Readings from Last 3 Encounters:  ?04/16/21 117/82  ?04/04/21 128/82   ?03/26/21 117/76  ? ?                   ? ?Physical Exam- Limited ? ?Constitutional:  Body mass index is 21.59 kg/m?. , not in acute distress, normal state of mind ?Eyes:  EOMI, no exophthalmos ?Neck: Supple ?Thy

## 2021-04-18 ENCOUNTER — Telehealth: Payer: Self-pay

## 2021-04-18 DIAGNOSIS — L942 Calcinosis cutis: Secondary | ICD-10-CM | POA: Diagnosis not present

## 2021-04-18 DIAGNOSIS — E079 Disorder of thyroid, unspecified: Secondary | ICD-10-CM | POA: Diagnosis not present

## 2021-04-18 DIAGNOSIS — M7502 Adhesive capsulitis of left shoulder: Secondary | ICD-10-CM | POA: Diagnosis not present

## 2021-04-18 DIAGNOSIS — E061 Subacute thyroiditis: Secondary | ICD-10-CM | POA: Diagnosis not present

## 2021-04-18 DIAGNOSIS — I69393 Ataxia following cerebral infarction: Secondary | ICD-10-CM | POA: Diagnosis not present

## 2021-04-18 DIAGNOSIS — Z7982 Long term (current) use of aspirin: Secondary | ICD-10-CM | POA: Diagnosis not present

## 2021-04-18 DIAGNOSIS — G43909 Migraine, unspecified, not intractable, without status migrainosus: Secondary | ICD-10-CM | POA: Diagnosis not present

## 2021-04-18 DIAGNOSIS — H547 Unspecified visual loss: Secondary | ICD-10-CM | POA: Diagnosis not present

## 2021-04-18 DIAGNOSIS — Z87891 Personal history of nicotine dependence: Secondary | ICD-10-CM | POA: Diagnosis not present

## 2021-04-18 DIAGNOSIS — E041 Nontoxic single thyroid nodule: Secondary | ICD-10-CM | POA: Diagnosis not present

## 2021-04-18 DIAGNOSIS — K5900 Constipation, unspecified: Secondary | ICD-10-CM | POA: Diagnosis not present

## 2021-04-18 DIAGNOSIS — M7541 Impingement syndrome of right shoulder: Secondary | ICD-10-CM | POA: Diagnosis not present

## 2021-04-18 DIAGNOSIS — I69398 Other sequelae of cerebral infarction: Secondary | ICD-10-CM | POA: Diagnosis not present

## 2021-04-18 DIAGNOSIS — N189 Chronic kidney disease, unspecified: Secondary | ICD-10-CM | POA: Diagnosis not present

## 2021-04-18 DIAGNOSIS — L409 Psoriasis, unspecified: Secondary | ICD-10-CM | POA: Diagnosis not present

## 2021-04-18 DIAGNOSIS — M1811 Unilateral primary osteoarthritis of first carpometacarpal joint, right hand: Secondary | ICD-10-CM | POA: Diagnosis not present

## 2021-04-18 DIAGNOSIS — I69354 Hemiplegia and hemiparesis following cerebral infarction affecting left non-dominant side: Secondary | ICD-10-CM | POA: Diagnosis not present

## 2021-04-18 DIAGNOSIS — I129 Hypertensive chronic kidney disease with stage 1 through stage 4 chronic kidney disease, or unspecified chronic kidney disease: Secondary | ICD-10-CM | POA: Diagnosis not present

## 2021-04-18 DIAGNOSIS — Z905 Acquired absence of kidney: Secondary | ICD-10-CM | POA: Diagnosis not present

## 2021-04-18 DIAGNOSIS — M19019 Primary osteoarthritis, unspecified shoulder: Secondary | ICD-10-CM | POA: Diagnosis not present

## 2021-04-18 DIAGNOSIS — J449 Chronic obstructive pulmonary disease, unspecified: Secondary | ICD-10-CM | POA: Diagnosis not present

## 2021-04-18 NOTE — Telephone Encounter (Signed)
We received a phone call from Araceli Bouche who is looking for verbal orders for home health PT for this patient. He can be reached at [phone]. ?

## 2021-04-18 NOTE — Telephone Encounter (Signed)
Contacted [name] back to ask for details of the order. LVM rq call back  ?

## 2021-04-18 NOTE — Telephone Encounter (Signed)
Contacted Gordon back again, gave VO for 1-1, 2-4, 1-4.  ?

## 2021-04-19 ENCOUNTER — Ambulatory Visit
Admission: EM | Admit: 2021-04-19 | Discharge: 2021-04-19 | Disposition: A | Discharge status: Home / Self Care | Payer: Medicare Other | Attending: Family Medicine | Admitting: Family Medicine

## 2021-04-19 DIAGNOSIS — J069 Acute upper respiratory infection, unspecified: Secondary | ICD-10-CM | POA: Diagnosis not present

## 2021-04-19 LAB — POCT RAPID STREP A (OFFICE): Rapid Strep A Screen: NEGATIVE

## 2021-04-19 MED ORDER — AZITHROMYCIN 250 MG PO TABS: ORAL_TABLET | ORAL | 0 refills | Status: DC

## 2021-04-19 MED ORDER — BENZONATATE 200 MG PO CAPS: 200.0000 mg | ORAL_CAPSULE | Freq: Three times a day (TID) | ORAL | 0 refills | Status: DC | PRN

## 2021-04-19 NOTE — ED Triage Notes (Signed)
Patient presents to Urgent Care with complaints of sore throat and loss of voice x 1 week. She states her grandchildren were dx with upper respiratory infection. Treating symptoms with cough drops, delsium, and tylenol.  ?

## 2021-04-19 NOTE — ED Provider Notes (Signed)
[name]    CSN: [id number] Arrival date & time: 04/19/21  1217      History   Chief Complaint Chief Complaint  Patient presents with   Sore Throat    HPI [name] is a 66 y.o. female.   HPI Patient present for evaluation of sore throat, nasal congestion, cough, body aches, facial pressure, and runny nose. She has had chills and treated with tylenol and ibuprofen. She endorses poor appetite and has been hydrating with fluids. No known sick contacts or exposure to COVID. Her grandchildren both were sick and treated for URI symptoms.   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 08/17/2018   Constipation 07/08/2018   Abdominal pain, left upper quadrant 07/08/2018   Pain in right wrist 05/13/2018   Chronic kidney disease 11/06/2015   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   Cough variant not due to asthma 05/16/2014   Essential hypertension, benign 05/16/2014   Rotator cuff impingement syndrome of right shoulder 04/25/2014   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   Thyroiditis, subacute 12/22/2013   Right thyroid nodule 12/22/2013   Irritability 12/08/2013   Arthritis 12/08/2013   Joint pain 10/03/2013   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   DORSAL COMPARTMENT RELEASE Right 02/07/2013   Procedure: RIGHT WRIST DEQUERVAIN RELEASE;  Surgeon: Jodi Marble, MD;  Location: [facility];  Service: Orthopedics;  Laterality: Right;   KIDNEY DONATION  2008   gave her lt kidney to daughter   NECK MASS EXCISION     rt-mass   NECK SURGERY  1990   rt ? parotidectomy   SHOULDER ARTHROSCOPY WITH OPEN ROTATOR CUFF REPAIR AND DISTAL CLAVICLE ACROMINECTOMY Right 04/25/2014   Procedure: SHOULDER ARTHROSCOPY WITH MINI-OPEN ROTATOR CUFF REPAIR AND DISTAL CLAVICLE RESECTION;  Surgeon: Valeria Batman, MD;  Location: [facility];  Service: Orthopedics;  Laterality: Right;   SHOULDER CLOSED REDUCTION Right 08/31/2014   Procedure: CLOSED MANIPULATION SHOULDER;  Surgeon: Valeria Batman, MD;  Location: [facility];  Service: Orthopedics;  Laterality: Right;   SUBACROMIAL DECOMPRESSION Right 04/25/2014   Procedure: SUBACROMIAL DECOMPRESSION;  Surgeon: Valeria Batman, MD;  Location: [facility];  Service: Orthopedics;  Laterality: Right;   THYROID SURGERY     bx    OB History   No obstetric history on file.      Home Medications    Prior to Admission medications   Medication Sig Start Date End Date Taking? Authorizing Provider  azithromycin (ZITHROMAX) 250 MG tablet  Take 2 tabs PO x 1 dose, then 1 tab PO QD x 4 days 04/19/21  Yes Bing Neighbors, FNP  benzonatate (TESSALON) 200 MG capsule Take 1 capsule (200 mg total) by mouth 3 (three) times daily as needed for cough. 04/19/21  Yes Bing Neighbors, FNP  acetaminophen (TYLENOL) 325 MG tablet Take 2 tablets (650 mg total) by mouth every 4 (four) hours as needed for mild pain (or temp > 37.5 C (99.5 F)). 09/21/19   Johnson, Clanford L, MD  amLODipine (NORVASC) 5 MG tablet Take 5 mg by mouth daily.  04/15/17   [provider]  aspirin EC 81 MG EC tablet Take 1 tablet  (81 mg total) by mouth daily. Swallow whole. 09/22/19   Johnson, Clanford L, MD  atorvastatin (LIPITOR) 40 MG tablet Take 40 mg by mouth every other day.    [provider]  fexofenadine (ALLEGRA) 180 MG tablet Take 180 mg by mouth daily as needed for allergies.     [provider]  fluticasone (FLONASE) 50 MCG/ACT nasal spray Place 2 sprays into both nostrils daily. Patient taking differently: Place 2 sprays into both nostrils as needed. 09/22/19   Johnson, Clanford L, MD  SUMAtriptan (IMITREX) 25 MG tablet Take 1 tablet (25 mg total) by mouth every 2 (two) hours as needed for up to 2 doses for migraine or headache. May repeat in 2 hours if headache persists or recurs. 09/23/19   Cleora Fleet, MD    Family History Family History  Problem Relation Age of Onset   Arthritis Mother        rheumatoid   Hypertension Mother    Heart disease Mother        Irregular heart beat    Arrhythmia Mother    Rheum arthritis Mother    Pulmonary fibrosis Mother    Osteoporosis Mother    Cancer Father        Lung   COPD Father    Hypertension Sister    Hypertension Sister    Aneurysm Brother 11       brain   Heart disease Maternal Aunt    Pulmonary fibrosis Maternal Aunt    Arthritis Maternal Uncle        rheumatoid   Pulmonary fibrosis Maternal Uncle    Diabetes Maternal Uncle    Heart disease Maternal Grandmother    Colon cancer Neg Hx    Colon polyps Neg Hx     Social History Social History   Tobacco Use   Smoking status: Former    Packs/day: 0.25    Years: 54.00    Pack years: 13.50    Types: Cigarettes, E-cigarettes    Quit date: 10/25/2014    Years since quitting: 6.4   Smokeless tobacco: Never  Vaping Use   Vaping Use: Every day  Substance Use Topics   Alcohol use: No   Drug use: No     Allergies   Celebrex [celecoxib], Sulfonamide derivatives, Ace inhibitors, Dilaudid [hydromorphone], Erythromycin, Lactose intolerance (gi), Sudafed  [pseudoephedrine hcl], Losartan, and Mucinex [guaifenesin er]   Review of Systems Review of Systems Pertinent negatives listed in HPI   Physical Exam Triage Vital Signs ED Triage Vitals  Enc Vitals Group     BP 04/19/21 1248 108/74     Pulse Rate 04/19/21 1248 88     Resp 04/19/21 1248 16     Temp 04/19/21 1248 97.9 F (36.6 C)     Temp Source 04/19/21 1248  Temporal     SpO2 04/19/21 1248 95 %     Weight --      Height --      Head Circumference --      Peak Flow --      Pain Score 04/19/21 1318 0     Pain Loc --      Pain Edu? --      Excl. in GC? --    No data found.  Updated Vital Signs BP 108/74 (BP Location: Left Arm)   Pulse 88   Temp 97.9 F (36.6 C) (Temporal)   Resp 16   SpO2 95%   Visual Acuity Right Eye Distance:   Left Eye Distance:   Bilateral Distance:    Right Eye Near:   Left Eye Near:    Bilateral Near:     Physical Exam Constitutional:      Appearance: She is well-developed.  HENT:     Right Ear: Tympanic membrane normal.     Left Ear: Tympanic membrane normal.     Nose: Congestion and rhinorrhea present.     Mouth/Throat:     Mouth: Mucous membranes are dry.     Pharynx: Pharyngeal swelling, posterior oropharyngeal erythema and uvula swelling present. No oropharyngeal exudate.  Eyes:     Conjunctiva/sclera: Conjunctivae normal.     Pupils: Pupils are equal, round, and reactive to light.  Cardiovascular:     Rate and Rhythm: Normal rate.  Lymphadenopathy:     Cervical: Cervical adenopathy present.  Skin:    General: Skin is warm and dry.  Neurological:     General: No focal deficit present.     Mental Status: She is alert.  Psychiatric:        Mood and Affect: Mood normal.        Behavior: Behavior normal.     UC Treatments / Results  Labs (all labs ordered are listed, but only abnormal results are displayed) Labs Reviewed  POCT RAPID STREP A (OFFICE)    EKG   Radiology No results found.  Procedures Procedures  (including critical care time)  Medications Ordered in UC Medications - No data to display  Initial Impression / Assessment and Plan / UC Course  I have reviewed the triage vital signs and the nursing notes.  Pertinent labs & imaging results that were available during my care of the patient were reviewed by me and considered in my medical decision making (see chart for details).    Treating for an acute URI. Treatment per discharge recommendation orders. C Rapid strep is negative.  Strict return precautions given if symptom worsen or do not improve. Final Clinical Impressions(s) / UC Diagnoses   Final diagnoses:  Acute URI   Discharge Instructions   None    ED Prescriptions     Medication Sig Dispense Auth. Provider   azithromycin (ZITHROMAX) 250 MG tablet Take 2 tabs PO x 1 dose, then 1 tab PO QD x 4 days 6 tablet Bing Neighbors, FNP   benzonatate (TESSALON) 200 MG capsule Take 1 capsule (200 mg total) by mouth 3 (three) times daily as needed for cough. 30 capsule Bing Neighbors, FNP      PDMP not reviewed this encounter.   Bing Neighbors, FNP 04/20/21 1053

## 2021-04-24 ENCOUNTER — Ambulatory Visit (HOSPITAL_COMMUNITY)
Admission: RE | Admit: 2021-04-24 | Discharge: 2021-04-24 | Disposition: A | Discharge status: Home / Self Care | Payer: Medicare Other | Source: Ambulatory Visit | Attending: Nurse Practitioner | Admitting: Nurse Practitioner

## 2021-04-24 ENCOUNTER — Encounter (HOSPITAL_COMMUNITY): Payer: Self-pay

## 2021-04-24 DIAGNOSIS — D440 Neoplasm of uncertain behavior of thyroid gland: Secondary | ICD-10-CM | POA: Diagnosis not present

## 2021-04-24 DIAGNOSIS — E0789 Other specified disorders of thyroid: Secondary | ICD-10-CM | POA: Diagnosis not present

## 2021-04-24 DIAGNOSIS — E041 Nontoxic single thyroid nodule: Secondary | ICD-10-CM | POA: Diagnosis not present

## 2021-04-24 DIAGNOSIS — E042 Nontoxic multinodular goiter: Secondary | ICD-10-CM

## 2021-04-24 MED ORDER — LIDOCAINE HCL (PF) 2 % IJ SOLN
INTRAMUSCULAR | Status: AC
  Administered 2021-04-24: 10 mL
  Filled 2021-04-24: qty 10

## 2021-04-24 MED ORDER — LIDOCAINE HCL (PF) 2 % IJ SOLN: 10.0000 mL | Freq: Once | INTRAMUSCULAR | Status: AC

## 2021-04-24 NOTE — Telephone Encounter (Signed)
[place] Physical Therapist from [facility], called letting office know pt missed her PT appt today 04/24/2021. ?

## 2021-04-24 NOTE — Progress Notes (Signed)
PT tolerated thyroid biopsy procedure well today. Labs and afirma obtained and sent for pathology. PT ambulatory at discharge with no acute distress noted and verbalized understanding of discharge instructions. 

## 2021-04-24 NOTE — Telephone Encounter (Signed)
Noted  

## 2021-04-26 DIAGNOSIS — M7541 Impingement syndrome of right shoulder: Secondary | ICD-10-CM | POA: Diagnosis not present

## 2021-04-26 DIAGNOSIS — Z7982 Long term (current) use of aspirin: Secondary | ICD-10-CM | POA: Diagnosis not present

## 2021-04-26 DIAGNOSIS — Z87891 Personal history of nicotine dependence: Secondary | ICD-10-CM | POA: Diagnosis not present

## 2021-04-26 DIAGNOSIS — I69354 Hemiplegia and hemiparesis following cerebral infarction affecting left non-dominant side: Secondary | ICD-10-CM | POA: Diagnosis not present

## 2021-04-26 DIAGNOSIS — M19019 Primary osteoarthritis, unspecified shoulder: Secondary | ICD-10-CM | POA: Diagnosis not present

## 2021-04-26 DIAGNOSIS — J449 Chronic obstructive pulmonary disease, unspecified: Secondary | ICD-10-CM | POA: Diagnosis not present

## 2021-04-26 DIAGNOSIS — K5900 Constipation, unspecified: Secondary | ICD-10-CM | POA: Diagnosis not present

## 2021-04-26 DIAGNOSIS — H547 Unspecified visual loss: Secondary | ICD-10-CM | POA: Diagnosis not present

## 2021-04-26 DIAGNOSIS — E079 Disorder of thyroid, unspecified: Secondary | ICD-10-CM | POA: Diagnosis not present

## 2021-04-26 DIAGNOSIS — I69398 Other sequelae of cerebral infarction: Secondary | ICD-10-CM | POA: Diagnosis not present

## 2021-04-26 DIAGNOSIS — I69393 Ataxia following cerebral infarction: Secondary | ICD-10-CM | POA: Diagnosis not present

## 2021-04-26 DIAGNOSIS — M1811 Unilateral primary osteoarthritis of first carpometacarpal joint, right hand: Secondary | ICD-10-CM | POA: Diagnosis not present

## 2021-04-26 DIAGNOSIS — E041 Nontoxic single thyroid nodule: Secondary | ICD-10-CM | POA: Diagnosis not present

## 2021-04-26 DIAGNOSIS — G43909 Migraine, unspecified, not intractable, without status migrainosus: Secondary | ICD-10-CM | POA: Diagnosis not present

## 2021-04-26 DIAGNOSIS — L409 Psoriasis, unspecified: Secondary | ICD-10-CM | POA: Diagnosis not present

## 2021-04-26 DIAGNOSIS — E061 Subacute thyroiditis: Secondary | ICD-10-CM | POA: Diagnosis not present

## 2021-04-26 DIAGNOSIS — I129 Hypertensive chronic kidney disease with stage 1 through stage 4 chronic kidney disease, or unspecified chronic kidney disease: Secondary | ICD-10-CM | POA: Diagnosis not present

## 2021-04-26 DIAGNOSIS — Z905 Acquired absence of kidney: Secondary | ICD-10-CM | POA: Diagnosis not present

## 2021-04-26 DIAGNOSIS — N189 Chronic kidney disease, unspecified: Secondary | ICD-10-CM | POA: Diagnosis not present

## 2021-04-26 DIAGNOSIS — L942 Calcinosis cutis: Secondary | ICD-10-CM | POA: Diagnosis not present

## 2021-04-26 DIAGNOSIS — M7502 Adhesive capsulitis of left shoulder: Secondary | ICD-10-CM | POA: Diagnosis not present

## 2021-04-26 LAB — CYTOLOGY - NON PAP

## 2021-04-29 ENCOUNTER — Telehealth: Payer: Self-pay | Admitting: Nurse Practitioner

## 2021-04-29 NOTE — Telephone Encounter (Signed)
Pt called and said she received her biopsy results on mychart but does not know how to read them. She wants to know if its okay to wait until her appointment to discuss or if you will discuss before. ?

## 2021-04-29 NOTE — Telephone Encounter (Signed)
Called the patient and left her a voice message to let her know that we will discuss in the office at her next appointment. Patient called back and verbalized an understanding. ?

## 2021-04-29 NOTE — Telephone Encounter (Signed)
We will discuss at appointment as we only have preliminary results.

## 2021-04-30 DIAGNOSIS — G43909 Migraine, unspecified, not intractable, without status migrainosus: Secondary | ICD-10-CM | POA: Diagnosis not present

## 2021-04-30 DIAGNOSIS — H547 Unspecified visual loss: Secondary | ICD-10-CM | POA: Diagnosis not present

## 2021-04-30 DIAGNOSIS — E061 Subacute thyroiditis: Secondary | ICD-10-CM | POA: Diagnosis not present

## 2021-04-30 DIAGNOSIS — I69393 Ataxia following cerebral infarction: Secondary | ICD-10-CM | POA: Diagnosis not present

## 2021-04-30 DIAGNOSIS — I69398 Other sequelae of cerebral infarction: Secondary | ICD-10-CM | POA: Diagnosis not present

## 2021-04-30 DIAGNOSIS — L409 Psoriasis, unspecified: Secondary | ICD-10-CM | POA: Diagnosis not present

## 2021-04-30 DIAGNOSIS — I129 Hypertensive chronic kidney disease with stage 1 through stage 4 chronic kidney disease, or unspecified chronic kidney disease: Secondary | ICD-10-CM | POA: Diagnosis not present

## 2021-04-30 DIAGNOSIS — Z87891 Personal history of nicotine dependence: Secondary | ICD-10-CM | POA: Diagnosis not present

## 2021-04-30 DIAGNOSIS — M19019 Primary osteoarthritis, unspecified shoulder: Secondary | ICD-10-CM | POA: Diagnosis not present

## 2021-04-30 DIAGNOSIS — M7502 Adhesive capsulitis of left shoulder: Secondary | ICD-10-CM | POA: Diagnosis not present

## 2021-04-30 DIAGNOSIS — N189 Chronic kidney disease, unspecified: Secondary | ICD-10-CM | POA: Diagnosis not present

## 2021-04-30 DIAGNOSIS — K5900 Constipation, unspecified: Secondary | ICD-10-CM | POA: Diagnosis not present

## 2021-04-30 DIAGNOSIS — Z7982 Long term (current) use of aspirin: Secondary | ICD-10-CM | POA: Diagnosis not present

## 2021-04-30 DIAGNOSIS — M7541 Impingement syndrome of right shoulder: Secondary | ICD-10-CM | POA: Diagnosis not present

## 2021-04-30 DIAGNOSIS — J449 Chronic obstructive pulmonary disease, unspecified: Secondary | ICD-10-CM | POA: Diagnosis not present

## 2021-04-30 DIAGNOSIS — M1811 Unilateral primary osteoarthritis of first carpometacarpal joint, right hand: Secondary | ICD-10-CM | POA: Diagnosis not present

## 2021-04-30 DIAGNOSIS — L942 Calcinosis cutis: Secondary | ICD-10-CM | POA: Diagnosis not present

## 2021-04-30 DIAGNOSIS — I69354 Hemiplegia and hemiparesis following cerebral infarction affecting left non-dominant side: Secondary | ICD-10-CM | POA: Diagnosis not present

## 2021-04-30 DIAGNOSIS — Z905 Acquired absence of kidney: Secondary | ICD-10-CM | POA: Diagnosis not present

## 2021-04-30 DIAGNOSIS — E041 Nontoxic single thyroid nodule: Secondary | ICD-10-CM | POA: Diagnosis not present

## 2021-04-30 DIAGNOSIS — E079 Disorder of thyroid, unspecified: Secondary | ICD-10-CM | POA: Diagnosis not present

## 2021-05-01 DIAGNOSIS — E042 Nontoxic multinodular goiter: Secondary | ICD-10-CM | POA: Diagnosis not present

## 2021-05-03 DIAGNOSIS — N189 Chronic kidney disease, unspecified: Secondary | ICD-10-CM | POA: Diagnosis not present

## 2021-05-03 DIAGNOSIS — M7502 Adhesive capsulitis of left shoulder: Secondary | ICD-10-CM | POA: Diagnosis not present

## 2021-05-03 DIAGNOSIS — Z905 Acquired absence of kidney: Secondary | ICD-10-CM | POA: Diagnosis not present

## 2021-05-03 DIAGNOSIS — H547 Unspecified visual loss: Secondary | ICD-10-CM | POA: Diagnosis not present

## 2021-05-03 DIAGNOSIS — E079 Disorder of thyroid, unspecified: Secondary | ICD-10-CM | POA: Diagnosis not present

## 2021-05-03 DIAGNOSIS — M1811 Unilateral primary osteoarthritis of first carpometacarpal joint, right hand: Secondary | ICD-10-CM | POA: Diagnosis not present

## 2021-05-03 DIAGNOSIS — M7541 Impingement syndrome of right shoulder: Secondary | ICD-10-CM | POA: Diagnosis not present

## 2021-05-03 DIAGNOSIS — I69393 Ataxia following cerebral infarction: Secondary | ICD-10-CM | POA: Diagnosis not present

## 2021-05-03 DIAGNOSIS — K5900 Constipation, unspecified: Secondary | ICD-10-CM | POA: Diagnosis not present

## 2021-05-03 DIAGNOSIS — M19019 Primary osteoarthritis, unspecified shoulder: Secondary | ICD-10-CM | POA: Diagnosis not present

## 2021-05-03 DIAGNOSIS — L409 Psoriasis, unspecified: Secondary | ICD-10-CM | POA: Diagnosis not present

## 2021-05-03 DIAGNOSIS — L942 Calcinosis cutis: Secondary | ICD-10-CM | POA: Diagnosis not present

## 2021-05-03 DIAGNOSIS — E061 Subacute thyroiditis: Secondary | ICD-10-CM | POA: Diagnosis not present

## 2021-05-03 DIAGNOSIS — G43909 Migraine, unspecified, not intractable, without status migrainosus: Secondary | ICD-10-CM | POA: Diagnosis not present

## 2021-05-03 DIAGNOSIS — Z87891 Personal history of nicotine dependence: Secondary | ICD-10-CM | POA: Diagnosis not present

## 2021-05-03 DIAGNOSIS — J449 Chronic obstructive pulmonary disease, unspecified: Secondary | ICD-10-CM | POA: Diagnosis not present

## 2021-05-03 DIAGNOSIS — I69398 Other sequelae of cerebral infarction: Secondary | ICD-10-CM | POA: Diagnosis not present

## 2021-05-03 DIAGNOSIS — Z7982 Long term (current) use of aspirin: Secondary | ICD-10-CM | POA: Diagnosis not present

## 2021-05-03 DIAGNOSIS — I69354 Hemiplegia and hemiparesis following cerebral infarction affecting left non-dominant side: Secondary | ICD-10-CM | POA: Diagnosis not present

## 2021-05-03 DIAGNOSIS — E041 Nontoxic single thyroid nodule: Secondary | ICD-10-CM | POA: Diagnosis not present

## 2021-05-03 DIAGNOSIS — I129 Hypertensive chronic kidney disease with stage 1 through stage 4 chronic kidney disease, or unspecified chronic kidney disease: Secondary | ICD-10-CM | POA: Diagnosis not present

## 2021-05-06 DIAGNOSIS — K5900 Constipation, unspecified: Secondary | ICD-10-CM | POA: Diagnosis not present

## 2021-05-06 DIAGNOSIS — G43909 Migraine, unspecified, not intractable, without status migrainosus: Secondary | ICD-10-CM | POA: Diagnosis not present

## 2021-05-06 DIAGNOSIS — E061 Subacute thyroiditis: Secondary | ICD-10-CM | POA: Diagnosis not present

## 2021-05-06 DIAGNOSIS — L409 Psoriasis, unspecified: Secondary | ICD-10-CM | POA: Diagnosis not present

## 2021-05-06 DIAGNOSIS — I69393 Ataxia following cerebral infarction: Secondary | ICD-10-CM | POA: Diagnosis not present

## 2021-05-06 DIAGNOSIS — M1811 Unilateral primary osteoarthritis of first carpometacarpal joint, right hand: Secondary | ICD-10-CM | POA: Diagnosis not present

## 2021-05-06 DIAGNOSIS — Z905 Acquired absence of kidney: Secondary | ICD-10-CM | POA: Diagnosis not present

## 2021-05-06 DIAGNOSIS — Z87891 Personal history of nicotine dependence: Secondary | ICD-10-CM | POA: Diagnosis not present

## 2021-05-06 DIAGNOSIS — E041 Nontoxic single thyroid nodule: Secondary | ICD-10-CM | POA: Diagnosis not present

## 2021-05-06 DIAGNOSIS — H547 Unspecified visual loss: Secondary | ICD-10-CM | POA: Diagnosis not present

## 2021-05-06 DIAGNOSIS — M7541 Impingement syndrome of right shoulder: Secondary | ICD-10-CM | POA: Diagnosis not present

## 2021-05-06 DIAGNOSIS — L942 Calcinosis cutis: Secondary | ICD-10-CM | POA: Diagnosis not present

## 2021-05-06 DIAGNOSIS — I69398 Other sequelae of cerebral infarction: Secondary | ICD-10-CM | POA: Diagnosis not present

## 2021-05-06 DIAGNOSIS — I69354 Hemiplegia and hemiparesis following cerebral infarction affecting left non-dominant side: Secondary | ICD-10-CM | POA: Diagnosis not present

## 2021-05-06 DIAGNOSIS — M7502 Adhesive capsulitis of left shoulder: Secondary | ICD-10-CM | POA: Diagnosis not present

## 2021-05-06 DIAGNOSIS — E079 Disorder of thyroid, unspecified: Secondary | ICD-10-CM | POA: Diagnosis not present

## 2021-05-06 DIAGNOSIS — I129 Hypertensive chronic kidney disease with stage 1 through stage 4 chronic kidney disease, or unspecified chronic kidney disease: Secondary | ICD-10-CM | POA: Diagnosis not present

## 2021-05-06 DIAGNOSIS — M19019 Primary osteoarthritis, unspecified shoulder: Secondary | ICD-10-CM | POA: Diagnosis not present

## 2021-05-06 DIAGNOSIS — Z7982 Long term (current) use of aspirin: Secondary | ICD-10-CM | POA: Diagnosis not present

## 2021-05-06 DIAGNOSIS — J449 Chronic obstructive pulmonary disease, unspecified: Secondary | ICD-10-CM | POA: Diagnosis not present

## 2021-05-06 DIAGNOSIS — N189 Chronic kidney disease, unspecified: Secondary | ICD-10-CM | POA: Diagnosis not present

## 2021-05-08 DIAGNOSIS — M19019 Primary osteoarthritis, unspecified shoulder: Secondary | ICD-10-CM | POA: Diagnosis not present

## 2021-05-08 DIAGNOSIS — Z905 Acquired absence of kidney: Secondary | ICD-10-CM | POA: Diagnosis not present

## 2021-05-08 DIAGNOSIS — I129 Hypertensive chronic kidney disease with stage 1 through stage 4 chronic kidney disease, or unspecified chronic kidney disease: Secondary | ICD-10-CM | POA: Diagnosis not present

## 2021-05-08 DIAGNOSIS — N189 Chronic kidney disease, unspecified: Secondary | ICD-10-CM | POA: Diagnosis not present

## 2021-05-08 DIAGNOSIS — M7541 Impingement syndrome of right shoulder: Secondary | ICD-10-CM | POA: Diagnosis not present

## 2021-05-08 DIAGNOSIS — G43909 Migraine, unspecified, not intractable, without status migrainosus: Secondary | ICD-10-CM | POA: Diagnosis not present

## 2021-05-08 DIAGNOSIS — I69398 Other sequelae of cerebral infarction: Secondary | ICD-10-CM | POA: Diagnosis not present

## 2021-05-08 DIAGNOSIS — M1811 Unilateral primary osteoarthritis of first carpometacarpal joint, right hand: Secondary | ICD-10-CM | POA: Diagnosis not present

## 2021-05-08 DIAGNOSIS — I69354 Hemiplegia and hemiparesis following cerebral infarction affecting left non-dominant side: Secondary | ICD-10-CM | POA: Diagnosis not present

## 2021-05-08 DIAGNOSIS — H547 Unspecified visual loss: Secondary | ICD-10-CM | POA: Diagnosis not present

## 2021-05-08 DIAGNOSIS — Z87891 Personal history of nicotine dependence: Secondary | ICD-10-CM | POA: Diagnosis not present

## 2021-05-08 DIAGNOSIS — M7502 Adhesive capsulitis of left shoulder: Secondary | ICD-10-CM | POA: Diagnosis not present

## 2021-05-08 DIAGNOSIS — Z7982 Long term (current) use of aspirin: Secondary | ICD-10-CM | POA: Diagnosis not present

## 2021-05-08 DIAGNOSIS — E041 Nontoxic single thyroid nodule: Secondary | ICD-10-CM | POA: Diagnosis not present

## 2021-05-08 DIAGNOSIS — E061 Subacute thyroiditis: Secondary | ICD-10-CM | POA: Diagnosis not present

## 2021-05-08 DIAGNOSIS — L942 Calcinosis cutis: Secondary | ICD-10-CM | POA: Diagnosis not present

## 2021-05-08 DIAGNOSIS — I69393 Ataxia following cerebral infarction: Secondary | ICD-10-CM | POA: Diagnosis not present

## 2021-05-08 DIAGNOSIS — E079 Disorder of thyroid, unspecified: Secondary | ICD-10-CM | POA: Diagnosis not present

## 2021-05-08 DIAGNOSIS — K5900 Constipation, unspecified: Secondary | ICD-10-CM | POA: Diagnosis not present

## 2021-05-08 DIAGNOSIS — J449 Chronic obstructive pulmonary disease, unspecified: Secondary | ICD-10-CM | POA: Diagnosis not present

## 2021-05-08 DIAGNOSIS — L409 Psoriasis, unspecified: Secondary | ICD-10-CM | POA: Diagnosis not present

## 2021-05-09 DIAGNOSIS — Z1231 Encounter for screening mammogram for malignant neoplasm of breast: Secondary | ICD-10-CM | POA: Diagnosis not present

## 2021-05-14 DIAGNOSIS — M7502 Adhesive capsulitis of left shoulder: Secondary | ICD-10-CM | POA: Diagnosis not present

## 2021-05-14 DIAGNOSIS — N189 Chronic kidney disease, unspecified: Secondary | ICD-10-CM | POA: Diagnosis not present

## 2021-05-14 DIAGNOSIS — I69398 Other sequelae of cerebral infarction: Secondary | ICD-10-CM | POA: Diagnosis not present

## 2021-05-14 DIAGNOSIS — M1811 Unilateral primary osteoarthritis of first carpometacarpal joint, right hand: Secondary | ICD-10-CM | POA: Diagnosis not present

## 2021-05-14 DIAGNOSIS — L409 Psoriasis, unspecified: Secondary | ICD-10-CM | POA: Diagnosis not present

## 2021-05-14 DIAGNOSIS — G43909 Migraine, unspecified, not intractable, without status migrainosus: Secondary | ICD-10-CM | POA: Diagnosis not present

## 2021-05-14 DIAGNOSIS — I129 Hypertensive chronic kidney disease with stage 1 through stage 4 chronic kidney disease, or unspecified chronic kidney disease: Secondary | ICD-10-CM | POA: Diagnosis not present

## 2021-05-14 DIAGNOSIS — Z905 Acquired absence of kidney: Secondary | ICD-10-CM | POA: Diagnosis not present

## 2021-05-14 DIAGNOSIS — I69393 Ataxia following cerebral infarction: Secondary | ICD-10-CM | POA: Diagnosis not present

## 2021-05-14 DIAGNOSIS — E061 Subacute thyroiditis: Secondary | ICD-10-CM | POA: Diagnosis not present

## 2021-05-14 DIAGNOSIS — H547 Unspecified visual loss: Secondary | ICD-10-CM | POA: Diagnosis not present

## 2021-05-14 DIAGNOSIS — Z87891 Personal history of nicotine dependence: Secondary | ICD-10-CM | POA: Diagnosis not present

## 2021-05-14 DIAGNOSIS — Z7982 Long term (current) use of aspirin: Secondary | ICD-10-CM | POA: Diagnosis not present

## 2021-05-14 DIAGNOSIS — I69354 Hemiplegia and hemiparesis following cerebral infarction affecting left non-dominant side: Secondary | ICD-10-CM | POA: Diagnosis not present

## 2021-05-14 DIAGNOSIS — K5900 Constipation, unspecified: Secondary | ICD-10-CM | POA: Diagnosis not present

## 2021-05-14 DIAGNOSIS — J449 Chronic obstructive pulmonary disease, unspecified: Secondary | ICD-10-CM | POA: Diagnosis not present

## 2021-05-14 DIAGNOSIS — M19019 Primary osteoarthritis, unspecified shoulder: Secondary | ICD-10-CM | POA: Diagnosis not present

## 2021-05-14 DIAGNOSIS — E041 Nontoxic single thyroid nodule: Secondary | ICD-10-CM | POA: Diagnosis not present

## 2021-05-14 DIAGNOSIS — L942 Calcinosis cutis: Secondary | ICD-10-CM | POA: Diagnosis not present

## 2021-05-14 DIAGNOSIS — M7541 Impingement syndrome of right shoulder: Secondary | ICD-10-CM | POA: Diagnosis not present

## 2021-05-14 DIAGNOSIS — E079 Disorder of thyroid, unspecified: Secondary | ICD-10-CM | POA: Diagnosis not present

## 2021-05-16 DIAGNOSIS — L409 Psoriasis, unspecified: Secondary | ICD-10-CM | POA: Diagnosis not present

## 2021-05-16 DIAGNOSIS — M1811 Unilateral primary osteoarthritis of first carpometacarpal joint, right hand: Secondary | ICD-10-CM | POA: Diagnosis not present

## 2021-05-16 DIAGNOSIS — K5900 Constipation, unspecified: Secondary | ICD-10-CM | POA: Diagnosis not present

## 2021-05-16 DIAGNOSIS — E041 Nontoxic single thyroid nodule: Secondary | ICD-10-CM | POA: Diagnosis not present

## 2021-05-16 DIAGNOSIS — J449 Chronic obstructive pulmonary disease, unspecified: Secondary | ICD-10-CM | POA: Diagnosis not present

## 2021-05-16 DIAGNOSIS — N189 Chronic kidney disease, unspecified: Secondary | ICD-10-CM | POA: Diagnosis not present

## 2021-05-16 DIAGNOSIS — Z905 Acquired absence of kidney: Secondary | ICD-10-CM | POA: Diagnosis not present

## 2021-05-16 DIAGNOSIS — H547 Unspecified visual loss: Secondary | ICD-10-CM | POA: Diagnosis not present

## 2021-05-16 DIAGNOSIS — Z7982 Long term (current) use of aspirin: Secondary | ICD-10-CM | POA: Diagnosis not present

## 2021-05-16 DIAGNOSIS — I69398 Other sequelae of cerebral infarction: Secondary | ICD-10-CM | POA: Diagnosis not present

## 2021-05-16 DIAGNOSIS — Z87891 Personal history of nicotine dependence: Secondary | ICD-10-CM | POA: Diagnosis not present

## 2021-05-16 DIAGNOSIS — M7502 Adhesive capsulitis of left shoulder: Secondary | ICD-10-CM | POA: Diagnosis not present

## 2021-05-16 DIAGNOSIS — G43909 Migraine, unspecified, not intractable, without status migrainosus: Secondary | ICD-10-CM | POA: Diagnosis not present

## 2021-05-16 DIAGNOSIS — E079 Disorder of thyroid, unspecified: Secondary | ICD-10-CM | POA: Diagnosis not present

## 2021-05-16 DIAGNOSIS — E061 Subacute thyroiditis: Secondary | ICD-10-CM | POA: Diagnosis not present

## 2021-05-16 DIAGNOSIS — I129 Hypertensive chronic kidney disease with stage 1 through stage 4 chronic kidney disease, or unspecified chronic kidney disease: Secondary | ICD-10-CM | POA: Diagnosis not present

## 2021-05-16 DIAGNOSIS — M19019 Primary osteoarthritis, unspecified shoulder: Secondary | ICD-10-CM | POA: Diagnosis not present

## 2021-05-16 DIAGNOSIS — I69354 Hemiplegia and hemiparesis following cerebral infarction affecting left non-dominant side: Secondary | ICD-10-CM | POA: Diagnosis not present

## 2021-05-16 DIAGNOSIS — L942 Calcinosis cutis: Secondary | ICD-10-CM | POA: Diagnosis not present

## 2021-05-16 DIAGNOSIS — I69393 Ataxia following cerebral infarction: Secondary | ICD-10-CM | POA: Diagnosis not present

## 2021-05-16 DIAGNOSIS — M7541 Impingement syndrome of right shoulder: Secondary | ICD-10-CM | POA: Diagnosis not present

## 2021-05-17 ENCOUNTER — Encounter (HOSPITAL_COMMUNITY): Payer: Self-pay

## 2021-05-21 DIAGNOSIS — J449 Chronic obstructive pulmonary disease, unspecified: Secondary | ICD-10-CM | POA: Diagnosis not present

## 2021-05-21 DIAGNOSIS — G43909 Migraine, unspecified, not intractable, without status migrainosus: Secondary | ICD-10-CM | POA: Diagnosis not present

## 2021-05-21 DIAGNOSIS — I69393 Ataxia following cerebral infarction: Secondary | ICD-10-CM | POA: Diagnosis not present

## 2021-05-21 DIAGNOSIS — Z87891 Personal history of nicotine dependence: Secondary | ICD-10-CM | POA: Diagnosis not present

## 2021-05-21 DIAGNOSIS — M7541 Impingement syndrome of right shoulder: Secondary | ICD-10-CM | POA: Diagnosis not present

## 2021-05-21 DIAGNOSIS — M19019 Primary osteoarthritis, unspecified shoulder: Secondary | ICD-10-CM | POA: Diagnosis not present

## 2021-05-21 DIAGNOSIS — L942 Calcinosis cutis: Secondary | ICD-10-CM | POA: Diagnosis not present

## 2021-05-21 DIAGNOSIS — I69354 Hemiplegia and hemiparesis following cerebral infarction affecting left non-dominant side: Secondary | ICD-10-CM | POA: Diagnosis not present

## 2021-05-21 DIAGNOSIS — K5900 Constipation, unspecified: Secondary | ICD-10-CM | POA: Diagnosis not present

## 2021-05-21 DIAGNOSIS — Z905 Acquired absence of kidney: Secondary | ICD-10-CM | POA: Diagnosis not present

## 2021-05-21 DIAGNOSIS — H547 Unspecified visual loss: Secondary | ICD-10-CM | POA: Diagnosis not present

## 2021-05-21 DIAGNOSIS — M1811 Unilateral primary osteoarthritis of first carpometacarpal joint, right hand: Secondary | ICD-10-CM | POA: Diagnosis not present

## 2021-05-21 DIAGNOSIS — M7502 Adhesive capsulitis of left shoulder: Secondary | ICD-10-CM | POA: Diagnosis not present

## 2021-05-21 DIAGNOSIS — N189 Chronic kidney disease, unspecified: Secondary | ICD-10-CM | POA: Diagnosis not present

## 2021-05-21 DIAGNOSIS — E061 Subacute thyroiditis: Secondary | ICD-10-CM | POA: Diagnosis not present

## 2021-05-21 DIAGNOSIS — E079 Disorder of thyroid, unspecified: Secondary | ICD-10-CM | POA: Diagnosis not present

## 2021-05-21 DIAGNOSIS — Z7982 Long term (current) use of aspirin: Secondary | ICD-10-CM | POA: Diagnosis not present

## 2021-05-21 DIAGNOSIS — I129 Hypertensive chronic kidney disease with stage 1 through stage 4 chronic kidney disease, or unspecified chronic kidney disease: Secondary | ICD-10-CM | POA: Diagnosis not present

## 2021-05-21 DIAGNOSIS — I69398 Other sequelae of cerebral infarction: Secondary | ICD-10-CM | POA: Diagnosis not present

## 2021-05-21 DIAGNOSIS — L409 Psoriasis, unspecified: Secondary | ICD-10-CM | POA: Diagnosis not present

## 2021-05-21 DIAGNOSIS — E041 Nontoxic single thyroid nodule: Secondary | ICD-10-CM | POA: Diagnosis not present

## 2021-05-22 ENCOUNTER — Ambulatory Visit (INDEPENDENT_AMBULATORY_CARE_PROVIDER_SITE_OTHER): Payer: Medicaid Other | Admitting: Family

## 2021-05-22 ENCOUNTER — Encounter: Payer: Self-pay | Admitting: Family

## 2021-05-22 VITALS — BP 124/80 | HR 87 | Temp 98.0°F | Resp 16 | Ht 66.25 in | Wt 136.0 lb

## 2021-05-22 DIAGNOSIS — M7502 Adhesive capsulitis of left shoulder: Secondary | ICD-10-CM

## 2021-05-22 DIAGNOSIS — M2550 Pain in unspecified joint: Secondary | ICD-10-CM | POA: Diagnosis not present

## 2021-05-22 DIAGNOSIS — Z789 Other specified health status: Secondary | ICD-10-CM

## 2021-05-22 DIAGNOSIS — I639 Cerebral infarction, unspecified: Secondary | ICD-10-CM | POA: Diagnosis not present

## 2021-05-22 DIAGNOSIS — D649 Anemia, unspecified: Secondary | ICD-10-CM | POA: Insufficient documentation

## 2021-05-22 DIAGNOSIS — H5203 Hypermetropia, bilateral: Secondary | ICD-10-CM | POA: Diagnosis not present

## 2021-05-22 DIAGNOSIS — H25813 Combined forms of age-related cataract, bilateral: Secondary | ICD-10-CM | POA: Diagnosis not present

## 2021-05-22 DIAGNOSIS — D518 Other vitamin B12 deficiency anemias: Secondary | ICD-10-CM | POA: Diagnosis not present

## 2021-05-22 DIAGNOSIS — E782 Mixed hyperlipidemia: Secondary | ICD-10-CM

## 2021-05-22 DIAGNOSIS — E041 Nontoxic single thyroid nodule: Secondary | ICD-10-CM | POA: Diagnosis not present

## 2021-05-22 DIAGNOSIS — J301 Allergic rhinitis due to pollen: Secondary | ICD-10-CM | POA: Diagnosis not present

## 2021-05-22 DIAGNOSIS — N1831 Chronic kidney disease, stage 3a: Secondary | ICD-10-CM

## 2021-05-22 DIAGNOSIS — I10 Essential (primary) hypertension: Secondary | ICD-10-CM

## 2021-05-22 DIAGNOSIS — J439 Emphysema, unspecified: Secondary | ICD-10-CM

## 2021-05-22 DIAGNOSIS — Z8639 Personal history of other endocrine, nutritional and metabolic disease: Secondary | ICD-10-CM | POA: Diagnosis not present

## 2021-05-22 DIAGNOSIS — R052 Subacute cough: Secondary | ICD-10-CM | POA: Diagnosis not present

## 2021-05-22 DIAGNOSIS — D582 Other hemoglobinopathies: Secondary | ICD-10-CM | POA: Diagnosis not present

## 2021-05-22 DIAGNOSIS — K5904 Chronic idiopathic constipation: Secondary | ICD-10-CM

## 2021-05-22 DIAGNOSIS — R051 Acute cough: Secondary | ICD-10-CM | POA: Insufficient documentation

## 2021-05-22 LAB — CBC WITH DIFFERENTIAL/PLATELET
Basophils Absolute: 0 10*3/uL (ref 0.0–0.1)
Basophils Relative: 0.7 % (ref 0.0–3.0)
Eosinophils Absolute: 0.2 10*3/uL (ref 0.0–0.7)
Eosinophils Relative: 3 % (ref 0.0–5.0)
HCT: 42.7 % (ref 36.0–46.0)
Hemoglobin: 14.2 g/dL (ref 12.0–15.0)
Lymphocytes Relative: 26.7 % (ref 12.0–46.0)
Lymphs Abs: 1.5 10*3/uL (ref 0.7–4.0)
MCHC: 33.4 g/dL (ref 30.0–36.0)
MCV: 88.5 fl (ref 78.0–100.0)
Monocytes Absolute: 0.4 10*3/uL (ref 0.1–1.0)
Monocytes Relative: 7.7 % (ref 3.0–12.0)
Neutro Abs: 3.4 10*3/uL (ref 1.4–7.7)
Neutrophils Relative %: 61.9 % (ref 43.0–77.0)
Platelets: 256 10*3/uL (ref 150.0–400.0)
RBC: 4.82 Mil/uL (ref 3.87–5.11)
RDW: 12.5 % (ref 11.5–15.5)
WBC: 5.5 10*3/uL (ref 4.0–10.5)

## 2021-05-22 LAB — B12 AND FOLATE PANEL
Folate: 21.6 ng/mL (ref >5.9)
Vitamin B-12: 296 pg/mL (ref 211–911)

## 2021-05-22 LAB — COMPREHENSIVE METABOLIC PANEL
ALT: 16 U/L (ref 0–35)
AST: 22 U/L (ref 0–37)
Albumin: 4.3 g/dL (ref 3.5–5.2)
Alkaline Phosphatase: 114 U/L (ref 39–117)
BUN: 13 mg/dL (ref 6–23)
CO2: 31 mEq/L (ref 19–32)
Calcium: 10 mg/dL (ref 8.4–10.5)
Chloride: 102 mEq/L (ref 96–112)
Creatinine, Ser: 1.1 mg/dL (ref 0.40–1.20)
GFR: 52.27 mL/min — ABNORMAL LOW (ref >60.00)
Glucose, Bld: 90 mg/dL (ref 70–99)
Potassium: 3.6 mEq/L (ref 3.5–5.1)
Sodium: 141 mEq/L (ref 135–145)
Total Bilirubin: 0.6 mg/dL (ref 0.2–1.2)
Total Protein: 7.6 g/dL (ref 6.0–8.3)

## 2021-05-22 LAB — SEDIMENTATION RATE: Sed Rate: 25 mm/hr (ref 0–30)

## 2021-05-22 LAB — VITAMIN D 25 HYDROXY (VIT D DEFICIENCY, FRACTURES): VITD: 59.28 ng/mL (ref 30.00–100.00)

## 2021-05-22 MED ORDER — MONTELUKAST SODIUM 10 MG PO TABS: 10.0000 mg | ORAL_TABLET | Freq: Every day | ORAL | 3 refills | Status: DC

## 2021-05-22 MED ORDER — LEVOCETIRIZINE DIHYDROCHLORIDE 5 MG PO TABS: 5.0000 mg | ORAL_TABLET | Freq: Every evening | ORAL | Status: DC

## 2021-05-22 NOTE — Assessment & Plan Note (Addendum)
Continue baby asa ?Reviewed CT head 2022 ?Continue physical therapy as scheduled ?

## 2021-05-22 NOTE — Assessment & Plan Note (Signed)
Start rx singulair nightly 10 mg ?Continue flonase , try 1 spray bil nares qd to see if tolerated ?Continue xyzal 5 mg ?

## 2021-05-22 NOTE — Patient Instructions (Addendum)
Sending in new RX for singulair , take this nightly as well.  ?Continue xyzal and also try one spray once daily in each nostril of nostril.  ? ?A referral was placed today for lung screening clinic. ?Please let us know if you have not heard back within 2 weeks about the referral. ? ?Welcome to our clinic, I am happy to have you as my new patient. I am excited to continue on this healthcare journey with you. ? ?Stop by the lab prior to leaving today. I will notify you of your results once received.  ? ?Please keep in mind ?Any my chart messages you send have p to a three business day turnaround for a response.  ?Phone calls may have up to a one day business turnaround for a  response.  ? ?If you need a medication refill I recommend you request it through the pharmacy as this is easiest for [place] rather than sending a message and or phone call.  ? ?Due to recent changes in healthcare laws, you may see results of your imaging and/or laboratory studies on MyChart before I have had a chance to review them.  I understand that in some cases there may be results that are confusing or concerning to you. Please understand that not all results are received at the same time and often I may need to interpret multiple results in order to provide you with the best plan of care or course of treatment. Therefore, I ask that you please give me 2 business days to thoroughly review all your results before contacting my office for clarification. Should we see a critical lab result, you will be contacted sooner.  ? ?It was a pleasure seeing you today! Please do not hesitate to reach out with any questions and or concerns. ? ?Regards,  ? ?Stormie Ventola ?FNP-C ? ? ? ? ?

## 2021-05-22 NOTE — Assessment & Plan Note (Signed)
Smoker x 54 years  ?Pt with subacute cough ?Referral to lung screening clinic for low dose CT ?

## 2021-05-22 NOTE — Assessment & Plan Note (Signed)
Order pth calcium and vitamin d ?pending results ?

## 2021-05-22 NOTE — Assessment & Plan Note (Signed)
Repeat cbc pending results 

## 2021-05-22 NOTE — Assessment & Plan Note (Signed)
cmp today  ?Continue to monitor, as with one kidney ?

## 2021-05-22 NOTE — Assessment & Plan Note (Signed)
Left sided, continue with physical therapy ?

## 2021-05-22 NOTE — Assessment & Plan Note (Addendum)
Workup for ana rf and sed rate ?Pending results ?Pt on PE with heberden nodules and curvature, mom with RA history ?

## 2021-05-22 NOTE — Assessment & Plan Note (Signed)
Ordered lipid panel, pending results. Work on low cholesterol diet and exercise as tolerated ? ?

## 2021-05-22 NOTE — Assessment & Plan Note (Signed)
Reviewed pathology ?Pt to f/u with endo in regards to bx results and monitoring of thyroid ?

## 2021-05-22 NOTE — Progress Notes (Signed)
? ?New Patient Office Visit ? ?Subjective:  ?Patient ID: [name], female    DOB: 10-Feb-1955  Age: 66 y.o. MRN: [id number] ? ?CC:  ?Chief Complaint  ?Patient presents with  ? Establish Care  ? ? ?HPI ?[name] is here to establish care as a new patient. ?Currently living with her son, but is hoping to maybe move to [place] soon to be closer to family in [place].  ? ?Prior provider was: Allyn Kenner, PCP  ?Pt is without acute concerns.  ? ?Went to urgent care for URI  about one month ago, for cough. Still ongoing. Given benzonatate which helps to stop the cough enough so she can sleep but otherwise not helping a whole lot. She has had for over a month at this time. (Notable, both parents with h/o pulmonary fibrosis, and pt was a smoker x 54 years)  ? ?Allergic rhinitis: stopped claritin, started on allegra, then put on xyzal. Not taking flonase daily. Dry cough.  ? ?chronic concerns: ? ?Chronic constipation: sennakot prn does well. Stable.  ? ?H/o CVA: 2021, Suspected also had in 2015. Has physical therapy for left sided weakness. Currently taking daily baby asa. CT head wo contrast 5/3, 2022, with encephalomalacia right calcarine and para hippocampal gyri, with remote right pca territory infarct.  ? ?Not sure if has COPD or not, suggestion of this, is on inhaler but was given sample at old pcp office but she states isn't helping and she doesn't know the name of the inhaler. Did review CXR from 2017 which did suggest COPD vs emphysema ? ?HTN: amlodipine 5 mg , doing well. Todays blood pressure 124/80 ? ?Right thyroid nodule: seeing endocrinologist. Had biopsy of right thyroid nodule. Has f/u for report coming up as she is not sure of results quite yet.reviewed pathology from 4/19, atypica of underermined significance, pending afirma testing.  ?Lab Results  ?Component Value Date  ? TSH 8.880 (H) 03/26/2021  ? ?CKD: last egfr 52, creatinine 1.16 in 2021. Did donate a kidney to her daughter, so only  with one kidney in place.  ? ?HLD: lipitor 40 mg once daily, working on low cholesterol diet.  ? ?H/o smoker: cigarettes x 54 years. ? ?Past Medical History:  ?Diagnosis Date  ? Allergy   ? Anxiety   ? Arthritis   ? Chronic kidney disease   ? kidney donor, has only one kidney  ? Hypertension   ? 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 Migraines   ? Stroke ([place]) 09/19/2019  ? Left side & vision  ? Thyroid disease   ? Hx of   ? ? ?Past Surgical History:  ?Procedure Laterality Date  ? ABDOMINAL HYSTERECTOMY  1991  ? partial, still with right ovary  ? BLADDER SURGERY    ? bladder tac, then bladder tack reversal due to adhesions.  ? DORSAL COMPARTMENT RELEASE Right 02/07/2013  ? Procedure: RIGHT WRIST DEQUERVAIN RELEASE;  Surgeon: Jolyn Nap, MD;  Location: [facility];  Service: Orthopedics;  Laterality: Right;  ? KIDNEY DONATION  2008  ? gave her lt kidney to daughter  ? NECK MASS EXCISION    ? rt-mass  ? NECK SURGERY  1990  ? parotidectomy, one gland removed  ? SHOULDER ARTHROSCOPY WITH OPEN ROTATOR CUFF REPAIR AND DISTAL CLAVICLE ACROMINECTOMY Right 04/25/2014  ? Procedure: SHOULDER ARTHROSCOPY WITH MINI-OPEN ROTATOR CUFF REPAIR AND DISTAL CLAVICLE RESECTION;  Surgeon: Garald Balding, MD;  Location:  [facility];  Service: Orthopedics;  Laterality: Right;  ? SHOULDER CLOSED REDUCTION Right 08/31/2014  ? Procedure: CLOSED MANIPULATION SHOULDER;  Surgeon: Garald Balding, MD;  Location: [facility];  Service: Orthopedics;  Laterality: Right;  ? SUBACROMIAL DECOMPRESSION Right 04/25/2014  ? Procedure: SUBACROMIAL DECOMPRESSION;  Surgeon: Garald Balding, MD;  Location: [facility];  Service: Orthopedics;  Laterality: Right;  ? THYROID SURGERY    ? bx right thyroid  ? ? ?Family History  ?Problem Relation Age of Onset  ? Hypertension Mother   ? Heart disease Mother   ?     Irregular heart beat   ? Arrhythmia Mother   ? Rheum arthritis  Mother   ? Pulmonary fibrosis Mother   ? Osteoporosis Mother   ? Lung cancer Father   ? COPD Father   ? Pulmonary fibrosis Father   ? Hypertension Sister   ? Hypertension Sister   ? Aneurysm Brother 11  ?     brain  ? Heart disease Maternal Grandmother   ? Heart disease Maternal Aunt   ? Pulmonary fibrosis Maternal Aunt   ? Rheum arthritis Maternal Uncle   ? Pulmonary fibrosis Maternal Uncle   ? Diabetes Maternal Uncle   ? Colon cancer Neg Hx   ? Colon polyps Neg Hx   ? ? ?Social History  ? ?Socioeconomic History  ? Marital status: Single  ?  Spouse name: Not on file  ? Number of children: 5  ? Years of education: Not on file  ? Highest education level: Not on file  ?Occupational History  ? Occupation: Disabled  ?  Employer: [employer]  ?  Comment: assembly line  ?Tobacco Use  ? Smoking status: Former  ?  Packs/day: 0.25  ?  Years: 54.00  ?  Pack years: 13.50  ?  Types: Cigarettes, E-cigarettes  ?  Quit date: 10/25/2014  ?  Years since quitting: 6.5  ? Smokeless tobacco: Never  ?Vaping Use  ? Vaping Use: Never used  ?Substance and Sexual Activity  ? Alcohol use: No  ? Drug use: No  ? Sexual activity: Not Currently  ?Other Topics Concern  ? Not on file  ?Social History Narrative  ? Pt donated kidney to daughter 5 yrs ago.  ? Lives with son and his family  ? Right Handed   ? Drinks 3-4 cups caffeine daily  ?   ? ?Social Determinants of Health  ? ?Financial Resource Strain: Not on file  ?Food Insecurity: Not on file  ?Transportation Needs: Not on file  ?Physical Activity: Not on file  ?Stress: Not on file  ?Social Connections: Not on file  ?Intimate Partner Violence: Not on file  ? ? ?Outpatient Medications Prior to Visit  ?Medication Sig Dispense Refill  ? acetaminophen (TYLENOL) 325 MG tablet Take 2 tablets (650 mg total) by mouth every 4 (four) hours as needed for mild pain (or temp > 37.5 C (99.5 F)).    ? amLODipine (NORVASC) 5 MG tablet Take 5 mg by mouth daily.     ? aspirin EC 81 MG EC tablet Take 1 tablet (81 mg  total) by mouth daily. Swallow whole. 30 tablet 11  ? atorvastatin (LIPITOR) 40 MG tablet Take 40 mg by mouth every other day.    ? benzonatate (TESSALON) 200 MG capsule Take 1 capsule (200 mg total) by mouth 3 (three) times daily as needed for cough. 30 capsule 0  ? fluticasone (FLONASE) 50 MCG/ACT  nasal spray Place 2 sprays into both nostrils daily. (Patient taking differently: Place 2 sprays into both nostrils as needed.)  2  ? azithromycin (ZITHROMAX) 250 MG tablet Take 2 tabs PO x 1 dose, then 1 tab PO QD x 4 days 6 tablet 0  ? fexofenadine (ALLEGRA) 180 MG tablet Take 180 mg by mouth daily as needed for allergies.     ? SUMAtriptan (IMITREX) 25 MG tablet Take 1 tablet (25 mg total) by mouth every 2 (two) hours as needed for up to 2 doses for migraine or headache. May repeat in 2 hours if headache persists or recurs. 10 tablet 1  ? ?No facility-administered medications prior to visit.  ? ? ?Allergies  ?Allergen Reactions  ? Celebrex [Celecoxib] Anaphylaxis and Hives  ? Sulfonamide Derivatives Anaphylaxis, Swelling and Rash  ? Ace Inhibitors Cough  ? Dilaudid [Hydromorphone] Nausea And Vomiting  ?  Severe N/V  ? Erythromycin Nausea And Vomiting  ? Lactose Intolerance (Gi) Diarrhea  ?  cramping  ? Sudafed [Pseudoephedrine Hcl]   ?  Makes "high"  ? Losartan Palpitations  ? Mucinex [Guaifenesin Er] Palpitations  ? ? ?ROS ?Review of Systems  ?Constitutional:  Negative for chills, fatigue and fever.  ?HENT:  Positive for postnasal drip. Negative for congestion, sinus pressure, sinus pain, sneezing and sore throat.   ?Respiratory:  Positive for cough (dry cough). Negative for shortness of breath.   ?Cardiovascular:  Negative for chest pain and leg swelling.  ?Gastrointestinal:  Negative for diarrhea and nausea.  ?Genitourinary:  Negative for difficulty urinating, menstrual problem and vaginal bleeding.  ?Neurological:  Negative for dizziness and headaches.  ?Psychiatric/Behavioral:  Negative for agitation and sleep  disturbance.   ?All other systems reviewed and are negative. ? ? ? ?  ?Objective:  ?  ?Physical Exam ?Vitals reviewed.  ?Constitutional:   ?   General: She is not in acute distress. ?   Appearance: Normal ap

## 2021-05-22 NOTE — Assessment & Plan Note (Signed)
Continue amlodipine 5 mg  Pt advised of the following:  Continue medication as prescribed. Monitor blood pressure periodically and/or when you feel symptomatic. Goal is <130/90 on average. Ensure that you have rested for 30 minutes prior to checking your blood pressure. Record your readings and bring them to your next visit if necessary.work on a low sodium diet.  

## 2021-05-22 NOTE — Assessment & Plan Note (Signed)
Increase fiber ?Increase water intake ?sennakot prn ?

## 2021-05-22 NOTE — Assessment & Plan Note (Signed)
Consider CXR ?Trial singulair 10 mg  ?Referral to lung screening clinic ?Consider spirometry ?

## 2021-05-22 NOTE — Assessment & Plan Note (Signed)
Pt to let me know what sample name of inhaler she was using so I can add to chart.  ?

## 2021-05-23 NOTE — Progress Notes (Signed)
Kidneys stable.  B12 is on the lower end of normal. Start otc vitamin b12 1000 mcg once daily.  Vitamin d is normal range.  Sed rate negative. Still pending a few more labs.

## 2021-05-25 LAB — ANTI-NUCLEAR AB-TITER (ANA TITER): ANA Titer 1: 1:40 {titer} — ABNORMAL HIGH

## 2021-05-25 LAB — ANA: Anti Nuclear Antibody (ANA): POSITIVE — AB

## 2021-05-25 LAB — PTH, INTACT AND CALCIUM
Calcium: 9.6 mg/dL (ref 8.6–10.4)
PTH: 65 pg/mL (ref 16–77)

## 2021-05-27 ENCOUNTER — Other Ambulatory Visit: Payer: Self-pay | Admitting: Family

## 2021-05-27 ENCOUNTER — Telehealth: Payer: Self-pay | Admitting: Family

## 2021-05-27 DIAGNOSIS — R768 Other specified abnormal immunological findings in serum: Secondary | ICD-10-CM

## 2021-05-27 DIAGNOSIS — M2550 Pain in unspecified joint: Secondary | ICD-10-CM

## 2021-05-27 DIAGNOSIS — M151 Heberden's nodes (with arthropathy): Secondary | ICD-10-CM

## 2021-05-27 DIAGNOSIS — Z8261 Family history of arthritis: Secondary | ICD-10-CM

## 2021-05-27 NOTE — Telephone Encounter (Signed)
Patient would like a call to discuss lab results that came in over the weekend. I explained that the provider will review and someone will call to discuss soon.

## 2021-05-27 NOTE — Progress Notes (Signed)
ANA slightly positive, given mom had rheumatoid arthritis and pt with polyarthralgia as well as nodule on physical exam, I am referring pt to rheumatology for evaluation. Referral has been placed. This is a low positive, might be false positive but given other factors going to refer.

## 2021-05-27 NOTE — Telephone Encounter (Signed)
Will be calling pt after [name] looks at the lab results.

## 2021-06-13 DIAGNOSIS — H269 Unspecified cataract: Secondary | ICD-10-CM | POA: Diagnosis not present

## 2021-06-13 DIAGNOSIS — H2512 Age-related nuclear cataract, left eye: Secondary | ICD-10-CM | POA: Diagnosis not present

## 2021-06-21 DIAGNOSIS — R7989 Other specified abnormal findings of blood chemistry: Secondary | ICD-10-CM | POA: Diagnosis not present

## 2021-06-21 DIAGNOSIS — E042 Nontoxic multinodular goiter: Secondary | ICD-10-CM | POA: Diagnosis not present

## 2021-06-21 DIAGNOSIS — R768 Other specified abnormal immunological findings in serum: Secondary | ICD-10-CM | POA: Diagnosis not present

## 2021-06-22 LAB — T4, FREE: Free T4: 1.26 ng/dL (ref 0.82–1.77)

## 2021-06-22 LAB — TSH: TSH: 3.87 u[IU]/mL (ref 0.450–4.500)

## 2021-06-22 LAB — T3, FREE: T3, Free: 3.8 pg/mL (ref 2.0–4.4)

## 2021-06-24 ENCOUNTER — Encounter: Payer: Self-pay | Admitting: Nurse Practitioner

## 2021-06-24 ENCOUNTER — Ambulatory Visit (INDEPENDENT_AMBULATORY_CARE_PROVIDER_SITE_OTHER): Payer: Medicare Other | Admitting: Nurse Practitioner

## 2021-06-24 VITALS — BP 122/85 | HR 69 | Ht 66.25 in | Wt 136.0 lb

## 2021-06-24 DIAGNOSIS — E042 Nontoxic multinodular goiter: Secondary | ICD-10-CM

## 2021-06-24 DIAGNOSIS — R7989 Other specified abnormal findings of blood chemistry: Secondary | ICD-10-CM

## 2021-06-24 DIAGNOSIS — R768 Other specified abnormal immunological findings in serum: Secondary | ICD-10-CM

## 2021-06-24 NOTE — Progress Notes (Signed)
06/24/2021     Endocrinology Follow Up Note    Subjective:    Patient ID: [name], female    DOB: 22-Nov-1955, PCP Eugenia Pancoast, FNP.   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   DORSAL COMPARTMENT RELEASE Right 02/07/2013   Procedure: RIGHT WRIST DEQUERVAIN RELEASE;  Surgeon: Jolyn Nap, MD;  Location: [facility];  Service: Orthopedics;  Laterality: Right;   KIDNEY DONATION  2008   gave her lt kidney to daughter   NECK MASS EXCISION     rt-mass   [place]   parotidectomy, one gland removed   SHOULDER ARTHROSCOPY WITH OPEN ROTATOR CUFF REPAIR AND DISTAL CLAVICLE ACROMINECTOMY Right 04/25/2014   Procedure: SHOULDER ARTHROSCOPY WITH MINI-OPEN ROTATOR CUFF REPAIR AND DISTAL CLAVICLE RESECTION;  Surgeon: Garald Balding, MD;  Location: [facility];  Service: Orthopedics;  Laterality: Right;   SHOULDER CLOSED REDUCTION Right 08/31/2014   Procedure: CLOSED MANIPULATION SHOULDER;  Surgeon: Garald Balding, MD;  Location: [facility];  Service: Orthopedics;  Laterality: Right;   SUBACROMIAL DECOMPRESSION Right 04/25/2014   Procedure: SUBACROMIAL DECOMPRESSION;  Surgeon: Garald Balding, MD;  Location: [facility];  Service: Orthopedics;  Laterality: Right;   THYROID SURGERY     bx right thyroid    Social History   Socioeconomic History   Marital status: Single    Spouse name: Not on file   Number of children: 5   Years of  education: Not on file   Highest education level: Not on file  Occupational History   Occupation: Disabled    Employer: Therapist, occupational    Comment: assembly line  Tobacco Use   Smoking status: Former    Packs/day: 0.25    Years: 54.00    Total pack years: 13.50    Types: Cigarettes, E-cigarettes    Quit date: 10/25/2014    Years since quitting: 6.6   Smokeless tobacco: Never  Vaping Use   Vaping Use: Never used  Substance and Sexual Activity   Alcohol use: No   Drug use: No   Sexual activity: Not Currently  Other Topics Concern   Not on file  Social History Narrative   Pt donated kidney to daughter 5 yrs ago.   Lives with son and his family   Right Handed    Drinks 3-4 cups caffeine daily      Social Determinants of Health   Financial Resource Strain: Not on file  Food Insecurity: Not on file  Transportation Needs: Not on file  Physical Activity: Not on file  Stress: Not on file  Social Connections: Not on file    Family History  Problem Relation Age of Onset   Hypertension Mother    Heart disease Mother        Irregular heart beat  Arrhythmia Mother    Rheum arthritis Mother    Pulmonary fibrosis Mother    Osteoporosis Mother    Lung cancer Father    COPD Father    Pulmonary fibrosis Father    Hypertension Sister    Hypertension Sister    Aneurysm Brother 67       brain   Heart disease Maternal Grandmother    Heart disease Maternal Aunt    Pulmonary fibrosis Maternal Aunt    Rheum arthritis Maternal Uncle    Pulmonary fibrosis Maternal Uncle    Diabetes Maternal Uncle    Colon cancer Neg Hx    Colon polyps Neg Hx     Outpatient Encounter Medications as of 06/24/2021  Medication Sig   vitamin B-12 (CYANOCOBALAMIN) 1000 MCG tablet Take 1,000 mcg by mouth daily.   acetaminophen (TYLENOL) 325 MG tablet Take 2 tablets (650 mg total) by mouth every 4 (four) hours as needed for mild pain (or temp > 37.5 C (99.5 F)).   amLODipine (NORVASC) 5 MG tablet Take 5 mg  by mouth daily.    aspirin EC 81 MG EC tablet Take 1 tablet (81 mg total) by mouth daily. Swallow whole.   atorvastatin (LIPITOR) 40 MG tablet Take 40 mg by mouth every other day.   fluticasone (FLONASE) 50 MCG/ACT nasal spray Place 2 sprays into both nostrils daily. (Patient taking differently: Place 2 sprays into both nostrils as needed.)   [DISCONTINUED] benzonatate (TESSALON) 200 MG capsule Take 1 capsule (200 mg total) by mouth 3 (three) times daily as needed for cough.   [DISCONTINUED] levocetirizine (XYZAL) 5 MG tablet Take 1 tablet (5 mg total) by mouth every evening.   [DISCONTINUED] montelukast (SINGULAIR) 10 MG tablet Take 1 tablet (10 mg total) by mouth at bedtime.   No facility-administered encounter medications on file as of 06/24/2021.    ALLERGIES: Allergies  Allergen Reactions   Celebrex [Celecoxib] Anaphylaxis and Hives   Sulfonamide Derivatives Anaphylaxis, Swelling and Rash   Ace Inhibitors Cough   Dilaudid [Hydromorphone] Nausea And Vomiting    Severe N/V   Erythromycin Nausea And Vomiting   Lactose Intolerance (Gi) Diarrhea    cramping   Sudafed [Pseudoephedrine Hcl]     Makes "high"   Losartan Palpitations   Mucinex [Guaifenesin Er] Palpitations    VACCINATION STATUS: Immunization History  Administered Date(s) Administered   Influenza Whole 10/15/2006   Influenza,inj,Quad PF,6+ Mos 09/19/2013, 10/02/2014   Pneumococcal Conjugate-13 09/19/2013   Pneumococcal-Unspecified 01/07/2008   Tdap 01/07/2008     HPI  [name] is 66 y.o. female who presents today with a medical history as above. she is being seen in consultation for hyperthyroidism requested by Eugenia Pancoast, [place].  she has been dealing with symptoms of cough, fatigue, tremors, hoarseness, dizziness, and unintentional weight loss intermittently since she had her stroke in 2021. These symptoms are progressively worsening and troubling to her.  her most recent thyroid labs revealed suppressed  TSH of 0.031 on 02/11/21 (corresponding Free T4 level was 1.59).  During her chart review, it is noted that she saw Endocrinology back in 2015, had thyroid ultrasound which showed multiple small thyroid nodules, none of which were suspicious for cancer but it also showed hypervascularity recommending uptake and scan if clinically relevant.  It appears she also had a thyroid biopsy in the past, although the specifics surrounding this aren't available.  Her TPO antibodies were positive at that time as well, indicating high likelihood of autoimmune dysfunction.  she  denies dysphagia, choking.  She does note voice change and gets SOB with exertion.    she does have family history of thyroid dysfunction in her sister and one of her nephews, but denies family hx of thyroid cancer.  she is not on any anti-thyroid medications nor on any thyroid hormone supplements. Denies use of Biotin containing supplements.  she is willing to proceed with appropriate work up and therapy for thyrotoxicosis.   Review of systems  Constitutional: + Minimally fluctuating body weight, current Body mass index is 21.79 kg/m., + fatigue, no subjective hyperthermia, no subjective hypothermia Eyes: + blurry vision, no xerophthalmia ENT:  no nodules palpated in throat, no dysphagia/odynophagia, no hoarseness Cardiovascular: no chest pain, intermittent shortness of breath-mostly with exertion, no palpitations, no leg swelling Respiratory: + intermittent shortness of breath-usually with exertion Gastrointestinal: no nausea/vomiting/diarrhea Musculoskeletal: no muscle/joint aches Skin: no rashes, no hyperemia Neurological: no tremors, no numbness, no tingling Psychiatric: no depression, no anxiety   Objective:    BP 122/85   Pulse 69   Ht 5' 6.25" (1.683 m)   Wt 136 lb (61.7 kg)   BMI 21.79 kg/m   Wt Readings from Last 3 Encounters:  06/24/21 136 lb (61.7 kg)  05/22/21 136 lb (61.7 kg)  04/16/21 134 lb 12.8 oz (61.1 kg)      BP Readings from Last 3 Encounters:  06/24/21 122/85  05/22/21 124/80  04/24/21 137/88                       Physical Exam- Limited  Constitutional:  Body mass index is 21.79 kg/m. , not in acute distress, normal state of mind Eyes:  EOMI, no exophthalmos Neck: Supple Thyroid: + gross goiter- left> right; surgical scar noted to right lateral neck from previous parotidectomy Cardiovascular: RRR, no murmurs, rubs, or gallops, no edema Respiratory: Adequate breathing efforts, no crackles, rales, rhonchi, or wheezing Musculoskeletal: no gross deformities, strength intact in all four extremities, no gross restriction of joint movements, left arm ataxia-from previous stroke Skin:  no rashes, no hyperemia Neurological: no tremor with outstretched hands   CMP     Component Value Date/Time   NA 141 05/22/2021 0857   K 3.6 05/22/2021 0857   CL 102 05/22/2021 0857   CO2 31 05/22/2021 0857   GLUCOSE 90 05/22/2021 0857   BUN 13 05/22/2021 0857   CREATININE 1.10 05/22/2021 0857   CREATININE 1.22 (H) 11/06/2015 1001   CALCIUM 10.0 05/22/2021 0857   CALCIUM 9.6 05/22/2021 0857   PROT 7.6 05/22/2021 0857   ALBUMIN 4.3 05/22/2021 0857   AST 22 05/22/2021 0857   ALT 16 05/22/2021 0857   ALKPHOS 114 05/22/2021 0857   BILITOT 0.6 05/22/2021 0857   GFRNONAA 52 (L) 11/03/2019 1647   GFRNONAA 56 (L) 06/19/2015 0821   GFRAA >60 09/20/2019 0426   GFRAA 64 06/19/2015 0821     CBC    Component Value Date/Time   WBC 5.5 05/22/2021 0857   RBC 4.82 05/22/2021 0857   HGB 14.2 05/22/2021 0857   HCT 42.7 05/22/2021 0857   PLT 256.0 05/22/2021 0857   MCV 88.5 05/22/2021 0857   MCH 29.3 11/03/2019 1647   MCHC 33.4 05/22/2021 0857   RDW 12.5 05/22/2021 0857   LYMPHSABS 1.5 05/22/2021 0857   MONOABS 0.4 05/22/2021 0857   EOSABS 0.2 05/22/2021 0857   BASOSABS 0.0 05/22/2021 0857     Diabetic Labs (most recent): Lab Results  Component Value Date  HGBA1C 5.3 09/19/2019   HGBA1C  5.5 01/18/2014   MICROALBUR 3.6 (H) 09/19/2013    Lipid Panel     Component Value Date/Time   CHOL 237 (H) 09/20/2019 0426   TRIG 82 09/20/2019 0426   HDL 68 09/20/2019 0426   CHOLHDL 3.5 09/20/2019 0426   VLDL 16 09/20/2019 0426   LDLCALC 153 (H) 09/20/2019 0426     Lab Results  Component Value Date   TSH 3.870 06/21/2021   TSH 8.880 (H) 03/26/2021   TSH 0.03 (A) 02/11/2021   TSH 1.09 10/02/2014   TSH 2.81 12/22/2013   TSH 13.89 (H) 09/19/2013   TSH 0.06 (L) 08/09/2013   FREET4 1.26 06/21/2021   FREET4 0.87 03/26/2021   FREET4 1.06 10/02/2014   FREET4 1.01 12/22/2013   FREET4 0.72 09/19/2013   FREET4 1.29 08/09/2013     Thyroid US from 09/26/2013 CLINICAL DATA:  Possible left thyroid nodule on physical  examination.   EXAM:  THYROID ULTRASOUND   TECHNIQUE:  Ultrasound examination of the thyroid gland and adjacent soft  tissues was performed.   COMPARISON:  Cervical spine CT 04/30/2003   FINDINGS:  Right thyroid lobe   Measurements: 5.4 x 1.7 x 1.8 cm. Right thyroid tissue is mildly  heterogeneous. There is an isoechoic nodule along the posterior  right thyroid lobe that measures 1.1 x 1.2 x 0.9 cm. Vascularity of  this nodule is similar to the adjacent thyroid parenchyma. No  significant calcifications within this right thyroid nodule.   Left thyroid lobe   Measurements: 6.7 x 1.3 x 1.9 cm. Left thyroid tissue is mildly  heterogeneous but very vascular. Small heterogeneous nodule in the  inferior left thyroid lobe measures 0.3 cm.   Isthmus   Thickness: 0.3 cm.  No nodules visualized.   Lymphadenopathy   None visualized.   IMPRESSION:  The thyroid tissue is mildly enlarged and hypervascular.   A solid nodule in the posterior right thyroid lobe measures up to  1.2 cm. Findings do not meet current SRU consensus criteria for  biopsy. Follow-up by clinical exam is recommended. If patient has  known risk factors for thyroid carcinoma, consider  follow-up  ultrasound in 12 months. If patient is clinically hyperthyroid,  consider nuclear medicine thyroid uptake and scan.Reference:  Management of Thyroid Nodules Detected at [place]: Society of  Radiologists in [place]. Radiology  2005; [id number].    Electronically Signed    By: Markus Daft M.D.    On: 09/26/2013 15:42     Latest Reference Range & Units 09/19/13 10:02 12/22/13 08:29 10/02/14 14:56 02/11/21 00:00 03/26/21 14:09  TSH 0.450 - 4.500 uIU/mL  2.81 1.09 0.03 ! (E) 8.880 (H)  Triiodothyronine,Free,Serum 2.0 - 4.4 pg/mL  3.2   2.9  Triiodothyronine (T3) 80.0 - 204.0 ng/dL 115.8      T4,Free(Direct) 0.82 - 1.77 ng/dL  1.01 1.06  0.87  Thyroperoxidase Ab SerPl-aCnc <9 IU/mL 26 (H)      !: Data is abnormal (H): Data is abnormally high (E): External lab result   Thyroid US from 04/09/21 CLINICAL DATA:  Goiter.   EXAM: THYROID ULTRASOUND   TECHNIQUE: Ultrasound examination of the thyroid gland and adjacent soft tissues was performed.   COMPARISON:  CT head and neck from 09/19/2019, thyroid ultrasound from 09/26/2013   FINDINGS: Parenchymal Echotexture: Mildly heterogenous   Isthmus: 0.4 cm, previously 0.3 cm   Right lobe: 4.6 x 1.5 x 1.6 cm, previously 5.4 x 1.7 x 1.8 cm  Left lobe: 4.7 x 1.3 x 1.7 cm, previously 6.7 x 1.3 x 1.9 cm   _________________________________________________________   Estimated total number of nodules >/= 1 cm: 1   Number of spongiform nodules >/=  2 cm not described below (TR1): 0   Number of mixed cystic and solid nodules >/= 1.5 cm not described below ([facility]): 0   _________________________________________________________   Nodule # 1:   Prior biopsy: No   Location: Right; Inferior   Maximum size: 2.0 cm; Other 2 dimensions: 1.3 x 1.4 cm, previously, 1.1 x 1.2 x 0.9 cm   Composition: solid/almost completely solid (2)   Echogenicity: hyperechoic (1)   Shape: not taller-than-wide (0)    Margins: extra-thyroidal extension (3)   Echogenic foci: none (0)   ACR TI-RADS total points: 6.   ACR TI-RADS risk category:  TR4 (4-6 points).   Significant change in size (>/= 20% in two dimensions and minimal increase of 2 mm): Yes   Change in features: No   Change in ACR TI-RADS risk category: No   ACR TI-RADS recommendations:   **Given size (>/= 1.5 cm) and appearance, fine needle aspiration of this moderately suspicious nodule should be considered based on TI-RADS criteria.   _________________________________________________________   No cervical lymphadenopathy.   IMPRESSION: Gradual interval enlargement of previously visualized right inferior solid thyroid nodule (labeled 1, 2.0 cm, previously 1.2 cm). Given enlargement, this nodule now technically meets criteria (TI-RADS category 4) for tissue sampling. Consider ultrasound-guided fine-needle aspiration.   The above is in keeping with the ACR TI-RADS recommendations - J Am Coll Radiol 2017;14:587-595.   Ruthann Cancer, MD   [facility]   [facility]     Electronically Signed   By: Ruthann Cancer M.D.   On: 04/09/2021 11:27    Latest Reference Range & Units 12/22/13 08:29 10/02/14 14:56 02/11/21 00:00 03/26/21 14:09 06/21/21 10:44  TSH 0.450 - 4.500 uIU/mL 2.81 1.09 0.03 ! (E) 8.880 (H) 3.870  Triiodothyronine,Free,Serum 2.0 - 4.4 pg/mL 3.2   2.9 3.8  T4,Free(Direct) 0.82 - 1.77 ng/dL 1.01 1.06  0.87 1.26  !: Data is abnormal (H): Data is abnormally high (E): External lab result        Assessment & Plan:   1. Abnormal TSH  she is being seen at a kind request of Eugenia Pancoast, FNP.  Her previsit thyroid function tests have returned to euthyroid presentation (after being hyperthyroid then hypothyroid).  Will repeat TFTs prior to next visit for surveillance.  2. Multinodular goiter Her follow up ultrasound shows significant change in size of a  previously noted nodule on right lower lobe, warranting FNA due to moderate suspicion of malignancy.  She agreed to proceed with this.    Her pathology returned showing the nodule was benign, ruling out malignancy.  She does not need continued routine thyroid ultrasound at this time.   -Patient is advised to maintain close follow up with Eugenia Pancoast, FNP for primary care needs.     I spent 20 minutes in the care of the patient today including review of labs from Thyroid Function, CMP, and other relevant labs ; imaging/biopsy records (current and previous including abstractions from other facilities); face-to-face time discussing  her lab results and symptoms, medications doses, her options of short and long term treatment based on the latest standards of care / guidelines;   and documenting the encounter.  [name]  participated in the discussions, expressed understanding, and voiced agreement with the  above plans.  All questions were answered to her satisfaction. she is encouraged to contact clinic should she have any questions or concerns prior to her return visit.    Follow up plan: Return in about 4 months (around 10/24/2021) for Thyroid follow up, Previsit labs.   Thank you for involving me in the care of this pleasant patient, and I will continue to update you with her progress.    Rayetta Pigg, [facility] [address] [place], [place] [address] Phone: [phone] Fax: [phone]  06/24/2021, 11:21 AM

## 2021-06-27 DIAGNOSIS — H269 Unspecified cataract: Secondary | ICD-10-CM | POA: Diagnosis not present

## 2021-06-27 DIAGNOSIS — H2511 Age-related nuclear cataract, right eye: Secondary | ICD-10-CM | POA: Diagnosis not present

## 2021-08-19 ENCOUNTER — Telehealth: Payer: Self-pay | Admitting: Family

## 2021-08-19 NOTE — Telephone Encounter (Signed)
Patient called In stating that she has mouth sores,that has been an ongoing issue for her. I made her an appointment to see tabitha in the morning ,since she has not been seen by her for this issue,only by her previous doctor. She would like to know is there anything she can do today to help her get some relief before her appointment tomorrow. Please advise.

## 2021-08-20 ENCOUNTER — Ambulatory Visit: Payer: Medicare Other | Admitting: Family

## 2021-08-20 NOTE — Telephone Encounter (Signed)
noted 

## 2021-08-29 DIAGNOSIS — M1612 Unilateral primary osteoarthritis, left hip: Secondary | ICD-10-CM | POA: Diagnosis not present

## 2021-08-29 DIAGNOSIS — M25552 Pain in left hip: Secondary | ICD-10-CM | POA: Diagnosis not present

## 2021-09-03 DIAGNOSIS — H35351 Cystoid macular degeneration, right eye: Secondary | ICD-10-CM | POA: Diagnosis not present

## 2021-10-09 ENCOUNTER — Telehealth: Payer: Self-pay | Admitting: Adult Health

## 2021-10-09 NOTE — Telephone Encounter (Signed)
Patient had a stroke in 2021 with residual left-sided numbness/tingling. As her stroke was 2 years ago, these deficits are likely permanent which we have discussed at previous visits.  Unfortunately, there is not much that we can do to help with the residual numbness and tingling sensation.  Sometimes medications can help but only with the painful sensations which she denied having.  Please advise patient.  Thank you.

## 2021-10-09 NOTE — Telephone Encounter (Signed)
Pt called wanting to know if there are any suggestions on how to get rid of pins and needles and numbness in her face that she has been experiencing since her stroke in 2021.

## 2021-10-17 ENCOUNTER — Telehealth: Payer: Self-pay | Admitting: Family

## 2021-10-17 DIAGNOSIS — R7989 Other specified abnormal findings of blood chemistry: Secondary | ICD-10-CM | POA: Diagnosis not present

## 2021-10-17 DIAGNOSIS — R768 Other specified abnormal immunological findings in serum: Secondary | ICD-10-CM | POA: Diagnosis not present

## 2021-10-17 DIAGNOSIS — E042 Nontoxic multinodular goiter: Secondary | ICD-10-CM | POA: Diagnosis not present

## 2021-10-17 NOTE — Telephone Encounter (Signed)
Pt called stating she is vitamin B-12 (CYANOCOBALAMIN) 1000 MCG tablet & she has bad diarrhea from meds. Pt is asking is their a liquid form of b12 she can take? Pt states she can't stand to continue the meds due to side effects. Call back #  [phone]

## 2021-10-17 NOTE — Telephone Encounter (Signed)
Pt can take sublingual over the counter this is liquid form b12

## 2021-10-18 LAB — T4, FREE: Free T4: 1.23 ng/dL (ref 0.82–1.77)

## 2021-10-18 LAB — T3, FREE: T3, Free: 3.1 pg/mL (ref 2.0–4.4)

## 2021-10-18 LAB — TSH: TSH: 3.12 u[IU]/mL (ref 0.450–4.500)

## 2021-10-21 NOTE — Telephone Encounter (Signed)
Left message to return call to our office.  

## 2021-10-21 NOTE — Telephone Encounter (Signed)
Pt states she can not take any B-12 that is it all upsetting her stomach. She has tired the tablets and liquid. Pt wanted me to let you know that when she goes up or down the stairs that's she feels fatique. I did offer her an appt to see you, but she is trying to wait to see endo and see what they say.

## 2021-10-21 NOTE — Telephone Encounter (Signed)
Has pt ever had b12 injections and tolerated well? She can hold off and wait and or we can do b12 injection once monthly x three months

## 2021-10-21 NOTE — Telephone Encounter (Signed)
Nurse visit is scheduled for this week B-12. She said that she has b-12  injection before and she tolerated fine.

## 2021-10-23 ENCOUNTER — Ambulatory Visit (INDEPENDENT_AMBULATORY_CARE_PROVIDER_SITE_OTHER): Payer: Medicare Other

## 2021-10-23 DIAGNOSIS — Z23 Encounter for immunization: Secondary | ICD-10-CM

## 2021-10-23 DIAGNOSIS — E538 Deficiency of other specified B group vitamins: Secondary | ICD-10-CM

## 2021-10-23 MED ORDER — CYANOCOBALAMIN 1000 MCG/ML IJ SOLN
1000.0000 ug | Freq: Once | INTRAMUSCULAR | Status: AC
  Administered 2021-10-23: 1000 ug via INTRAMUSCULAR

## 2021-10-23 NOTE — Telephone Encounter (Signed)
noted 

## 2021-10-23 NOTE — Progress Notes (Signed)
Patient presented for B 12 injection given by Sherrilee Gilles, CMA to right deltoid, patient voiced no concerns nor showed any signs of distress during injection.  Patient also requested her flu shot today. High dose flu shot given in left deltoid.

## 2021-10-24 ENCOUNTER — Encounter: Payer: Self-pay | Admitting: Nurse Practitioner

## 2021-10-24 ENCOUNTER — Ambulatory Visit (INDEPENDENT_AMBULATORY_CARE_PROVIDER_SITE_OTHER): Payer: Medicare Other | Admitting: Nurse Practitioner

## 2021-10-24 VITALS — BP 130/84 | HR 97 | Ht 67.0 in | Wt 133.6 lb

## 2021-10-24 DIAGNOSIS — E042 Nontoxic multinodular goiter: Secondary | ICD-10-CM | POA: Diagnosis not present

## 2021-10-24 DIAGNOSIS — R768 Other specified abnormal immunological findings in serum: Secondary | ICD-10-CM | POA: Diagnosis not present

## 2021-10-24 NOTE — Progress Notes (Signed)
10/24/2021     Endocrinology Follow Up Note    Subjective:    Patient ID: [name], female    DOB: 1955/07/07, PCP Eugenia Pancoast, FNP.   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   DORSAL COMPARTMENT RELEASE Right 02/07/2013   Procedure: RIGHT WRIST DEQUERVAIN RELEASE;  Surgeon: Jolyn Nap, MD;  Location: [facility];  Service: Orthopedics;  Laterality: Right;   KIDNEY DONATION  2008   gave her lt kidney to daughter   NECK MASS EXCISION     rt-mass   [facility]   parotidectomy, one gland removed   SHOULDER ARTHROSCOPY WITH OPEN ROTATOR CUFF REPAIR AND DISTAL CLAVICLE ACROMINECTOMY Right 04/25/2014   Procedure: SHOULDER ARTHROSCOPY WITH MINI-OPEN ROTATOR CUFF REPAIR AND DISTAL CLAVICLE RESECTION;  Surgeon: Garald Balding, MD;  Location: [place];  Service: Orthopedics;  Laterality: Right;   SHOULDER CLOSED REDUCTION Right 08/31/2014   Procedure: CLOSED MANIPULATION SHOULDER;  Surgeon: Garald Balding, MD;  Location: [place];  Service: Orthopedics;  Laterality: Right;   SUBACROMIAL DECOMPRESSION Right 04/25/2014   Procedure: SUBACROMIAL DECOMPRESSION;  Surgeon: Garald Balding, MD;  Location: [place];  Service: Orthopedics;  Laterality: Right;   THYROID SURGERY     bx right thyroid    Social History   Socioeconomic History   Marital status: Single    Spouse name: Not on file   Number of children: 5   Years of  education: Not on file   Highest education level: Not on file  Occupational History   Occupation: Disabled    Employer: Therapist, occupational    Comment: assembly line  Tobacco Use   Smoking status: Former    Packs/day: 0.25    Years: 54.00    Total pack years: 13.50    Types: Cigarettes, E-cigarettes    Quit date: 10/25/2014    Years since quitting: 7.0   Smokeless tobacco: Never  Vaping Use   Vaping Use: Never used  Substance and Sexual Activity   Alcohol use: No   Drug use: No   Sexual activity: Not Currently  Other Topics Concern   Not on file  Social History Narrative   Pt donated kidney to daughter 5 yrs ago.   Lives with son and his family   Right Handed    Drinks 3-4 cups caffeine daily      Social Determinants of Health   Financial Resource Strain: Not on file  Food Insecurity: Not on file  Transportation Needs: Not on file  Physical Activity: Not on file  Stress: Not on file  Social Connections: Not on file    Family History  Problem Relation Age of Onset   Hypertension Mother    Heart disease Mother        Irregular heart beat  Arrhythmia Mother    Rheum arthritis Mother    Pulmonary fibrosis Mother    Osteoporosis Mother    Lung cancer Father    COPD Father    Pulmonary fibrosis Father    Hypertension Sister    Hypertension Sister    Aneurysm Brother 52       brain   Heart disease Maternal Grandmother    Heart disease Maternal Aunt    Pulmonary fibrosis Maternal Aunt    Rheum arthritis Maternal Uncle    Pulmonary fibrosis Maternal Uncle    Diabetes Maternal Uncle    Colon cancer Neg Hx    Colon polyps Neg Hx     Outpatient Encounter Medications as of 10/24/2021  Medication Sig   acetaminophen (TYLENOL) 325 MG tablet Take 2 tablets (650 mg total) by mouth every 4 (four) hours as needed for mild pain (or temp > 37.5 C (99.5 F)).   amLODipine (NORVASC) 5 MG tablet Take 5 mg by mouth daily.    aspirin EC 81 MG EC tablet Take 1 tablet (81 mg total) by  mouth daily. Swallow whole.   atorvastatin (LIPITOR) 40 MG tablet Take 40 mg by mouth every other day.   cyanocobalamin (VITAMIN B12) 1000 MCG/ML injection Inject 1,000 mcg into the muscle once. Patient's first dose was 10/23/2021   fluticasone (FLONASE) 50 MCG/ACT nasal spray Place 2 sprays into both nostrils daily. (Patient taking differently: Place 2 sprays into both nostrils as needed.)   vitamin B-12 (CYANOCOBALAMIN) 1000 MCG tablet Take 1,000 mcg by mouth daily. (Patient not taking: Reported on 10/24/2021)   No facility-administered encounter medications on file as of 10/24/2021.    ALLERGIES: Allergies  Allergen Reactions   Celebrex [Celecoxib] Anaphylaxis and Hives   Sulfonamide Derivatives Anaphylaxis, Swelling and Rash   Ace Inhibitors Cough   Dilaudid [Hydromorphone] Nausea And Vomiting    Severe N/V   Erythromycin Nausea And Vomiting   Lactose Intolerance (Gi) Diarrhea    cramping   Sudafed [Pseudoephedrine Hcl]     Makes "high"   Losartan Palpitations   Mucinex [Guaifenesin Er] Palpitations    VACCINATION STATUS: Immunization History  Administered Date(s) Administered   Fluad Quad(high Dose 65+) 10/23/2021   Influenza Whole 10/15/2006   Influenza,inj,Quad PF,6+ Mos 09/19/2013, 10/02/2014   Pneumococcal Conjugate-13 09/19/2013   Pneumococcal-Unspecified 01/07/2008   Tdap 01/07/2008     HPI  [name] is 66 y.o. female who presents today with a medical history as above. she is being seen in consultation for hyperthyroidism requested by Eugenia Pancoast, [place].  she has been dealing with symptoms of cough, fatigue, tremors, hoarseness, dizziness, and unintentional weight loss intermittently since she had her stroke in 2021. These symptoms are progressively worsening and troubling to her.  her most recent thyroid labs revealed suppressed TSH of 0.031 on 02/11/21 (corresponding Free T4 level was 1.59).  During her chart review, it is noted that she saw  Endocrinology back in 2015, had thyroid ultrasound which showed multiple small thyroid nodules, none of which were suspicious for cancer but it also showed hypervascularity recommending uptake and scan if clinically relevant.  It appears she also had a thyroid biopsy in the past, although the specifics surrounding this aren't available.  Her TPO antibodies were positive at that time as well, indicating high likelihood of autoimmune dysfunction.  she denies dysphagia, choking.  She does note voice change and gets SOB with exertion.    she does have family history of thyroid dysfunction in her  sister and one of her nephews, but denies family hx of thyroid cancer.  she is not on any anti-thyroid medications nor on any thyroid hormone supplements. Denies use of Biotin containing supplements.  she is willing to proceed with appropriate work up and therapy for thyrotoxicosis.   Review of systems  Constitutional: + Minimally fluctuating body weight, current Body mass index is 20.92 kg/m., no fatigue, no subjective hyperthermia, no subjective hypothermia Eyes: + blurry vision, no xerophthalmia ENT:  no nodules palpated in throat, no dysphagia/odynophagia, no hoarseness, intermittent soreness of right lower neck (previous thyroid nodule biopsy site) Cardiovascular: no chest pain, intermittent shortness of breath-mostly with exertion, no palpitations, no leg swelling Respiratory: + intermittent shortness of breath-usually with exertion Gastrointestinal: no nausea/vomiting/diarrhea Musculoskeletal: no muscle/joint aches Skin: no rashes, no hyperemia Neurological: no tremors, no numbness, no tingling Psychiatric: no depression, no anxiety   Objective:    BP 130/84 (BP Location: Right Arm, Patient Position: Sitting, Cuff Size: Normal)   Pulse 97   Ht '5\' 7"'$  (1.702 m)   Wt 133 lb 9.6 oz (60.6 kg)   BMI 20.92 kg/m   Wt Readings from Last 3 Encounters:  10/24/21 133 lb 9.6 oz (60.6 kg)  06/24/21 136  lb (61.7 kg)  05/22/21 136 lb (61.7 kg)     BP Readings from Last 3 Encounters:  10/24/21 130/84  06/24/21 122/85  05/22/21 124/80                       Physical Exam- Limited  Constitutional:  Body mass index is 20.92 kg/m. , not in acute distress, normal state of mind Eyes:  EOMI, no exophthalmos Neck: Supple Thyroid: + gross goiter- left> right; surgical scar noted to right lateral neck from previous parotidectomy Cardiovascular: RRR, no murmurs, rubs, or gallops, no edema Respiratory: Adequate breathing efforts, no crackles, rales, rhonchi, or wheezing Musculoskeletal: no gross deformities, strength intact in all four extremities, no gross restriction of joint movements, left arm ataxia-from previous stroke Skin:  no rashes, no hyperemia Neurological: no tremor with outstretched hands   CMP     Component Value Date/Time   NA 141 05/22/2021 0857   K 3.6 05/22/2021 0857   CL 102 05/22/2021 0857   CO2 31 05/22/2021 0857   GLUCOSE 90 05/22/2021 0857   BUN 13 05/22/2021 0857   CREATININE 1.10 05/22/2021 0857   CREATININE 1.22 (H) 11/06/2015 1001   CALCIUM 10.0 05/22/2021 0857   CALCIUM 9.6 05/22/2021 0857   PROT 7.6 05/22/2021 0857   ALBUMIN 4.3 05/22/2021 0857   AST 22 05/22/2021 0857   ALT 16 05/22/2021 0857   ALKPHOS 114 05/22/2021 0857   BILITOT 0.6 05/22/2021 0857   GFRNONAA 52 (L) 11/03/2019 1647   GFRNONAA 56 (L) 06/19/2015 0821   GFRAA >60 09/20/2019 0426   GFRAA 64 06/19/2015 0821     CBC    Component Value Date/Time   WBC 5.5 05/22/2021 0857   RBC 4.82 05/22/2021 0857   HGB 14.2 05/22/2021 0857   HCT 42.7 05/22/2021 0857   PLT 256.0 05/22/2021 0857   MCV 88.5 05/22/2021 0857   MCH 29.3 11/03/2019 1647   MCHC 33.4 05/22/2021 0857   RDW 12.5 05/22/2021 0857   LYMPHSABS 1.5 05/22/2021 0857   MONOABS 0.4 05/22/2021 0857   EOSABS 0.2 05/22/2021 0857   BASOSABS 0.0 05/22/2021 0857     Diabetic Labs (most recent): Lab Results  Component Value  Date   HGBA1C 5.3 09/19/2019  HGBA1C 5.5 01/18/2014   MICROALBUR 3.6 (H) 09/19/2013    Lipid Panel     Component Value Date/Time   CHOL 237 (H) 09/20/2019 0426   TRIG 82 09/20/2019 0426   HDL 68 09/20/2019 0426   CHOLHDL 3.5 09/20/2019 0426   VLDL 16 09/20/2019 0426   LDLCALC 153 (H) 09/20/2019 0426     Lab Results  Component Value Date   TSH 3.120 10/17/2021   TSH 3.870 06/21/2021   TSH 8.880 (H) 03/26/2021   TSH 0.03 (A) 02/11/2021   TSH 1.09 10/02/2014   TSH 2.81 12/22/2013   TSH 13.89 (H) 09/19/2013   TSH 0.06 (L) 08/09/2013   FREET4 1.23 10/17/2021   FREET4 1.26 06/21/2021   FREET4 0.87 03/26/2021   FREET4 1.06 10/02/2014   FREET4 1.01 12/22/2013   FREET4 0.72 09/19/2013   FREET4 1.29 08/09/2013     Thyroid US from 09/26/2013 CLINICAL DATA:  Possible left thyroid nodule on physical  examination.   EXAM:  THYROID ULTRASOUND   TECHNIQUE:  Ultrasound examination of the thyroid gland and adjacent soft  tissues was performed.   COMPARISON:  Cervical spine CT 04/30/2003   FINDINGS:  Right thyroid lobe   Measurements: 5.4 x 1.7 x 1.8 cm. Right thyroid tissue is mildly  heterogeneous. There is an isoechoic nodule along the posterior  right thyroid lobe that measures 1.1 x 1.2 x 0.9 cm. Vascularity of  this nodule is similar to the adjacent thyroid parenchyma. No  significant calcifications within this right thyroid nodule.   Left thyroid lobe   Measurements: 6.7 x 1.3 x 1.9 cm. Left thyroid tissue is mildly  heterogeneous but very vascular. Small heterogeneous nodule in the  inferior left thyroid lobe measures 0.3 cm.   Isthmus   Thickness: 0.3 cm.  No nodules visualized.   Lymphadenopathy   None visualized.   IMPRESSION:  The thyroid tissue is mildly enlarged and hypervascular.   A solid nodule in the posterior right thyroid lobe measures up to  1.2 cm. Findings do not meet current SRU consensus criteria for  biopsy. Follow-up by clinical  exam is recommended. If patient has  known risk factors for thyroid carcinoma, consider follow-up  ultrasound in 12 months. If patient is clinically hyperthyroid,  consider nuclear medicine thyroid uptake and scan.Reference:  Management of Thyroid Nodules Detected at [place]: Society of  Radiologists in [place]. Radiology  2005; [id number].    Electronically Signed    By: Markus Daft M.D.    On: 09/26/2013 15:42       Thyroid US from 04/09/21 CLINICAL DATA:  Goiter.   EXAM: THYROID ULTRASOUND   TECHNIQUE: Ultrasound examination of the thyroid gland and adjacent soft tissues was performed.   COMPARISON:  CT head and neck from 09/19/2019, thyroid ultrasound from 09/26/2013   FINDINGS: Parenchymal Echotexture: Mildly heterogenous   Isthmus: 0.4 cm, previously 0.3 cm   Right lobe: 4.6 x 1.5 x 1.6 cm, previously 5.4 x 1.7 x 1.8 cm   Left lobe: 4.7 x 1.3 x 1.7 cm, previously 6.7 x 1.3 x 1.9 cm   _________________________________________________________   Estimated total number of nodules >/= 1 cm: 1   Number of spongiform nodules >/=  2 cm not described below (TR1): 0   Number of mixed cystic and solid nodules >/= 1.5 cm not described below ([place]): 0   _________________________________________________________   Nodule # 1:   Prior biopsy: No   Location: Right; Inferior   Maximum size:  2.0 cm; Other 2 dimensions: 1.3 x 1.4 cm, previously, 1.1 x 1.2 x 0.9 cm   Composition: solid/almost completely solid (2)   Echogenicity: hyperechoic (1)   Shape: not taller-than-wide (0)   Margins: extra-thyroidal extension (3)   Echogenic foci: none (0)   ACR TI-RADS total points: 6.   ACR TI-RADS risk category:  TR4 (4-6 points).   Significant change in size (>/= 20% in two dimensions and minimal increase of 2 mm): Yes   Change in features: No   Change in ACR TI-RADS risk category: No   ACR TI-RADS recommendations:   **Given size  (>/= 1.5 cm) and appearance, fine needle aspiration of this moderately suspicious nodule should be considered based on TI-RADS criteria.   _________________________________________________________   No cervical lymphadenopathy.   IMPRESSION: Gradual interval enlargement of previously visualized right inferior solid thyroid nodule (labeled 1, 2.0 cm, previously 1.2 cm). Given enlargement, this nodule now technically meets criteria (TI-RADS category 4) for tissue sampling. Consider ultrasound-guided fine-needle aspiration.   The above is in keeping with the ACR TI-RADS recommendations - J Am Coll Radiol 2017;14:587-595.   Ruthann Cancer, MD   [facility]   [facility]     Electronically Signed   By: Ruthann Cancer M.D.   On: 04/09/2021 11:27            Latest Reference Range & Units 10/02/14 14:56 02/11/21 00:00 03/26/21 14:09 06/21/21 10:44 10/17/21 10:18  TSH 0.450 - 4.500 uIU/mL 1.09 0.03 ! (E) 8.880 (H) 3.870 3.120  Triiodothyronine,Free,Serum 2.0 - 4.4 pg/mL   2.9 3.8 3.1  T4,Free(Direct) 0.82 - 1.77 ng/dL 1.06  0.87 1.26 1.23  !: Data is abnormal (H): Data is abnormally high (E): External lab result   Assessment & Plan:   1. Abnormal TSH 2. Positive thyroid antibodies 3. Multinodular goiter  she is being seen at a kind request of Eugenia Pancoast, FNP.  Her previsit thyroid function tests continue to show euthyroid presentation (after being hyperthyroid then hypothyroid).  Will repeat TFTs prior to next visit for surveillance.  Her follow up ultrasound from 04/09/21 shows significant change in size of a previously noted nodule on right lower lobe, warranting FNA due to moderate suspicion of malignancy.  Her pathology returned showing the nodule was benign, ruling out malignancy.  She does not need continued routine thyroid ultrasound at this time.   -Patient is advised to maintain close follow up with Eugenia Pancoast, FNP for primary care needs.     I spent 20 minutes in the care of the patient today including review of labs from Thyroid Function, CMP, and other relevant labs ; imaging/biopsy records (current and previous including abstractions from other facilities); face-to-face time discussing  her lab results and symptoms, medications doses, her options of short and long term treatment based on the latest standards of care / guidelines;   and documenting the encounter.  [name]  participated in the discussions, expressed understanding, and voiced agreement with the above plans.  All questions were answered to her satisfaction. she is encouraged to contact clinic should she have any questions or concerns prior to her return visit.    Follow up plan: Return in about 6 months (around 04/25/2022) for Thyroid follow up, Previsit labs.   Thank you for involving me in the care of this pleasant patient, and I will continue to update you with her progress.    Rayetta Pigg, FNP-BC [facility] [address]  [place],  [address] Phone: [phone] Fax: [phone]  10/24/2021, 11:48 AM

## 2021-10-26 DIAGNOSIS — K120 Recurrent oral aphthae: Secondary | ICD-10-CM | POA: Diagnosis not present

## 2021-11-18 ENCOUNTER — Telehealth: Payer: Self-pay | Admitting: Family

## 2021-11-18 NOTE — Telephone Encounter (Signed)
LVM for pt to rtn my call to schedule AWV-I with [facility] call back # [phone] 

## 2021-11-22 ENCOUNTER — Ambulatory Visit (INDEPENDENT_AMBULATORY_CARE_PROVIDER_SITE_OTHER): Payer: Medicare Other | Admitting: Family

## 2021-11-22 ENCOUNTER — Encounter: Payer: Self-pay | Admitting: Family

## 2021-11-22 VITALS — BP 132/76 | HR 86 | Temp 98.6°F | Resp 16 | Ht 67.0 in | Wt 140.0 lb

## 2021-11-22 DIAGNOSIS — I639 Cerebral infarction, unspecified: Secondary | ICD-10-CM

## 2021-11-22 DIAGNOSIS — I10 Essential (primary) hypertension: Secondary | ICD-10-CM | POA: Diagnosis not present

## 2021-11-22 DIAGNOSIS — E538 Deficiency of other specified B group vitamins: Secondary | ICD-10-CM | POA: Diagnosis not present

## 2021-11-22 DIAGNOSIS — J439 Emphysema, unspecified: Secondary | ICD-10-CM

## 2021-11-22 MED ORDER — AMLODIPINE BESYLATE 5 MG PO TABS: 5.0000 mg | ORAL_TABLET | Freq: Every day | ORAL | 1 refills | Status: DC

## 2021-11-22 MED ORDER — BREZTRI AEROSPHERE 160-9-4.8 MCG/ACT IN AERO: 2.0000 | INHALATION_SPRAY | Freq: Two times a day (BID) | RESPIRATORY_TRACT | 11 refills | Status: DC

## 2021-11-22 MED ORDER — CYANOCOBALAMIN 1000 MCG/ML IJ SOLN
1000.0000 ug | Freq: Once | INTRAMUSCULAR | Status: AC
  Administered 2021-11-22: 1000 ug via INTRAMUSCULAR

## 2021-11-22 NOTE — Assessment & Plan Note (Signed)
Asa 81 mg once daily

## 2021-11-22 NOTE — Assessment & Plan Note (Signed)
Stable  Continue amlodipine 5 mg once daily

## 2021-11-22 NOTE — Progress Notes (Signed)
Established Patient Office Visit  Subjective:  Patient ID: [name], female    DOB: 06/16/1955  Age: 66 y.o. MRN: [id number]  CC: No chief complaint on file.   HPI [name] is here today for follow up.   CVA: pt still taking daily baby aspirin 81 mg once daily.   HTN: still taking amlodipine 5 mg once daily.   Seasonal allergic rhinitis due to pollen: tried to start singulair 10 mg, however states made her sick to her stomach so she didn't keep taking. She is still taking xyzal and stopped flonase, can't stand how it goes down her throat.   Hypothyroid:followed by endo, next appt in April. Tsh trending down.    Feels a knot on the left side of her neck, hoarse always. She felt it last night. She denies any new nasal congestion or any sinus pressure. No sore throat. No ear pain.   ------------------------------------  mMRC dyspnea scale, asked in office, on a scale 0-4  0 I only get breathless with strenuous exercise  1 I get short of breath when hurrying on level ground or walking up a slight hill  2 on level ground, I walk slower than people of the same age because of breathlessness or have to stop for breath when walking my own pace  3 I stop for breath after walking about 100 yards or after a few minutes on level ground  4 I am too breathless to lave the house or I am breathless when dressing   Have you had 2 or more moderate exacerbations or at least 1 hospitalization for COPD exacerbation in the past year? No   Does the patient have high peripheral eosinophil levels (>300 ): no.    Past Medical History:  Diagnosis Date  . Allergy   . Anxiety   . Arthritis   . Chronic kidney disease    kidney donor, has only one kidney  . Hypertension   . 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 Migraines   . Stroke ([place]) 09/19/2019   Left side & vision  . Thyroid disease    Hx of     Past Surgical History:  Procedure Laterality Date  .  ABDOMINAL HYSTERECTOMY  1991   partial, still with right ovary  . BLADDER SURGERY     bladder tac, then bladder tack reversal due to adhesions.  . DORSAL COMPARTMENT RELEASE Right 02/07/2013   Procedure: RIGHT WRIST DEQUERVAIN RELEASE;  Surgeon: Jolyn Nap, MD;  Location: [facility];  Service: Orthopedics;  Laterality: Right;  . KIDNEY DONATION  2008   gave her lt kidney to daughter  . NECK MASS EXCISION     rt-mass  . NECK SURGERY  1990   parotidectomy, one gland removed  . SHOULDER ARTHROSCOPY WITH OPEN ROTATOR CUFF REPAIR AND DISTAL CLAVICLE ACROMINECTOMY Right 04/25/2014   Procedure: SHOULDER ARTHROSCOPY WITH MINI-OPEN ROTATOR CUFF REPAIR AND DISTAL CLAVICLE RESECTION;  Surgeon: Garald Balding, MD;  Location: [facility];  Service: Orthopedics;  Laterality: Right;  . SHOULDER CLOSED REDUCTION Right 08/31/2014   Procedure: CLOSED MANIPULATION SHOULDER;  Surgeon: Garald Balding, MD;  Location: [facility];  Service: Orthopedics;  Laterality: Right;  . SUBACROMIAL DECOMPRESSION Right 04/25/2014   Procedure: SUBACROMIAL DECOMPRESSION;  Surgeon: Garald Balding, MD;  Location: [facility];  Service: Orthopedics;  Laterality: Right;  . THYROID SURGERY     bx  right thyroid    Family History  Problem Relation Age of Onset  . Hypertension Mother   . Heart disease Mother        Irregular heart beat   . Arrhythmia Mother   . Rheum arthritis Mother   . Pulmonary fibrosis Mother   . Osteoporosis Mother   . Lung cancer Father   . COPD Father   . Pulmonary fibrosis Father   . Hypertension Sister   . Hypertension Sister   . Aneurysm Brother 11       brain  . Heart disease Maternal Grandmother   . Heart disease Maternal Aunt   . Pulmonary fibrosis Maternal Aunt   . Rheum arthritis Maternal Uncle   . Pulmonary fibrosis Maternal Uncle   . Diabetes Maternal Uncle   . Colon cancer Neg Hx   . Colon polyps Neg Hx      Social History   Socioeconomic History  . Marital status: Single    Spouse name: Not on file  . Number of children: 5  . Years of education: Not on file  . Highest education level: Not on file  Occupational History  . Occupation: Disabled    Employer: Therapist, occupational    Comment: assembly line  Tobacco Use  . Smoking status: Former    Packs/day: 0.25    Years: 54.00    Total pack years: 13.50    Types: Cigarettes, E-cigarettes    Quit date: 10/25/2014    Years since quitting: 7.0  . Smokeless tobacco: Never  Vaping Use  . Vaping Use: Never used  Substance and Sexual Activity  . Alcohol use: No  . Drug use: No  . Sexual activity: Not Currently  Other Topics Concern  . 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   Lives with son and his family   Right Handed    Drinks 3-4 cups caffeine daily      Social Determinants of Health   Financial Resource Strain: Not on file  Food Insecurity: Not on file  Transportation Needs: Not on file  Physical Activity: Not on file  Stress: Not on file  Social Connections: Not on file  Intimate Partner Violence: Not on file    Outpatient Medications Prior to Visit  Medication Sig Dispense Refill  . acetaminophen (TYLENOL) 325 MG tablet Take 2 tablets (650 mg total) by mouth every 4 (four) hours as needed for mild pain (or temp > 37.5 C (99.5 F)).    Marland Kitchen aspirin EC 81 MG EC tablet Take 1 tablet (81 mg total) by mouth daily. Swallow whole. 30 tablet 11  . atorvastatin (LIPITOR) 40 MG tablet Take 40 mg by mouth every other day.    . cyanocobalamin (VITAMIN B12) 1000 MCG/ML injection Inject 1,000 mcg into the muscle once. Patient's first dose was 10/23/2021    . vitamin B-12 (CYANOCOBALAMIN) 1000 MCG tablet Take 1,000 mcg by mouth daily.    Marland Kitchen amLODipine (NORVASC) 5 MG tablet Take 5 mg by mouth daily.     . fluticasone (FLONASE) 50 MCG/ACT nasal spray Place 2 sprays into both nostrils daily. (Patient taking  differently: Place 2 sprays into both nostrils as needed.)  2   No facility-administered medications prior to visit.    Allergies  Allergen Reactions  . Celebrex [Celecoxib] Anaphylaxis and Hives  . Sulfonamide Derivatives Anaphylaxis, Swelling and Rash  . Ace Inhibitors Cough  . Dilaudid [Hydromorphone] Nausea And Vomiting  Severe N/V  . Erythromycin Nausea And Vomiting  . Lactose Intolerance (Gi) Diarrhea    cramping  . Sudafed [Pseudoephedrine Hcl]     Makes "high"  . Losartan Palpitations  . Mucinex [Guaifenesin Er] Palpitations      Review of Systems  Respiratory:  Negative for shortness of breath.   Cardiovascular:  Negative for chest pain and palpitations.  Gastrointestinal:  Negative for constipation and diarrhea.  Genitourinary:  Negative for dysuria, frequency and urgency.  Musculoskeletal:  Negative for myalgias.  Psychiatric/Behavioral:  Negative for depression and suicidal ideas.   All other systems reviewed and are negative.    Objective:    Physical Exam Constitutional:      General: She is not in acute distress.    Appearance: Normal appearance. She is not ill-appearing.  HENT:     Right Ear: Tympanic membrane normal.     Left Ear: Tympanic membrane normal.     Nose: Nose normal. No congestion or rhinorrhea.     Right Turbinates: Not enlarged or swollen.     Left Turbinates: Not enlarged or swollen.     Right Sinus: No maxillary sinus tenderness or frontal sinus tenderness.     Left Sinus: No maxillary sinus tenderness or frontal sinus tenderness.     Mouth/Throat:     Mouth: Mucous membranes are moist.     Pharynx: No pharyngeal swelling, oropharyngeal exudate or posterior oropharyngeal erythema.     Tonsils: No tonsillar exudate.  Eyes:     Extraocular Movements: Extraocular movements intact.     Conjunctiva/sclera: Conjunctivae normal.     Pupils: Pupils are equal, round, and reactive to light.  Neck:     Thyroid: No thyroid mass.   Cardiovascular:     Rate and Rhythm: Normal rate and regular rhythm.  Pulmonary:     Effort: Pulmonary effort is normal.     Breath sounds: Normal breath sounds.  Lymphadenopathy:     Cervical:     Right cervical: No superficial cervical adenopathy.    Left cervical: No superficial cervical adenopathy.  Neurological:     General: No focal deficit present.     Mental Status: She is alert and oriented to person, place, and time. Mental status is at baseline.  Psychiatric:        Mood and Affect: Mood normal.        Behavior: Behavior normal.        Thought Content: Thought content normal.        Judgment: Judgment normal.     BP 132/76   Pulse 86   Temp 98.6 F (37 C)   Resp 16   Ht '5\' 7"'$  (1.702 m)   Wt 140 lb (63.5 kg)   SpO2 96%   BMI 21.93 kg/m  Wt Readings from Last 3 Encounters:  11/22/21 140 lb (63.5 kg)  10/24/21 133 lb 9.6 oz (60.6 kg)  06/24/21 136 lb (61.7 kg)     Health Maintenance Due  Topic Date Due  . COVID-19 Vaccine (1) Never done  . Hepatitis C Screening  Never done  . Zoster Vaccines- Shingrix (1 of 2) Never done  . Pneumonia Vaccine 57+ Years old (2 - PPSV23 or PCV20) 11/14/2013  . MAMMOGRAM  07/15/2017  . COLONOSCOPY (Pts 45-6yr Insurance coverage will need to be confirmed)  08/11/2018  . Medicare Annual Wellness (AWV)  06/03/2019  . DEXA SCAN  Never done    There are no preventive care reminders to display for this  patient.  Lab Results  Component Value Date   TSH 3.120 10/17/2021   Lab Results  Component Value Date   WBC 5.5 05/22/2021   HGB 14.2 05/22/2021   HCT 42.7 05/22/2021   MCV 88.5 05/22/2021   PLT 256.0 05/22/2021   Lab Results  Component Value Date   NA 141 05/22/2021   K 3.6 05/22/2021   CO2 31 05/22/2021   GLUCOSE 90 05/22/2021   BUN 13 05/22/2021   CREATININE 1.10 05/22/2021   BILITOT 0.6 05/22/2021   ALKPHOS 114 05/22/2021   AST 22 05/22/2021   ALT 16 05/22/2021   PROT 7.6 05/22/2021   ALBUMIN 4.3  05/22/2021   CALCIUM 10.0 05/22/2021   CALCIUM 9.6 05/22/2021   ANIONGAP 8 11/03/2019   GFR 52.27 (L) 05/22/2021   Lab Results  Component Value Date   CHOL 237 (H) 09/20/2019   Lab Results  Component Value Date   HDL 68 09/20/2019   Lab Results  Component Value Date   LDLCALC 153 (H) 09/20/2019   Lab Results  Component Value Date   TRIG 82 09/20/2019   Lab Results  Component Value Date   CHOLHDL 3.5 09/20/2019   Lab Results  Component Value Date   HGBA1C 5.3 09/19/2019      Assessment & Plan:   Problem List Items Addressed This Visit       Cardiovascular and Mediastinum   Essential hypertension, benign    Stable  Continue amlodipine 5 mg once daily       Relevant Medications   amLODipine (NORVASC) 5 MG tablet   CVA (cerebral vascular accident) ([place])    Asa 81 mg once daily        Relevant Medications   amLODipine (NORVASC) 5 MG tablet     Respiratory   Pulmonary emphysema (HCC) - Primary   Relevant Medications   Budeson-Glycopyrrol-Formoterol (BREZTRI AEROSPHERE) 160-9-4.8 MCG/ACT AERO   Other Visit Diagnoses     B12 deficiency       Relevant Medications   cyanocobalamin (VITAMIN B12) injection 1,000 mcg (Completed)       Meds ordered this encounter  Medications  . amLODipine (NORVASC) 5 MG tablet    Sig: Take 1 tablet (5 mg total) by mouth daily.    Dispense:  90 tablet    Refill:  1  . Budeson-Glycopyrrol-Formoterol (BREZTRI AEROSPHERE) 160-9-4.8 MCG/ACT AERO    Sig: Inhale 2 puffs into the lungs 2 (two) times daily.    Dispense:  10.7 g    Refill:  11    Order Specific Question:   Supervising Provider    Answer:   BEDSOLE, AMY E [[id number]]  . cyanocobalamin (VITAMIN B12) injection 1,000 mcg    Follow-up: Return in about 3 months (around 02/22/2022) for f/u copd .    Eugenia Pancoast, FNP

## 2021-11-26 ENCOUNTER — Encounter: Payer: Self-pay | Admitting: Family

## 2021-11-26 ENCOUNTER — Telehealth: Payer: Self-pay | Admitting: Family

## 2021-11-26 ENCOUNTER — Ambulatory Visit (INDEPENDENT_AMBULATORY_CARE_PROVIDER_SITE_OTHER): Payer: Medicare Other | Admitting: Family

## 2021-11-26 VITALS — BP 128/88 | HR 91 | Temp 97.9°F | Resp 16 | Ht 67.0 in | Wt 139.2 lb

## 2021-11-26 DIAGNOSIS — R7989 Other specified abnormal findings of blood chemistry: Secondary | ICD-10-CM

## 2021-11-26 DIAGNOSIS — R768 Other specified abnormal immunological findings in serum: Secondary | ICD-10-CM

## 2021-11-26 DIAGNOSIS — K130 Diseases of lips: Secondary | ICD-10-CM | POA: Diagnosis not present

## 2021-11-26 DIAGNOSIS — M2550 Pain in unspecified joint: Secondary | ICD-10-CM | POA: Diagnosis not present

## 2021-11-26 DIAGNOSIS — E538 Deficiency of other specified B group vitamins: Secondary | ICD-10-CM

## 2021-11-26 DIAGNOSIS — K121 Other forms of stomatitis: Secondary | ICD-10-CM

## 2021-11-26 DIAGNOSIS — B009 Herpesviral infection, unspecified: Secondary | ICD-10-CM

## 2021-11-26 LAB — CBC WITH DIFFERENTIAL/PLATELET
Basophils Absolute: 0.1 10*3/uL (ref 0.0–0.1)
Basophils Relative: 0.9 % (ref 0.0–3.0)
Eosinophils Absolute: 0.1 10*3/uL (ref 0.0–0.7)
Eosinophils Relative: 1.5 % (ref 0.0–5.0)
HCT: 43.1 % (ref 36.0–46.0)
Hemoglobin: 14.7 g/dL (ref 12.0–15.0)
Lymphocytes Relative: 23.8 % (ref 12.0–46.0)
Lymphs Abs: 1.4 10*3/uL (ref 0.7–4.0)
MCHC: 34.1 g/dL (ref 30.0–36.0)
MCV: 88.1 fl (ref 78.0–100.0)
Monocytes Absolute: 0.4 10*3/uL (ref 0.1–1.0)
Monocytes Relative: 6.1 % (ref 3.0–12.0)
Neutro Abs: 3.9 10*3/uL (ref 1.4–7.7)
Neutrophils Relative %: 67.7 % (ref 43.0–77.0)
Platelets: 229 10*3/uL (ref 150.0–400.0)
RBC: 4.89 Mil/uL (ref 3.87–5.11)
RDW: 12.7 % (ref 11.5–15.5)
WBC: 5.7 10*3/uL (ref 4.0–10.5)

## 2021-11-26 LAB — COMPREHENSIVE METABOLIC PANEL
ALT: 23 U/L (ref 0–35)
AST: 26 U/L (ref 0–37)
Albumin: 4.5 g/dL (ref 3.5–5.2)
Alkaline Phosphatase: 111 U/L (ref 39–117)
BUN: 14 mg/dL (ref 6–23)
CO2: 32 mEq/L (ref 19–32)
Calcium: 10 mg/dL (ref 8.4–10.5)
Chloride: 104 mEq/L (ref 96–112)
Creatinine, Ser: 1.02 mg/dL (ref 0.40–1.20)
GFR: 57.02 mL/min — ABNORMAL LOW (ref >60.00)
Glucose, Bld: 99 mg/dL (ref 70–99)
Potassium: 4.5 mEq/L (ref 3.5–5.1)
Sodium: 141 mEq/L (ref 135–145)
Total Bilirubin: 0.6 mg/dL (ref 0.2–1.2)
Total Protein: 7.2 g/dL (ref 6.0–8.3)

## 2021-11-26 LAB — VITAMIN B12: Vitamin B-12: 814 pg/mL (ref 211–911)

## 2021-11-26 MED ORDER — VALACYCLOVIR HCL 500 MG PO TABS: 500.0000 mg | ORAL_TABLET | Freq: Every day | ORAL | 1 refills | Status: DC

## 2021-11-26 MED ORDER — VALACYCLOVIR HCL 500 MG PO TABS: 500.0000 mg | ORAL_TABLET | Freq: Every day | ORAL | 0 refills | Status: AC

## 2021-11-26 NOTE — Assessment & Plan Note (Signed)
Viral culture to confirm Sending suppressive medication 500 mg once daily of valtrex

## 2021-11-26 NOTE — Patient Instructions (Addendum)
   I have created an order for lab work today during our visit.  Please schedule an appointment on your way out to return to the lab at your convenience. Please return fasting at your lab appointment (meaning you can only drink black coffee and or water prior to your appointment). I will reach out to you in regards to the labs when I receive the results.   Start daily valtrex 500 mg once daily  Stop by the lab prior to leaving today. I will notify you of your results once received.   Regards,  Eugenia Pancoast FNP-C

## 2021-11-26 NOTE — Telephone Encounter (Signed)
Pt called stating [facility] said how they couldn't refill the prescription due to needing more info about inhaler. Call back # [phone]

## 2021-11-26 NOTE — Assessment & Plan Note (Signed)
B12 injection today as can not tolerate otc b12

## 2021-11-26 NOTE — Progress Notes (Signed)
Established Patient Office Visit  Subjective:  Patient ID: [name], female    DOB: 11-21-1955  Age: 66 y.o. MRN: [id number]  CC:  Chief Complaint  Patient presents with   Blister    On lips having since July    HPI [name] is here today with concerns.   Recurrent blisters on lip and tongue over the last few months, pt states ongoing and reoccurring since June 2023.   Went to urgent care 10/26/21 and was cultured however they 'lost the sample' per patient. Was given rx for kenalog and valtrex however was advised not to take until culture was returned.  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   DORSAL COMPARTMENT RELEASE Right 02/07/2013   Procedure: RIGHT WRIST DEQUERVAIN RELEASE;  Surgeon: Jolyn Nap, MD;  Location: [facility];  Service: Orthopedics;  Laterality: Right;   KIDNEY DONATION  2008   gave her lt kidney to daughter   NECK MASS EXCISION     rt-mass   [place]   parotidectomy, one gland removed   SHOULDER ARTHROSCOPY WITH OPEN ROTATOR CUFF REPAIR AND DISTAL CLAVICLE ACROMINECTOMY Right 04/25/2014   Procedure: SHOULDER ARTHROSCOPY WITH MINI-OPEN ROTATOR CUFF REPAIR AND DISTAL CLAVICLE RESECTION;  Surgeon: Garald Balding, MD;  Location: [facility];  Service: Orthopedics;  Laterality: Right;   SHOULDER CLOSED REDUCTION Right 08/31/2014   Procedure:  CLOSED MANIPULATION SHOULDER;  Surgeon: Garald Balding, MD;  Location: [facility];  Service: Orthopedics;  Laterality: Right;   SUBACROMIAL DECOMPRESSION Right 04/25/2014   Procedure: SUBACROMIAL DECOMPRESSION;  Surgeon: Garald Balding, MD;  Location: [facility];  Service: Orthopedics;  Laterality: Right;   THYROID SURGERY     bx right thyroid    Family History  Problem Relation Age of Onset   Hypertension Mother    Heart disease Mother        Irregular heart beat    Arrhythmia Mother    Rheum arthritis Mother    Pulmonary fibrosis Mother    Osteoporosis Mother    Lung cancer Father    COPD Father    Pulmonary fibrosis Father    Hypertension Sister    Hypertension Sister    Aneurysm Brother 87       brain   Heart disease Maternal Grandmother    Heart disease Maternal Aunt    Pulmonary fibrosis Maternal Aunt    Rheum arthritis Maternal Uncle    Pulmonary fibrosis Maternal Uncle    Diabetes Maternal Uncle    Colon cancer Neg Hx    Colon polyps Neg Hx     Social History   Socioeconomic History   Marital status: Single    Spouse  name: Not on file   Number of children: 5   Years of education: Not on file   Highest education level: Not on file  Occupational History   Occupation: Disabled    Employer: [employer]    Comment: assembly line  Tobacco Use   Smoking status: Former    Packs/day: 0.25    Years: 54.00    Total pack years: 13.50    Types: Cigarettes, E-cigarettes    Quit date: 10/25/2014    Years since quitting: 7.0   Smokeless tobacco: Never  Vaping Use   Vaping Use: Never used  Substance and Sexual Activity   Alcohol use: No   Drug use: No   Sexual activity: Not Currently  Other Topics Concern   Not on file  Social History Narrative   Pt donated kidney to daughter 5 yrs ago.   Lives with son and his family   Right Handed    Drinks 3-4 cups caffeine daily      Social Determinants of Health   Financial Resource  Strain: Not on file  Food Insecurity: Not on file  Transportation Needs: Not on file  Physical Activity: Not on file  Stress: Not on file  Social Connections: Not on file  Intimate Partner Violence: Not on file    Outpatient Medications Prior to Visit  Medication Sig Dispense Refill   acetaminophen (TYLENOL) 325 MG tablet Take 2 tablets (650 mg total) by mouth every 4 (four) hours as needed for mild pain (or temp > 37.5 C (99.5 F)).     amLODipine (NORVASC) 5 MG tablet Take 1 tablet (5 mg total) by mouth daily. 90 tablet 1   aspirin EC 81 MG EC tablet Take 1 tablet (81 mg total) by mouth daily. Swallow whole. 30 tablet 11   atorvastatin (LIPITOR) 40 MG tablet Take 40 mg by mouth every other day.     Budeson-Glycopyrrol-Formoterol (BREZTRI AEROSPHERE) 160-9-4.8 MCG/ACT AERO Inhale 2 puffs into the lungs 2 (two) times daily. 10.7 g 11   cyanocobalamin (VITAMIN B12) 1000 MCG/ML injection Inject 1,000 mcg into the muscle once. Patient's first dose was 10/23/2021     vitamin B-12 (CYANOCOBALAMIN) 1000 MCG tablet Take 1,000 mcg by mouth daily. (Patient not taking: Reported on 11/26/2021)     No facility-administered medications prior to visit.    Allergies  Allergen Reactions   Celebrex [Celecoxib] Anaphylaxis and Hives   Sulfonamide Derivatives Anaphylaxis, Swelling and Rash   Ace Inhibitors Cough   Dilaudid [Hydromorphone] Nausea And Vomiting    Severe N/V   Erythromycin Nausea And Vomiting   Lactose Intolerance (Gi) Diarrhea    cramping   Sudafed [Pseudoephedrine Hcl]     Makes "high"   Losartan Palpitations   Mucinex [Guaifenesin Er] Palpitations        Objective:    Physical Exam Constitutional:      General: She is not in acute distress.    Appearance: Normal appearance. She is not ill-appearing.  HENT:     Right Ear: Tympanic membrane normal.     Left Ear: Tympanic membrane normal.     Nose: Nose normal. No congestion or rhinorrhea.     Right Turbinates: Not  enlarged or swollen.     Left Turbinates: Not enlarged or swollen.     Right Sinus: No maxillary sinus tenderness or frontal sinus tenderness.     Left Sinus: No maxillary sinus tenderness or frontal sinus tenderness.     Mouth/Throat:     Mouth: Mucous  membranes are moist. Oral lesions (right lower lip lesion with ulceration and base of erythema and edema) present.     Tongue: No lesions.     Pharynx: No pharyngeal swelling, oropharyngeal exudate or posterior oropharyngeal erythema.     Tonsils: No tonsillar exudate.  Eyes:     Extraocular Movements: Extraocular movements intact.     Conjunctiva/sclera: Conjunctivae normal.     Pupils: Pupils are equal, round, and reactive to light.  Neck:     Thyroid: No thyroid mass.  Cardiovascular:     Rate and Rhythm: Normal rate and regular rhythm.  Pulmonary:     Effort: Pulmonary effort is normal.     Breath sounds: Normal breath sounds.  Lymphadenopathy:     Cervical:     Right cervical: No superficial cervical adenopathy.    Left cervical: No superficial cervical adenopathy.  Neurological:     Mental Status: She is alert.     BP 128/88   Pulse 91   Temp 97.9 F (36.6 C)   Resp 16   Ht '5\' 7"'$  (1.702 m)   Wt 139 lb 4 oz (63.2 kg)   SpO2 95%   BMI 21.81 kg/m  Wt Readings from Last 3 Encounters:  11/26/21 139 lb 4 oz (63.2 kg)  11/22/21 140 lb (63.5 kg)  10/24/21 133 lb 9.6 oz (60.6 kg)     Health Maintenance Due  Topic Date Due   COVID-19 Vaccine (1) Never done   Hepatitis C Screening  Never done   Zoster Vaccines- Shingrix (1 of 2) Never done   Pneumonia Vaccine 69+ Years old (2 - PPSV23 or PCV20) 11/14/2013   MAMMOGRAM  07/15/2017   COLONOSCOPY (Pts 45-50yr Insurance coverage will need to be confirmed)  08/11/2018   Medicare Annual Wellness (AWV)  06/03/2019   DEXA SCAN  Never done    There are no preventive care reminders to display for this patient.  Lab Results  Component Value Date   TSH 3.120 10/17/2021    Lab Results  Component Value Date   WBC 5.5 05/22/2021   HGB 14.2 05/22/2021   HCT 42.7 05/22/2021   MCV 88.5 05/22/2021   PLT 256.0 05/22/2021   Lab Results  Component Value Date   NA 141 05/22/2021   K 3.6 05/22/2021   CO2 31 05/22/2021   GLUCOSE 90 05/22/2021   BUN 13 05/22/2021   CREATININE 1.10 05/22/2021   BILITOT 0.6 05/22/2021   ALKPHOS 114 05/22/2021   AST 22 05/22/2021   ALT 16 05/22/2021   PROT 7.6 05/22/2021   ALBUMIN 4.3 05/22/2021   CALCIUM 10.0 05/22/2021   CALCIUM 9.6 05/22/2021   ANIONGAP 8 11/03/2019   GFR 52.27 (L) 05/22/2021   Lab Results  Component Value Date   HGBA1C 5.3 09/19/2019      Assessment & Plan:   Problem List Items Addressed This Visit       Digestive   Recurrent stomatitis    Hiv and cbc ordered pending results       Relevant Medications   valACYclovir (VALTREX) 500 MG tablet   Other Relevant Orders   CBC with Differential   HIV antibody (with reflex)     Other   Polyarthralgia   Vitamin B 12 deficiency - Primary    Ordering b12 pending results.       Relevant Orders   Vitamin B12   HSV-1 (herpes simplex virus 1) infection    Viral culture to confirm Sending suppressive medication 500  mg once daily of valtrex       Relevant Medications   valACYclovir (VALTREX) 500 MG tablet   valACYclovir (VALTREX) 500 MG tablet   Other Relevant Orders   Herpes simplex virus culture   Elevated serum creatinine   Relevant Orders   Comprehensive metabolic panel   Elevated antinuclear antibody (ANA) level   Relevant Orders   ANA   Other Visit Diagnoses     Lip lesion           Meds ordered this encounter  Medications   DISCONTD: valACYclovir (VALTREX) 500 MG tablet    Sig: Take 1 tablet (500 mg total) by mouth daily.    Dispense:  90 tablet    Refill:  1    Order Specific Question:   Supervising Provider    Answer:   BEDSOLE, AMY E [[id number]]   valACYclovir (VALTREX) 500 MG tablet    Sig: Take 1 tablet (500 mg  total) by mouth daily.    Dispense:  30 tablet    Refill:  0    Order Specific Question:   Supervising Provider    Answer:   BEDSOLE, AMY E [[id number]]   valACYclovir (VALTREX) 500 MG tablet    Sig: Take 1 tablet (500 mg total) by mouth daily.    Dispense:  90 tablet    Refill:  1    Order Specific Question:   Supervising Provider    Answer:   Diona Browner, AMY E [[id number]]    Follow-up: Return in about 3 months (around 02/26/2022) for f/u , f/u blood pressure.    Eugenia Pancoast, FNP

## 2021-11-26 NOTE — Assessment & Plan Note (Signed)
Continue inhaler as prescribed.

## 2021-11-26 NOTE — Telephone Encounter (Signed)
Pt called in stated she need RX valACYclovir (VALTREX) 500 MG tablet to go to [facility] ([place]   for the refills . It was filled by [facility] today  .

## 2021-11-26 NOTE — Assessment & Plan Note (Signed)
Hiv and cbc ordered pending results

## 2021-11-26 NOTE — Assessment & Plan Note (Signed)
Ordering b12 pending results 

## 2021-11-29 LAB — HIV ANTIBODY (ROUTINE TESTING W REFLEX): HIV 1&2 Ab, 4th Generation: NONREACTIVE

## 2021-11-29 LAB — HERPES SIMPLEX VIRUS CULTURE
MICRO NUMBER:: 14218943
SPECIMEN QUALITY:: ADEQUATE

## 2021-11-29 LAB — RHEUMATOID FACTOR: Rheumatoid fact SerPl-aCnc: <14 IU/mL (ref <14)

## 2021-11-29 LAB — ANA: Anti Nuclear Antibody (ANA): POSITIVE — AB

## 2021-11-29 LAB — ANTI-NUCLEAR AB-TITER (ANA TITER): ANA Titer 1: 1:40 {titer} — ABNORMAL HIGH

## 2021-12-03 NOTE — Progress Notes (Signed)
Culture not overly  helpful for official dx of herpes on lip however was pt with relief with daily suppression medication of valtrex? Aka have sores improved?  Hiv negative. Ana very low positive, same as six months ago. We will leave this for monitoring as could be false positive being in that it is so low.

## 2021-12-05 NOTE — Progress Notes (Signed)
Is she taking otc b12 still?  Sometimes it is just not in the diet enough and or is not absorbed well with the intestines so sometimes we do need additional supplementation. I would stop injections for now, make sure she is taking 1000 mcg once daily of b12 otc and recheck in three months.   Advise pt to make sure she is drinking at least six bottles water with valtrex as it can dehydrate her making her lipids seem more dry. Also make sure she tells her dentist that she is taking amlodipine so they can assess for any gingivitis.

## 2021-12-12 DIAGNOSIS — Z8639 Personal history of other endocrine, nutritional and metabolic disease: Secondary | ICD-10-CM | POA: Diagnosis not present

## 2021-12-12 DIAGNOSIS — Z8669 Personal history of other diseases of the nervous system and sense organs: Secondary | ICD-10-CM | POA: Diagnosis not present

## 2021-12-12 DIAGNOSIS — Z8673 Personal history of transient ischemic attack (TIA), and cerebral infarction without residual deficits: Secondary | ICD-10-CM | POA: Diagnosis not present

## 2021-12-12 DIAGNOSIS — Z87891 Personal history of nicotine dependence: Secondary | ICD-10-CM | POA: Diagnosis not present

## 2021-12-13 NOTE — Progress Notes (Signed)
Ok great, yes we will continue with monthly b12 injections.

## 2021-12-24 ENCOUNTER — Ambulatory Visit (INDEPENDENT_AMBULATORY_CARE_PROVIDER_SITE_OTHER): Payer: Medicare Other

## 2021-12-24 VITALS — Ht 67.0 in | Wt 139.0 lb

## 2021-12-24 DIAGNOSIS — Z0000 Encounter for general adult medical examination without abnormal findings: Secondary | ICD-10-CM

## 2021-12-24 NOTE — Patient Instructions (Addendum)
[name] , Thank you for taking time to come for your Medicare Wellness Visit. I appreciate your ongoing commitment to your health goals. Please review the following plan we discussed and let me know if I can assist you in the future.   These are the goals we discussed:  Goals      Maintain Healthy Lifestyle     Stay active Stay hydrated Healthy diet        This is a list of the screening recommended for you and due dates:  Health Maintenance  Topic Date Due   Pneumonia Vaccine (2 - PPSV23 or PCV20) 11/14/2013   DTaP/Tdap/Td vaccine (2 - Td or Tdap) 01/06/2018   COVID-19 Vaccine (1) 01/09/2022*   DEXA scan (bone density measurement)  02/06/2022*   Colon Cancer Screening  02/06/2022*   Zoster (Shingles) Vaccine (1 of 2) 03/25/2022*   Mammogram  12/25/2022*   Hepatitis C Screening: USPSTF Recommendation to screen - Ages 18-79 yo.  12/25/2022*   Medicare Annual Wellness Visit  12/25/2022   Flu Shot  Completed   HPV Vaccine  Aged Out  *Topic was postponed. The date shown is not the original due date.    Advanced directives: End of life planning; Advance aging; Advanced directives discussed.  Copy of current HCPOA/Living Will requested.    Conditions/risks identified: none new.  Next appointment: Follow up in one year for your annual wellness visit    Preventive Care 65 Years and Older, Female Preventive care refers to lifestyle choices and visits with your health care provider that can promote health and wellness. What does preventive care include? A yearly physical exam. This is also called an annual well check. Dental exams once or twice a year. Routine eye exams. Ask your health care provider how often you should have your eyes checked. Personal lifestyle choices, including: Daily care of your teeth and gums. Regular physical activity. Eating a healthy diet. Avoiding tobacco and drug use. Limiting alcohol use. Practicing safe sex. Taking low-dose aspirin every  day. Taking vitamin and mineral supplements as recommended by your health care provider. What happens during an annual well check? The services and screenings done by your health care provider during your annual well check will depend on your age, overall health, lifestyle risk factors, and family history of disease. Counseling  Your health care provider may ask you questions about your: Alcohol use. Tobacco use. Drug use. Emotional well-being. Home and relationship well-being. Sexual activity. Eating habits. History of falls. Memory and ability to understand (cognition). Work and work Statistician. Reproductive health. Screening  You may have the following tests or measurements: Height, weight, and BMI. Blood pressure. Lipid and cholesterol levels. These may be checked every 5 years, or more frequently if you are over 48 years old. Skin check. Lung cancer screening. You may have this screening every year starting at age 46 if you have a 30-pack-year history of smoking and currently smoke or have quit within the past 15 years. Fecal occult blood test (FOBT) of the stool. You may have this test every year starting at age 67. Flexible sigmoidoscopy or colonoscopy. You may have a sigmoidoscopy every 5 years or a colonoscopy every 10 years starting at age 29. Hepatitis C blood test. Hepatitis B blood test. Sexually transmitted disease (STD) testing. Diabetes screening. This is done by checking your blood sugar (glucose) after you have not eaten for a while (fasting). You may have this done every 1-3 years. Bone density scan. This is done  to screen for osteoporosis. You may have this done starting at age 6. Mammogram. This may be done every 1-2 years. Talk to your health care provider about how often you should have regular mammograms. Talk with your health care provider about your test results, treatment options, and if necessary, the need for more tests. Vaccines  Your health care  provider may recommend certain vaccines, such as: Influenza vaccine. This is recommended every year. Tetanus, diphtheria, and acellular pertussis (Tdap, Td) vaccine. You may need a Td booster every 10 years. Zoster vaccine. You may need this after age 70. Pneumococcal 13-valent conjugate (PCV13) vaccine. One dose is recommended after age 64. Pneumococcal polysaccharide (PPSV23) vaccine. One dose is recommended after age 56. Talk to your health care provider about which screenings and vaccines you need and how often you need them. This information is not intended to replace advice given to you by your health care provider. Make sure you discuss any questions you have with your health care provider. Document Released: 01/19/2015 Document Revised: 09/12/2015 Document Reviewed: 10/24/2014 Elsevier Interactive Patient Education  2017 [place] Prevention in the Home Falls can cause injuries. They can happen to people of all ages. There are many things you can do to make your home safe and to help prevent falls. What can I do on the outside of my home? Regularly fix the edges of walkways and driveways and fix any cracks. Remove anything that might make you trip as you walk through a door, such as a raised step or threshold. Trim any bushes or trees on the path to your home. Use bright outdoor lighting. Clear any walking paths of anything that might make someone trip, such as rocks or tools. Regularly check to see if handrails are loose or broken. Make sure that both sides of any steps have handrails. Any raised decks and porches should have guardrails on the edges. Have any leaves, snow, or ice cleared regularly. Use sand or salt on walking paths during winter. Clean up any spills in your garage right away. This includes oil or grease spills. What can I do in the bathroom? Use night lights. Install grab bars by the toilet and in the tub and shower. Do not use towel bars as grab  bars. Use non-skid mats or decals in the tub or shower. If you need to sit down in the shower, use a plastic, non-slip stool. Keep the floor dry. Clean up any water that spills on the floor as soon as it happens. Remove soap buildup in the tub or shower regularly. Attach bath mats securely with double-sided non-slip rug tape. Do not have throw rugs and other things on the floor that can make you trip. What can I do in the bedroom? Use night lights. Make sure that you have a light by your bed that is easy to reach. Do not use any sheets or blankets that are too big for your bed. They should not hang down onto the floor. Have a firm chair that has side arms. You can use this for support while you get dressed. Do not have throw rugs and other things on the floor that can make you trip. What can I do in the kitchen? Clean up any spills right away. Avoid walking on wet floors. Keep items that you use a lot in easy-to-reach places. If you need to reach something above you, use a strong step stool that has a grab bar. Keep electrical cords out of the  way. Do not use floor polish or wax that makes floors slippery. If you must use wax, use non-skid floor wax. Do not have throw rugs and other things on the floor that can make you trip. What can I do with my stairs? Do not leave any items on the stairs. Make sure that there are handrails on both sides of the stairs and use them. Fix handrails that are broken or loose. Make sure that handrails are as long as the stairways. Check any carpeting to make sure that it is firmly attached to the stairs. Fix any carpet that is loose or worn. Avoid having throw rugs at the top or bottom of the stairs. If you do have throw rugs, attach them to the floor with carpet tape. Make sure that you have a light switch at the top of the stairs and the bottom of the stairs. If you do not have them, ask someone to add them for you. What else can I do to help prevent  falls? Wear shoes that: Do not have high heels. Have rubber bottoms. Are comfortable and fit you well. Are closed at the toe. Do not wear sandals. If you use a stepladder: Make sure that it is fully opened. Do not climb a closed stepladder. Make sure that both sides of the stepladder are locked into place. Ask someone to hold it for you, if possible. Clearly mark and make sure that you can see: Any grab bars or handrails. First and last steps. Where the edge of each step is. Use tools that help you move around (mobility aids) if they are needed. These include: Canes. Walkers. Scooters. Crutches. Turn on the lights when you go into a dark area. Replace any light bulbs as soon as they burn out. Set up your furniture so you have a clear path. Avoid moving your furniture around. If any of your floors are uneven, fix them. If there are any pets around you, be aware of where they are. Review your medicines with your doctor. Some medicines can make you feel dizzy. This can increase your chance of falling. Ask your doctor what other things that you can do to help prevent falls. This information is not intended to replace advice given to you by your health care provider. Make sure you discuss any questions you have with your health care provider. Document Released: 10/19/2008 Document Revised: 05/31/2015 Document Reviewed: 01/27/2014 Elsevier Interactive Patient Education  2017 [employer].

## 2021-12-24 NOTE — Progress Notes (Signed)
Subjective:   [name] is a 66 y.o. female who presents for an Initial Medicare Annual Wellness Visit.  Review of Systems    No ROS.  Medicare Wellness Virtual Visit.  Visual/audio telehealth visit, UTA vital signs.   See social history for additional risk factors.   Cardiac Risk Factors include: advanced age (>30mn, >>67women)     Objective:    Today's Vitals   12/24/21 1449  Weight: 139 lb (63 kg)  Height: '5\' 7"'$  (1.702 m)   Body mass index is 21.77 kg/m.     12/24/2021    3:30 PM 07/10/2020   11:04 AM 05/08/2020    4:33 PM 11/03/2019    4:28 PM 09/20/2019   11:00 AM 10/17/2017    1:17 PM 03/12/2017    3:26 PM  Advanced Directives  Does Patient Have a Medical Advance Directive? Yes Yes No No No No Yes  Type of [place]     Living will  Does patient want to make changes to medical advance directive? No - Patient declined        Copy of [place] Chart? No - copy requested        Would patient like information on creating a medical advance directive?   No - Patient declined  No - Patient declined No - Patient declined     Current Medications (verified) Outpatient Encounter Medications as of 12/24/2021  Medication Sig   acetaminophen (TYLENOL) 325 MG tablet Take 2 tablets (650 mg total) by mouth every 4 (four) hours as needed for mild pain (or temp > 37.5 C (99.5 F)).   amLODipine (NORVASC) 5 MG tablet Take 1 tablet (5 mg total) by mouth daily.   aspirin EC 81 MG EC tablet Take 1 tablet (81 mg total) by mouth daily. Swallow whole.   atorvastatin (LIPITOR) 40 MG tablet Take 40 mg by mouth every other day.   Budeson-Glycopyrrol-Formoterol (BREZTRI AEROSPHERE) 160-9-4.8 MCG/ACT AERO Inhale 2 puffs into the lungs 2 (two) times daily.   cyanocobalamin (VITAMIN B12) 1000 MCG/ML injection Inject 1,000 mcg into the muscle once. Patient's first dose was 10/23/2021    valACYclovir (VALTREX) 500 MG tablet Take 1 tablet (500 mg total) by mouth daily.   valACYclovir (VALTREX) 500 MG tablet Take 1 tablet (500 mg total) by mouth daily.   No facility-administered encounter medications on file as of 12/24/2021.    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   DORSAL COMPARTMENT RELEASE Right 02/07/2013   Procedure: RIGHT WRIST DEQUERVAIN RELEASE;  Surgeon: DJolyn Nap MD;  [facility]  Service: Orthopedics;  Laterality: Right;   KIDNEY  DONATION  2008   gave her lt kidney to daughter   NECK MASS EXCISION     rt-mass   [place]   parotidectomy, one gland removed   SHOULDER ARTHROSCOPY WITH OPEN ROTATOR CUFF REPAIR AND DISTAL CLAVICLE ACROMINECTOMY Right 04/25/2014   Procedure: SHOULDER ARTHROSCOPY WITH MINI-OPEN ROTATOR CUFF REPAIR AND DISTAL CLAVICLE RESECTION;  Surgeon: Garald Balding, MD;  Location: [facility];  Service: Orthopedics;  Laterality: Right;   SHOULDER CLOSED REDUCTION Right 08/31/2014   Procedure: CLOSED MANIPULATION SHOULDER;  Surgeon: Garald Balding, MD;  Location: [facility];  Service: Orthopedics;  Laterality: Right;   SUBACROMIAL DECOMPRESSION Right 04/25/2014   Procedure:  SUBACROMIAL DECOMPRESSION;  Surgeon: Garald Balding, MD;  Location: [facility];  Service: Orthopedics;  Laterality: Right;   THYROID SURGERY     bx right thyroid   Family History  Problem Relation Age of Onset   Hypertension Mother    Heart disease Mother        Irregular heart beat    Arrhythmia Mother    Rheum arthritis Mother    Pulmonary fibrosis Mother    Osteoporosis Mother    Lung cancer Father    COPD Father    Pulmonary fibrosis Father    Hypertension Sister    Hypertension Sister    Aneurysm Brother 55       brain   Heart disease Maternal Grandmother    Heart disease Maternal Aunt    Pulmonary fibrosis Maternal Aunt    Rheum arthritis Maternal Uncle    Pulmonary fibrosis Maternal Uncle    Diabetes Maternal Uncle    Colon cancer Neg Hx    Colon polyps Neg Hx    Social History   Socioeconomic History   Marital status: Single    Spouse name: Not on file   Number of children: 5   Years of education: Not on file   Highest education level: Not on file  Occupational History   Occupation: Disabled    Employer: Therapist, occupational    Comment: assembly line  Tobacco Use   Smoking status: Former    Packs/day: 0.25    Years: 54.00    Total pack years: 13.50    Types: Cigarettes, E-cigarettes    Quit date: 10/25/2014    Years since quitting: 7.1   Smokeless tobacco: Never  Vaping Use   Vaping Use: Never used  Substance and Sexual Activity   Alcohol use: No   Drug use: No   Sexual activity: Not Currently  Other Topics Concern   Not on file  Social History Narrative   Pt donated kidney to daughter 5 yrs ago.   Lives with son and his family   Right Handed    Drinks 3-4 cups caffeine daily      Social Determinants of Health   Financial Resource Strain: Low Risk  (12/24/2021)   Overall Financial Resource Strain (CARDIA)    Difficulty of Paying Living Expenses: Not hard at all  Food Insecurity: No Food Insecurity (12/24/2021)   Hunger Vital Sign     Worried About Running Out of Food in the Last Year: Never true    Ran Out of Food in the Last Year: Never true  Transportation Needs: No Transportation Needs (12/24/2021)   PRAPARE - Hydrologist (Medical): No    Lack of Transportation (Non-Medical): No  Physical Activity: Insufficiently Active (12/24/2021)   Exercise Vital Sign  Days of Exercise per Week: 5 days    Minutes of Exercise per Session: 20 min  Stress: No Stress Concern Present (12/24/2021)   [place]    Feeling of Stress : Not at all  Social Connections: Unknown (12/24/2021)   Social Connection and Isolation Panel [NHANES]    Frequency of Communication with Friends and Family: More than three times a week    Frequency of Social Gatherings with Friends and Family: More than three times a week    Attends Religious Services: Not on Advertising copywriter or Organizations: Not on file    Attends Archivist Meetings: Not on file    Marital Status: Not on file    Tobacco Counseling Counseling given: Not Answered   Clinical Intake:  Pre-visit preparation completed: Yes        Diabetes: No  How often do you need to have someone help you when you read instructions, pamphlets, or other written materials from your doctor or pharmacy?: 1 - Never   Interpreter Needed?: No      Activities of Daily Living    12/24/2021    2:54 PM  In your present state of health, do you have any difficulty performing the following activities:  Hearing? 0  Vision? 0  Difficulty concentrating or making decisions? 0  Walking or climbing stairs? 0  Dressing or bathing? 0  Doing errands, shopping? 1  Comment Family assist as needed  Preparing Food and eating ? Y  Using the Toilet? N  In the past six months, have you accidently leaked urine? Y  Comment Managed with daily pad  Do you have problems with loss of bowel  control? N  Managing your Medications? N  Managing your Finances? N  Housekeeping or managing your Housekeeping? Y    Patient Care Team: Eugenia Pancoast, FNP as PCP - General (Family Medicine) Jamal Maes, MD as Consulting Physician (Nephrology) Gala Romney Cristopher Estimable, MD as Consulting Physician (Gastroenterology)  Indicate any recent Medical Services you may have received from other than [facility] providers in the past year (date may be approximate).     Assessment:   This is a routine wellness examination for [place].  I connected with  [name] on 12/24/21 by a audio enabled telemedicine application and verified that I am speaking with the correct person using two identifiers.  Patient Location: Home  Provider Location: Office/Clinic  I discussed the limitations of evaluation and management by telemedicine. The patient expressed understanding and agreed to proceed.   Hearing/Vision screen Hearing Screening - Comments:: Patient is able to hear conversational tones without difficulty.  No issues reported.   Vision Screening - Comments:: Wears corrective lenses    Dietary issues and exercise activities discussed: Current Exercise Habits: Home exercise routine, Type of exercise: walking, Intensity: Mild   Goals Addressed             This Visit's Progress    Maintain Healthy Lifestyle       Stay active Stay hydrated Healthy diet       Depression Screen    12/24/2021    3:05 PM  PHQ 2/9 Scores  PHQ - 2 Score 0    Fall Risk    12/24/2021    3:31 PM  [place] in the past year? 0  Number falls in past yr: 0  Injury with Fall? 0  Risk for fall due  to : No Fall Risks  Follow up Falls evaluation completed    [place]: Home free of loose throw rugs in walkways, pet beds, electrical cords, etc? Yes  Adequate lighting in your home to reduce risk of falls? Yes   ASSISTIVE DEVICES UTILIZED TO PREVENT FALLS: Life  alert? No  Use of a cane, walker or w/c? No  Grab bars in the bathroom? No  Shower chair or bench in shower? No  Elevated toilet seat or a handicapped toilet? No   TIMED UP AND GO: Was the test performed? No .   Cognitive Function:        12/24/2021    3:31 PM  6CIT Screen  What Year? 0 points  What month? 0 points  What time? 0 points  Count back from 20 0 points  Months in reverse 0 points  Repeat phrase 0 points  Total Score 0 points    Immunizations Immunization History  Administered Date(s) Administered   Fluad Quad(high Dose 65+) 10/23/2021   Influenza Whole 10/15/2006   Influenza,inj,Quad PF,6+ Mos 09/19/2013, 10/02/2014   Pneumococcal Conjugate-13 09/19/2013   Pneumococcal-Unspecified 01/07/2008   Tdap 01/07/2008   TDAP status: Due, Education has been provided regarding the importance of this vaccine. Advised may receive this vaccine at local pharmacy or Health Dept. Aware to provide a copy of the vaccination record if obtained from local pharmacy or Health Dept. Verbalized acceptance and understanding. Notes completed. Agrees to update immunization record.   Pneumococcal vaccine status: Due, Education has been provided regarding the importance of this vaccine. Advised may receive this vaccine at local pharmacy or Health Dept. Aware to provide a copy of the vaccination record if obtained from local pharmacy or Health Dept. Verbalized acceptance and understanding.  Covid-19 vaccine status: Declined, Education has been provided regarding the importance of this vaccine but patient still declined. Advised may receive this vaccine at local pharmacy or Health Dept.or vaccine clinic. Aware to provide a copy of the vaccination record if obtained from local pharmacy or Health Dept. Verbalized acceptance and understanding.   Shingrix Completed?: No.    Education has been provided regarding the importance of this vaccine. Patient has been advised to call insurance company to  determine out of pocket expense if they have not yet received this vaccine. Advised may also receive vaccine at local pharmacy or Health Dept. Verbalized acceptance and understanding.  Screening Tests Health Maintenance  Topic Date Due   Pneumonia Vaccine 38+ Years old (2 - PPSV23 or PCV20) 11/14/2013   DTaP/Tdap/Td (2 - Td or Tdap) 01/06/2018   COVID-19 Vaccine (1) 01/09/2022 (Originally 02/15/1956)   DEXA SCAN  02/06/2022 (Originally 08/15/2019)   COLONOSCOPY (Pts 45-42yr Insurance coverage will need to be confirmed)  02/06/2022 (Originally 08/11/2018)   Zoster Vaccines- Shingrix (1 of 2) 03/25/2022 (Originally 08/14/2004)   MAMMOGRAM  12/25/2022 (Originally 07/15/2017)   Hepatitis C Screening  12/25/2022 (Originally 08/14/1973)   Medicare Annual Wellness (AWV)  12/25/2022   INFLUENZA VACCINE  Completed   HPV VACCINES  Aged Out   Health Maintenance Health Maintenance Due  Topic Date Due   Pneumonia Vaccine 67 Years old (2 - PPSV23 or PCV20) 11/14/2013   DTaP/Tdap/Td (2 - Td or Tdap) 01/06/2018   Colonoscopy- deferred per patient.   Mammogram- reports completed 2023. Completed by [facility]   Dexa Scan- deferred per patient preference.   Lung Cancer Screening: (Low Dose CT Chest recommended if Age 67-80years, 30 pack-year  currently smoking OR have quit w/in 15years.) does not qualify.   Hepatitis C Screening: deferred per patient.   Vision Screening: Recommended annual ophthalmology exams for early detection of glaucoma and other disorders of the eye.  Dental Screening: Recommended annual dental exams for proper oral hygiene.  Community Resource Referral / Chronic Care Management: CRR required this visit?  No   CCM required this visit?  No      Plan:     I have personally reviewed and noted the following in the patient's chart:   Medical and social history Use of alcohol, tobacco or illicit drugs  Current medications and supplements including opioid prescriptions.  Patient is not currently taking opioid prescriptions. Functional ability and status Nutritional status Physical activity Advanced directives List of other physicians Hospitalizations, surgeries, and ER visits in previous 12 months Vitals Screenings to include cognitive, depression, and falls Referrals and appointments  In addition, I have reviewed and discussed with patient certain preventive protocols, quality metrics, and best practice recommendations. A written personalized care plan for preventive services as well as general preventive health recommendations were provided to patient.     Leta Jungling, LPN   [id number]

## 2022-01-03 ENCOUNTER — Telehealth: Payer: Self-pay | Admitting: Family

## 2022-01-03 ENCOUNTER — Encounter: Payer: Self-pay | Admitting: Family Medicine

## 2022-01-03 ENCOUNTER — Ambulatory Visit (INDEPENDENT_AMBULATORY_CARE_PROVIDER_SITE_OTHER): Payer: Medicare Other | Admitting: Family Medicine

## 2022-01-03 VITALS — BP 146/82 | HR 78 | Temp 97.6°F | Ht 67.0 in | Wt 138.4 lb

## 2022-01-03 DIAGNOSIS — J069 Acute upper respiratory infection, unspecified: Secondary | ICD-10-CM | POA: Diagnosis not present

## 2022-01-03 DIAGNOSIS — R52 Pain, unspecified: Secondary | ICD-10-CM | POA: Diagnosis not present

## 2022-01-03 LAB — POCT INFLUENZA A/B
Influenza A, POC: NEGATIVE
Influenza B, POC: NEGATIVE

## 2022-01-03 LAB — POC COVID19 BINAXNOW: SARS Coronavirus 2 Ag: NEGATIVE

## 2022-01-03 MED ORDER — BENZONATATE 200 MG PO CAPS: 200.0000 mg | ORAL_CAPSULE | Freq: Three times a day (TID) | ORAL | 1 refills | Status: DC | PRN

## 2022-01-03 NOTE — Patient Instructions (Addendum)
Keep using the inhaler twice daily   Tylenol is the best thing for body aches/ chills or fever  Also for headache or ear pain   Keep up a good fluid intake    If the cough gets worse try the tessalon (benzonatate)   Take care of yourself   If symptoms get severe or you have trouble breathing go to the ER  If you get wheezing or chest tightness let us know

## 2022-01-03 NOTE — Telephone Encounter (Signed)
Patient called in and stated that her insurance don't care benzonatate (TESSALON) 200 MG capsule . She was wondering if something else could be sent in for her. Thank you!

## 2022-01-03 NOTE — Progress Notes (Unsigned)
   Subjective:    Patient ID: [name], female    DOB: 04-30-1955, 66 y.o.   MRN: [id number]  HPI 66 yo pt pt of NP Dugal presents with uri symptoms   Wt Readings from Last 3 Encounters:  01/03/22 138 lb 6 oz (62.8 kg)  12/24/21 139 lb (63 kg)  11/26/21 139 lb 4 oz (63.2 kg)   Vitals:   01/03/22 1544  BP: (!) 146/82  Pulse: 78  Temp: 97.6 F (36.4 C)  TempSrc: Temporal  SpO2: 97%  Weight: 138 lb 6 oz (62.8 kg)  Height: '5\' 7"'$  (1.702 m)   She was exp to covid and rsv   She has had covid shots   Symptoms started 12/26  Sore throat  Body aches  Few chills- very brief  Has not taken temp  Little tickle with a cough - pretty mild  Little phlegm-unsure what color it is  No headache  Left ear hurt a little when she lies on it  No nasal symptoms  Has a hoarse voice   Some diarrhea  No n/v  No change in taste   Pt has a h/o emphysema  Uses breztri inhaler bid   Otc: Vit c No cold medicines   Results for orders placed or performed in visit on 01/03/22  POC COVID-19  Result Value Ref Range   SARS Coronavirus 2 Ag Negative Negative  POCT Influenza A/B  Result Value Ref Range   Influenza A, POC Negative Negative   Influenza B, POC Negative Negative     Review of Systems     Objective:   Physical Exam        Assessment & Plan:

## 2022-01-05 ENCOUNTER — Encounter: Payer: Self-pay | Admitting: Family Medicine

## 2022-01-05 NOTE — Telephone Encounter (Signed)
With her med intolerances (narcotic and alos guaifenesin) I recommend delsym otc I did sent a mychart message re: this

## 2022-01-05 NOTE — Assessment & Plan Note (Signed)
Neg testing for covid and flu  Reassuring exam Discussed symptom care Also rev ER precautions Sent tessalon for cough prn   Enc fluids and rest  Update if not starting to improve in a week or if worsening

## 2022-01-07 NOTE — Telephone Encounter (Signed)
Pt viewed Dr. Marliss Coots comments via mychart and did respond

## 2022-01-30 ENCOUNTER — Other Ambulatory Visit: Payer: Self-pay | Admitting: Family

## 2022-01-30 DIAGNOSIS — I10 Essential (primary) hypertension: Secondary | ICD-10-CM

## 2022-02-24 ENCOUNTER — Ambulatory Visit (INDEPENDENT_AMBULATORY_CARE_PROVIDER_SITE_OTHER): Payer: 59 | Admitting: Family

## 2022-02-24 ENCOUNTER — Encounter: Payer: Self-pay | Admitting: Family

## 2022-02-24 VITALS — BP 124/76 | HR 82 | Temp 97.7°F | Ht 67.0 in | Wt 130.9 lb

## 2022-02-24 DIAGNOSIS — E7800 Pure hypercholesterolemia, unspecified: Secondary | ICD-10-CM

## 2022-02-24 DIAGNOSIS — E538 Deficiency of other specified B group vitamins: Secondary | ICD-10-CM

## 2022-02-24 DIAGNOSIS — Z79899 Other long term (current) drug therapy: Secondary | ICD-10-CM

## 2022-02-24 DIAGNOSIS — B009 Herpesviral infection, unspecified: Secondary | ICD-10-CM

## 2022-02-24 DIAGNOSIS — F4323 Adjustment disorder with mixed anxiety and depressed mood: Secondary | ICD-10-CM | POA: Diagnosis not present

## 2022-02-24 DIAGNOSIS — I10 Essential (primary) hypertension: Secondary | ICD-10-CM | POA: Diagnosis not present

## 2022-02-24 DIAGNOSIS — F4321 Adjustment disorder with depressed mood: Secondary | ICD-10-CM

## 2022-02-24 LAB — COMPREHENSIVE METABOLIC PANEL
ALT: 24 U/L (ref 0–35)
AST: 26 U/L (ref 0–37)
Albumin: 4 g/dL (ref 3.5–5.2)
Alkaline Phosphatase: 98 U/L (ref 39–117)
BUN: 13 mg/dL (ref 6–23)
CO2: 32 mEq/L (ref 19–32)
Calcium: 9.6 mg/dL (ref 8.4–10.5)
Chloride: 104 mEq/L (ref 96–112)
Creatinine, Ser: 1.07 mg/dL (ref 0.40–1.20)
GFR: 53.74 mL/min — ABNORMAL LOW (ref >60.00)
Glucose, Bld: 99 mg/dL (ref 70–99)
Potassium: 4.1 mEq/L (ref 3.5–5.1)
Sodium: 142 mEq/L (ref 135–145)
Total Bilirubin: 0.6 mg/dL (ref 0.2–1.2)
Total Protein: 6.9 g/dL (ref 6.0–8.3)

## 2022-02-24 LAB — B12 AND FOLATE PANEL
Folate: 14.5 ng/mL (ref >5.9)
Vitamin B-12: 393 pg/mL (ref 211–911)

## 2022-02-24 LAB — LIPID PANEL
Cholesterol: 157 mg/dL (ref 0–200)
HDL: 58.6 mg/dL (ref >39.00)
LDL Cholesterol: 73 mg/dL (ref 0–99)
NonHDL: 98.4
Total CHOL/HDL Ratio: 3
Triglycerides: 128 mg/dL (ref 0.0–149.0)
VLDL: 25.6 mg/dL (ref 0.0–40.0)

## 2022-02-24 MED ORDER — FLUOXETINE HCL 10 MG PO TABS: 10.0000 mg | ORAL_TABLET | Freq: Every day | ORAL | 3 refills | Status: DC

## 2022-02-24 NOTE — Assessment & Plan Note (Signed)
Start taking valtrex 500 mg once daily instead of prn for recurrent infections

## 2022-02-24 NOTE — Patient Instructions (Signed)
  Stop by the lab prior to leaving today. I will notify you of your results once received.   Stop xyzal start zyrtec once nightly.  ------------------------------------  Start 10 mg prozac mg for anxiety and depression. Take 1/2 tablet by mouth once daily for about one week, then increase to 1 full tablet thereafter.   Taking the medicine as directed and not missing any doses is one of the best things you can do to treat your depression.  Here are some things to keep in mind:  Side effects (stomach upset, some increased anxiety) may happen before you notice a benefit.  These side effects typically go away over time. Changes to your dose of medicine or a change in medication all together is sometimes necessary Many people will notice an improvement within two weeks but the full effect of the medication can take up to 4-6 weeks Stopping the medication when you start feeling better often results in a return of symptoms. Most people need to be on medication at least 6-12 months If you start having thoughts of hurting yourself or others after starting this medicine, please call me immediately.    ------------------------------------   Regards,   Eugenia Pancoast FNP-C

## 2022-02-24 NOTE — Progress Notes (Signed)
Established Patient Office Visit  Subjective:      CC:  Chief Complaint  Patient presents with   Medical Management of Chronic Issues    HPI: [name] is a 67 y.o. female presenting on 02/24/2022 for Medical Management of Chronic Issues . COPD: on breztri inhaler two puffs two times daily.   HLD: on lipitor 40 mg once daily.   Stomatitis: started two days ago again for valtrex, as she was getting them often. Stopped the valrex about a few months ago, but they have since reoccurred.   New complaints:  Mom recently passed 02/01/22 , she had a PE and she had blood loss and they found stage IV colon cancer. Emotions are all over the place since I have last seen her, she is sleeping more than often, and having trouble going through her mom's home.denies SI HI    COPD stable still with slight DOE. Still taking her inhaler daily.         Social history:  Relevant past medical, surgical, family and social history reviewed and updated as indicated. Interim medical history since our last visit reviewed.  Allergies and medications reviewed and updated.  DATA REVIEWED: CHART IN EPIC     ROS: Negative unless specifically indicated above in HPI.    Current Outpatient Medications:    FLUoxetine (PROZAC) 10 MG tablet, Take 1 tablet (10 mg total) by mouth daily., Disp: 90 tablet, Rfl: 3   acetaminophen (TYLENOL) 325 MG tablet, Take 2 tablets (650 mg total) by mouth every 4 (four) hours as needed for mild pain (or temp > 37.5 C (99.5 F))., Disp: , Rfl:    amLODipine (NORVASC) 5 MG tablet, TAKE 1 TABLET BY MOUTH DAILY, Disp: 100 tablet, Rfl: 2   aspirin EC 81 MG EC tablet, Take 1 tablet (81 mg total) by mouth daily. Swallow whole., Disp: 30 tablet, Rfl: 11   atorvastatin (LIPITOR) 40 MG tablet, Take 40 mg by mouth every other day., Disp: , Rfl:    Budeson-Glycopyrrol-Formoterol (BREZTRI AEROSPHERE) 160-9-4.8 MCG/ACT AERO, Inhale 2 puffs into the lungs 2 (two) times  daily., Disp: 10.7 g, Rfl: 11   cyanocobalamin (VITAMIN B12) 1000 MCG/ML injection, Inject 1,000 mcg into the muscle once. Patient's first dose was 10/23/2021, Disp: , Rfl:    valACYclovir (VALTREX) 500 MG tablet, Take 1 tablet (500 mg total) by mouth daily., Disp: 90 tablet, Rfl: 1      Objective:    BP 124/76   Pulse 82   Temp 97.7 F (36.5 C) (Temporal)   Ht 5' 7"$  (1.702 m)   Wt 130 lb 14.4 oz (59.4 kg)   SpO2 93%   BMI 20.50 kg/m   Wt Readings from Last 3 Encounters:  02/24/22 130 lb 14.4 oz (59.4 kg)  01/03/22 138 lb 6 oz (62.8 kg)  12/24/21 139 lb (63 kg)    Physical Exam Vitals reviewed.  Constitutional:      General: She is not in acute distress.    Appearance: Normal appearance. She is normal weight. She is not ill-appearing, toxic-appearing or diaphoretic.  HENT:     Head: Normocephalic.     Mouth/Throat:     Lips: Lesions (left upper outer lip with base of erythema and mild swelling) present.  Cardiovascular:     Rate and Rhythm: Normal rate and regular rhythm.  Pulmonary:     Effort: Pulmonary effort is normal.     Breath sounds: Normal breath sounds.  Musculoskeletal:  General: Normal range of motion.     Right lower leg: No edema.     Left lower leg: No edema.  Neurological:     General: No focal deficit present.     Mental Status: She is alert and oriented to person, place, and time. Mental status is at baseline.  Psychiatric:        Mood and Affect: Mood normal.        Behavior: Behavior normal.        Thought Content: Thought content normal.        Judgment: Judgment normal.            Assessment & Plan:  Grief reaction -     FLUoxetine HCl; Take 1 tablet (10 mg total) by mouth daily.  Dispense: 90 tablet; Refill: 3  Adjustment reaction with anxiety and depression Assessment & Plan:  Trial SSRI, I instructed pt to start prozac 1/2 tablet once daily for 1 week and then increase to a full tablet once daily on week two as tolerated.   We discussed common side effects such as nausea, drowsiness and weight gain.  Also discussed rare but serious side effect of suicidal ideation.  She is instructed to discontinue medication and go directly to ED if this occurs.  Pt verbalizes understanding.  Plan is to follow up in 30 days to evaluate progress.   Referral placed for therapy as well.   Pt declined filling out phq9 and or gad 7   Orders: -     FLUoxetine HCl; Take 1 tablet (10 mg total) by mouth daily.  Dispense: 90 tablet; Refill: 3  Elevated LDL cholesterol level -     Lipid panel  On statin therapy -     Comprehensive metabolic panel  Vitamin B 12 deficiency -     B12 and Folate Panel  HSV-1 (herpes simplex virus 1) infection Assessment & Plan: Start taking valtrex 500 mg once daily instead of prn for recurrent infections   Essential hypertension, benign Assessment & Plan: Controlled. Continue amlodipine 5 mg once daily      Return in about 6 weeks (around 04/07/2022) for f/u depression.  Eugenia Pancoast, MSN, APRN, FNP-C [place]

## 2022-02-24 NOTE — Assessment & Plan Note (Signed)
Trial SSRI, I instructed pt to start prozac 1/2 tablet once daily for 1 week and then increase to a full tablet once daily on week two as tolerated.  We discussed common side effects such as nausea, drowsiness and weight gain.  Also discussed rare but serious side effect of suicidal ideation.  She is instructed to discontinue medication and go directly to ED if this occurs.  Pt verbalizes understanding.  Plan is to follow up in 30 days to evaluate progress.   Referral placed for therapy as well.   Pt declined filling out phq9 and or gad 7

## 2022-02-24 NOTE — Assessment & Plan Note (Addendum)
Controlled. Continue amlodipine 5 mg once daily

## 2022-03-10 DIAGNOSIS — M25552 Pain in left hip: Secondary | ICD-10-CM | POA: Diagnosis not present

## 2022-04-07 ENCOUNTER — Other Ambulatory Visit: Payer: Self-pay | Admitting: Family

## 2022-04-07 DIAGNOSIS — B009 Herpesviral infection, unspecified: Secondary | ICD-10-CM

## 2022-04-09 ENCOUNTER — Ambulatory Visit: Payer: 59 | Admitting: Physician Assistant

## 2022-04-11 ENCOUNTER — Encounter: Payer: Self-pay | Admitting: Nurse Practitioner

## 2022-04-11 ENCOUNTER — Ambulatory Visit (INDEPENDENT_AMBULATORY_CARE_PROVIDER_SITE_OTHER): Payer: 59 | Admitting: Nurse Practitioner

## 2022-04-11 VITALS — BP 120/76 | HR 105 | Temp 97.8°F | Resp 16 | Ht 67.0 in | Wt 134.4 lb

## 2022-04-11 DIAGNOSIS — J029 Acute pharyngitis, unspecified: Secondary | ICD-10-CM | POA: Diagnosis not present

## 2022-04-11 DIAGNOSIS — R051 Acute cough: Secondary | ICD-10-CM | POA: Diagnosis not present

## 2022-04-11 DIAGNOSIS — R0982 Postnasal drip: Secondary | ICD-10-CM | POA: Diagnosis not present

## 2022-04-11 DIAGNOSIS — J069 Acute upper respiratory infection, unspecified: Secondary | ICD-10-CM | POA: Diagnosis not present

## 2022-04-11 LAB — POCT RAPID STREP A (OFFICE): Rapid Strep A Screen: NEGATIVE

## 2022-04-11 LAB — POC COVID19 BINAXNOW: SARS Coronavirus 2 Ag: NEGATIVE

## 2022-04-11 NOTE — Assessment & Plan Note (Signed)
Start using Flonase nasal spray as directed patient has prescription at home

## 2022-04-11 NOTE — Assessment & Plan Note (Signed)
Strep test and COVID test negative in office.  Likely secondary to postnasal drip.  Patient has Flonase at home instructed her to use over the next week

## 2022-04-11 NOTE — Assessment & Plan Note (Signed)
Likely secondary to postnasal drip.  Continue using second-generation antihistamine and Flonase.  Patient states Jerilynn Som do not work well but she can try over-the-counter Coricidin HBP cough medication.  Follow-up if no improvement

## 2022-04-11 NOTE — Patient Instructions (Signed)
Start taking the flonase that you have at home Continue using the allergra Use the Coricedin cough medication as the bottle directs Drink plenty of fluid Keep your appointment as scheduled with [place]

## 2022-04-11 NOTE — Assessment & Plan Note (Signed)
Seems viral in nature.  Negative flu negative strep in office.  Symptomatic treatment with over-the-counter cough suppression, Flonase, antihistamine rest and fluids.  She has an appoint with her primary care on Monday if she is not improving she can be reexamined and consider for antibiotics

## 2022-04-11 NOTE — Progress Notes (Signed)
Acute Office Visit  Subjective:     Patient ID: [name], female    DOB: 05/07/1955, 67 y.o.   MRN: [id number]  Chief Complaint  Patient presents with   Sore Throat   Cough     Patient is in today for sick visit with a history of HTN, CVA, emphysema, CKD.  Symptoms started on 3 days  Covid vaccine: original 2 Flu vaccine: UTD PNA: UTD, Sick contacts: states that DIL was and was separated  States that she did ibuprofen and allergy medication. Allegra  Review of Systems  Constitutional:  Positive for chills and malaise/fatigue. Negative for fever.       Appetite is normal - states she does not like breakfast  Fluid intake is good coffee and tea  HENT:  Positive for sore throat. Negative for ear discharge, ear pain and sinus pain.   Respiratory:  Positive for cough, sputum production and shortness of breath (baseline).   Musculoskeletal:  Negative for joint pain and myalgias.  Neurological:  Negative for headaches.        Objective:    BP 120/76   Pulse (!) 105   Temp 97.8 F (36.6 C)   Resp 16   Ht 5\' 7"  (1.702 m)   Wt 134 lb 6 oz (61 kg)   SpO2 97%   BMI 21.05 kg/m    Physical Exam Vitals and nursing note reviewed.  Constitutional:      Appearance: Normal appearance.  HENT:     Right Ear: Tympanic membrane, ear canal and external ear normal.     Left Ear: Tympanic membrane, ear canal and external ear normal.     Mouth/Throat:     Mouth: Mucous membranes are moist.     Pharynx: Oropharynx is clear. Posterior oropharyngeal erythema present.  Cardiovascular:     Rate and Rhythm: Normal rate and regular rhythm.     Heart sounds: Normal heart sounds.  Pulmonary:     Effort: Pulmonary effort is normal.     Breath sounds: Normal breath sounds.  Lymphadenopathy:     Cervical: No cervical adenopathy.  Neurological:     Mental Status: She is alert.     Results for orders placed or performed in visit on 04/11/22  POC COVID-19 BinaxNow  Result  Value Ref Range   SARS Coronavirus 2 Ag Negative Negative  Rapid Strep A  Result Value Ref Range   Rapid Strep A Screen Negative Negative        Assessment & Plan:   Problem List Items Addressed This Visit       Respiratory   Upper respiratory tract infection    Seems viral in nature.  Negative flu negative strep in office.  Symptomatic treatment with over-the-counter cough suppression, Flonase, antihistamine rest and fluids.  She has an appoint with her primary care on Monday if she is not improving she can be reexamined and consider for antibiotics        Other   PND (post-nasal drip)    Start using Flonase nasal spray as directed patient has prescription at home      Acute cough - Primary    Likely secondary to postnasal drip.  Continue using second-generation antihistamine and Flonase.  Patient states Jerilynn Somessalon Perles do not work well but she can try over-the-counter Coricidin HBP cough medication.  Follow-up if no improvement      Relevant Orders   POC COVID-19 BinaxNow (Completed)   Sore throat    Strep  test and COVID test negative in office.  Likely secondary to postnasal drip.  Patient has Flonase at home instructed her to use over the next week      Relevant Orders   Rapid Strep A (Completed)    No orders of the defined types were placed in this encounter.   Return if symptoms worsen or fail to improve, for As scheduled .  Audria Nine, NP

## 2022-04-14 ENCOUNTER — Ambulatory Visit (INDEPENDENT_AMBULATORY_CARE_PROVIDER_SITE_OTHER): Payer: 59 | Admitting: Family

## 2022-04-14 ENCOUNTER — Encounter: Payer: Self-pay | Admitting: Family

## 2022-04-14 VITALS — BP 116/66 | HR 78 | Temp 97.9°F | Ht 67.0 in | Wt 136.4 lb

## 2022-04-14 DIAGNOSIS — F4323 Adjustment disorder with mixed anxiety and depressed mood: Secondary | ICD-10-CM

## 2022-04-14 DIAGNOSIS — Z789 Other specified health status: Secondary | ICD-10-CM | POA: Diagnosis not present

## 2022-04-14 DIAGNOSIS — J40 Bronchitis, not specified as acute or chronic: Secondary | ICD-10-CM

## 2022-04-14 DIAGNOSIS — Z87891 Personal history of nicotine dependence: Secondary | ICD-10-CM

## 2022-04-14 DIAGNOSIS — J439 Emphysema, unspecified: Secondary | ICD-10-CM | POA: Diagnosis not present

## 2022-04-14 DIAGNOSIS — L989 Disorder of the skin and subcutaneous tissue, unspecified: Secondary | ICD-10-CM

## 2022-04-14 DIAGNOSIS — K148 Other diseases of tongue: Secondary | ICD-10-CM | POA: Diagnosis not present

## 2022-04-14 DIAGNOSIS — Z8673 Personal history of transient ischemic attack (TIA), and cerebral infarction without residual deficits: Secondary | ICD-10-CM

## 2022-04-14 MED ORDER — DOXYCYCLINE HYCLATE 100 MG PO TABS
100.0000 mg | ORAL_TABLET | Freq: Two times a day (BID) | ORAL | 0 refills | Status: AC
Start: 2022-04-14 — End: 2022-04-24

## 2022-04-14 NOTE — Patient Instructions (Signed)
  Schedule dental appointment for your growth on your tongue.   A referral was placed today for dermatology and also lung cancer screening.  Please let us know if you have not heard back within 2 weeks about the referral.   Regards,   Chassidy Layson FNP-C

## 2022-04-14 NOTE — Assessment & Plan Note (Addendum)
Stable  Declines further medication at this time Declines therapy

## 2022-04-14 NOTE — Progress Notes (Unsigned)
Established Patient Office Visit  Subjective:      CC:  Chief Complaint  Patient presents with   Medical Management of Chronic Issues    HPI: [name] is a 67 y.o. female presenting on 04/14/2022 for Medical Management of Chronic Issues . Adjustment reaction, grief, depression: started prozac last visit however stopped after taking a headache. Pt does not want to start any more medication for now, declines therapy.   Has two skin lesions on her face, lower right chin/mandible and on right lower cheek. She does not currently have a dermatologist.   New complaints: 6-7 days of cough, chest congestion, and sore throat with hoarseness. Not really improving. Taking flonase and allegra with only mild relief.   She does also report on a regular basis, worsening DOE and shortness of breath to where she has to stop walking and sit down.  mMRC dyspnea scale, asked in office, on a scale 0-4  0 I only get breathless with strenuous exercise  1 I get short of breath when hurrying on level ground or walking up a slight hill  2 on level ground, I walk slower than people of the same age because of breathlessness    or have to stop for breath when walking my own pace  3 I stop for breath after walking about 100 yards or after a few minutes on level ground  4 I am too breathless to leave the house or I am breathless when dressing   Have you had 2 or more moderate exacerbations or at least 1 hospitalization for COPD exacerbation in the past year? No   Does the patient have high peripheral eosinophil levels (>300 ): unknown, will order today.  ------------------------------------  Cigarette smoker: for 59 years.      Social history:  Relevant past medical, surgical, family and social history reviewed and updated as indicated. Interim medical history since our last visit reviewed.  Allergies and medications reviewed and updated.  DATA REVIEWED: CHART IN EPIC     ROS:  Negative unless specifically indicated above in HPI.    Current Outpatient Medications:    acetaminophen (TYLENOL) 325 MG tablet, Take 2 tablets (650 mg total) by mouth every 4 (four) hours as needed for mild pain (or temp > 37.5 C (99.5 F))., Disp: , Rfl:    amLODipine (NORVASC) 5 MG tablet, TAKE 1 TABLET BY MOUTH DAILY, Disp: 100 tablet, Rfl: 2   aspirin EC 81 MG EC tablet, Take 1 tablet (81 mg total) by mouth daily. Swallow whole., Disp: 30 tablet, Rfl: 11   atorvastatin (LIPITOR) 40 MG tablet, Take 40 mg by mouth every other day., Disp: , Rfl:    Budeson-Glycopyrrol-Formoterol (BREZTRI AEROSPHERE) 160-9-4.8 MCG/ACT AERO, Inhale 2 puffs into the lungs 2 (two) times daily., Disp: 10.7 g, Rfl: 11   cyanocobalamin (VITAMIN B12) 1000 MCG/ML injection, Inject 1,000 mcg into the muscle once. Patient's first dose was 10/23/2021, Disp: , Rfl:    doxycycline (VIBRA-TABS) 100 MG tablet, Take 1 tablet (100 mg total) by mouth 2 (two) times daily for 10 days., Disp: 20 tablet, Rfl: 0   valACYclovir (VALTREX) 500 MG tablet, TAKE 1 TABLET BY MOUTH DAILY, Disp: 100 tablet, Rfl: 2      Objective:    BP 116/66 (BP Location: Left Arm)   Pulse 78   Temp 97.9 F (36.6 C) (Temporal)   Ht 5\' 7"  (1.702 m)   Wt 136 lb 6.4 oz (61.9 kg)   SpO2 98%  BMI 21.36 kg/m   Wt Readings from Last 3 Encounters:  04/14/22 136 lb 6.4 oz (61.9 kg)  04/11/22 134 lb 6 oz (61 kg)  02/24/22 130 lb 14.4 oz (59.4 kg)    Physical Exam Constitutional:      General: She is not in acute distress.    Appearance: Normal appearance. She is normal weight. She is not ill-appearing, toxic-appearing or diaphoretic.  HENT:     Head: Normocephalic.     Nose: Nose normal.     Mouth/Throat:     Mouth: Mucous membranes are moist.  Eyes:     Pupils: Pupils are equal, round, and reactive to light.  Cardiovascular:     Rate and Rhythm: Normal rate and regular rhythm.  Pulmonary:     Effort: Pulmonary effort is normal.     Breath  sounds: Normal breath sounds.  Musculoskeletal:        General: Normal range of motion.  Skin:    Comments: Annual brown pigmented annual lesion flat right lower cheek and right submandibular area   White/brown discoloration , coating on length of tongue, hard and unable to brush off  Neurological:     General: No focal deficit present.     Mental Status: She is alert and oriented to person, place, and time. Mental status is at baseline.  Psychiatric:        Mood and Affect: Mood normal.        Behavior: Behavior normal.        Thought Content: Thought content normal.        Judgment: Judgment normal.              Assessment & Plan:  Skin lesion of face Assessment & Plan: Referral placed for dermatology as recent changes   Orders: -     Ambulatory referral to Dermatology  Bronchitis -     Doxycycline Hyclate; Take 1 tablet (100 mg total) by mouth 2 (two) times daily for 10 days.  Dispense: 20 tablet; Refill: 0  Adjustment reaction with anxiety and depression Assessment & Plan: Stable  Declines further medication at this time Declines therapy    History of tobacco abuse -     Ambulatory Referral for Lung Cancer Scre  Tongue lesion Assessment & Plan: Treatment failure with nystatin, not expected to be thrush. Advised pt to consult with dentist to evaluate further.   Ddx leukoplakia increased risk with h/o smoking   History of CVA (cerebrovascular accident)  Pulmonary emphysema, unspecified emphysema type Assessment & Plan: Setting pt up for spirometry pending results.  With bronchitic exacerbation today.  RX doxycycline 100 mg po bid x 10 days     Current non-smoker but past smoking history unknown Assessment & Plan: Referral placed for lung cancer screening       Return in about 3 months (around 07/14/2022) for f/u copd .  Mort Sawyers, MSN, APRN, FNP-C [facility]

## 2022-04-16 ENCOUNTER — Ambulatory Visit (INDEPENDENT_AMBULATORY_CARE_PROVIDER_SITE_OTHER): Payer: 59

## 2022-04-16 DIAGNOSIS — K148 Other diseases of tongue: Secondary | ICD-10-CM | POA: Insufficient documentation

## 2022-04-16 DIAGNOSIS — Z8673 Personal history of transient ischemic attack (TIA), and cerebral infarction without residual deficits: Secondary | ICD-10-CM | POA: Insufficient documentation

## 2022-04-16 DIAGNOSIS — J439 Emphysema, unspecified: Secondary | ICD-10-CM

## 2022-04-16 DIAGNOSIS — Z87891 Personal history of nicotine dependence: Secondary | ICD-10-CM | POA: Insufficient documentation

## 2022-04-16 DIAGNOSIS — I69354 Hemiplegia and hemiparesis following cerebral infarction affecting left non-dominant side: Secondary | ICD-10-CM | POA: Insufficient documentation

## 2022-04-16 MED ORDER — ALBUTEROL SULFATE (2.5 MG/3ML) 0.083% IN NEBU
2.5000 mg | INHALATION_SOLUTION | Freq: Once | RESPIRATORY_TRACT | Status: AC
Start: 2022-04-16 — End: 2022-04-16
  Administered 2022-04-16: 2.5 mg via RESPIRATORY_TRACT

## 2022-04-16 NOTE — Assessment & Plan Note (Addendum)
Treatment failure with nystatin, not expected to be thrush. Advised pt to consult with dentist to evaluate further.   Ddx leukoplakia increased risk with h/o smoking

## 2022-04-16 NOTE — Assessment & Plan Note (Addendum)
Setting pt up for spirometry pending results.  With bronchitic exacerbation today.  RX doxycycline 100 mg po bid x 10 days

## 2022-04-16 NOTE — Assessment & Plan Note (Signed)
Referral placed for dermatology as recent changes

## 2022-04-16 NOTE — Progress Notes (Signed)
Pre and post spirometry ordered by Mort Sawyers FNP  preformed today. Patient was given instructions for procedure and all questions were answered. Patient was able to preformed pre test with no issues. Patient was administered albuteral nebulizer treatment and after 15 minute wait post spirometry was preformed. There was an error with system and could not get saved so both have been printed off and given to [name] for review.

## 2022-04-16 NOTE — Assessment & Plan Note (Signed)
Referral placed for lung cancer screening.  

## 2022-04-17 NOTE — Progress Notes (Signed)
Was Unable to interpret unfortunately can we have pt come in and repeat test?

## 2022-04-21 DIAGNOSIS — E042 Nontoxic multinodular goiter: Secondary | ICD-10-CM | POA: Diagnosis not present

## 2022-04-21 DIAGNOSIS — R768 Other specified abnormal immunological findings in serum: Secondary | ICD-10-CM | POA: Diagnosis not present

## 2022-04-22 LAB — TSH: TSH: 3.37 u[IU]/mL (ref 0.450–4.500)

## 2022-04-22 LAB — T4, FREE: Free T4: 1.42 ng/dL (ref 0.82–1.77)

## 2022-04-22 LAB — T3, FREE: T3, Free: 3.1 pg/mL (ref 2.0–4.4)

## 2022-04-24 ENCOUNTER — Other Ambulatory Visit: Payer: Self-pay

## 2022-04-24 ENCOUNTER — Ambulatory Visit (INDEPENDENT_AMBULATORY_CARE_PROVIDER_SITE_OTHER): Payer: 59

## 2022-04-24 ENCOUNTER — Other Ambulatory Visit: Payer: Self-pay | Admitting: *Deleted

## 2022-04-24 ENCOUNTER — Ambulatory Visit (INDEPENDENT_AMBULATORY_CARE_PROVIDER_SITE_OTHER): Payer: 59 | Admitting: Sports Medicine

## 2022-04-24 ENCOUNTER — Ambulatory Visit (INDEPENDENT_AMBULATORY_CARE_PROVIDER_SITE_OTHER): Payer: 59 | Admitting: Orthopaedic Surgery

## 2022-04-24 DIAGNOSIS — M25552 Pain in left hip: Secondary | ICD-10-CM

## 2022-04-24 DIAGNOSIS — M1612 Unilateral primary osteoarthritis, left hip: Secondary | ICD-10-CM | POA: Diagnosis not present

## 2022-04-24 DIAGNOSIS — Z122 Encounter for screening for malignant neoplasm of respiratory organs: Secondary | ICD-10-CM

## 2022-04-24 DIAGNOSIS — Z87891 Personal history of nicotine dependence: Secondary | ICD-10-CM

## 2022-04-24 MED ORDER — LIDOCAINE HCL 1 % IJ SOLN
4.0000 mL | INTRAMUSCULAR | Status: AC | PRN
Start: 2022-04-24 — End: 2022-04-24
  Administered 2022-04-24: 4 mL

## 2022-04-24 MED ORDER — METHYLPREDNISOLONE ACETATE 40 MG/ML IJ SUSP
80.0000 mg | INTRAMUSCULAR | Status: AC | PRN
Start: 2022-04-24 — End: 2022-04-24
  Administered 2022-04-24: 80 mg via INTRA_ARTICULAR

## 2022-04-24 NOTE — Progress Notes (Signed)
Office Visit Note   Patient: [name]           Date of Birth: [birth year]           MRN: [id number] Visit Date: 04/24/2022              Requested by: Mort Sawyers, FNP [address] [place],  [place] [address] PCP: Mort Sawyers, FNP   Assessment & Plan: Visit Diagnoses:  1. Pain in left hip     Plan: Impression is left hip arthritis.  Today we discussed various treatment options to include repeat cortisone injection versus total hip arthroplasty.  She is not interested at all with surgery at this time but is willing to undergo cortisone injection.  She will follow-up with [place] as needed.  Follow-Up Instructions: Return if symptoms worsen or fail to improve.   Orders:  Orders Placed This Encounter  Procedures   XR HIP UNILAT W OR W/O PELVIS 1V LEFT   No orders of the defined types were placed in this encounter.     Procedures: No procedures performed   Clinical Data: No additional findings.   Subjective: No chief complaint on file.   HPI patient is a pleasant 67 year old female who comes in today with left hip pain for the past few years which has progressively worsened.  The pain she has is to the groin and anterior thigh which stops at the knee.  She does note a CVA 3 years ago for which she has left-sided weakness.  The pain she gets in the hip primarily occurs when she goes from a seated to standing position as well as the first few steps after standing.  She takes an occasional ibuprofen with mild relief.  She tells me she is unable to take a moderate amount as she only has 1 kidney.  She also tells me she has a history of what sounds like left-sided trochanteric bursitis for which she underwent cortisone injection in the past.  She also thinks she may have undergone left intra-articular hip injection which provided about 1 weeks relief.  This was prior to January of this year.  Review of Systems as detailed in HPI.  All others reviewed and are  negative.   Objective: Vital Signs: There were no vitals taken for this visit.  Physical Exam well-developed well-nourished female no acute distress.  Alert and oriented x 3.  Ortho Exam left hip exam reveals marked pain with logroll, FADIR and Stinchfield.  She is neurovascular intact distally.  Specialty Comments:  No specialty comments available.  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   Elevated serum creatinine 11/26/2021   Elevated antinuclear antibody (ANA) level 11/26/2021   Seasonal allergic rhinitis due to pollen 05/22/2021   Current non-smoker but past smoking history unknown 05/22/2021   History of hyperparathyroidism 05/22/2021   Mixed hyperlipidemia 05/22/2021   Elevated hemoglobin 05/22/2021   Pulmonary emphysema 05/22/2021   Stage 3a chronic kidney disease 05/22/2021  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/27/2020   Constipation 07/08/2018   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   Essential hypertension, benign 05/16/2014   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   Right thyroid nodule 12/22/2013   History of nephrectomy 08/10/2013   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   Migraines    Stroke 09/19/2019   Left side & vision   Thyroid disease    Hx of     Family  History  Problem Relation Age of Onset   Hypertension Mother    Heart disease Mother        Irregular heart beat    Arrhythmia Mother    Rheum arthritis Mother    Pulmonary fibrosis Mother    Osteoporosis Mother    Colon cancer Mother    Lung cancer Father    COPD Father    Pulmonary fibrosis Father    Hypertension Sister    Hypertension Sister    Aneurysm Brother 11       brain   Heart disease Maternal Grandmother    Heart disease Maternal Aunt    Pulmonary fibrosis Maternal Aunt    Rheum arthritis Maternal Uncle    Pulmonary fibrosis Maternal Uncle    Diabetes Maternal Uncle    Colon polyps Neg Hx     Past Surgical History:  Procedure Laterality Date   ABDOMINAL HYSTERECTOMY  1991   partial, still with right ovary   BLADDER SURGERY     bladder tac, then bladder tack reversal due to adhesions.   DORSAL COMPARTMENT RELEASE Right 02/07/2013   Procedure: RIGHT WRIST DEQUERVAIN RELEASE;  Surgeon: Jodi Marble, MD;  Location: [facility];  Service: Orthopedics;  Laterality: Right;   KIDNEY DONATION  2008   gave her lt kidney to daughter   NECK MASS EXCISION     rt-mass   NECK SURGERY  1990   parotidectomy, one gland removed   SHOULDER ARTHROSCOPY WITH OPEN ROTATOR CUFF REPAIR AND DISTAL CLAVICLE ACROMINECTOMY Right 04/25/2014   Procedure: SHOULDER ARTHROSCOPY WITH MINI-OPEN ROTATOR CUFF REPAIR AND DISTAL CLAVICLE RESECTION;  Surgeon: Valeria Batman, MD;  Location: [facility];  Service: Orthopedics;  Laterality: Right;   SHOULDER CLOSED REDUCTION Right 08/31/2014   Procedure: CLOSED MANIPULATION SHOULDER;  Surgeon: Valeria Batman, MD;  Location: [facility];  Service: Orthopedics;  Laterality: Right;   SUBACROMIAL DECOMPRESSION Right 04/25/2014   Procedure: SUBACROMIAL DECOMPRESSION;  Surgeon: Valeria Batman, MD;  Location: [facility];  Service: Orthopedics;  Laterality: Right;   THYROID SURGERY     bx  right thyroid   Social History   Occupational History   Occupation: Disabled    Employer: [employer]    Comment: assembly line  Tobacco Use   Smoking status: Former    Packs/day: 0.25    Years: 54.00    Additional pack years: 0.00    Total pack years: 13.50    Types: Cigarettes, E-cigarettes    Quit date: 10/25/2014    Years since quitting: 7.5   Smokeless tobacco: Never  Vaping Use   Vaping Use: Never used  Substance and Sexual Activity   Alcohol use: No   Drug use: No   Sexual activity: Not Currently

## 2022-04-24 NOTE — Progress Notes (Signed)
   Procedure Note  Patient: [name]             Date of Birth: 1955-04-01           MRN: [id number]             Visit Date: 04/24/2022  Procedures: Visit Diagnoses:  1. Pain in left hip   2. Unilateral primary osteoarthritis, left hip    Large Joint Inj: L hip joint on 04/24/2022 10:08 AM Indications: pain Details: 22 G 3.5 in needle, ultrasound-guided anterior approach Medications: 4 mL lidocaine 1 %; 80 mg methylPREDNISolone acetate 40 MG/ML Outcome: tolerated well, no immediate complications  Procedure: US-guided intra-articular hip injection, left After discussion on risks/benefits/indications and informed verbal consent was obtained, a timeout was performed. Patient was lying supine on exam table. The hip was cleaned with betadine and alcohol swabs. Then utilizing ultrasound guidance, the patient's femoral head and neck junction was identified and subsequently injected with 4:2 lidocaine:depomedrol via an in-plane approach with ultrasound visualization of the injectate administered into the hip joint. Patient tolerated procedure well without immediate complications.  Procedure, treatment alternatives, risks and benefits explained, specific risks discussed. Consent was given by the patient. Immediately prior to procedure a time out was called to verify the correct patient, procedure, equipment, support staff and site/side marked as required. Patient was prepped and draped in the usual sterile fashion.    - I evaluated the patient about 10 minutes post-injection and she had improvement in pain and range of motion - follow-up with Dr. Roda Shutters and [name] as indicated; I am happy to see them as needed  Madelyn Brunner, DO Primary Care Sports Medicine Physician  [facility] - Orthopedics  This note was dictated using Dragon naturally speaking software and may contain errors in syntax, spelling, or content which have not been identified prior to signing this note.

## 2022-04-28 ENCOUNTER — Ambulatory Visit (INDEPENDENT_AMBULATORY_CARE_PROVIDER_SITE_OTHER): Payer: 59 | Admitting: Nurse Practitioner

## 2022-04-28 ENCOUNTER — Encounter: Payer: Self-pay | Admitting: Nurse Practitioner

## 2022-04-28 VITALS — BP 108/70 | HR 81 | Ht 67.0 in | Wt 137.8 lb

## 2022-04-28 DIAGNOSIS — R768 Other specified abnormal immunological findings in serum: Secondary | ICD-10-CM

## 2022-04-28 DIAGNOSIS — R7989 Other specified abnormal findings of blood chemistry: Secondary | ICD-10-CM | POA: Diagnosis not present

## 2022-04-28 DIAGNOSIS — E042 Nontoxic multinodular goiter: Secondary | ICD-10-CM | POA: Diagnosis not present

## 2022-04-28 NOTE — Progress Notes (Signed)
04/28/2022     Endocrinology Follow Up Note    Subjective:    Patient ID: [name], female    DOB: 12/22/1955, PCP Mort Sawyers, FNP.   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   DORSAL COMPARTMENT RELEASE Right 02/07/2013   Procedure: RIGHT WRIST DEQUERVAIN RELEASE;  Surgeon: Jodi Marble, MD;  Location: [facility];  Service: Orthopedics;  Laterality: Right;   KIDNEY DONATION  2008   gave her lt kidney to daughter   NECK MASS EXCISION     rt-mass   NECK SURGERY  1990   parotidectomy, one gland removed   SHOULDER ARTHROSCOPY WITH OPEN ROTATOR CUFF REPAIR AND DISTAL CLAVICLE ACROMINECTOMY Right 04/25/2014   Procedure: SHOULDER ARTHROSCOPY WITH MINI-OPEN ROTATOR CUFF REPAIR AND DISTAL CLAVICLE RESECTION;  Surgeon: Valeria Batman, MD;  Location: [facility];  Service: Orthopedics;  Laterality: Right;   SHOULDER CLOSED REDUCTION Right 08/31/2014   Procedure: CLOSED MANIPULATION SHOULDER;  Surgeon: Valeria Batman, MD;  Location: [facility];  Service: Orthopedics;  Laterality: Right;   SUBACROMIAL DECOMPRESSION Right 04/25/2014   Procedure: SUBACROMIAL DECOMPRESSION;  Surgeon: Valeria Batman, MD;  Location: [facility];  Service: Orthopedics;  Laterality: Right;   THYROID SURGERY     bx right thyroid    Social History   Socioeconomic History   Marital status: Single    Spouse name: Not on file   Number of children: 5   Years of education:  Not on file   Highest education level: 11th grade  Occupational History   Occupation: Disabled    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   Lives with son and his family   Right Handed    Drinks 3-4 cups caffeine daily      Social Determinants of Health   Financial Resource Strain: Low Risk  (04/10/2022)   Overall Financial Resource Strain (CARDIA)    Difficulty of Paying Living Expenses: Not hard at all  Food Insecurity: No Food Insecurity (04/10/2022)   Hunger Vital Sign    Worried About Running Out of Food in the Last Year: Never true    Ran Out of Food in the Last  Year: Never true  Transportation Needs: No Transportation Needs (04/10/2022)   PRAPARE - Administrator, Civil Service (Medical): No    Lack of Transportation (Non-Medical): No  Physical Activity: Inactive (04/10/2022)   Exercise Vital Sign    Days of Exercise per Week: 0 days    Minutes of Exercise per Session: 20 min  Stress: Stress Concern Present (04/10/2022)   [employer] of Occupational Health - Occupational Stress Questionnaire    Feeling of Stress : To some extent  Social Connections: Socially Isolated (04/10/2022)   Social Connection and Isolation Panel [NHANES]    Frequency of Communication with Friends and Family: More than three times a week    Frequency of Social Gatherings with Friends and Family: More than three times a week    Attends Religious Services: Never    Database administrator or  Organizations: No    Attends Engineer, structural: Not on file    Marital Status: Divorced    Family History  Problem Relation Age of Onset   Hypertension Mother    Heart disease Mother        Irregular heart beat    Arrhythmia Mother    Rheum arthritis Mother    Pulmonary fibrosis Mother    Osteoporosis Mother    Colon cancer Mother    Lung cancer Father    COPD Father    Pulmonary fibrosis Father    Hypertension Sister    Hypertension Sister    Aneurysm Brother 11       brain   Heart disease Maternal Grandmother    Heart disease Maternal Aunt    Pulmonary fibrosis Maternal Aunt    Rheum arthritis Maternal Uncle    Pulmonary fibrosis Maternal Uncle    Diabetes Maternal Uncle    Colon polyps Neg Hx     Outpatient Encounter Medications as of 04/28/2022  Medication Sig   acetaminophen (TYLENOL) 325 MG tablet Take 2 tablets (650 mg total) by mouth every 4 (four) hours as needed for mild pain (or temp > 37.5 C (99.5 F)).   amLODipine (NORVASC) 5 MG tablet TAKE 1 TABLET BY MOUTH DAILY   aspirin EC 81 MG EC tablet Take 1 tablet (81 mg total) by mouth daily. Swallow whole.   atorvastatin (LIPITOR) 40 MG tablet Take 40 mg by mouth every other day.   fexofenadine (ALLEGRA) 180 MG tablet Take 180 mg by mouth daily.   fluticasone (FLONASE) 50 MCG/ACT nasal spray Place 1 spray into both nostrils daily.   Budeson-Glycopyrrol-Formoterol (BREZTRI AEROSPHERE) 160-9-4.8 MCG/ACT AERO Inhale 2 puffs into the lungs 2 (two) times daily. (Patient not taking: Reported on 04/28/2022)   cyanocobalamin (VITAMIN B12) 1000 MCG/ML injection Inject 1,000 mcg into the muscle once. Patient's first dose was 10/23/2021 (Patient not taking: Reported on 04/28/2022)   [EXPIRED] doxycycline (VIBRA-TABS) 100 MG tablet Take 1 tablet (100 mg total) by mouth 2 (two) times daily for 10 days.   valACYclovir (VALTREX) 500 MG tablet TAKE 1 TABLET BY MOUTH DAILY (Patient not taking: Reported on 04/28/2022)   No  facility-administered encounter medications on file as of 04/28/2022.    ALLERGIES: Allergies  Allergen Reactions   Celebrex [Celecoxib] Anaphylaxis and Hives   Sulfonamide Derivatives Anaphylaxis   Ace Inhibitors Cough   Dilaudid [Hydromorphone] Nausea And Vomiting    Severe N/V   Erythromycin Nausea And Vomiting   Gabapentin     Patient has a sulfur allergy   Lactose Intolerance (Gi) Diarrhea  cramping   Prozac [Fluoxetine] Other (See Comments)    headaches   Sudafed [Pseudoephedrine Hcl]     Makes "high"   Losartan Palpitations   Mucinex [Guaifenesin Er] Palpitations    VACCINATION STATUS: Immunization History  Administered Date(s) Administered   Fluad Quad(high Dose 65+) 10/23/2021   Influenza Whole 10/15/2006   Influenza,inj,Quad PF,6+ Mos 09/19/2013, 10/02/2014   Influenza-Unspecified 10/22/2017, 10/21/2020   Pneumococcal Conjugate-13 09/19/2013   Pneumococcal-Unspecified 01/07/2008   Tdap 01/07/2008     HPI  DENICIA PAGLIARULO is 67 y.o. female who presents today with a medical history as above. she is being seen in consultation for hyperthyroidism requested by Mort Sawyers, FNP.  she has been dealing with symptoms of cough, fatigue, tremors, hoarseness, dizziness, and unintentional weight loss intermittently since she had her stroke in 2021. These symptoms are progressively worsening and troubling to her.  her most recent thyroid labs revealed suppressed TSH of 0.031 on 02/11/21 (corresponding Free T4 level was 1.59).  During her chart review, it is noted that she saw Endocrinology back in 2015, had thyroid ultrasound which showed multiple small thyroid nodules, none of which were suspicious for cancer but it also showed hypervascularity recommending uptake and scan if clinically relevant.  It appears she also had a thyroid biopsy in the past, although the specifics surrounding this aren't available.  Her TPO antibodies were positive at that time as well, indicating  high likelihood of autoimmune dysfunction.  she denies dysphagia, choking.  She does note voice change and gets SOB with exertion.    she does have family history of thyroid dysfunction in her sister and one of her nephews, but denies family hx of thyroid cancer.  she is not on any anti-thyroid medications nor on any thyroid hormone supplements. Denies use of Biotin containing supplements.  she is willing to proceed with appropriate work up and therapy for thyrotoxicosis.   Review of systems  Constitutional: + Minimally fluctuating body weight, current Body mass index is 21.58 kg/m., no fatigue, no subjective hyperthermia, no subjective hypothermia Eyes: + blurry vision, no xerophthalmia ENT:  no nodules palpated in throat, no dysphagia/odynophagia, no hoarseness, intermittent soreness of right lower neck (previous thyroid nodule biopsy site) Cardiovascular: no chest pain, intermittent shortness of breath-mostly with exertion, no palpitations, no leg swelling Respiratory: + intermittent shortness of breath-usually with exertion Gastrointestinal: no nausea/vomiting/diarrhea Musculoskeletal: no muscle/joint aches Skin: no rashes, no hyperemia Neurological: no tremors, no numbness, no tingling Psychiatric: no depression, no anxiety   Objective:    BP 108/70 (BP Location: Left Arm, Patient Position: Sitting, Cuff Size: Normal)   Pulse 81   Ht 5\' 7"  (1.702 m)   Wt 137 lb 12.8 oz (62.5 kg)   BMI 21.58 kg/m   Wt Readings from Last 3 Encounters:  04/28/22 137 lb 12.8 oz (62.5 kg)  04/14/22 136 lb 6.4 oz (61.9 kg)  04/11/22 134 lb 6 oz (61 kg)     BP Readings from Last 3 Encounters:  04/28/22 108/70  04/14/22 116/66  04/11/22 120/76                       Physical Exam- Limited  Constitutional:  Body mass index is 21.58 kg/m. , not in acute distress, normal state of mind Eyes:  EOMI, no exophthalmos Neck: Supple Thyroid: + gross goiter- left> right; surgical scar noted to right  lateral neck from previous parotidectomy Cardiovascular: RRR, no murmurs, rubs, or gallops, no edema Respiratory: Adequate breathing  efforts, no crackles, rales, rhonchi, or wheezing Musculoskeletal: no gross deformities, strength intact in all four extremities, no gross restriction of joint movements, left arm ataxia-from previous stroke Skin:  no rashes, no hyperemia Neurological: no tremor with outstretched hands   CMP     Component Value Date/Time   NA 142 02/24/2022 1221   K 4.1 02/24/2022 1221   CL 104 02/24/2022 1221   CO2 32 02/24/2022 1221   GLUCOSE 99 02/24/2022 1221   BUN 13 02/24/2022 1221   CREATININE 1.07 02/24/2022 1221   CREATININE 1.22 (H) 11/06/2015 1001   CALCIUM 9.6 02/24/2022 1221   PROT 6.9 02/24/2022 1221   ALBUMIN 4.0 02/24/2022 1221   AST 26 02/24/2022 1221   ALT 24 02/24/2022 1221   ALKPHOS 98 02/24/2022 1221   BILITOT 0.6 02/24/2022 1221   GFRNONAA 52 (L) 11/03/2019 1647   GFRNONAA 56 (L) 06/19/2015 0821   GFRAA >60 09/20/2019 0426   GFRAA 64 06/19/2015 0821     CBC    Component Value Date/Time   WBC 5.7 11/26/2021 1315   RBC 4.89 11/26/2021 1315   HGB 14.7 11/26/2021 1315   HCT 43.1 11/26/2021 1315   PLT 229.0 11/26/2021 1315   MCV 88.1 11/26/2021 1315   MCH 29.3 11/03/2019 1647   MCHC 34.1 11/26/2021 1315   RDW 12.7 11/26/2021 1315   LYMPHSABS 1.4 11/26/2021 1315   MONOABS 0.4 11/26/2021 1315   EOSABS 0.1 11/26/2021 1315   BASOSABS 0.1 11/26/2021 1315     Diabetic Labs (most recent): Lab Results  Component Value Date   HGBA1C 5.3 09/19/2019   HGBA1C 5.5 01/18/2014   MICROALBUR 3.6 (H) 09/19/2013    Lipid Panel     Component Value Date/Time   CHOL 157 02/24/2022 1221   TRIG 128.0 02/24/2022 1221   HDL 58.60 02/24/2022 1221   CHOLHDL 3 02/24/2022 1221   VLDL 25.6 02/24/2022 1221   LDLCALC 73 02/24/2022 1221     Lab Results  Component Value Date   TSH 3.370 04/21/2022   TSH 3.120 10/17/2021   TSH 3.870 06/21/2021    TSH 8.880 (H) 03/26/2021   TSH 0.03 (A) 02/11/2021   TSH 1.09 10/02/2014   TSH 2.81 12/22/2013   TSH 13.89 (H) 09/19/2013   TSH 0.06 (L) 08/09/2013   FREET4 1.42 04/21/2022   FREET4 1.23 10/17/2021   FREET4 1.26 06/21/2021   FREET4 0.87 03/26/2021   FREET4 1.06 10/02/2014   FREET4 1.01 12/22/2013   FREET4 0.72 09/19/2013   FREET4 1.29 08/09/2013     Thyroid US from 09/26/2013 CLINICAL DATA:  Possible left thyroid nodule on physical  examination.   EXAM:  THYROID ULTRASOUND   TECHNIQUE:  Ultrasound examination of the thyroid gland and adjacent soft  tissues was performed.   COMPARISON:  Cervical spine CT 04/30/2003   FINDINGS:  Right thyroid lobe   Measurements: 5.4 x 1.7 x 1.8 cm. Right thyroid tissue is mildly  heterogeneous. There is an isoechoic nodule along the posterior  right thyroid lobe that measures 1.1 x 1.2 x 0.9 cm. Vascularity of  this nodule is similar to the adjacent thyroid parenchyma. No  significant calcifications within this right thyroid nodule.   Left thyroid lobe   Measurements: 6.7 x 1.3 x 1.9 cm. Left thyroid tissue is mildly  heterogeneous but very vascular. Small heterogeneous nodule in the  inferior left thyroid lobe measures 0.3 cm.   Isthmus   Thickness: 0.3 cm.  No nodules visualized.   Lymphadenopathy  None visualized.   IMPRESSION:  The thyroid tissue is mildly enlarged and hypervascular.   A solid nodule in the posterior right thyroid lobe measures up to  1.2 cm. Findings do not meet current SRU consensus criteria for  biopsy. Follow-up by clinical exam is recommended. If patient has  known risk factors for thyroid carcinoma, consider follow-up  ultrasound in 12 months. If patient is clinically hyperthyroid,  consider nuclear medicine thyroid uptake and scan.Reference:  Management of Thyroid Nodules Detected at [place]: Society of  Radiologists in Ultrasound Consensus Conference Statement. Radiology  2005; [id number].     Electronically Signed    By: Richarda Overlie M.D.    On: 09/26/2013 15:42       Thyroid US from 04/09/21 CLINICAL DATA:  Goiter.   EXAM: THYROID ULTRASOUND   TECHNIQUE: Ultrasound examination of the thyroid gland and adjacent soft tissues was performed.   COMPARISON:  CT head and neck from 09/19/2019, thyroid ultrasound from 09/26/2013   FINDINGS: Parenchymal Echotexture: Mildly heterogenous   Isthmus: 0.4 cm, previously 0.3 cm   Right lobe: 4.6 x 1.5 x 1.6 cm, previously 5.4 x 1.7 x 1.8 cm   Left lobe: 4.7 x 1.3 x 1.7 cm, previously 6.7 x 1.3 x 1.9 cm   _________________________________________________________   Estimated total number of nodules >/= 1 cm: 1   Number of spongiform nodules >/=  2 cm not described below (TR1): 0   Number of mixed cystic and solid nodules >/= 1.5 cm not described below (TR2): 0   _________________________________________________________   Nodule # 1:   Prior biopsy: No   Location: Right; Inferior   Maximum size: 2.0 cm; Other 2 dimensions: 1.3 x 1.4 cm, previously, 1.1 x 1.2 x 0.9 cm   Composition: solid/almost completely solid (2)   Echogenicity: hyperechoic (1)   Shape: not taller-than-wide (0)   Margins: extra-thyroidal extension (3)   Echogenic foci: none (0)   ACR TI-RADS total points: 6.   ACR TI-RADS risk category:  TR4 (4-6 points).   Significant change in size (>/= 20% in two dimensions and minimal increase of 2 mm): Yes   Change in features: No   Change in ACR TI-RADS risk category: No   ACR TI-RADS recommendations:   **Given size (>/= 1.5 cm) and appearance, fine needle aspiration of this moderately suspicious nodule should be considered based on TI-RADS criteria.   _________________________________________________________   No cervical lymphadenopathy.   IMPRESSION: Gradual interval enlargement of previously visualized right inferior solid thyroid nodule (labeled 1, 2.0 cm, previously 1.2  cm). Given enlargement, this nodule now technically meets criteria (TI-RADS category 4) for tissue sampling. Consider ultrasound-guided fine-needle aspiration.   The above is in keeping with the ACR TI-RADS recommendations - J Am Coll Radiol 2017;14:587-595.   Marliss Coots, MD   [facility]   [facility]     Electronically Signed   By: Marliss Coots M.D.   On: 04/09/2021 11:27            Latest Reference Range & Units 02/11/21 00:00 03/26/21 14:09 06/21/21 10:44 10/17/21 10:18 04/21/22 10:03  TSH 0.450 - 4.500 uIU/mL 0.03 ! (E) 8.880 (H) 3.870 3.120 3.370  Triiodothyronine,Free,Serum 2.0 - 4.4 pg/mL  2.9 3.8 3.1 3.1  T4,Free(Direct) 0.82 - 1.77 ng/dL  1.61 0.96 0.45 4.09  !: Data is abnormal (H): Data is abnormally high (E): External lab result   Assessment & Plan:   1. Abnormal TSH 2. Positive thyroid antibodies 3.  Multinodular goiter  she is being seen at a kind request of Mort Sawyers, FNP.  Her previsit thyroid function tests continue to show euthyroid presentation.  Will repeat TFTs prior to next visit for surveillance.  Will also order repeat thyroid ultrasound for surveillance of several nodules.  Her follow up ultrasound from 04/09/21 shows significant change in size of a previously noted nodule on right lower lobe, warranting FNA due to moderate suspicion of malignancy.  Her pathology returned showing the nodule was benign, ruling out malignancy.     -Patient is advised to maintain close follow up with Mort Sawyers, FNP for primary care needs.     I spent  41  minutes in the care of the patient today including review of labs from Thyroid Function, CMP, and other relevant labs ; imaging/biopsy records (current and previous including abstractions from other facilities); face-to-face time discussing  her lab results and symptoms, medications doses, her options of short and long term treatment based on the  latest standards of care / guidelines;   and documenting the encounter.  [name]  participated in the discussions, expressed understanding, and voiced agreement with the above plans.  All questions were answered to her satisfaction. she is encouraged to contact clinic should she have any questions or concerns prior to her return visit.    Follow up plan: Return in about 6 months (around 10/28/2022) for Thyroid follow up, Previsit labs, thyroid ultrasound.   Thank you for involving me in the care of this pleasant patient, and I will continue to update you with her progress.   Ronny Bacon, [facility] [address] [place], [place] [address] Phone: [phone] Fax: [phone]  04/28/2022, 11:11 AM

## 2022-05-06 ENCOUNTER — Ambulatory Visit (INDEPENDENT_AMBULATORY_CARE_PROVIDER_SITE_OTHER): Payer: 59

## 2022-05-06 DIAGNOSIS — J439 Emphysema, unspecified: Secondary | ICD-10-CM | POA: Diagnosis not present

## 2022-05-06 MED ORDER — ALBUTEROL SULFATE (2.5 MG/3ML) 0.083% IN NEBU
2.5000 mg | INHALATION_SOLUTION | Freq: Once | RESPIRATORY_TRACT | Status: AC
Start: 2022-05-06 — End: 2022-05-06
  Administered 2022-05-06: 2.5 mg via RESPIRATORY_TRACT

## 2022-05-06 NOTE — Progress Notes (Signed)
No charge for repeat due to error.

## 2022-05-06 NOTE — Progress Notes (Signed)
Pre and post spirometry ordered by Tabitha Dugal, NP  preformed today. Patient was given instructions for procedure and all questions were answered. Patient was able to preformed pre test with no issues. Patient was administered albuteral nebulizer treatment and after 15 minute wait post spirometry was preformed. results sent for provider review.    

## 2022-05-14 ENCOUNTER — Ambulatory Visit (HOSPITAL_COMMUNITY)
Admission: RE | Admit: 2022-05-14 | Discharge: 2022-05-14 | Disposition: A | Discharge status: Home / Self Care | Payer: 59 | Source: Ambulatory Visit | Attending: Nurse Practitioner | Admitting: Nurse Practitioner

## 2022-05-14 DIAGNOSIS — R7989 Other specified abnormal findings of blood chemistry: Secondary | ICD-10-CM | POA: Insufficient documentation

## 2022-05-14 DIAGNOSIS — R768 Other specified abnormal immunological findings in serum: Secondary | ICD-10-CM | POA: Diagnosis not present

## 2022-05-14 DIAGNOSIS — E042 Nontoxic multinodular goiter: Secondary | ICD-10-CM | POA: Insufficient documentation

## 2022-05-14 DIAGNOSIS — E041 Nontoxic single thyroid nodule: Secondary | ICD-10-CM | POA: Diagnosis not present

## 2022-05-20 ENCOUNTER — Encounter: Payer: 59 | Admitting: Acute Care

## 2022-05-22 ENCOUNTER — Other Ambulatory Visit: Payer: 59

## 2022-05-23 ENCOUNTER — Encounter: Payer: Self-pay | Admitting: Family Medicine

## 2022-05-23 ENCOUNTER — Ambulatory Visit (INDEPENDENT_AMBULATORY_CARE_PROVIDER_SITE_OTHER): Payer: 59 | Admitting: Family Medicine

## 2022-05-23 VITALS — BP 92/70 | HR 88 | Temp 98.1°F | Ht 67.0 in | Wt 134.2 lb

## 2022-05-23 DIAGNOSIS — J069 Acute upper respiratory infection, unspecified: Secondary | ICD-10-CM | POA: Diagnosis not present

## 2022-05-23 NOTE — Patient Instructions (Addendum)
Can hold BP med and increase water intake for a while until BP increasing some.  Rest.  Can increase Flonase 2 spray per nostril daily.

## 2022-05-23 NOTE — Progress Notes (Signed)
Patient ID: [name], female    DOB: May 31, 1955, 67 y.o.   MRN: [id number]  This visit was conducted in person.  BP 92/70 (BP Location: Left Arm, Patient Position: Sitting, Cuff Size: Normal)   Pulse 88   Temp 98.1 F (36.7 C) (Temporal)   Ht 5\' 7"  (1.702 m)   Wt 134 lb 4 oz (60.9 kg)   SpO2 97%   BMI 21.03 kg/m    CC:  Chief Complaint  Patient presents with   Nasal Congestion    Started on Saturday   Hoarse   Cough    Subjective:   HPI: [name] is a 67 y.o. female presenting on 05/23/2022 for Nasal Congestion (Started on Saturday), Hoarse, and Cough   Date of onset:  6 days Initial symptoms included  nasal congestion, hoarse voice. No ST. NO Ear  pain, no sinus pressure Has sore on lip... has treated with valtrex for possible fever blister. Symptoms progressed to occ cough. No change SOB, no wheeze.  No new body aches.  No fever.   Minimal water, some tea and coffee. BP Readings from Last 3 Encounters:  05/23/22 92/70  04/28/22 108/70  04/14/22 116/66    Sick contacts:  none COVID testing:   none     She has tried to treat with       Has COPD.. she is not taking Breztri  Former smoker.  Has allergies.. take OT allergy med and flonase     Relevant past medical, surgical, family and social history reviewed and updated as indicated. Interim medical history since our last visit reviewed. Allergies and medications reviewed and updated. Outpatient Medications Prior to Visit  Medication Sig Dispense Refill   acetaminophen (TYLENOL) 325 MG tablet Take 2 tablets (650 mg total) by mouth every 4 (four) hours as needed for mild pain (or temp > 37.5 C (99.5 F)).     amLODipine (NORVASC) 5 MG tablet TAKE 1 TABLET BY MOUTH DAILY 100 tablet 2   aspirin EC 81 MG EC tablet Take 1 tablet (81 mg total) by mouth daily. Swallow whole. 30 tablet 11   atorvastatin (LIPITOR) 40 MG tablet Take 40 mg by mouth every other day.     fexofenadine (ALLEGRA) 180 MG  tablet Take 180 mg by mouth daily.     fluticasone (FLONASE) 50 MCG/ACT nasal spray Place 1 spray into both nostrils daily.     valACYclovir (VALTREX) 500 MG tablet TAKE 1 TABLET BY MOUTH DAILY 100 tablet 2   Budeson-Glycopyrrol-Formoterol (BREZTRI AEROSPHERE) 160-9-4.8 MCG/ACT AERO Inhale 2 puffs into the lungs 2 (two) times daily. (Patient not taking: Reported on 04/28/2022) 10.7 g 11   cyanocobalamin (VITAMIN B12) 1000 MCG/ML injection Inject 1,000 mcg into the muscle once. Patient's first dose was 10/23/2021 (Patient not taking: Reported on 04/28/2022)     No facility-administered medications prior to visit.     Per HPI unless specifically indicated in ROS section below Review of Systems  Constitutional:  Negative for fatigue and fever.  HENT:  Positive for congestion. Negative for sinus pressure, sneezing, sore throat and tinnitus.   Eyes:  Negative for pain.  Respiratory:  Positive for cough. Negative for shortness of breath.   Cardiovascular:  Negative for chest pain, palpitations and leg swelling.  Gastrointestinal:  Negative for abdominal pain.  Genitourinary:  Negative for dysuria and vaginal bleeding.  Musculoskeletal:  Negative for back pain.  Neurological:  Negative for syncope, light-headedness and headaches.  Psychiatric/Behavioral:  Negative for dysphoric mood.    Objective:  BP 92/70 (BP Location: Left Arm, Patient Position: Sitting, Cuff Size: Normal)   Pulse 88   Temp 98.1 F (36.7 C) (Temporal)   Ht 5\' 7"  (1.702 m)   Wt 134 lb 4 oz (60.9 kg)   SpO2 97%   BMI 21.03 kg/m   Wt Readings from Last 3 Encounters:  05/23/22 134 lb 4 oz (60.9 kg)  04/28/22 137 lb 12.8 oz (62.5 kg)  04/14/22 136 lb 6.4 oz (61.9 kg)      Physical Exam Constitutional:      General: She is not in acute distress.    Appearance: She is well-developed. She is not ill-appearing or toxic-appearing.  HENT:     Head: Normocephalic.     Right Ear: Hearing, tympanic membrane, ear canal and  external ear normal. Tympanic membrane is not erythematous, retracted or bulging.     Left Ear: Hearing, tympanic membrane, ear canal and external ear normal. Tympanic membrane is not erythematous, retracted or bulging.     Nose: Mucosal edema and congestion present. No rhinorrhea.     Right Sinus: No maxillary sinus tenderness or frontal sinus tenderness.     Left Sinus: No maxillary sinus tenderness or frontal sinus tenderness.     Mouth/Throat:     Pharynx: Uvula midline.  Eyes:     General: Lids are normal. Lids are everted, no foreign bodies appreciated.     Conjunctiva/sclera: Conjunctivae normal.     Pupils: Pupils are equal, round, and reactive to light.  Neck:     Thyroid: No thyroid mass or thyromegaly.     Vascular: No carotid bruit.     Trachea: Trachea normal.  Cardiovascular:     Rate and Rhythm: Normal rate and regular rhythm.     Pulses: Normal pulses.     Heart sounds: Normal heart sounds, S1 normal and S2 normal. No murmur heard.    No friction rub. No gallop.  Pulmonary:     Effort: Pulmonary effort is normal. No tachypnea or respiratory distress.     Breath sounds: Normal breath sounds. No decreased breath sounds, wheezing, rhonchi or rales.  Musculoskeletal:     Cervical back: Normal range of motion and neck supple.  Skin:    General: Skin is warm and dry.     Findings: No rash.  Neurological:     Mental Status: She is alert.  Psychiatric:        Mood and Affect: Mood is not anxious or depressed.        Speech: Speech normal.        Behavior: Behavior normal. Behavior is cooperative.        Judgment: Judgment normal.       Results for orders placed or performed in visit on 04/11/22  POC COVID-19 BinaxNow  Result Value Ref Range   SARS Coronavirus 2 Ag Negative Negative  Rapid Strep A  Result Value Ref Range   Rapid Strep A Screen Negative Negative    Assessment and Plan  Viral URI Assessment & Plan: Acute, most likely viral infection versus  allergy rhinitis. No clear sign of bacterial superinfection. Recommended increasing Flonase to 2 sprays per nostril daily, consider nasal saline irrigation.  Recommended Mucinex but she does not tolerate this well.  If she is not continuing to improve through the weekend or if cough becomes productive, she will call for possible consideration of antibiotics next week. Return and ER precautions provided  No follow-ups on file.   Kerby Nora, MD

## 2022-05-23 NOTE — Assessment & Plan Note (Signed)
Acute, most likely viral infection versus allergy rhinitis. No clear sign of bacterial superinfection. Recommended increasing Flonase to 2 sprays per nostril daily, consider nasal saline irrigation.  Recommended Mucinex but she does not tolerate this well.  If she is not continuing to improve through the weekend or if cough becomes productive, she will call for possible consideration of antibiotics next week. Return and ER precautions provided

## 2022-05-27 ENCOUNTER — Other Ambulatory Visit: Payer: Self-pay | Admitting: Physician Assistant

## 2022-05-27 ENCOUNTER — Telehealth: Payer: Self-pay

## 2022-05-27 MED ORDER — METHOCARBAMOL 750 MG PO TABS: 750.0000 mg | ORAL_TABLET | Freq: Two times a day (BID) | ORAL | 1 refills | Status: DC | PRN

## 2022-05-27 NOTE — Telephone Encounter (Signed)
Sure, pharmacy on file is mail order. What pharmacy does she prefer?

## 2022-05-27 NOTE — Telephone Encounter (Signed)
sent 

## 2022-05-27 NOTE — Telephone Encounter (Signed)
Called pt she wants it sent to the [id number] - [place], [place] - [address]

## 2022-05-27 NOTE — Telephone Encounter (Signed)
Patient called wanting to know if she can get a muscle relaxer sent to her pharmacy.  Stated that she has been having leg pain when doing to much and has trouble walking when she gets this pain.  CB# [phone].  Please advise.  Thank you.

## 2022-06-26 ENCOUNTER — Ambulatory Visit: Payer: 59 | Admitting: Orthopedic Surgery

## 2022-07-08 ENCOUNTER — Encounter: Payer: Self-pay | Admitting: Physician Assistant

## 2022-07-08 ENCOUNTER — Ambulatory Visit (INDEPENDENT_AMBULATORY_CARE_PROVIDER_SITE_OTHER): Payer: 59 | Admitting: Physician Assistant

## 2022-07-08 ENCOUNTER — Other Ambulatory Visit (INDEPENDENT_AMBULATORY_CARE_PROVIDER_SITE_OTHER): Payer: 59

## 2022-07-08 DIAGNOSIS — M79605 Pain in left leg: Secondary | ICD-10-CM

## 2022-07-08 DIAGNOSIS — G8929 Other chronic pain: Secondary | ICD-10-CM

## 2022-07-08 DIAGNOSIS — M25562 Pain in left knee: Secondary | ICD-10-CM | POA: Diagnosis not present

## 2022-07-08 MED ORDER — BACLOFEN 10 MG PO TABS: 10.0000 mg | ORAL_TABLET | Freq: Two times a day (BID) | ORAL | 0 refills | Status: DC | PRN

## 2022-07-08 MED ORDER — PREDNISONE 10 MG (21) PO TBPK
ORAL_TABLET | ORAL | 0 refills | Status: DC
Start: 2022-07-08 — End: 2022-07-17

## 2022-07-08 NOTE — Progress Notes (Signed)
Office Visit Note   Patient: [name]           Date of Birth: [birth year]           MRN: [id number] Visit Date: 07/08/2022              Requested by: Mort Sawyers, FNP [address] [place],  [place] [address] PCP: Mort Sawyers, FNP   Assessment & Plan: Visit Diagnoses:  1. Left leg pain     Plan: Impression is left lateral leg pain from the thigh to the lower leg.  I believe her symptoms are referred from her back at this time.  We have discussed starting her on a steroid pack and I will change her muscle relaxer.  We have also discussed referral to outpatient physical therapy for which she is agreeable to.  If her symptoms worsen or do not improve she will let us know.  Follow-up as needed.  Follow-Up Instructions: Return if symptoms worsen or fail to improve.   Orders:  Orders Placed This Encounter  Procedures   XR KNEE 3 VIEW LEFT   XR Lumbar Spine 2-3 Views   Meds ordered this encounter  Medications   predniSONE (STERAPRED UNI-PAK 21 TAB) 10 MG (21) TBPK tablet    Sig: Take as directed    Dispense:  21 tablet    Refill:  0   baclofen (LIORESAL) 10 MG tablet    Sig: Take 1 tablet (10 mg total) by mouth 2 (two) times daily as needed for muscle spasms.    Dispense:  20 each    Refill:  0      Procedures: No procedures performed   Clinical Data: No additional findings.   Subjective: Chief Complaint  Patient presents with   Left Knee - Pain    HPI patient is a pleasant 67 year old female who comes in today with pain from the left lateral thigh to the left lateral lower leg.  This has been ongoing since January of this year.  She notes her mother passed away and had to do a lot of cleaning out of her house.  Her symptoms have progressively worsened.  Pain is constant but worse going from a seated to standing position.  She does have relief when she is walking.  She denies any weakness to the left lower extremity.  She has been taking ibuprofen  and [place] without relief.  No bowel or bladder change or saddle paresthesias.  She has previously undergone left hip joint injection as well as left hip trochanteric bursa injection without relief.  She has a history of CVA which is affected her left lower extremity.  Review of Systems as detailed in HPI.  All others reviewed and are negative.   Objective: Vital Signs: There were no vitals taken for this visit.  Physical Exam well-developed well-nourished female in no acute distress.  Alert and oriented x 3.  Ortho Exam left hip exam reveals pain with logroll and FADIR.  Tenderness to the greater trochanter.  Significantly positive straight leg raise.  No pain with lumbar flexion or extension.  No spinous or paraspinous tenderness.  She is neurovascularly intact distally.  Specialty Comments:  No specialty comments available.  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   Elevated serum creatinine 11/26/2021   Elevated antinuclear antibody (ANA) level 11/26/2021   Seasonal allergic rhinitis due to pollen 05/22/2021   Current non-smoker but past smoking history unknown 05/22/2021   History of hyperparathyroidism 05/22/2021   Mixed hyperlipidemia 05/22/2021   Elevated hemoglobin (HCC) 05/22/2021   Pulmonary emphysema (HCC) 05/22/2021   Stage 3a chronic kidney disease (HCC) 05/22/2021    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/27/2020   Constipation 07/08/2018   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   Essential hypertension, benign 05/16/2014   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   Right thyroid nodule 12/22/2013   History of nephrectomy 08/10/2013   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   Migraines    Stroke (HCC) 09/19/2019   Left side & vision   Thyroid disease    Hx of     Family History  Problem Relation Age of Onset   Hypertension Mother    Heart disease Mother        Irregular heart beat    Arrhythmia Mother    Rheum arthritis Mother    Pulmonary fibrosis Mother    Osteoporosis Mother    Colon cancer Mother    Lung cancer Father    COPD Father    Pulmonary fibrosis Father    Hypertension Sister    Hypertension Sister    Aneurysm Brother 11       brain   Heart disease Maternal Grandmother    Heart disease Maternal Aunt    Pulmonary fibrosis Maternal Aunt    Rheum arthritis Maternal Uncle    Pulmonary fibrosis Maternal Uncle    Diabetes Maternal Uncle    Colon polyps Neg Hx     Past Surgical History:  Procedure Laterality Date   ABDOMINAL HYSTERECTOMY  1991   partial, still with right ovary   BLADDER SURGERY     bladder tac, then bladder tack reversal due to adhesions.   DORSAL COMPARTMENT RELEASE Right 02/07/2013   Procedure: RIGHT WRIST DEQUERVAIN RELEASE;  Surgeon: Jodi Marble, MD;  Location: [facility];  Service: Orthopedics;  Laterality: Right;   KIDNEY DONATION  2008   gave her lt kidney to daughter   NECK MASS EXCISION     rt-mass   NECK SURGERY  1990   parotidectomy, one gland removed   SHOULDER ARTHROSCOPY WITH OPEN ROTATOR CUFF REPAIR AND DISTAL CLAVICLE ACROMINECTOMY Right 04/25/2014   Procedure: SHOULDER ARTHROSCOPY WITH  MINI-OPEN ROTATOR CUFF REPAIR AND DISTAL CLAVICLE RESECTION;  Surgeon: Valeria Batman, MD;  Location: [facility];  Service: Orthopedics;  Laterality: Right;   SHOULDER CLOSED REDUCTION Right 08/31/2014   Procedure: CLOSED MANIPULATION SHOULDER;  Surgeon: Valeria Batman, MD;  Location: [facility];  Service: Orthopedics;  Laterality: Right;   SUBACROMIAL DECOMPRESSION Right 04/25/2014   Procedure: SUBACROMIAL DECOMPRESSION;  Surgeon: Valeria Batman, MD;  Location:  [facility];  Service: Orthopedics;  Laterality: Right;   THYROID SURGERY     bx right thyroid   Social History   Occupational History   Occupation: Disabled    Employer: [employer]    Comment: assembly line  Tobacco Use   Smoking status: Former    Packs/day: 0.25    Years: 54.00    Additional pack years: 0.00    Total pack years: 13.50    Types: Cigarettes, E-cigarettes    Quit date: 10/25/2014    Years since quitting: 7.7   Smokeless tobacco: Never  Vaping Use   Vaping Use: Never used  Substance and Sexual Activity   Alcohol use: No   Drug use: No   Sexual activity: Not Currently

## 2022-07-16 ENCOUNTER — Telehealth: Payer: Self-pay | Admitting: Orthopaedic Surgery

## 2022-07-16 NOTE — Telephone Encounter (Signed)
Patient called advised she is still having pain in her upper left leg. Patient said she is out of the prednisone and the muscle relaxer did not work. Patient asked if she can come in and get the injection.   The number to contact patient is [phone]

## 2022-07-17 ENCOUNTER — Other Ambulatory Visit: Payer: Self-pay | Admitting: Physician Assistant

## 2022-07-17 MED ORDER — PREDNISONE 10 MG (21) PO TBPK: ORAL_TABLET | ORAL | 0 refills | Status: DC

## 2022-07-17 NOTE — Telephone Encounter (Signed)
Called and left detailed message on the phone.

## 2022-07-17 NOTE — Telephone Encounter (Signed)
I refilled prednisone.   I would not recommend troch inj as it did not previously help.  I would get mri lumbar spine and f/u with megan williams for further eval

## 2022-07-18 ENCOUNTER — Ambulatory Visit (INDEPENDENT_AMBULATORY_CARE_PROVIDER_SITE_OTHER): Payer: 59 | Admitting: Family

## 2022-07-18 ENCOUNTER — Encounter: Payer: Self-pay | Admitting: Family

## 2022-07-18 VITALS — BP 116/78 | HR 75 | Temp 97.5°F | Resp 16 | Ht 67.0 in | Wt 132.6 lb

## 2022-07-18 DIAGNOSIS — I10 Essential (primary) hypertension: Secondary | ICD-10-CM

## 2022-07-18 DIAGNOSIS — E162 Hypoglycemia, unspecified: Secondary | ICD-10-CM | POA: Diagnosis not present

## 2022-07-18 DIAGNOSIS — N1831 Chronic kidney disease, stage 3a: Secondary | ICD-10-CM | POA: Diagnosis not present

## 2022-07-18 DIAGNOSIS — T753XXA Motion sickness, initial encounter: Secondary | ICD-10-CM | POA: Diagnosis not present

## 2022-07-18 DIAGNOSIS — J439 Emphysema, unspecified: Secondary | ICD-10-CM | POA: Diagnosis not present

## 2022-07-18 DIAGNOSIS — E538 Deficiency of other specified B group vitamins: Secondary | ICD-10-CM

## 2022-07-18 DIAGNOSIS — Z5320 Procedure and treatment not carried out because of patient's decision for unspecified reasons: Secondary | ICD-10-CM | POA: Insufficient documentation

## 2022-07-18 MED ORDER — SCOPOLAMINE 1 MG/3DAYS TD PT72
1.0000 | MEDICATED_PATCH | TRANSDERMAL | 0 refills | Status: DC
Start: 2022-07-18 — End: 2022-08-27

## 2022-07-18 NOTE — Assessment & Plan Note (Signed)
Controlled without medication  Will continue to monitor

## 2022-07-18 NOTE — Assessment & Plan Note (Signed)
stable °

## 2022-07-18 NOTE — Progress Notes (Signed)
Established Patient Office Visit  Subjective:      CC:  Chief Complaint  Patient presents with   COPD    Pt is going on a cruise and would like something for "sea sick"     HPI: [name] is a 67 y.o. female presenting on 07/18/2022 for COPD (Pt is going on a cruise and would like something for "sea sick" ) . Emphysema: pt denies sob or doe. She only gets sob with bronchitis she has declined lung cancer screening, and decided not to make appt for pulmonary. She states she will consider lung cancer screening when she gets back.   New complaints: Pt is going on a cruise, is worried she will be sea sick.   HTN: currently 116/72 At home average is 120/80 doing well with this. No longer taking amlodipine.  Wt Readings from Last 3 Encounters:  07/18/22 132 lb 9.6 oz (60.1 kg)  05/23/22 134 lb 4 oz (60.9 kg)  04/28/22 137 lb 12.8 oz (62.5 kg)   Temp Readings from Last 3 Encounters:  07/18/22 (!) 97.5 F (36.4 C) (Temporal)  05/23/22 98.1 F (36.7 C) (Temporal)  04/14/22 97.9 F (36.6 C) (Temporal)   BP Readings from Last 3 Encounters:  07/18/22 116/78  05/23/22 92/70  04/28/22 108/70   Pulse Readings from Last 3 Encounters:  07/18/22 75  05/23/22 88  04/28/22 81   Pt does state that she had dips in her blood sugar when she doesn't eat, and she   She does also report has lost about five pounds, has not been trying.  She does see endocrinologist for her thyroid will also assess this with her at next visit.  Wt Readings from Last 3 Encounters:  07/18/22 132 lb 9.6 oz (60.1 kg)  05/23/22 134 lb 4 oz (60.9 kg)  04/28/22 137 lb 12.8 oz (62.5 kg)   Lab Results  Component Value Date   VITAMINB12 393 02/24/2022   Not currently taking otc b12.  Has tried pills but can not tolerate them. Might consider the liquid.       Social history:  Relevant past medical, surgical, family and social history reviewed and updated as indicated. Interim medical history  since our last visit reviewed.  Allergies and medications reviewed and updated.  DATA REVIEWED: CHART IN EPIC     ROS: Negative unless specifically indicated above in HPI.    Current Outpatient Medications:    scopolamine (TRANSDERM-SCOP) 1 MG/3DAYS, Place 1 patch (1.5 mg total) onto the skin every 3 (three) days., Disp: 4 patch, Rfl: 0   acetaminophen (TYLENOL) 325 MG tablet, Take 2 tablets (650 mg total) by mouth every 4 (four) hours as needed for mild pain (or temp > 37.5 C (99.5 F))., Disp: , Rfl:    aspirin EC 81 MG EC tablet, Take 1 tablet (81 mg total) by mouth daily. Swallow whole., Disp: 30 tablet, Rfl: 11   atorvastatin (LIPITOR) 40 MG tablet, Take 40 mg by mouth every other day., Disp: , Rfl:    baclofen (LIORESAL) 10 MG tablet, Take 1 tablet (10 mg total) by mouth 2 (two) times daily as needed for muscle spasms., Disp: 20 each, Rfl: 0   fexofenadine (ALLEGRA) 180 MG tablet, Take 180 mg by mouth daily., Disp: , Rfl:    fluticasone (FLONASE) 50 MCG/ACT nasal spray, Place 1 spray into both nostrils daily., Disp: , Rfl:    methocarbamol (ROBAXIN-750) 750 MG tablet, Take 1 tablet (750 mg total) by mouth  2 (two) times daily as needed for muscle spasms., Disp: 20 tablet, Rfl: 1   valACYclovir (VALTREX) 500 MG tablet, TAKE 1 TABLET BY MOUTH DAILY, Disp: 100 tablet, Rfl: 2      Objective:    BP 116/78 (BP Location: Left Arm, Patient Position: Sitting, Cuff Size: Normal)   Pulse 75   Temp (!) 97.5 F (36.4 C) (Temporal)   Resp 16   Ht 5\' 7"  (1.702 m)   Wt 132 lb 9.6 oz (60.1 kg)   SpO2 98%   BMI 20.77 kg/m   Wt Readings from Last 3 Encounters:  07/18/22 132 lb 9.6 oz (60.1 kg)  05/23/22 134 lb 4 oz (60.9 kg)  04/28/22 137 lb 12.8 oz (62.5 kg)    Physical Exam Constitutional:      General: She is not in acute distress.    Appearance: Normal appearance. She is normal weight. She is not ill-appearing, toxic-appearing or diaphoretic.  HENT:     Head: Normocephalic.   Cardiovascular:     Rate and Rhythm: Normal rate.  Pulmonary:     Effort: Pulmonary effort is normal.  Musculoskeletal:        General: Normal range of motion.  Neurological:     General: No focal deficit present.     Mental Status: She is alert and oriented to person, place, and time. Mental status is at baseline.  Psychiatric:        Mood and Affect: Mood normal.        Behavior: Behavior normal.        Thought Content: Thought content normal.        Judgment: Judgment normal.           Assessment & Plan:  Motion sickness, initial encounter Assessment & Plan: Reviewed cx for scopolamine patch, no known CAD, CKD but no adjustments necessary.  Rx scopalamine for use if needed on cruise.  HTN well controlled.  Orders: -     Scopolamine; Place 1 patch (1.5 mg total) onto the skin every 3 (three) days.  Dispense: 4 patch; Refill: 0  Lung cancer screening declined by patient Assessment & Plan: Discussed with pt , she declines today but will consider upon return from her cruise.    Pulmonary emphysema, unspecified emphysema type (HCC) Assessment & Plan: Stable today  Pt will consider lung cancer screening however declines today    Hypoglycemia  Essential hypertension, benign Assessment & Plan: Controlled without medication Will continue to monitor    Vitamin B 12 deficiency Assessment & Plan: Did advise to restart b12 at 1000 mcg sl once daily as unable to tolerate pill form   Stage 3a chronic kidney disease (HCC) Assessment & Plan: stable      Return in about 6 months (around 01/18/2023) for f/u CPE.  Mort Sawyers, MSN, APRN, FNP-C [facility]

## 2022-07-18 NOTE — Assessment & Plan Note (Signed)
Stable today  Pt will consider lung cancer screening however declines today

## 2022-07-18 NOTE — Assessment & Plan Note (Signed)
Did advise to restart b12 at 1000 mcg sl once daily as unable to tolerate pill form

## 2022-07-18 NOTE — Assessment & Plan Note (Signed)
Discussed with pt , she declines today but will consider upon return from her cruise.

## 2022-07-18 NOTE — Assessment & Plan Note (Signed)
Reviewed cx for scopolamine patch, no known CAD, CKD but no adjustments necessary.  Rx scopalamine for use if needed on cruise.  HTN well controlled.

## 2022-07-28 ENCOUNTER — Other Ambulatory Visit: Payer: Self-pay

## 2022-07-28 ENCOUNTER — Telehealth: Payer: Self-pay | Admitting: Physician Assistant

## 2022-07-28 DIAGNOSIS — M79605 Pain in left leg: Secondary | ICD-10-CM

## 2022-07-28 NOTE — Telephone Encounter (Signed)
Patient called. She would like a MRI done. Says [name] talked about it at the last visit. Her cb# [phone]

## 2022-07-28 NOTE — Telephone Encounter (Signed)
Ok for MRI lumbar spine as long as insurance will approve.  May need 6 weeks of pt/guided home exercise prior.  If she gets mri, lets have her f/u with megan after

## 2022-07-28 NOTE — Telephone Encounter (Signed)
Notified patient.

## 2022-08-25 ENCOUNTER — Ambulatory Visit
Admission: RE | Admit: 2022-08-25 | Discharge: 2022-08-25 | Disposition: A | Discharge status: Home / Self Care | Payer: 59 | Source: Ambulatory Visit | Attending: Physician Assistant | Admitting: Physician Assistant

## 2022-08-25 DIAGNOSIS — M47816 Spondylosis without myelopathy or radiculopathy, lumbar region: Secondary | ICD-10-CM | POA: Diagnosis not present

## 2022-08-25 DIAGNOSIS — M79605 Pain in left leg: Secondary | ICD-10-CM

## 2022-08-26 ENCOUNTER — Telehealth: Payer: Self-pay | Admitting: Family

## 2022-08-26 NOTE — Telephone Encounter (Signed)
FYI: This call has been transferred to Access Nurse. Once the result note has been entered staff can address the message at that time.  Patient called in with the following symptoms:  Red Word:dizziness    Please advise at [facility] [phone]  Message is routed to Provider Pool and [facility] Triage

## 2022-08-26 NOTE — Telephone Encounter (Signed)
Noted  

## 2022-08-26 NOTE — Telephone Encounter (Signed)
Judeth Cornfield RN with access nurse called for appt within 24 hrs for pt to be seen for dizziness.; Judeth Cornfield scheduled pt an appt for 08/27/22 at 11 AM with Dr Ermalene Searing and Judeth Cornfield will give pt UC & ED precautions also. Sending note to Dr Ermalene Searing and [place] pool.

## 2022-08-27 ENCOUNTER — Ambulatory Visit (INDEPENDENT_AMBULATORY_CARE_PROVIDER_SITE_OTHER): Payer: 59 | Admitting: Family Medicine

## 2022-08-27 VITALS — BP 120/78 | HR 106 | Temp 97.0°F | Ht 67.0 in | Wt 131.0 lb

## 2022-08-27 DIAGNOSIS — R55 Syncope and collapse: Secondary | ICD-10-CM

## 2022-08-27 LAB — CBC WITH DIFFERENTIAL/PLATELET
Basophils Absolute: 0 10*3/uL (ref 0.0–0.1)
Basophils Relative: 0.5 % (ref 0.0–3.0)
Eosinophils Absolute: 0.1 10*3/uL (ref 0.0–0.7)
Eosinophils Relative: 2 % (ref 0.0–5.0)
HCT: 44.3 % (ref 36.0–46.0)
Hemoglobin: 14.9 g/dL (ref 12.0–15.0)
Lymphocytes Relative: 20.3 % (ref 12.0–46.0)
Lymphs Abs: 1.1 10*3/uL (ref 0.7–4.0)
MCHC: 33.6 g/dL (ref 30.0–36.0)
MCV: 88.2 fl (ref 78.0–100.0)
Monocytes Absolute: 0.4 10*3/uL (ref 0.1–1.0)
Monocytes Relative: 7.3 % (ref 3.0–12.0)
Neutro Abs: 3.8 10*3/uL (ref 1.4–7.7)
Neutrophils Relative %: 69.9 % (ref 43.0–77.0)
Platelets: 226 10*3/uL (ref 150.0–400.0)
RBC: 5.03 Mil/uL (ref 3.87–5.11)
RDW: 12.7 % (ref 11.5–15.5)
WBC: 5.5 10*3/uL (ref 4.0–10.5)

## 2022-08-27 LAB — COMPREHENSIVE METABOLIC PANEL
ALT: 10 U/L (ref 0–35)
AST: 18 U/L (ref 0–37)
Albumin: 4.1 g/dL (ref 3.5–5.2)
Alkaline Phosphatase: 93 U/L (ref 39–117)
BUN: 15 mg/dL (ref 6–23)
CO2: 34 mEq/L — ABNORMAL HIGH (ref 19–32)
Calcium: 9.8 mg/dL (ref 8.4–10.5)
Chloride: 104 meq/L (ref 96–112)
Creatinine, Ser: 1.16 mg/dL (ref 0.40–1.20)
GFR: 48.61 mL/min — ABNORMAL LOW (ref >60.00)
Glucose, Bld: 100 mg/dL — ABNORMAL HIGH (ref 70–99)
Potassium: 4.3 meq/L (ref 3.5–5.1)
Sodium: 143 meq/L (ref 135–145)
Total Bilirubin: 0.6 mg/dL (ref 0.2–1.2)
Total Protein: 6.7 g/dL (ref 6.0–8.3)

## 2022-08-27 LAB — HEMOGLOBIN A1C: Hgb A1c MFr Bld: 5.2 % (ref 4.6–6.5)

## 2022-08-27 NOTE — Progress Notes (Signed)
Patient ID: [name], female    DOB: 1955/12/02, 67 y.o.   MRN: [id number]  This visit was conducted in person.  BP 120/78 (BP Location: Left Arm, Patient Position: Sitting, Cuff Size: Normal)   Pulse (!) 106   Temp (!) 97 F (36.1 C) (Temporal)   Ht 5\' 7"  (1.702 m)   Wt 131 lb (59.4 kg)   SpO2 97%   BMI 20.52 kg/m    CC:  Chief Complaint  Patient presents with   Dizziness    Patient states this has happened 3 times now. She was given a candy bar or snack and get back to normal these times. Patient stated that she got shaky when this occurred as well.     Subjective:   HPI: [name] is a 67 y.o. female patient of [name] Sawyers, FNP presenting on 08/27/2022 for Dizziness (Patient states this has happened 3 times now. She was given a candy bar or snack and get back to normal these times. Patient stated that she got shaky when this occurred as well. )   She reports she  has had 2 episodes earlier in the last year  More recently episode on cruise ship and again in last weekend.  Typically happens if she skips eating,  this time happened 1-2 hours after eating french toast and sausage, creamer and coffee.  Feels pre-sycopal. No headache. No LOC.  No palpitations, no SOB, no sweating, no CP.  Feels better if eat cady and juice.   History of  CVA(chronic left upper extremity weakness and left facial droop), hypertension, pulmonary emphysema, chronic kidney disease, vitamin B12 deficiency hypoglycemia.      Good appetite..  fruits and veggies. Wt Readings from Last 3 Encounters:  08/27/22 131 lb (59.4 kg)  07/18/22 132 lb 9.6 oz (60.1 kg)  05/23/22 134 lb 4 oz (60.9 kg)    BP Readings from Last 3 Encounters:  08/27/22 120/78  07/18/22 116/78  05/23/22 92/70    Relevant past medical, surgical, family and social history reviewed and updated as indicated. Interim medical history since our last visit reviewed. Allergies and medications reviewed and  updated. Outpatient Medications Prior to Visit  Medication Sig Dispense Refill   aspirin EC 81 MG EC tablet Take 1 tablet (81 mg total) by mouth daily. Swallow whole. 30 tablet 11   valACYclovir (VALTREX) 500 MG tablet TAKE 1 TABLET BY MOUTH DAILY (Patient taking differently: Take 500 mg by mouth as needed.) 100 tablet 2   acetaminophen (TYLENOL) 325 MG tablet Take 2 tablets (650 mg total) by mouth every 4 (four) hours as needed for mild pain (or temp > 37.5 C (99.5 F)).     atorvastatin (LIPITOR) 40 MG tablet Take 40 mg by mouth every other day.     baclofen (LIORESAL) 10 MG tablet Take 1 tablet (10 mg total) by mouth 2 (two) times daily as needed for muscle spasms. 20 each 0   fexofenadine (ALLEGRA) 180 MG tablet Take 180 mg by mouth daily.     fluticasone (FLONASE) 50 MCG/ACT nasal spray Place 1 spray into both nostrils daily.     methocarbamol (ROBAXIN-750) 750 MG tablet Take 1 tablet (750 mg total) by mouth 2 (two) times daily as needed for muscle spasms. 20 tablet 1   scopolamine (TRANSDERM-SCOP) 1 MG/3DAYS Place 1 patch (1.5 mg total) onto the skin every 3 (three) days. 4 patch 0   No facility-administered medications prior to visit.  Per HPI unless specifically indicated in ROS section below Review of Systems  Constitutional:  Negative for fatigue and fever.  HENT:  Negative for congestion.   Eyes:  Negative for pain.  Respiratory:  Negative for cough and shortness of breath.   Cardiovascular:  Negative for chest pain, palpitations and leg swelling.  Gastrointestinal:  Negative for abdominal pain.  Genitourinary:  Negative for dysuria and vaginal bleeding.  Musculoskeletal:  Negative for back pain.  Neurological:  Negative for syncope, light-headedness and headaches.  Psychiatric/Behavioral:  Negative for dysphoric mood.    Objective:  BP 120/78 (BP Location: Left Arm, Patient Position: Sitting, Cuff Size: Normal)   Pulse (!) 106   Temp (!) 97 F (36.1 C) (Temporal)   Ht  5\' 7"  (1.702 m)   Wt 131 lb (59.4 kg)   SpO2 97%   BMI 20.52 kg/m   Wt Readings from Last 3 Encounters:  08/27/22 131 lb (59.4 kg)  07/18/22 132 lb 9.6 oz (60.1 kg)  05/23/22 134 lb 4 oz (60.9 kg)      Physical Exam Constitutional:      General: She is not in acute distress.    Appearance: Normal appearance. She is well-developed. She is not ill-appearing or toxic-appearing.  HENT:     Head: Normocephalic.     Right Ear: Hearing, tympanic membrane, ear canal and external ear normal. Tympanic membrane is not erythematous, retracted or bulging.     Left Ear: Hearing, tympanic membrane, ear canal and external ear normal. Tympanic membrane is not erythematous, retracted or bulging.     Nose: No mucosal edema or rhinorrhea.     Right Sinus: No maxillary sinus tenderness or frontal sinus tenderness.     Left Sinus: No maxillary sinus tenderness or frontal sinus tenderness.     Mouth/Throat:     Mouth: Oropharynx is clear and moist and mucous membranes are normal.     Pharynx: Uvula midline.  Eyes:     General: Lids are normal. Lids are everted, no foreign bodies appreciated.     Extraocular Movements: EOM normal.     Conjunctiva/sclera: Conjunctivae normal.     Pupils: Pupils are equal, round, and reactive to light.  Neck:     Thyroid: No thyroid mass or thyromegaly.     Vascular: No carotid bruit.     Trachea: Trachea normal.  Cardiovascular:     Rate and Rhythm: Normal rate and regular rhythm.     Pulses: Normal pulses.     Heart sounds: Normal heart sounds, S1 normal and S2 normal. No murmur heard.    No friction rub. No gallop.  Pulmonary:     Effort: Pulmonary effort is normal. No tachypnea or respiratory distress.     Breath sounds: Normal breath sounds. No decreased breath sounds, wheezing, rhonchi or rales.  Abdominal:     General: Bowel sounds are normal.     Palpations: Abdomen is soft.     Tenderness: There is no abdominal tenderness.  Musculoskeletal:     Cervical  back: Normal range of motion and neck supple.  Skin:    General: Skin is warm, dry and intact.     Findings: No rash.  Neurological:     Mental Status: She is alert and oriented to person, place, and time.     Cranial Nerves: Cranial nerve deficit and facial asymmetry present.     Sensory: Sensation is intact.     Motor: Weakness present.     Comments: Stable  past stroke symptoms, no new neurologic changes.  Psychiatric:        Mood and Affect: Mood is not anxious or depressed.        Speech: Speech normal.        Behavior: Behavior normal. Behavior is cooperative.        Thought Content: Thought content normal.        Cognition and Memory: Cognition and memory normal.        Judgment: Judgment normal.       Results for orders placed or performed in visit on 04/11/22  POC COVID-19 BinaxNow  Result Value Ref Range   SARS Coronavirus 2 Ag Negative Negative  Rapid Strep A  Result Value Ref Range   Rapid Strep A Screen Negative Negative    EKG: normal EKG, normal sinus rhythm, unchanged from previous tracings.   Assessment and Plan  Pre-syncope Assessment & Plan: Chronic recurrent with now increasing frequency every day. No loss of consciousness. No red flags or clear indication for imaging.  Patient followed by neurology for past stroke. Will evaluate with EKG and lab evaluation.  Previously symptoms were related to skipping eating and likely hypoglycemia but most recently she had symptoms within an hour or 2 after eating a small meal.  Offered glucometer but she does not want to check her blood sugars at this time.   Orders: -     CBC with Differential/Platelet -     Hemoglobin A1c -     Comprehensive metabolic panel -     Vitamin B12 -     TSH -     EKG 12-Lead    No follow-ups on file.   Kerby Nora, MD

## 2022-08-27 NOTE — Assessment & Plan Note (Signed)
Chronic recurrent with now increasing frequency every day. No loss of consciousness. No red flags or clear indication for imaging.  Patient followed by neurology for past stroke. Will evaluate with EKG and lab evaluation.  Previously symptoms were related to skipping eating and likely hypoglycemia but most recently she had symptoms within an hour or 2 after eating a small meal.  Offered glucometer but she does not want to check her blood sugars at this time.

## 2022-08-27 NOTE — Patient Instructions (Signed)
Make sure you do not go more than 5 hours without eating or having a snack.  Make sure each meal has protein and fiber.  Keep up with adequate water intake daily.  Please stop at the lab to have labs drawn.

## 2022-08-29 LAB — VITAMIN B12: Vitamin B-12: 331 pg/mL (ref 211–911)

## 2022-08-29 LAB — TSH: TSH: 1.67 u[IU]/mL (ref 0.35–5.50)

## 2022-09-10 NOTE — Progress Notes (Signed)
F/u to go over mri and to reevaluate with xu.  Thinking this could be from stroke

## 2022-09-11 ENCOUNTER — Encounter: Payer: Self-pay | Admitting: Orthopaedic Surgery

## 2022-09-11 ENCOUNTER — Ambulatory Visit (INDEPENDENT_AMBULATORY_CARE_PROVIDER_SITE_OTHER): Payer: 59 | Admitting: Orthopaedic Surgery

## 2022-09-11 DIAGNOSIS — M79605 Pain in left leg: Secondary | ICD-10-CM

## 2022-09-11 NOTE — Progress Notes (Signed)
Office Visit Note   Patient: [name]           Date of Birth: 1955-01-16           MRN: [id number] Visit Date: 09/11/2022              Requested by: Mort Sawyers, FNP [address] [place],  [place] [address] PCP: Mort Sawyers, FNP   Assessment & Plan: Visit Diagnoses:  1. Left leg pain     Plan: MRI of the lumbar spine is unrevealing as a relates to her symptoms.  Again our impression is that her symptoms are due to a stroke.  From my standpoint she can follow-up with [place] as needed.  She declined physical therapy.  Follow-Up Instructions: No follow-ups on file.   Orders:  No orders of the defined types were placed in this encounter.  No orders of the defined types were placed in this encounter.     Procedures: No procedures performed   Clinical Data: No additional findings.   Subjective: Chief Complaint  Patient presents with   Lower Back - Follow-up    MRI review    HPI [name] returns today for MRI review. Review of Systems   Objective: Vital Signs: There were no vitals taken for this visit.  Physical Exam  Ortho Exam Exam is unchanged. Specialty Comments:  No specialty comments available.  Imaging: No results found.   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   Elevated serum creatinine 11/26/2021   Elevated antinuclear antibody (ANA) level 11/26/2021   Seasonal allergic rhinitis due to pollen 05/22/2021   Current non-smoker but past smoking history unknown 05/22/2021   History of hyperparathyroidism 05/22/2021    Mixed hyperlipidemia 05/22/2021   Elevated hemoglobin (HCC) 05/22/2021   Pulmonary emphysema (HCC) 05/22/2021   Stage 3a chronic kidney disease (HCC) 05/22/2021   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/27/2020   Constipation 07/08/2018   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   Essential hypertension, benign 05/16/2014   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   Right thyroid nodule 12/22/2013   History of nephrectomy 08/10/2013   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   Migraines    Stroke (HCC) 09/19/2019   Left side & vision   Thyroid disease    Hx of     Family History  Problem Relation Age of Onset   Hypertension Mother    Heart disease Mother        Irregular heart beat    Arrhythmia Mother    Rheum arthritis Mother    Pulmonary fibrosis Mother    Osteoporosis Mother    Colon cancer Mother    Lung cancer Father    COPD Father    Pulmonary fibrosis Father    Hypertension Sister    Hypertension Sister    Aneurysm Brother 81  brain   Heart disease Maternal Grandmother    Heart disease Maternal Aunt    Pulmonary fibrosis Maternal Aunt    Rheum arthritis Maternal Uncle    Pulmonary fibrosis Maternal Uncle    Diabetes Maternal Uncle    Colon polyps Neg Hx     Past Surgical History:  Procedure Laterality Date   ABDOMINAL HYSTERECTOMY  1991   partial, still with right ovary   BLADDER SURGERY     bladder tac, then bladder tack reversal due to adhesions.   DORSAL COMPARTMENT RELEASE Right 02/07/2013   Procedure: RIGHT WRIST DEQUERVAIN RELEASE;  Surgeon: Jodi Marble, MD;  Location: [facility];  Service: Orthopedics;  Laterality: Right;   KIDNEY DONATION  2008   gave her lt kidney to daughter   NECK MASS EXCISION     rt-mass   NECK SURGERY  1990   parotidectomy, one  gland removed   SHOULDER ARTHROSCOPY WITH OPEN ROTATOR CUFF REPAIR AND DISTAL CLAVICLE ACROMINECTOMY Right 04/25/2014   Procedure: SHOULDER ARTHROSCOPY WITH MINI-OPEN ROTATOR CUFF REPAIR AND DISTAL CLAVICLE RESECTION;  Surgeon: Valeria Batman, MD;  Location: [facility];  Service: Orthopedics;  Laterality: Right;   SHOULDER CLOSED REDUCTION Right 08/31/2014   Procedure: CLOSED MANIPULATION SHOULDER;  Surgeon: Valeria Batman, MD;  Location: [facility];  Service: Orthopedics;  Laterality: Right;   SUBACROMIAL DECOMPRESSION Right 04/25/2014   Procedure: SUBACROMIAL DECOMPRESSION;  Surgeon: Valeria Batman, MD;  Location: [facility];  Service: Orthopedics;  Laterality: Right;   THYROID SURGERY     bx right thyroid   Social History   Occupational History   Occupation: Disabled    Employer: [employer]    Comment: assembly line  Tobacco Use   Smoking status: Former    Current packs/day: 0.00    Average packs/day: 0.3 packs/day for 54.0 years (13.5 ttl pk-yrs)    Types: Cigarettes, E-cigarettes    Start date: 10/24/1960    Quit date: 10/25/2014    Years since quitting: 7.8   Smokeless tobacco: Never  Vaping Use   Vaping status: Never Used  Substance and Sexual Activity   Alcohol use: No   Drug use: No   Sexual activity: Not Currently

## 2022-09-18 ENCOUNTER — Telehealth: Payer: Self-pay | Admitting: Family

## 2022-09-18 DIAGNOSIS — Z905 Acquired absence of kidney: Secondary | ICD-10-CM

## 2022-09-18 DIAGNOSIS — N1831 Chronic kidney disease, stage 3a: Secondary | ICD-10-CM

## 2022-09-18 NOTE — Telephone Encounter (Signed)
Patient called in and stated that she need a new referral to be sent to Dr. Allena Katz at [facility] in [place]. She stated that the last doctor she had retired. Thank you!

## 2022-09-19 NOTE — Addendum Note (Signed)
Addended by: Mort Sawyers on: 09/19/2022 11:12 AM   Modules accepted: Orders

## 2022-09-23 ENCOUNTER — Ambulatory Visit: Payer: 59 | Admitting: Primary Care

## 2022-10-06 ENCOUNTER — Telehealth: Payer: Self-pay | Admitting: Family

## 2022-10-06 NOTE — Telephone Encounter (Signed)
FYI: This call has been transferred to Access Nurse. Once the result note has been entered staff can address the message at that time.  Patient called in with the following symptoms:  Red Word:abdominal pain Patient called in and stated that she is experiencing some abdominal pain and nauseous. She stating that when standing its at a 10 and sitting down 0.   Please advise at Mobile [phone] (mobile)  Message is routed to Provider Pool and [facility] Triage

## 2022-10-06 NOTE — Telephone Encounter (Signed)
Per appt notes pt has appt already scheduled with Dr Para March on 10/07/22 at 8:30. Sending note to Dr Para March and Para March pool.

## 2022-10-06 NOTE — Telephone Encounter (Signed)
Will see at OV.  Thanks.  

## 2022-10-07 ENCOUNTER — Ambulatory Visit: Payer: 59 | Admitting: Family Medicine

## 2022-10-10 ENCOUNTER — Other Ambulatory Visit: Payer: Self-pay | Admitting: Family

## 2022-10-10 DIAGNOSIS — Z1211 Encounter for screening for malignant neoplasm of colon: Secondary | ICD-10-CM

## 2022-10-10 DIAGNOSIS — Z1212 Encounter for screening for malignant neoplasm of rectum: Secondary | ICD-10-CM

## 2022-10-14 ENCOUNTER — Ambulatory Visit (INDEPENDENT_AMBULATORY_CARE_PROVIDER_SITE_OTHER): Payer: 59 | Admitting: Nurse Practitioner

## 2022-10-14 ENCOUNTER — Telehealth: Payer: Self-pay | Admitting: Nurse Practitioner

## 2022-10-14 ENCOUNTER — Encounter: Payer: Self-pay | Admitting: Nurse Practitioner

## 2022-10-14 VITALS — BP 110/78 | HR 105 | Temp 97.7°F | Ht 67.0 in | Wt 128.0 lb

## 2022-10-14 DIAGNOSIS — R310 Gross hematuria: Secondary | ICD-10-CM | POA: Insufficient documentation

## 2022-10-14 DIAGNOSIS — K625 Hemorrhage of anus and rectum: Secondary | ICD-10-CM | POA: Insufficient documentation

## 2022-10-14 DIAGNOSIS — N3090 Cystitis, unspecified without hematuria: Secondary | ICD-10-CM | POA: Insufficient documentation

## 2022-10-14 DIAGNOSIS — G43C0 Periodic headache syndromes in child or adult, not intractable: Secondary | ICD-10-CM

## 2022-10-14 DIAGNOSIS — N3001 Acute cystitis with hematuria: Secondary | ICD-10-CM | POA: Insufficient documentation

## 2022-10-14 LAB — COMPREHENSIVE METABOLIC PANEL
ALT: 11 U/L (ref 0–35)
AST: 19 U/L (ref 0–37)
Albumin: 4.2 g/dL (ref 3.5–5.2)
Alkaline Phosphatase: 87 U/L (ref 39–117)
BUN: 20 mg/dL (ref 6–23)
CO2: 31 meq/L (ref 19–32)
Calcium: 10.1 mg/dL (ref 8.4–10.5)
Chloride: 105 meq/L (ref 96–112)
Creatinine, Ser: 1.21 mg/dL — ABNORMAL HIGH (ref 0.40–1.20)
GFR: 46.16 mL/min — ABNORMAL LOW (ref >60.00)
Glucose, Bld: 113 mg/dL — ABNORMAL HIGH (ref 70–99)
Potassium: 4.2 meq/L (ref 3.5–5.1)
Sodium: 142 meq/L (ref 135–145)
Total Bilirubin: 0.5 mg/dL (ref 0.2–1.2)
Total Protein: 6.5 g/dL (ref 6.0–8.3)

## 2022-10-14 LAB — CBC
HCT: 44.9 % (ref 36.0–46.0)
Hemoglobin: 15 g/dL (ref 12.0–15.0)
MCHC: 33.4 g/dL (ref 30.0–36.0)
MCV: 89.1 fL (ref 78.0–100.0)
Platelets: 247 10*3/uL (ref 150.0–400.0)
RBC: 5.05 Mil/uL (ref 3.87–5.11)
RDW: 12.7 % (ref 11.5–15.5)
WBC: 5.8 10*3/uL (ref 4.0–10.5)

## 2022-10-14 LAB — POCT URINALYSIS DIPSTICK
Bilirubin, UA: POSITIVE
Blood, UA: POSITIVE
Glucose, UA: POSITIVE — AB
Ketones, UA: POSITIVE
Nitrite, UA: POSITIVE
Protein, UA: POSITIVE — AB
Spec Grav, UA: >=1.03 — AB (ref 1.010–1.025)
Urobilinogen, UA: 1 U/dL
pH, UA: 5 (ref 5.0–8.0)

## 2022-10-14 MED ORDER — CEPHALEXIN 500 MG PO CAPS
500.0000 mg | ORAL_CAPSULE | Freq: Two times a day (BID) | ORAL | 0 refills | Status: DC
Start: 2022-10-14 — End: 2022-10-31

## 2022-10-14 MED ORDER — SUMATRIPTAN SUCCINATE 25 MG PO TABS
ORAL_TABLET | ORAL | 0 refills | Status: DC
Start: 2022-10-14 — End: 2023-08-24

## 2022-10-14 NOTE — Telephone Encounter (Signed)
Patient mentioned that she would like a refill on imitrex. Do not see it on the medication list but she states that she takes it for her headaches. Informed her I would send you a message

## 2022-10-14 NOTE — Addendum Note (Signed)
Addended by: Mort Sawyers on: 10/14/2022 04:10 PM   Modules accepted: Orders

## 2022-10-14 NOTE — Telephone Encounter (Signed)
Spoke with pt. States that she takes Imitrex very infrequently. Her last refill was in 2021 and states that it "has taken me this long to go through all of the medication." Reports that she just lost her mother so she is having more headaches than usual.

## 2022-10-14 NOTE — Telephone Encounter (Signed)
I see that imitrex was last prescribed in 2021. Has she even taken it since then? I also do not have a recollection of her last headache as I do not have this on her chart, I do not believe we have discussed them. How oftne does she take the imitrex what are the frequency of the headaches?

## 2022-10-14 NOTE — Assessment & Plan Note (Signed)
Having bright red blood in the toilet water.  Likely secondary to hard stools or constipation.  Patient has a appointment with gastroenterology on 10/16/2022 through [facility].  Patient deferred rectal exam today will check CBC to make sure patient has a stable blood count and electrolytes.  Follow-up with GI as recommended and scheduled

## 2022-10-14 NOTE — Assessment & Plan Note (Signed)
Pending urine culture.  Patient states she does not tolerate medications very well inclusive of Macrobid.  Keflex 500 mg twice daily for 7 days prescribed

## 2022-10-14 NOTE — Assessment & Plan Note (Signed)
UA in office.  Patient has no history of kidney stones

## 2022-10-14 NOTE — Progress Notes (Signed)
Acute Office Visit  Subjective:     Patient ID: COREY ZULEGER, female    DOB: [birth year], 67 y.o.   MRN: [id number]  Chief Complaint  Patient presents with   Urinary Tract Infection    Pt complains of blood in urine and blood in stool. Pt states no burning, urine frequency, or pain while urinating. Pain in lower abdomen when eating. No lower back pain.      Patient is in today for blood in urine and stool with a history of anxiety, CKD, CVA, thyroid disease, hysterectomy with right ovary intact. Of noted her mother had colon cancer    Blood in urine: states that when she uirnate she in toliet and when you wipe.  States that it got worse yesterday she is peeing blood. States the   [facility] in stool : states that she has been having blood in the water for the past 2 days. Statea that she has an appointment with Dr. Ewing Schlein on thursady (GI). BM daily but not one today. State s that she has to sit on the toliet for and sometime she will take seena Review of Systems  Constitutional:  Negative for chills and fever.  Respiratory:  Negative for shortness of breath.   Cardiovascular:  Negative for chest pain.  Gastrointestinal:  Positive for abdominal pain, constipation and melena. Negative for diarrhea, nausea and vomiting.  Genitourinary:  Positive for hematuria. Negative for dysuria, flank pain, frequency and urgency.  Neurological:  Positive for headaches. Negative for dizziness.        Objective:    BP 110/78   Pulse (!) 105   Temp 97.7 F (36.5 C) (Oral)   Ht 5\' 7"  (1.702 m)   Wt 128 lb (58.1 kg)   SpO2 96%   BMI 20.05 kg/m    Physical Exam Vitals and nursing note reviewed.  Constitutional:      Appearance: Normal appearance.  Cardiovascular:     Rate and Rhythm: Normal rate and regular rhythm.     Heart sounds: Normal heart sounds.  Pulmonary:     Effort: Pulmonary effort is normal.     Breath sounds: Normal breath sounds.  Abdominal:     General: Bowel  sounds are normal. There is no distension.     Palpations: There is no mass.     Tenderness: There is no abdominal tenderness. There is no right CVA tenderness or left CVA tenderness.     Hernia: No hernia is present.  Genitourinary:    Comments: Deferred by patient  Neurological:     Mental Status: She is alert.     Results for orders placed or performed in visit on 10/14/22  POCT urinalysis dipstick  Result Value Ref Range   Color, UA dark red    Clarity, UA cloudy    Glucose, UA Positive (A) Negative   Bilirubin, UA pos    Ketones, UA pos    Spec Grav, UA >=1.030 (A) 1.010 - 1.025   Blood, UA pos    pH, UA 5.0 5.0 - 8.0   Protein, UA Positive (A) Negative   Urobilinogen, UA 1.0 0.2 or 1.0 E.U./dL   Nitrite, UA pos    Leukocytes, UA Large (3+) (A) Negative   Appearance     Odor          Assessment & Plan:   Problem List Items Addressed This Visit       Digestive   BRBPR (bright red blood per rectum)  Having bright red blood in the toilet water.  Likely secondary to hard stools or constipation.  Patient has a appointment with gastroenterology on 10/16/2022 through [facility].  Patient deferred rectal exam today will check CBC to make sure patient has a stable blood count and electrolytes.  Follow-up with GI as recommended and scheduled      Relevant Orders   CBC   Comprehensive metabolic panel     Genitourinary   Cystitis    Pending urine culture.  Patient states she does not tolerate medications very well inclusive of Macrobid.  Keflex 500 mg twice daily for 7 days prescribed      Relevant Medications   cephALEXin (KEFLEX) 500 MG capsule   Other Relevant Orders   Urine Culture   Gross hematuria - Primary    UA in office.  Patient has no history of kidney stones      Relevant Orders   POCT urinalysis dipstick (Completed)   CBC   Comprehensive metabolic panel    Meds ordered this encounter  Medications   cephALEXin (KEFLEX) 500 MG capsule    Sig:  Take 1 capsule (500 mg total) by mouth 2 (two) times daily.    Dispense:  14 capsule    Refill:  0    Order Specific Question:   Supervising Provider    Answer:   TOWER, MARNE A [[id number]    Return if symptoms worsen or fail to improve.  Audria Nine, NP

## 2022-10-14 NOTE — Patient Instructions (Signed)
Nice to see you today I have sent in an antibiotic to the pharmacy  Follow up with Dr. Ewing Schlein on Thursday as scheduled I will be in touch with the labs once I have reviewed them

## 2022-10-15 LAB — URINE CULTURE
MICRO NUMBER:: 15567034
SPECIMEN QUALITY:: ADEQUATE

## 2022-10-16 ENCOUNTER — Telehealth: Payer: Self-pay | Admitting: Family

## 2022-10-16 DIAGNOSIS — K921 Melena: Secondary | ICD-10-CM | POA: Diagnosis not present

## 2022-10-16 NOTE — Telephone Encounter (Signed)
Pt called stating she has been having extreme diarrhea since taking cephALEXin (KEFLEX) 500 MG capsule. Pt states sometimes, she's barely able to make it to the bathroom. Pt asked can another rx be prescribed? Pt mentioned she visited her GI provider on today, 10/10, for her blood in urine & she's going to f/u with him again on 10/15. Pt states her GI states she might have to see a urologist. Pt also asked for nurse to review results over with her again, she has questions/concerns. Please advise. Call back # [phone]

## 2022-10-17 NOTE — Telephone Encounter (Signed)
Called patient she d/c the antibiotic wed

## 2022-10-20 NOTE — Telephone Encounter (Signed)
Urine was negative for UTI with culture so no need to take additional antibiotic at this time. Has diarrhea subsided since stopping?

## 2022-10-20 NOTE — Telephone Encounter (Signed)
Spoke with pt. She is aware of Tabitha's response. Reports that diarrhea has subsided since stopping antibiotic.

## 2022-10-20 NOTE — Telephone Encounter (Signed)
Noted  

## 2022-10-21 ENCOUNTER — Telehealth: Payer: Self-pay | Admitting: Nurse Practitioner

## 2022-10-21 DIAGNOSIS — K921 Melena: Secondary | ICD-10-CM | POA: Diagnosis not present

## 2022-10-21 DIAGNOSIS — K449 Diaphragmatic hernia without obstruction or gangrene: Secondary | ICD-10-CM | POA: Diagnosis not present

## 2022-10-21 DIAGNOSIS — K922 Gastrointestinal hemorrhage, unspecified: Secondary | ICD-10-CM | POA: Diagnosis not present

## 2022-10-21 DIAGNOSIS — K3189 Other diseases of stomach and duodenum: Secondary | ICD-10-CM | POA: Diagnosis not present

## 2022-10-21 DIAGNOSIS — K319 Disease of stomach and duodenum, unspecified: Secondary | ICD-10-CM | POA: Diagnosis not present

## 2022-10-21 NOTE — Telephone Encounter (Signed)
Noted. Follow pup in office if symptoms return or the diarrhea does not resolve

## 2022-10-21 NOTE — Telephone Encounter (Signed)
-----   Message from [facility] Beattie T sent at 10/21/2022  3:45 PM EDT -----  ----- Message ----- From: Eden Emms, NP Sent: 10/16/2022   7:54 AM EDT To: Birdena Jubilee  Notified via My Chart   See below

## 2022-10-22 ENCOUNTER — Telehealth: Payer: Self-pay | Admitting: Family

## 2022-10-22 NOTE — Telephone Encounter (Signed)
Pt called in and stated that she was returning a call from [facility]  and would like a call back if possible

## 2022-10-22 NOTE — Telephone Encounter (Signed)
Left message to return call to our office.

## 2022-10-27 NOTE — Telephone Encounter (Signed)
Called patient states she was not feeling well yesterday but doing much better today. She will call office if she has any new symptoms.

## 2022-10-27 NOTE — Telephone Encounter (Signed)
See other encounter.

## 2022-10-28 ENCOUNTER — Ambulatory Visit: Payer: 59 | Admitting: Nurse Practitioner

## 2022-10-30 NOTE — Progress Notes (Signed)
noted 

## 2022-10-31 ENCOUNTER — Ambulatory Visit (INDEPENDENT_AMBULATORY_CARE_PROVIDER_SITE_OTHER): Payer: 59 | Admitting: Family Medicine

## 2022-10-31 ENCOUNTER — Encounter: Payer: Self-pay | Admitting: Family Medicine

## 2022-10-31 VITALS — BP 120/72 | HR 83 | Temp 98.1°F | Ht 67.0 in | Wt 127.1 lb

## 2022-10-31 DIAGNOSIS — R112 Nausea with vomiting, unspecified: Secondary | ICD-10-CM | POA: Insufficient documentation

## 2022-10-31 DIAGNOSIS — R110 Nausea: Secondary | ICD-10-CM | POA: Insufficient documentation

## 2022-10-31 DIAGNOSIS — K3189 Other diseases of stomach and duodenum: Secondary | ICD-10-CM | POA: Diagnosis not present

## 2022-10-31 DIAGNOSIS — R1013 Epigastric pain: Secondary | ICD-10-CM | POA: Insufficient documentation

## 2022-10-31 LAB — CBC WITH DIFFERENTIAL/PLATELET
Basophils Absolute: 0 10*3/uL (ref 0.0–0.1)
Basophils Relative: 0.6 % (ref 0.0–3.0)
Eosinophils Absolute: 0.1 10*3/uL (ref 0.0–0.7)
Eosinophils Relative: 1.9 % (ref 0.0–5.0)
HCT: 43.8 % (ref 36.0–46.0)
Hemoglobin: 14.6 g/dL (ref 12.0–15.0)
Lymphocytes Relative: 23.1 % (ref 12.0–46.0)
Lymphs Abs: 1.4 10*3/uL (ref 0.7–4.0)
MCHC: 33.5 g/dL (ref 30.0–36.0)
MCV: 86.9 fL (ref 78.0–100.0)
Monocytes Absolute: 0.4 10*3/uL (ref 0.1–1.0)
Monocytes Relative: 6.9 % (ref 3.0–12.0)
Neutro Abs: 4.1 10*3/uL (ref 1.4–7.7)
Neutrophils Relative %: 67.5 % (ref 43.0–77.0)
Platelets: 238 10*3/uL (ref 150.0–400.0)
RBC: 5.04 Mil/uL (ref 3.87–5.11)
RDW: 12.9 % (ref 11.5–15.5)
WBC: 6.1 10*3/uL (ref 4.0–10.5)

## 2022-10-31 LAB — LIPASE: Lipase: 38 U/L (ref 11.0–59.0)

## 2022-10-31 LAB — COMPREHENSIVE METABOLIC PANEL
ALT: 12 U/L (ref 0–35)
AST: 20 U/L (ref 0–37)
Albumin: 4.2 g/dL (ref 3.5–5.2)
Alkaline Phosphatase: 74 U/L (ref 39–117)
BUN: 12 mg/dL (ref 6–23)
CO2: 33 meq/L — ABNORMAL HIGH (ref 19–32)
Calcium: 9.8 mg/dL (ref 8.4–10.5)
Chloride: 103 meq/L (ref 96–112)
Creatinine, Ser: 1.09 mg/dL (ref 0.40–1.20)
GFR: 52.31 mL/min — ABNORMAL LOW (ref >60.00)
Glucose, Bld: 100 mg/dL — ABNORMAL HIGH (ref 70–99)
Potassium: 3.7 meq/L (ref 3.5–5.1)
Sodium: 142 meq/L (ref 135–145)
Total Bilirubin: 0.7 mg/dL (ref 0.2–1.2)
Total Protein: 6.8 g/dL (ref 6.0–8.3)

## 2022-10-31 MED ORDER — ONDANSETRON 4 MG PO TBDP: 4.0000 mg | ORAL_TABLET | Freq: Three times a day (TID) | ORAL | 0 refills | Status: AC | PRN

## 2022-10-31 NOTE — Progress Notes (Signed)
Patient ID: [name], female    DOB: [birth year], 67 y.o.   MRN: [id number]  This visit was conducted in person.  BP 120/72 (BP Location: Right Arm, Patient Position: Sitting, Cuff Size: Normal)   Pulse 83   Temp 98.1 F (36.7 C) (Temporal)   Ht 5\' 7"  (1.702 m)   Wt 127 lb 2 oz (57.7 kg)   SpO2 96%   BMI 19.91 kg/m    CC:  Chief Complaint  Patient presents with   Vomiting    Unable to keep anything down-Started last Friday   Headache    Subjective:   HPI: [name] is a 67 y.o. female patient of [name] with history of  emphysema, constipation, CKD presenting on 10/31/2022 for Vomiting (Unable to keep anything down-Started last Friday) and Headache  Reviewed recent office visit from October 14, 2022 with [name] Nine, NP.  At that point she was having bright red blood per rectum.  Had noted follow-up with [name] October 10 [name].  Felt secondary to constipation. She was treated for UTI (cephalexin 500 mg p.o. twice daily) although urine culture came back negative.  Per pt reports this has all stopped.. no blood in urine, no blood in stool.  EGD done on October 21, 2022 Dr.  Ewing Schlein.. for abd pain. Showed erosive gastropathy  Started on omeprazole 20 mg daily.. she has not started it yet as it has not arrived from mail order. She is taking nexium   Today she reports on 10/19.Marland Kitchen started with vomiting off and on. Has nausea, no diarrhea.  Last episode yesterday..  She is able to keep down water , tea and gingerale... about 12 oz-16 oz.  No fever.  BMs regualr if taking senekot.  Able to keep down meds.  No blood emesis.   Started after  burger from [employer] and tater tots from [employer] the day before.      Relevant past medical, surgical, family and social history reviewed and updated as indicated. Interim medical history since our last visit reviewed. Allergies and medications reviewed and updated. Outpatient Medications Prior to Visit  Medication Sig  Dispense Refill   amLODipine (NORVASC) 5 MG tablet Take 5 mg by mouth daily.     aspirin EC 81 MG EC tablet Take 1 tablet (81 mg total) by mouth daily. Swallow whole. 30 tablet 11   SUMAtriptan (IMITREX) 25 MG tablet Take one tablet po at onset of headache, may repeat in two hours if no improvement. 10 tablet 0   valACYclovir (VALTREX) 500 MG tablet TAKE 1 TABLET BY MOUTH DAILY 100 tablet 2   omeprazole (PRILOSEC) 20 MG capsule Take 20 mg by mouth daily. (Patient not taking: Reported on 10/31/2022)     amLODipine (NORVASC) 10 MG tablet Take 1 daily Take 1 daily     cephALEXin (KEFLEX) 500 MG capsule Take 1 capsule (500 mg total) by mouth 2 (two) times daily. 14 capsule 0   levocetirizine (XYZAL ALLERGY 24HR) 5 MG tablet Take 1 tablet every day by oral route.     No facility-administered medications prior to visit.     Per HPI unless specifically indicated in ROS section below Review of Systems  Constitutional:  Negative for fatigue and fever.  HENT:  Negative for congestion.   Eyes:  Negative for pain.  Respiratory:  Negative for cough and shortness of breath.   Cardiovascular:  Negative for chest pain, palpitations and leg swelling.  Gastrointestinal:  Positive for  nausea and vomiting. Negative for abdominal pain, constipation and diarrhea.  Genitourinary:  Negative for dysuria and vaginal bleeding.  Musculoskeletal:  Negative for back pain.  Neurological:  Negative for syncope, light-headedness and headaches.  Psychiatric/Behavioral:  Negative for dysphoric mood.    Objective:  BP 120/72 (BP Location: Right Arm, Patient Position: Sitting, Cuff Size: Normal)   Pulse 83   Temp 98.1 F (36.7 C) (Temporal)   Ht 5\' 7"  (1.702 m)   Wt 127 lb 2 oz (57.7 kg)   SpO2 96%   BMI 19.91 kg/m   Wt Readings from Last 3 Encounters:  10/31/22 127 lb 2 oz (57.7 kg)  10/14/22 128 lb (58.1 kg)  08/27/22 131 lb (59.4 kg)      Physical Exam Constitutional:      General: She is not in acute  distress.    Appearance: Normal appearance. She is well-developed. She is not ill-appearing or toxic-appearing.  HENT:     Head: Normocephalic.     Right Ear: Hearing, tympanic membrane, ear canal and external ear normal. Tympanic membrane is not erythematous, retracted or bulging.     Left Ear: Hearing, tympanic membrane, ear canal and external ear normal. Tympanic membrane is not erythematous, retracted or bulging.     Nose: No mucosal edema or rhinorrhea.     Right Sinus: No maxillary sinus tenderness or frontal sinus tenderness.     Left Sinus: No maxillary sinus tenderness or frontal sinus tenderness.     Mouth/Throat:     Mouth: Oropharynx is clear and moist and mucous membranes are normal.     Pharynx: Uvula midline.  Eyes:     General: Lids are normal. Lids are everted, no foreign bodies appreciated.     Extraocular Movements: EOM normal.     Conjunctiva/sclera: Conjunctivae normal.     Pupils: Pupils are equal, round, and reactive to light.  Neck:     Thyroid: No thyroid mass or thyromegaly.     Vascular: No carotid bruit.     Trachea: Trachea normal.  Cardiovascular:     Rate and Rhythm: Normal rate and regular rhythm.     Pulses: Normal pulses.     Heart sounds: Normal heart sounds, S1 normal and S2 normal. No murmur heard.    No friction rub. No gallop.  Pulmonary:     Effort: Pulmonary effort is normal. No tachypnea or respiratory distress.     Breath sounds: Normal breath sounds. No decreased breath sounds, wheezing, rhonchi or rales.  Abdominal:     General: Bowel sounds are normal.     Palpations: Abdomen is soft.     Tenderness: There is abdominal tenderness in the epigastric area.  Musculoskeletal:     Cervical back: Normal range of motion and neck supple.  Skin:    General: Skin is warm, dry and intact.     Findings: No rash.  Neurological:     Mental Status: She is alert.  Psychiatric:        Mood and Affect: Mood is not anxious or depressed.         Speech: Speech normal.        Behavior: Behavior normal. Behavior is cooperative.        Thought Content: Thought content normal.        Cognition and Memory: Cognition and memory normal.        Judgment: Judgment normal.       Results for orders placed or performed in visit on  10/14/22  Urine Culture   Specimen: Blood  Result Value Ref Range   MICRO NUMBER: [id number]    SPECIMEN QUALITY: Adequate    Sample Source NOT GIVEN    STATUS: FINAL    Result:      Mixed genital flora isolated. These superficial bacteria are not indicative of a urinary tract infection. No further organism identification is warranted on this specimen. If clinically indicated, recollect clean-catch, mid-stream urine and transfer  immediately to Urine Culture Transport Tube.   CBC  Result Value Ref Range   WBC 5.8 4.0 - 10.5 K/uL   RBC 5.05 3.87 - 5.11 Mil/uL   Platelets 247.0 150.0 - 400.0 K/uL   Hemoglobin 15.0 12.0 - 15.0 g/dL   HCT 30.8 65.7 - 84.6 %   MCV 89.1 78.0 - 100.0 fl   MCHC 33.4 30.0 - 36.0 g/dL   RDW 96.2 95.2 - 84.1 %  Comprehensive metabolic panel  Result Value Ref Range   Sodium 142 135 - 145 mEq/L   Potassium 4.2 3.5 - 5.1 mEq/L   Chloride 105 96 - 112 mEq/L   CO2 31 19 - 32 mEq/L   Glucose, Bld 113 (H) 70 - 99 mg/dL   BUN 20 6 - 23 mg/dL   Creatinine, Ser 3.24 (H) 0.40 - 1.20 mg/dL   Total Bilirubin 0.5 0.2 - 1.2 mg/dL   Alkaline Phosphatase 87 39 - 117 U/L   AST 19 0 - 37 U/L   ALT 11 0 - 35 U/L   Total Protein 6.5 6.0 - 8.3 g/dL   Albumin 4.2 3.5 - 5.2 g/dL   GFR 40.10 (L) >27.25 mL/min   Calcium 10.1 8.4 - 10.5 mg/dL  POCT urinalysis dipstick  Result Value Ref Range   Color, UA dark red    Clarity, UA cloudy    Glucose, UA Positive (A) Negative   Bilirubin, UA pos    Ketones, UA pos    Spec Grav, UA >=1.030 (A) 1.010 - 1.025   Blood, UA pos    pH, UA 5.0 5.0 - 8.0   Protein, UA Positive (A) Negative   Urobilinogen, UA 1.0 0.2 or 1.0 E.U./dL   Nitrite, UA pos     Leukocytes, UA Large (3+) (A) Negative   Appearance     Odor      Assessment and Plan  Nausea and vomiting, unspecified vomiting type Assessment & Plan: Acute, now on 1 week.  Possibly secondary to recently diagnosed erosive gastropathy versus food intolerance vs food borne toxin.  Viral gastroenteritis less likely given no fever no diarrhea. Will have her increase Nexium to 40 mg p.o. daily.  She was given Zofran 4 mg to use every 8 hours as needed for nausea, sublingual.  Encouraged patient to keep up with fluid intake.  No current evidence of dehydration, normal blood pressure and heart rate.  Patient not lightheaded. Recommended slow advance of diet as tolerated.  Follow-up with [name] if not improving as expected.  Orders: -     Comprehensive metabolic panel -     CBC with Differential/Platelet -     Lipase  Erosive gastropathy  Other orders -     Ondansetron; Take 1 tablet (4 mg total) by mouth every 8 (eight) hours as needed for nausea or vomiting.  Dispense: 20 tablet; Refill: 0    Return if symptoms worsen or fail to improve.   Kerby Nora, MD

## 2022-10-31 NOTE — Assessment & Plan Note (Addendum)
Acute, now on 1 week.  Possibly secondary to recently diagnosed erosive gastropathy versus food intolerance vs food borne toxin.  Viral gastroenteritis less likely given no fever no diarrhea. Will have her increase Nexium to 40 mg p.o. daily.  She was given Zofran 4 mg to use every 8 hours as needed for nausea, sublingual.  Will evaluate with labs for electrolyte deficiency, new kidney liver or pancreatic issue.  Encouraged patient to keep up with fluid intake.  No current evidence of dehydration, normal blood pressure and heart rate.  Patient not lightheaded. Recommended slow advance of diet as tolerated.  Follow-up with GI if not improving as expected.

## 2022-10-31 NOTE — Patient Instructions (Addendum)
Temporarily increase nexium to 2 tabs daily.  Can use zofran as  needed for nausea every 8 hours.  Please stop at the lab to have labs drawn.  Gradually advance diet.  If unable to keep down fluids, severe abd pain... go to ER.

## 2022-11-11 ENCOUNTER — Telehealth: Payer: Self-pay | Admitting: Family

## 2022-11-11 NOTE — Telephone Encounter (Signed)
Spoke with pt and she is aware of Tabitha's response. Pt was already scheduled for a nurse visit on 11/12/2022. Nothing further was needed.

## 2022-11-11 NOTE — Telephone Encounter (Signed)
Prevnar 20.  Ok for both of these (flu and prevnar)

## 2022-11-11 NOTE — Telephone Encounter (Signed)
Pt called asking to schedule flu & pneumonia shot. Pt asked which pneumonia shot should she get? Call back # [phone]

## 2022-11-12 ENCOUNTER — Ambulatory Visit (INDEPENDENT_AMBULATORY_CARE_PROVIDER_SITE_OTHER): Payer: 59

## 2022-11-12 DIAGNOSIS — Z23 Encounter for immunization: Secondary | ICD-10-CM | POA: Diagnosis not present

## 2022-11-12 NOTE — Progress Notes (Signed)
Per orders of Mort Sawyers, NP, injection of high dose 65+ flu vaccine in right deltoid and prevnar 20 given in left deltoid by Lewanda Rife Patient tolerated injection well.

## 2022-11-13 DIAGNOSIS — Z1212 Encounter for screening for malignant neoplasm of rectum: Secondary | ICD-10-CM | POA: Diagnosis not present

## 2022-11-13 DIAGNOSIS — Z1211 Encounter for screening for malignant neoplasm of colon: Secondary | ICD-10-CM | POA: Diagnosis not present

## 2022-11-14 ENCOUNTER — Other Ambulatory Visit: Payer: Self-pay | Admitting: Family

## 2022-11-19 ENCOUNTER — Ambulatory Visit (INDEPENDENT_AMBULATORY_CARE_PROVIDER_SITE_OTHER): Payer: 59 | Admitting: Family Medicine

## 2022-11-19 ENCOUNTER — Encounter: Payer: Self-pay | Admitting: Family Medicine

## 2022-11-19 VITALS — BP 102/70 | HR 95 | Temp 98.0°F | Ht 67.0 in | Wt 125.0 lb

## 2022-11-19 DIAGNOSIS — K3189 Other diseases of stomach and duodenum: Secondary | ICD-10-CM | POA: Diagnosis not present

## 2022-11-19 DIAGNOSIS — R112 Nausea with vomiting, unspecified: Secondary | ICD-10-CM

## 2022-11-19 NOTE — Assessment & Plan Note (Addendum)
Acute worsening of chronic issue In discussion with patient symptoms seem to be recurrent and chronic.  Recent evaluation unremarkable except for EGD showing erosive gastropathy.  She never tried higher dose of Nexium.  She is currently taking omeprazole 20 mg p.o. daily and I have instructed her to increase it to 40 mg p.o. daily at least temporarily.  If her symptoms are not improving she will follow-up with Dr. Ewing Schlein, GI She will continue to work on keeping down fluids, she will advance diet as tolerated.  She has Zofran to use to help with nausea.  Return and ER precautions provided

## 2022-11-19 NOTE — Patient Instructions (Signed)
Increase omeprazole to 40 mg daily.  Call GI MD Dr. Ewing Schlein.

## 2022-11-19 NOTE — Progress Notes (Signed)
Patient ID: [name], female    DOB: Aug 18, 1955, 67 y.o.   MRN: [id number]  This visit was conducted in person.  BP 102/70 (BP Location: Left Arm, Patient Position: Sitting, Cuff Size: Normal)   Pulse 95   Temp 98 F (36.7 C) (Oral)   Ht 5\' 7"  (1.702 m)   Wt 125 lb (56.7 kg)   SpO2 98%   BMI 19.58 kg/m    CC:  Chief Complaint  Patient presents with   Nausea    And vomiting x weeks. Patient states it comes in spells with getting better and then sick again. Last seen 10/31/22.    Subjective:   HPI: [name] is a 67 y.o. female patient of Mort Sawyers with history of constipation, emphysema, stage III chronic kidney disease, hypertension presenting on 11/19/2022 for Nausea (And vomiting x weeks. Patient states it comes in spells with getting better and then sick again. Last seen 10/31/22.)  EGD done on October 21, 2022 Dr.  Ewing Schlein.. for abd pain. Showed erosive gastropathy  She reports beginning on October 19 she started with intermittent nausea and vomiting. I saw her on October 25 for this issue. Felt possibly secondary to recently diagnosed erosive gastropathy versus food intolerance versus food borne toxin Had her start Nexium 40 mg daily and was given Zofran 4 mg to use as needed. Lipase normal CBC  nml wbc, no anemia CMET nml  except stable CKD GFR 52  Today she reports that she has continued having issues   She was doing well until Monday.. Started   after eating breakfast.. not different than usual diet.  No bloody emesis.  No acid taste in throat.  No diarrhea.   She has been using omeprazole  20 mg  that he prescribed instead.  She has not yet discussed this with Dr. Ewing Schlein but does state that in the past he has said that she "has always had a sensitive stomach and that this is never going to go away"  Relevant past medical, surgical, family and social history reviewed and updated as indicated. Interim medical history since our last visit  reviewed. Allergies and medications reviewed and updated. Outpatient Medications Prior to Visit  Medication Sig Dispense Refill   amLODipine (NORVASC) 5 MG tablet TAKE 1 TABLET BY MOUTH DAILY 100 tablet 2   aspirin EC 81 MG EC tablet Take 1 tablet (81 mg total) by mouth daily. Swallow whole. 30 tablet 11   omeprazole (PRILOSEC) 20 MG capsule Take 20 mg by mouth daily.     ondansetron (ZOFRAN-ODT) 4 MG disintegrating tablet Take 1 tablet (4 mg total) by mouth every 8 (eight) hours as needed for nausea or vomiting. 20 tablet 0   SUMAtriptan (IMITREX) 25 MG tablet Take one tablet po at onset of headache, may repeat in two hours if no improvement. 10 tablet 0   valACYclovir (VALTREX) 500 MG tablet TAKE 1 TABLET BY MOUTH DAILY 100 tablet 2   No facility-administered medications prior to visit.     Per HPI unless specifically indicated in ROS section below Review of Systems  Constitutional:  Negative for fatigue and fever.  HENT:  Negative for congestion.   Eyes:  Negative for pain.  Respiratory:  Negative for cough and shortness of breath.   Cardiovascular:  Negative for chest pain, palpitations and leg swelling.  Gastrointestinal:  Negative for abdominal pain.  Genitourinary:  Negative for dysuria and vaginal bleeding.  Musculoskeletal:  Negative for back pain.  Neurological:  Negative for syncope, light-headedness and headaches.  Psychiatric/Behavioral:  Negative for dysphoric mood.    Objective:  BP 102/70 (BP Location: Left Arm, Patient Position: Sitting, Cuff Size: Normal)   Pulse 95   Temp 98 F (36.7 C) (Oral)   Ht 5\' 7"  (1.702 m)   Wt 125 lb (56.7 kg)   SpO2 98%   BMI 19.58 kg/m   Wt Readings from Last 3 Encounters:  11/19/22 125 lb (56.7 kg)  10/31/22 127 lb 2 oz (57.7 kg)  10/14/22 128 lb (58.1 kg)      Physical Exam Constitutional:      General: She is not in acute distress.    Appearance: Normal appearance. She is well-developed. She is not ill-appearing or  toxic-appearing.  HENT:     Head: Normocephalic.     Right Ear: Hearing, tympanic membrane, ear canal and external ear normal. Tympanic membrane is not erythematous, retracted or bulging.     Left Ear: Hearing, tympanic membrane, ear canal and external ear normal. Tympanic membrane is not erythematous, retracted or bulging.     Nose: No mucosal edema or rhinorrhea.     Right Sinus: No maxillary sinus tenderness or frontal sinus tenderness.     Left Sinus: No maxillary sinus tenderness or frontal sinus tenderness.     Mouth/Throat:     Mouth: Oropharynx is clear and moist and mucous membranes are normal.     Pharynx: Uvula midline.  Eyes:     General: Lids are normal. Lids are everted, no foreign bodies appreciated.     Extraocular Movements: EOM normal.     Conjunctiva/sclera: Conjunctivae normal.     Pupils: Pupils are equal, round, and reactive to light.  Neck:     Thyroid: No thyroid mass or thyromegaly.     Vascular: No carotid bruit.     Trachea: Trachea normal.  Cardiovascular:     Rate and Rhythm: Normal rate and regular rhythm.     Pulses: Normal pulses.     Heart sounds: Normal heart sounds, S1 normal and S2 normal. No murmur heard.    No friction rub. No gallop.  Pulmonary:     Effort: Pulmonary effort is normal. No tachypnea or respiratory distress.     Breath sounds: Normal breath sounds. No decreased breath sounds, wheezing, rhonchi or rales.  Abdominal:     General: Bowel sounds are normal.     Palpations: Abdomen is soft.     Tenderness: There is no abdominal tenderness.  Musculoskeletal:     Cervical back: Normal range of motion and neck supple.  Skin:    General: Skin is warm, dry and intact.     Findings: No rash.  Neurological:     Mental Status: She is alert.  Psychiatric:        Mood and Affect: Mood is not anxious or depressed.        Speech: Speech normal.        Behavior: Behavior normal. Behavior is cooperative.        Thought Content: Thought  content normal.        Cognition and Memory: Cognition and memory normal.        Judgment: Judgment normal.       Results for orders placed or performed in visit on 10/31/22  Comprehensive metabolic panel  Result Value Ref Range   Sodium 142 135 - 145 mEq/L   Potassium 3.7 3.5 - 5.1 mEq/L   Chloride 103 96 -  112 mEq/L   CO2 33 (H) 19 - 32 mEq/L   Glucose, Bld 100 (H) 70 - 99 mg/dL   BUN 12 6 - 23 mg/dL   Creatinine, Ser 4.09 0.40 - 1.20 mg/dL   Total Bilirubin 0.7 0.2 - 1.2 mg/dL   Alkaline Phosphatase 74 39 - 117 U/L   AST 20 0 - 37 U/L   ALT 12 0 - 35 U/L   Total Protein 6.8 6.0 - 8.3 g/dL   Albumin 4.2 3.5 - 5.2 g/dL   GFR 81.19 (L) >14.78 mL/min   Calcium 9.8 8.4 - 10.5 mg/dL  CBC with Differential/Platelet  Result Value Ref Range   WBC 6.1 4.0 - 10.5 K/uL   RBC 5.04 3.87 - 5.11 Mil/uL   Hemoglobin 14.6 12.0 - 15.0 g/dL   HCT 29.5 62.1 - 30.8 %   MCV 86.9 78.0 - 100.0 fl   MCHC 33.5 30.0 - 36.0 g/dL   RDW 65.7 84.6 - 96.2 %   Platelets 238.0 150.0 - 400.0 K/uL   Neutrophils Relative % 67.5 43.0 - 77.0 %   Lymphocytes Relative 23.1 12.0 - 46.0 %   Monocytes Relative 6.9 3.0 - 12.0 %   Eosinophils Relative 1.9 0.0 - 5.0 %   Basophils Relative 0.6 0.0 - 3.0 %   Neutro Abs 4.1 1.4 - 7.7 K/uL   Lymphs Abs 1.4 0.7 - 4.0 K/uL   Monocytes Absolute 0.4 0.1 - 1.0 K/uL   Eosinophils Absolute 0.1 0.0 - 0.7 K/uL   Basophils Absolute 0.0 0.0 - 0.1 K/uL  Lipase  Result Value Ref Range   Lipase 38.0 11.0 - 59.0 U/L    Assessment and Plan  Nausea and vomiting, unspecified vomiting type Assessment & Plan: Acute worsening of chronic issue In discussion with patient symptoms seem to be recurrent and chronic.  Recent evaluation unremarkable except for EGD showing erosive gastropathy.  She never tried higher dose of Nexium.  She is currently taking omeprazole 20 mg p.o. daily and I have instructed her to increase it to 40 mg p.o. daily at least temporarily.  If her symptoms are  not improving she will follow-up with Dr. Ewing Schlein, GI She will continue to work on keeping down fluids, she will advance diet as tolerated.  She has Zofran to use to help with nausea.  Return and ER precautions provided   Erosive gastropathy    No follow-ups on file.   Kerby Nora, MD

## 2022-11-21 LAB — COLOGUARD: COLOGUARD: POSITIVE — AB

## 2022-11-23 ENCOUNTER — Other Ambulatory Visit: Payer: Self-pay | Admitting: Family

## 2022-11-23 DIAGNOSIS — R195 Other fecal abnormalities: Secondary | ICD-10-CM

## 2022-11-23 NOTE — Progress Notes (Signed)
Please call pt.  Her cologuard is positive which means she really needs to get a colonoscopy to investigate why it is positive. Could be a polyp to something more serious, I will place the referral for GI and they should reach out to her in the next 1-2 weeks.

## 2022-11-24 ENCOUNTER — Telehealth: Payer: Self-pay | Admitting: Family

## 2022-11-24 NOTE — Telephone Encounter (Signed)
Spoke with pt and she needs a copy of her Cologuard results to Dr. Ewing Schlein. These have been faxed. Nothing further was needed.

## 2022-11-24 NOTE — Telephone Encounter (Signed)
Patient called in stating that her GI Doctor Dr Ewing Schlein  needs copy of results from cologuard,so that he can schedule her for a colonoscopy since it was positive/abnormal.

## 2022-11-27 DIAGNOSIS — D125 Benign neoplasm of sigmoid colon: Secondary | ICD-10-CM | POA: Diagnosis not present

## 2022-11-27 DIAGNOSIS — R195 Other fecal abnormalities: Secondary | ICD-10-CM | POA: Diagnosis not present

## 2022-11-27 DIAGNOSIS — D123 Benign neoplasm of transverse colon: Secondary | ICD-10-CM | POA: Diagnosis not present

## 2022-11-27 DIAGNOSIS — K635 Polyp of colon: Secondary | ICD-10-CM | POA: Diagnosis not present

## 2022-11-27 LAB — HM COLONOSCOPY

## 2022-12-01 ENCOUNTER — Telehealth: Payer: Self-pay | Admitting: Family

## 2022-12-01 DIAGNOSIS — K635 Polyp of colon: Secondary | ICD-10-CM | POA: Diagnosis not present

## 2022-12-01 DIAGNOSIS — D123 Benign neoplasm of transverse colon: Secondary | ICD-10-CM | POA: Diagnosis not present

## 2022-12-01 DIAGNOSIS — D125 Benign neoplasm of sigmoid colon: Secondary | ICD-10-CM | POA: Diagnosis not present

## 2022-12-01 NOTE — Telephone Encounter (Signed)
Pt called asking if [name] has a copy of her most recent MRI? Pt states her neurologist at [facility] wants to review the results. Pt asked if results could be faxed to her neurologist? Pt states she'll call back with the office's fax #. Call back # [phone]

## 2022-12-01 NOTE — Telephone Encounter (Signed)
Spoke with pt and she is aware of the below information. She would still like [place] to fax it over to [phone], this has been done. Nothing further was needed.

## 2022-12-01 NOTE — Telephone Encounter (Signed)
[facility] should be able to view the report of this MRI in Care Everywhere. LM for pt to return call.

## 2022-12-03 ENCOUNTER — Encounter: Payer: Self-pay | Admitting: *Deleted

## 2022-12-08 DIAGNOSIS — R2681 Unsteadiness on feet: Secondary | ICD-10-CM | POA: Diagnosis not present

## 2022-12-08 DIAGNOSIS — Z8669 Personal history of other diseases of the nervous system and sense organs: Secondary | ICD-10-CM | POA: Diagnosis not present

## 2022-12-08 DIAGNOSIS — Z8673 Personal history of transient ischemic attack (TIA), and cerebral infarction without residual deficits: Secondary | ICD-10-CM | POA: Diagnosis not present

## 2022-12-11 ENCOUNTER — Other Ambulatory Visit: Payer: Self-pay | Admitting: Neurology

## 2022-12-11 DIAGNOSIS — Z8673 Personal history of transient ischemic attack (TIA), and cerebral infarction without residual deficits: Secondary | ICD-10-CM

## 2022-12-15 ENCOUNTER — Ambulatory Visit
Admission: RE | Admit: 2022-12-15 | Discharge: 2022-12-15 | Disposition: A | Discharge status: Home / Self Care | Payer: 59 | Source: Ambulatory Visit | Attending: Neurology | Admitting: Neurology

## 2022-12-15 DIAGNOSIS — H539 Unspecified visual disturbance: Secondary | ICD-10-CM | POA: Diagnosis not present

## 2022-12-15 DIAGNOSIS — Z8673 Personal history of transient ischemic attack (TIA), and cerebral infarction without residual deficits: Secondary | ICD-10-CM | POA: Insufficient documentation

## 2022-12-15 DIAGNOSIS — I6782 Cerebral ischemia: Secondary | ICD-10-CM | POA: Diagnosis not present

## 2022-12-15 DIAGNOSIS — R251 Tremor, unspecified: Secondary | ICD-10-CM | POA: Diagnosis not present

## 2022-12-15 DIAGNOSIS — G9389 Other specified disorders of brain: Secondary | ICD-10-CM | POA: Diagnosis not present

## 2022-12-18 ENCOUNTER — Other Ambulatory Visit: Payer: Self-pay | Admitting: Gastroenterology

## 2022-12-18 ENCOUNTER — Ambulatory Visit: Payer: 59

## 2022-12-18 VITALS — Ht 67.0 in | Wt 125.0 lb

## 2022-12-18 DIAGNOSIS — Z0000 Encounter for general adult medical examination without abnormal findings: Secondary | ICD-10-CM

## 2022-12-18 DIAGNOSIS — R110 Nausea: Secondary | ICD-10-CM

## 2022-12-18 DIAGNOSIS — R109 Unspecified abdominal pain: Secondary | ICD-10-CM

## 2022-12-18 NOTE — Patient Instructions (Signed)
Ms. Sikkenga , Thank you for taking time to come for your Medicare Wellness Visit. I appreciate your ongoing commitment to your health goals. Please review the following plan we discussed and let me know if I can assist you in the future.   Referrals/Orders/Follow-Ups/Clinician Recommendations: none  This is a list of the screening recommended for you and due dates:  Health Maintenance  Topic Date Due   COVID-19 Vaccine (1) Never done   Zoster (Shingles) Vaccine (1 of 2) Never done   DTaP/Tdap/Td vaccine (2 - Td or Tdap) 01/06/2018   DEXA scan (bone density measurement)  Never done   Mammogram  12/25/2022*   Hepatitis C Screening  12/25/2022*   Medicare Annual Wellness Visit  12/18/2023   Cologuard (Stool DNA test)  11/12/2025   Pneumonia Vaccine  Completed   Flu Shot  Completed   HPV Vaccine  Aged Out   Colon Cancer Screening  Discontinued  *Topic was postponed. The date shown is not the original due date.    Advanced directives: (Copy Requested) Please bring a copy of your health care power of attorney and living will to the office to be added to your chart at your convenience.  Next Medicare Annual Wellness Visit scheduled for next year: Yes 12/21/2023 @ 2:20pm telephone

## 2022-12-18 NOTE — Progress Notes (Signed)
Subjective:   [name] is a 67 y.o. female who presents for Medicare Annual (Subsequent) preventive examination.  Visit Complete: Virtual I connected with  Montez Hageman on 12/18/22 by a audio enabled telemedicine application and verified that I am speaking with the correct person using two identifiers.  Patient Location: Home  Provider Location: Home Office  I discussed the limitations of evaluation and management by telemedicine. The patient expressed understanding and agreed to proceed.  Vital Signs: Because this visit was a virtual/telehealth visit, some criteria may be missing or patient reported. Any vitals not documented were not able to be obtained and vitals that have been documented are patient reported.  Patient Medicare AWV questionnaire was completed by the patient on (not done); I have confirmed that all information answered by patient is correct and no changes since this date.  Cardiac Risk Factors include: advanced age (>62men, >3 women);dyslipidemia;hypertension;sedentary lifestyle     Objective:    Today's Vitals   12/18/22 1419  Weight: 125 lb (56.7 kg)   Body mass index is 19.58 kg/m.     12/18/2022    2:28 PM 12/24/2021    3:30 PM 07/10/2020   11:04 AM 05/08/2020    4:33 PM 11/03/2019    4:28 PM 09/20/2019   11:00 AM 10/17/2017    1:17 PM  Advanced Directives  Does Patient Have a Medical Advance Directive? Yes Yes Yes No No No No  Type of Estate agent of [place];Living will Healthcare Power of [place];Living will Healthcare Power of [place];Living will      Does patient want to make changes to medical advance directive?  No - Patient declined       Copy of Healthcare Power of Attorney in Chart? No - copy requested No - copy requested       Would patient like information on creating a medical advance directive?    No - Patient declined  No - Patient declined No - Patient declined    Current Medications  (verified) Outpatient Encounter Medications as of 12/18/2022  Medication Sig   amLODipine (NORVASC) 5 MG tablet TAKE 1 TABLET BY MOUTH DAILY   aspirin EC 81 MG EC tablet Take 1 tablet (81 mg total) by mouth daily. Swallow whole.   omeprazole (PRILOSEC) 20 MG capsule Take 20 mg by mouth daily.   ondansetron (ZOFRAN-ODT) 4 MG disintegrating tablet Take 1 tablet (4 mg total) by mouth every 8 (eight) hours as needed for nausea or vomiting.   SUMAtriptan (IMITREX) 25 MG tablet Take one tablet po at onset of headache, may repeat in two hours if no improvement.   valACYclovir (VALTREX) 500 MG tablet TAKE 1 TABLET BY MOUTH DAILY   No facility-administered encounter medications on file as of 12/18/2022.    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   DORSAL COMPARTMENT RELEASE Right 02/07/2013   Procedure: RIGHT WRIST DEQUERVAIN RELEASE;  Surgeon: Jodi Marble, MD;  Location: [facility];  Service: Orthopedics;  Laterality: Right;   KIDNEY DONATION  2008   gave her lt kidney to daughter   NECK MASS EXCISION     rt-mass   NECK SURGERY  1990   parotidectomy, one gland removed   SHOULDER ARTHROSCOPY WITH OPEN ROTATOR CUFF REPAIR AND DISTAL CLAVICLE ACROMINECTOMY  Right 04/25/2014   Procedure: SHOULDER ARTHROSCOPY WITH MINI-OPEN ROTATOR CUFF REPAIR AND DISTAL CLAVICLE RESECTION;  Surgeon: Valeria Batman, MD;  Location: [facility];  Service: Orthopedics;  Laterality: Right;   SHOULDER CLOSED REDUCTION Right 08/31/2014   Procedure: CLOSED MANIPULATION SHOULDER;  Surgeon: Valeria Batman, MD;  Location: [facility];  Service: Orthopedics;  Laterality: Right;   SUBACROMIAL DECOMPRESSION Right 04/25/2014   Procedure: SUBACROMIAL DECOMPRESSION;  Surgeon: Valeria Batman, MD;  Location: [facility];  Service: Orthopedics;  Laterality: Right;   THYROID SURGERY     bx right thyroid   Family History  Problem Relation Age of Onset   Hypertension Mother    Heart disease Mother        Irregular heart beat    Arrhythmia Mother    Rheum arthritis Mother    Pulmonary fibrosis Mother    Osteoporosis Mother    Colon cancer Mother    Lung cancer Father    COPD Father    Pulmonary fibrosis Father    Hypertension Sister    Hypertension Sister    Aneurysm Brother 11       brain   Heart disease Maternal Grandmother    Heart disease Maternal Aunt    Pulmonary fibrosis Maternal Aunt    Rheum arthritis Maternal Uncle    Pulmonary fibrosis Maternal Uncle    Diabetes Maternal Uncle    Colon polyps Neg Hx    Social History   Socioeconomic History   Marital status: Single    Spouse name: Not on file   Number of children: 5   Years of education: Not on file   Highest education level: 11th grade  Occupational History   Occupation: Disabled    Employer: [employer]    Comment: assembly line  Tobacco Use   Smoking status: Former    Current packs/day: 0.00    Average packs/day: 0.3 packs/day for 54.0 years (13.5 ttl pk-yrs)    Types: Cigarettes, E-cigarettes    Start date: 10/24/1960    Quit date: 10/25/2014    Years since quitting: 8.1   Smokeless tobacco: Never  Vaping Use   Vaping status: Never Used   Substance and Sexual Activity   Alcohol use: No   Drug use: No   Sexual activity: Not Currently  Other Topics Concern   Not on file  Social History Narrative   Pt donated kidney to daughter 5 yrs ago.   Lives with son and his family   Right Handed    Drinks 3-4 cups caffeine daily      Social Drivers of Health   Financial Resource Strain: Low Risk  (12/18/2022)   Overall Financial Resource Strain (CARDIA)    Difficulty of Paying Living Expenses: Not hard at all  Food Insecurity: No Food Insecurity (12/18/2022)   Hunger Vital Sign    Worried About Running Out  of Food in the Last Year: Never true    Ran Out of Food in the Last Year: Never true  Transportation Needs: No Transportation Needs (12/18/2022)   PRAPARE - Administrator, Civil Service (Medical): No    Lack of Transportation (Non-Medical): No  Physical Activity: Inactive (12/18/2022)   Exercise Vital Sign    Days of Exercise per Week: 0 days    Minutes of Exercise per Session: 0 min  Stress: Stress Concern Present (12/18/2022)   [employer] of Occupational Health - Occupational Stress Questionnaire    Feeling of Stress : To some extent  Social Connections: Socially Isolated (12/18/2022)   Social Connection and Isolation Panel [NHANES]    Frequency of Communication with Friends and Family: More than three times a week    Frequency of Social Gatherings with Friends and Family: Once a week    Attends Religious Services: Never    Database administrator or Organizations: No    Attends Engineer, structural: Not on file    Marital Status: Divorced    Tobacco Counseling Counseling given: Not Answered   Clinical Intake:  Pre-visit preparation completed: Yes  Pain : No/denies pain   BMI - recorded: 19.58 Nutritional Status: BMI of 19-24  Normal Nutritional Risks: None Diabetes: No  How often do you need to have someone help you when you read instructions, pamphlets, or other written  materials from your doctor or pharmacy?: 1 - Never  Interpreter Needed?: No  Comments: lives with son and family Information entered by :: B.Tameya Kuznia,LPN   Activities of Daily Living    12/18/2022    2:28 PM 12/24/2021    2:54 PM  In your present state of health, do you have any difficulty performing the following activities:  Hearing? 0 0  Vision? 0 0  Difficulty concentrating or making decisions? 0 0  Walking or climbing stairs? 1 0  Dressing or bathing? 0 0  Doing errands, shopping? 0 1  Comment  Family assist as needed  Preparing Food and eating ? N Y  Using the Toilet? N N  In the past six months, have you accidently leaked urine? Y Y  Comment  Managed with daily pad  Do you have problems with loss of bowel control? N N  Managing your Medications? N N  Managing your Finances? N N  Housekeeping or managing your Housekeeping? N Y    Patient Care Team: Mort Sawyers, FNP as PCP - General (Family Medicine) Camille Bal, MD as Consulting Physician (Nephrology) Jena Gauss Gerrit Friends, MD as Consulting Physician (Gastroenterology) Vida Rigger, MD as Consulting Physician (Gastroenterology)  Indicate any recent Medical Services you may have received from other than [facility] providers in the past year (date may be approximate).     Assessment:   This is a routine wellness examination for [name].  Hearing/Vision screen Hearing Screening - Comments:: Hearing good per pt Vision Screening - Comments:: Pt says vision is good after cataract surgery Dr Charlotte Sanes   Goals Addressed             This Visit's Progress    Maintain Healthy Lifestyle   Not on track    Stay active Stay hydrated Healthy diet       Depression Screen    12/18/2022    2:26 PM 10/14/2022   11:59 AM 08/27/2022   11:11 AM 04/14/2022   10:32 AM 02/24/2022    2:16 PM 12/24/2021    3:05 PM  PHQ  2/9 Scores  PHQ - 2 Score 0 0 0 0 0 0  PHQ- 9 Score  0 1  0     Fall Risk    12/18/2022    2:22 PM  10/14/2022   11:59 AM 08/27/2022   11:10 AM 04/14/2022   10:32 AM 02/24/2022    2:16 PM  Fall Risk   Falls in the past year? 0 0 0 0 0  Number falls in past yr: 0 0 0 0 0  Injury with Fall? 0 0 0 0 0  Risk for fall due to : No Fall Risks No Fall Risks No Fall Risks  No Fall Risks  Follow up Falls prevention discussed;Education provided Falls evaluation completed Falls evaluation completed Falls evaluation completed;Education provided;Falls prevention discussed Falls prevention discussed;Education provided;Falls evaluation completed    MEDICARE RISK AT HOME: Medicare Risk at Home Any stairs in or around the home?: Yes If so, are there any without handrails?: Yes Home free of loose throw rugs in walkways, pet beds, electrical cords, etc?: Yes Adequate lighting in your home to reduce risk of falls?: Yes Life alert?: No Use of a cane, walker or w/c?: No Grab bars in the bathroom?: Yes Shower chair or bench in shower?: Yes Elevated toilet seat or a handicapped toilet?: No  TIMED UP AND GO:  Was the test performed?  No    Cognitive Function:        12/18/2022    2:33 PM 12/24/2021    3:31 PM  6CIT Screen  What Year? 0 points 0 points  What month? 0 points 0 points  What time? 0 points 0 points  Count back from 20 0 points 0 points  Months in reverse 0 points 0 points  Repeat phrase 0 points 0 points  Total Score 0 points 0 points    Immunizations Immunization History  Administered Date(s) Administered   Fluad Quad(high Dose 65+) 10/23/2021   Fluad Trivalent(High Dose 65+) 11/12/2022   Influenza Whole 10/15/2006   Influenza,inj,Quad PF,6+ Mos 09/19/2013, 10/02/2014   Influenza-Unspecified 10/22/2017, 10/21/2020   PNEUMOCOCCAL CONJUGATE-20 11/12/2022   Pneumococcal Conjugate-13 09/19/2013   Pneumococcal-Unspecified 01/07/2008   Tdap 01/07/2008    TDAP status: Up to date  Flu Vaccine status: Up to date  Pneumococcal vaccine status: Up to date  Covid-19 vaccine  status: Completed vaccines #1 & 2  Qualifies for Shingles Vaccine? Yes   Zostavax completed No   Shingrix Completed?: No.    Education has been provided regarding the importance of this vaccine. Patient has been advised to call insurance company to determine out of pocket expense if they have not yet received this vaccine. Advised may also receive vaccine at local pharmacy or Health Dept. Verbalized acceptance and understanding.  Screening Tests Health Maintenance  Topic Date Due   MAMMOGRAM  12/25/2022 (Originally 07/15/2017)   Hepatitis C Screening  12/25/2022 (Originally [birth year]/1972)   COVID-19 Vaccine (1) 01/03/2023 (Originally [birth year]1959)   Zoster Vaccines- Shingrix (1 of 2) 03/18/2023 (Originally 08/14/1973)   DTaP/Tdap/Td (2 - Td or Tdap) 12/18/2023 (Originally 01/06/2018)   DEXA SCAN  12/18/2023 (Originally 08/15/2019)   Medicare Annual Wellness (AWV)  12/18/2023   Fecal DNA (Cologuard)  11/12/2025   Pneumonia Vaccine 30+ Years old  Completed   INFLUENZA VACCINE  Completed   HPV VACCINES  Aged Out   Colonoscopy  Discontinued    Health Maintenance  There are no preventive care reminders to display for this patient.   Colorectal cancer screening: Type of  screening: Colonoscopy. Completed 11/2022. Repeat every 5-10 years  MMG -will get through OB-GYN  Bone Density status: Completed pt says she had this.Marland Kitchenand was normalnot found in record. Results reflect: Bone density results: NORMAL. Repeat every 5 years.  Lung Cancer Screening: (Low Dose CT Chest recommended if Age 74-80 years, 20 pack-year currently smoking OR have quit w/in 15years.) does not qualify.   Lung Cancer Screening Referral: no  Additional Screening:  Hepatitis C Screening: does not qualify; Completed no  Vision Screening: Recommended annual ophthalmology exams for early detection of glaucoma and other disorders of the eye. Is the patient up to date with their annual eye exam?  Yes  Who is the provider or what is the  name of the office in which the patient attends annual eye exams? Dr Charlotte Sanes If pt is not established with a provider, would they like to be referred to a provider to establish care? No .   Dental Screening: Recommended annual dental exams for proper oral hygiene  Diabetic Foot Exam: n/a  Community Resource Referral / Chronic Care Management: CRR required this visit?  No   CCM required this visit?  No     Plan:     I have personally reviewed and noted the following in the patient's chart:   Medical and social history Use of alcohol, tobacco or illicit drugs  Current medications and supplements including opioid prescriptions. Patient is not currently taking opioid prescriptions. Functional ability and status Nutritional status Physical activity Advanced directives List of other physicians Hospitalizations, surgeries, and ER visits in previous 12 months Vitals Screenings to include cognitive, depression, and falls Referrals and appointments  In addition, I have reviewed and discussed with patient certain preventive protocols, quality metrics, and best practice recommendations. A written personalized care plan for preventive services as well as general preventive health recommendations were provided to patient.     Sue Lush, LPN   16/10/9602   After Visit Summary: (MyChart) Due to this being a telephonic visit, the after visit summary with patients personalized plan was offered to patient via MyChart   Nurse Notes: Pt says she is doing alright. She has no concerns or questions.

## 2022-12-19 NOTE — Progress Notes (Signed)
Noted  

## 2022-12-19 NOTE — Progress Notes (Signed)
noted 

## 2022-12-25 ENCOUNTER — Ambulatory Visit
Admission: RE | Admit: 2022-12-25 | Discharge: 2022-12-25 | Disposition: A | Discharge status: Home / Self Care | Payer: 59 | Source: Ambulatory Visit | Attending: Gastroenterology | Admitting: Gastroenterology

## 2022-12-25 DIAGNOSIS — R110 Nausea: Secondary | ICD-10-CM | POA: Diagnosis not present

## 2022-12-25 DIAGNOSIS — R109 Unspecified abdominal pain: Secondary | ICD-10-CM | POA: Diagnosis not present

## 2023-01-14 ENCOUNTER — Ambulatory Visit: Payer: 59 | Admitting: Family Medicine

## 2023-01-14 ENCOUNTER — Encounter: Payer: Self-pay | Admitting: Family Medicine

## 2023-01-14 VITALS — BP 108/78 | HR 82 | Temp 98.2°F | Ht 67.0 in | Wt 120.8 lb

## 2023-01-14 DIAGNOSIS — J441 Chronic obstructive pulmonary disease with (acute) exacerbation: Secondary | ICD-10-CM

## 2023-01-14 MED ORDER — ALBUTEROL SULFATE HFA 108 (90 BASE) MCG/ACT IN AERS: 2.0000 | INHALATION_SPRAY | Freq: Four times a day (QID) | RESPIRATORY_TRACT | 2 refills | Status: AC | PRN

## 2023-01-14 MED ORDER — AZITHROMYCIN 250 MG PO TABS: ORAL_TABLET | ORAL | 0 refills | Status: DC

## 2023-01-14 NOTE — Patient Instructions (Signed)
 Complete azithromycin.  Restart Breztri.

## 2023-01-14 NOTE — Assessment & Plan Note (Signed)
 Acute, concern for COPD exacerbation.  Symptoms worsening past 1 week of illness. Will treat with azithromycin  course, lung exam clear, no clear indication for prednisone  course at this time (patient does not tolerate this as it causes nausea).  She will restart Breztri  2 puffs twice daily as previously recommended.  She will use albuterol  2 puffs every 4-6 hours as needed for shortness of breath, coughing fits or wheeze.

## 2023-01-14 NOTE — Progress Notes (Addendum)
 Patient ID: [name], female    DOB: December 12, 1955, 68 y.o.   MRN: [id number]  This visit was conducted in person.  BP 108/78   Pulse 82   Temp 98.2 F (36.8 C) (Oral)   Ht 5' 7 (1.702 m)   Wt 120 lb 12.8 oz (54.8 kg)   SpO2 94%   BMI 18.92 kg/m    CC:  Chief Complaint  Patient presents with   Nasal Congestion   Sore Throat    Ongoing for about a week. Loss of appetitie    Subjective:   HPI: [name] is a 68 y.o. female  patient of [name], with history of  HTN, pulmonary emphysema, presenting on 01/14/2023 for Nasal Congestion and Sore Throat (Ongoing for about a week. Loss of appetitie)   Undergoing work up for chronic N/V. Date of onset:  1 week Initial symptoms included  ST, nasal congestion Symptoms progressed to cough, productive off and on.   No SOB, no wheeze  No fever.  No ear pain, no face pain.  Sick contacts:  none COVID testing:   none     She has tried to treat with  delsym.  Cough not keeping her up at night   On Breztri  .. Has not been using it.   Has history of chronic lung disease emphesema/COPD. Non-smoker.       Relevant past medical, surgical, family and social history reviewed and updated as indicated. Interim medical history since our last visit reviewed. Allergies and medications reviewed and updated. Outpatient Medications Prior to Visit  Medication Sig Dispense Refill   amLODipine  (NORVASC ) 5 MG tablet TAKE 1 TABLET BY MOUTH DAILY 100 tablet 2   aspirin  EC 81 MG EC tablet Take 1 tablet (81 mg total) by mouth daily. Swallow whole. 30 tablet 11   omeprazole  (PRILOSEC) 20 MG capsule Take 20 mg by mouth daily.     ondansetron  (ZOFRAN -ODT) 4 MG disintegrating tablet Take 1 tablet (4 mg total) by mouth every 8 (eight) hours as needed for nausea or vomiting. 20 tablet 0   SUMAtriptan  (IMITREX ) 25 MG tablet Take one tablet po at onset of headache, may repeat in two hours if no improvement. 10 tablet 0   valACYclovir   (VALTREX ) 500 MG tablet TAKE 1 TABLET BY MOUTH DAILY 100 tablet 2   No facility-administered medications prior to visit.     Per HPI unless specifically indicated in ROS section below Review of Systems  Constitutional:  Negative for fatigue and fever.  HENT:  Negative for congestion.   Eyes:  Negative for pain.  Respiratory:  Negative for cough and shortness of breath.   Cardiovascular:  Negative for chest pain, palpitations and leg swelling.  Gastrointestinal:  Negative for abdominal pain.  Genitourinary:  Negative for dysuria and vaginal bleeding.  Musculoskeletal:  Negative for back pain.  Neurological:  Negative for syncope, light-headedness and headaches.  Psychiatric/Behavioral:  Negative for dysphoric mood.    Objective:  BP 108/78   Pulse 82   Temp 98.2 F (36.8 C) (Oral)   Ht 5' 7 (1.702 m)   Wt 120 lb 12.8 oz (54.8 kg)   SpO2 94%   BMI 18.92 kg/m   Wt Readings from Last 3 Encounters:  01/14/23 120 lb 12.8 oz (54.8 kg)  12/18/22 125 lb (56.7 kg)  11/19/22 125 lb (56.7 kg)      Physical Exam Constitutional:      General: She is not in acute  distress.    Appearance: She is well-developed. She is not ill-appearing or toxic-appearing.  HENT:     Head: Normocephalic.     Right Ear: Hearing, tympanic membrane, ear canal and external ear normal. Tympanic membrane is not erythematous, retracted or bulging.     Left Ear: Hearing, tympanic membrane, ear canal and external ear normal. Tympanic membrane is not erythematous, retracted or bulging.     Nose: Mucosal edema and rhinorrhea present.     Right Sinus: No maxillary sinus tenderness or frontal sinus tenderness.     Left Sinus: No maxillary sinus tenderness or frontal sinus tenderness.     Mouth/Throat:     Pharynx: Uvula midline.  Eyes:     General: Lids are normal. Lids are everted, no foreign bodies appreciated.     Conjunctiva/sclera: Conjunctivae normal.     Pupils: Pupils are equal, round, and reactive to  light.  Neck:     Thyroid : No thyroid  mass or thyromegaly.     Vascular: No carotid bruit.     Trachea: Trachea normal.  Cardiovascular:     Rate and Rhythm: Normal rate and regular rhythm.     Pulses: Normal pulses.     Heart sounds: Normal heart sounds, S1 normal and S2 normal. No murmur heard.    No friction rub. No gallop.  Pulmonary:     Effort: Pulmonary effort is normal. No tachypnea or respiratory distress.     Breath sounds: Normal breath sounds. No decreased breath sounds, wheezing, rhonchi or rales.  Musculoskeletal:     Cervical back: Normal range of motion and neck supple.  Skin:    General: Skin is warm and dry.     Findings: No rash.  Neurological:     Mental Status: She is alert.  Psychiatric:        Mood and Affect: Mood is not anxious or depressed.        Speech: Speech normal.        Behavior: Behavior normal. Behavior is cooperative.        Judgment: Judgment normal.       Results for orders placed or performed in visit on 12/18/22  [facility] COLONOSCOPY   Collection Time: 11/27/22  4:38 PM  Result Value Ref Range   [facility] Colonoscopy See Report (in chart) See Report (in chart), Patient Reported    Assessment and Plan  COPD with acute exacerbation (HCC) Assessment & Plan: Acute, concern for COPD exacerbation.  Symptoms worsening past 1 week of illness. Will treat with azithromycin  course, lung exam clear, no clear indication for prednisone  course at this time (patient does not tolerate this as it causes nausea).  She will restart Breztri  2 puffs twice daily as previously recommended.  She will use albuterol  2 puffs every 4-6 hours as needed for shortness of breath, coughing fits or wheeze.   Other orders -     Azithromycin ; 2 tab po x 1 day then 1 tab po daily  Dispense: 6 tablet; Refill: 0 -     Albuterol  Sulfate HFA; Inhale 2 puffs into the lungs every 6 (six) hours as needed for wheezing or shortness of breath.  Dispense: 8 g; Refill: 2    No follow-ups on  file.   Greig Ring, MD

## 2023-01-16 ENCOUNTER — Ambulatory Visit: Payer: Self-pay | Admitting: Family

## 2023-01-16 NOTE — Telephone Encounter (Signed)
 Requesting allergy test  Chief Complaint: diarrhea Symptoms: diarrhea, nausea Frequency: 3 days at least Pertinent Negatives: Patient denies fever, denies vomiting,  Disposition: [] ED /[] Urgent Care (no appt availability in office) / [] Appointment(In office/virtual)/ []  [facility]/ [x] Home Care/ [] Refused Recommended Disposition /[] [facility]/ []  Follow-up with PCP  Additional Notes: Advised to finish abx unless she develops allergic rxn s/s. Pt advised of Home Care advice, pt understand. States I know I'm lactose intolerant, I can't eat black pepper, I can't have tator tots. Pt requesting allergy test. States lost 20 lbs and states she cannot lose anymore.    Copied from CRM [id number]. Topic: Clinical - Pink Word Triage >> Jan 16, 2023  2:30 PM Mercedes MATSU wrote: Reason for Triage: Patient called and said she was prescribed a zpack on Tuesday and ever since then she has been experiencing diarrhea. Patient said that everything she eats is also causing her diarrhea and making her nauseous. She thinks that the zpack is causing the chronic diarrhea. She is also requesting an allergy test to see what she's allergic too. Reason for Disposition  [1] MILD diarrhea AND [2] taking antibiotics  Answer Assessment - Initial Assessment Questions 1. ANTIBIOTIC: What antibiotic are you taking? How many times per day?     zpak 2. ANTIBIOTIC ONSET: When was the antibiotic started?     3 days ago 3. DIARRHEA SEVERITY: How bad is the diarrhea? How many more stools have you had in the past 24 hours than normal?    - NO DIARRHEA (SCALE 0)   - MILD (SCALE 1-3): Few loose or mushy BMs; increase of 1-3 stools over normal daily number of stools; mild increase in ostomy output.   -  MODERATE (SCALE 4-7): Increase of 4-6 stools daily over normal; moderate increase in ostomy output. * SEVERE (SCALE 8-10; OR 'WORST POSSIBLE'): Increase of 7 or more stools daily over normal; moderate  increase in ostomy output; incontinence.     Mild to moderate 4. ONSET: When did the diarrhea begin?      3 days ago 5. BM CONSISTENCY: How loose or watery is the diarrhea?      Orange, loose stool 6. VOMITING: Are you also vomiting? If Yes, ask: How many times in the past 24 hours?      denies 7. ABDOMEN PAIN: Are you having any abdomen pain? If Yes, ask: What does it feel like? (e.g., crampy, dull, intermittent, constant)      denies 9. ORAL INTAKE: If vomiting, Have you been able to drink liquids? How much liquids have you had in the past 24 hours?     Can keep liquids in 10. HYDRATION: Any signs of dehydration? (e.g., dry mouth [not just dry lips], too weak to stand, dizziness, new weight loss) When did you last urinate?       Lost 20 pounds in a month 11. EXPOSURE: Have you traveled to a foreign country recently? Have you been exposed to anyone with diarrhea? Could you have eaten any food that was spoiled?       Went on cruise in July last year and have been sick since, [place] 12. OTHER SYMPTOMS: Do you have any other symptoms? (e.g., fever, blood in stool)       denies  Protocols used: Diarrhea on Antibiotics-A-[facility]

## 2023-01-19 NOTE — Progress Notes (Signed)
 Was pt recenty in the hospital? If yes, have her follow up. Is she seeing neurology? MRI was done and curious if this was from hospital and or neurology? Or maybe even endo.

## 2023-01-19 NOTE — Progress Notes (Signed)
 noted

## 2023-01-19 NOTE — Telephone Encounter (Signed)
 Will discuss with pt at visit. At this time antibiotic is complete, however I would have d/c the antibiotic since diarrhea had increased while taking.

## 2023-01-20 ENCOUNTER — Encounter: Payer: 59 | Admitting: Family

## 2023-01-26 ENCOUNTER — Ambulatory Visit (INDEPENDENT_AMBULATORY_CARE_PROVIDER_SITE_OTHER): Payer: 59 | Admitting: Family

## 2023-01-26 ENCOUNTER — Encounter: Payer: Self-pay | Admitting: Family

## 2023-01-26 ENCOUNTER — Other Ambulatory Visit: Payer: 59

## 2023-01-26 ENCOUNTER — Other Ambulatory Visit: Payer: Self-pay | Admitting: Family

## 2023-01-26 VITALS — BP 110/60 | Temp 98.3°F | Ht 67.0 in | Wt 123.0 lb

## 2023-01-26 DIAGNOSIS — R42 Dizziness and giddiness: Secondary | ICD-10-CM | POA: Diagnosis not present

## 2023-01-26 DIAGNOSIS — R9389 Abnormal findings on diagnostic imaging of other specified body structures: Secondary | ICD-10-CM | POA: Insufficient documentation

## 2023-01-26 DIAGNOSIS — E236 Other disorders of pituitary gland: Secondary | ICD-10-CM | POA: Insufficient documentation

## 2023-01-26 DIAGNOSIS — E782 Mixed hyperlipidemia: Secondary | ICD-10-CM

## 2023-01-26 DIAGNOSIS — N1831 Chronic kidney disease, stage 3a: Secondary | ICD-10-CM

## 2023-01-26 DIAGNOSIS — E162 Hypoglycemia, unspecified: Secondary | ICD-10-CM

## 2023-01-26 DIAGNOSIS — R634 Abnormal weight loss: Secondary | ICD-10-CM | POA: Diagnosis not present

## 2023-01-26 DIAGNOSIS — D582 Other hemoglobinopathies: Secondary | ICD-10-CM

## 2023-01-26 DIAGNOSIS — I10 Essential (primary) hypertension: Secondary | ICD-10-CM

## 2023-01-26 DIAGNOSIS — E538 Deficiency of other specified B group vitamins: Secondary | ICD-10-CM

## 2023-01-26 LAB — LIPID PANEL
Cholesterol: 210 mg/dL — ABNORMAL HIGH (ref 0–200)
HDL: 60.1 mg/dL (ref >39.00)
LDL Cholesterol: 130 mg/dL — ABNORMAL HIGH (ref 0–99)
NonHDL: 150.27
Total CHOL/HDL Ratio: 4
Triglycerides: 101 mg/dL (ref 0.0–149.0)
VLDL: 20.2 mg/dL (ref 0.0–40.0)

## 2023-01-26 LAB — CORTISOL: Cortisol, Plasma: 10.2 ug/dL

## 2023-01-26 LAB — HEMOGLOBIN A1C: Hgb A1c MFr Bld: 5.4 % (ref 4.6–6.5)

## 2023-01-26 LAB — CBC
HCT: 43.1 % (ref 36.0–46.0)
Hemoglobin: 14.5 g/dL (ref 12.0–15.0)
MCHC: 33.7 g/dL (ref 30.0–36.0)
MCV: 88.9 fL (ref 78.0–100.0)
Platelets: 243 10*3/uL (ref 150.0–400.0)
RBC: 4.85 Mil/uL (ref 3.87–5.11)
RDW: 13 % (ref 11.5–15.5)
WBC: 5.3 10*3/uL (ref 4.0–10.5)

## 2023-01-26 LAB — T4, FREE: Free T4: 0.94 ng/dL (ref 0.60–1.60)

## 2023-01-26 LAB — TSH: TSH: 3.03 u[IU]/mL (ref 0.35–5.50)

## 2023-01-26 LAB — FOLLICLE STIMULATING HORMONE: FSH: 99.8 m[IU]/mL

## 2023-01-26 LAB — LUTEINIZING HORMONE: LH: 33.34 m[IU]/mL

## 2023-01-26 NOTE — Assessment & Plan Note (Signed)
Reviewed follow by neurology, Dr. Sherryll Burger.

## 2023-01-26 NOTE — Assessment & Plan Note (Signed)
Discussed diet and high calorie diet to try to increase weight Discussed use of ensure and or boost for increased calories

## 2023-01-26 NOTE — Progress Notes (Signed)
Dr. Sherryll Burger,  Pt requested I complete these labs for your office. Some have resulted for your review. Pending a few others will send them along once they result.

## 2023-01-26 NOTE — Progress Notes (Signed)
Established Patient Office Visit  Subjective:   Patient ID: [name], female    DOB: [birth year]  Age: 68 y.o. MRN: [id number]  CC:  Chief Complaint  Patient presents with   Acute Visit    concerns    HPI: [name] is a 68 y.o. female presenting on 01/26/2023 for Acute Visit (concerns)  MRI brain w/o contrast, for residual left sided facial and arm numbness and left hand tremor. Ordered by Dr. Sherryll Burger, neurology, no acute intracranial abn. Chronic right pca infarct, small vessel ischemia. Prominent pituitary gland increased in size 2021. Unsteady gait, pt was offered physical therapy but declined at the time.   Positive cologuard, completed EGD and colonoscopy, had three polyps   States having her 'sugar drop' she feels lightheaded and faint and then will eat something higher in sugar and this helps her to feel better. Orange juice works the best.   MRI lumbar spine, 08/25/22, no acute findings. However large right sacra tarlov cysts S2, unchanged, she does report loss of urinary control since her stroke. She was having headaches but states has been better with hydrating better with water throughout the day. Right kidney cyst, however per radiology needing no additional follow up, history of nephrectomy left side and with CKD 3a. Last gfr 52, stable. Does report increased memory loss, missed her last appt due to memory.   U/s abd 12/25/22 no acute findings.        ROS: Negative unless specifically indicated above in HPI.   Relevant past medical history reviewed and updated as indicated.   Allergies and medications reviewed and updated.   Current Outpatient Medications:    albuterol (VENTOLIN HFA) 108 (90 Base) MCG/ACT inhaler, Inhale 2 puffs into the lungs every 6 (six) hours as needed for wheezing or shortness of breath., Disp: 8 g, Rfl: 2   amLODipine (NORVASC) 5 MG tablet, TAKE 1 TABLET BY MOUTH DAILY, Disp: 100 tablet, Rfl: 2   aspirin EC 81 MG EC tablet, Take  1 tablet (81 mg total) by mouth daily. Swallow whole., Disp: 30 tablet, Rfl: 11   omeprazole (PRILOSEC) 20 MG capsule, Take 20 mg by mouth daily., Disp: , Rfl:    ondansetron (ZOFRAN-ODT) 4 MG disintegrating tablet, Take 1 tablet (4 mg total) by mouth every 8 (eight) hours as needed for nausea or vomiting., Disp: 20 tablet, Rfl: 0   SUMAtriptan (IMITREX) 25 MG tablet, Take one tablet po at onset of headache, may repeat in two hours if no improvement., Disp: 10 tablet, Rfl: 0   valACYclovir (VALTREX) 500 MG tablet, TAKE 1 TABLET BY MOUTH DAILY, Disp: 100 tablet, Rfl: 2  Allergies  Allergen Reactions   Celebrex [Celecoxib] Anaphylaxis and Hives   Sulfonamide Derivatives Anaphylaxis   Ace Inhibitors Cough   Azithromycin Nausea Only   Dilaudid [Hydromorphone] Nausea And Vomiting    Severe N/V   Erythromycin Nausea And Vomiting   Gabapentin     Patient has a sulfur allergy   Lactose Intolerance (Gi) Diarrhea    cramping   Prozac [Fluoxetine] Other (See Comments)    headaches   Sudafed [Pseudoephedrine Hcl]     Makes "high"   Losartan Palpitations   Mucinex [Guaifenesin Er] Palpitations    Objective:   BP 110/60 (BP Location: Right Arm, Patient Position: Sitting, Cuff Size: Normal)   Temp 98.3 F (36.8 C) (Temporal)   Ht 5\' 7"  (1.702 m)   Wt 123 lb (55.8 kg)   BMI 19.26 kg/m  Physical Exam Constitutional:      General: She is not in acute distress.    Appearance: Normal appearance. She is normal weight. She is not ill-appearing, toxic-appearing or diaphoretic.  HENT:     Head: Normocephalic.  Cardiovascular:     Rate and Rhythm: Normal rate and regular rhythm.  Pulmonary:     Effort: Pulmonary effort is normal.     Breath sounds: Normal breath sounds.  Musculoskeletal:        General: Normal range of motion.  Neurological:     General: No focal deficit present.     Mental Status: She is alert and oriented to person, place, and time. Mental status is at baseline.      Cranial Nerves: Facial asymmetry (left lower lip from stroke) present.     Motor: Weakness (left upper and lower body from stroke) present.  Psychiatric:        Mood and Affect: Mood normal.        Behavior: Behavior normal.        Thought Content: Thought content normal.        Judgment: Judgment normal.     Assessment & Plan:  Abnormal MRI Assessment & Plan: Reviewed follow by neurology, Dr. Sherryll Burger.    Orders: -     Luteinizing hormone -     Follicle stimulating hormone -     TSH -     T4, free -     Prolactin -     Growth hormone -     Estradiol -     Insulin-like growth factor -     Testosterone Total,Free,Bio, Males -     ACTH; Future -     Cortisol  Enlarged pituitary gland (HCC) Assessment & Plan: Reviewed MRI  Pt followed by neurology ordering labs for Dr. Sherryll Burger  Orders: -     Luteinizing hormone -     Follicle stimulating hormone -     TSH -     T4, free -     Prolactin -     Growth hormone -     Estradiol -     Insulin-like growth factor -     Testosterone Total,Free,Bio, Males -     ACTH; Future -     Cortisol  Weight loss Assessment & Plan: Discussed diet and high calorie diet to try to increase weight Discussed use of ensure and or boost for increased calories    Hypoglycemia -     Hemoglobin A1c  Dizziness Assessment & Plan: Hypoglycemia suspected as partial reason. Printed out handout and discussed with pt trying to snack every 2-3 hours  However pt with new finding pituitary gland enlargement as well , workup in progress  Orders: -     CBC  Mixed hyperlipidemia -     Lipid panel  Elevated hemoglobin (HCC) -     CBC     Follow up plan: Return in about 3 months (around 04/26/2023) for f/u CPE.  Mort Sawyers, FNP

## 2023-01-26 NOTE — Progress Notes (Signed)
Cholesterol is much too high. Your goal is less than 70 and LDL is 130. This increases your risk for heart attack and or stroke (and having had a prior stroke, you should already have been on a cholesterol medication) are you willing to start?

## 2023-01-26 NOTE — Assessment & Plan Note (Addendum)
Hypoglycemia suspected as partial reason. Printed out handout and discussed with pt trying to snack every 2-3 hours  However pt with new finding pituitary gland enlargement as well , workup in progress

## 2023-01-26 NOTE — Assessment & Plan Note (Signed)
Reviewed MRI  Pt followed by neurology ordering labs for Dr. Sherryll Burger

## 2023-01-27 ENCOUNTER — Encounter: Payer: Self-pay | Admitting: Family

## 2023-01-27 LAB — GROWTH HORMONE: Growth Hormone: 4.1 ng/mL (ref 0.0–10.0)

## 2023-01-27 NOTE — Progress Notes (Signed)
I apologize, these labs are coming back in pieces.

## 2023-01-28 ENCOUNTER — Other Ambulatory Visit: Payer: 59

## 2023-02-01 LAB — ESTRADIOL, FREE
Estradiol, Free: 0.04 pg/mL
Estradiol: 3 pg/mL

## 2023-02-01 LAB — CP TESTOSTERONE, BIO-FEMALE/CHILDREN
Albumin: 4.1 g/dL (ref 3.6–5.1)
Sex Hormone Binding: 162.8 nmol/L — ABNORMAL HIGH (ref 14–73)
TESTOSTERONE, BIOAVAILABLE: 0.9 ng/dL (ref 0.5–8.5)
Testosterone, Free: 0.5 pg/mL (ref 0.2–5.0)
Testosterone, Total, LC-MS-MS: 16 ng/dL (ref 2–45)

## 2023-02-01 LAB — INSULIN-LIKE GROWTH FACTOR
IGF-I, LC/MS: 94 ng/mL (ref 41–279)
Z-Score (Female): -0.4 {STDV} (ref ?–2.0)

## 2023-02-01 LAB — PROLACTIN: Prolactin: 6 ng/mL

## 2023-02-02 ENCOUNTER — Telehealth: Payer: Self-pay

## 2023-02-02 NOTE — Telephone Encounter (Signed)
Spoke with pt. She is needing her most recent lab results sent to Dr. Sherryll Burger. These all have been faxed to Dr. Sherryll Burger.

## 2023-02-02 NOTE — Telephone Encounter (Signed)
Copied from CRM [id number]. Topic: Clinical - Lab/Test Results >> Feb 02, 2023 11:17 AM [name] wrote: Reason for CRM: Patient is calling for lab work that was completed on 01/26/2023 for Dr.Shah. Per patient Dr.Shah asked patient to call the office to have results sent.

## 2023-02-03 ENCOUNTER — Other Ambulatory Visit (INDEPENDENT_AMBULATORY_CARE_PROVIDER_SITE_OTHER): Payer: 59

## 2023-02-03 DIAGNOSIS — E236 Other disorders of pituitary gland: Secondary | ICD-10-CM | POA: Diagnosis not present

## 2023-02-03 DIAGNOSIS — R9389 Abnormal findings on diagnostic imaging of other specified body structures: Secondary | ICD-10-CM | POA: Diagnosis not present

## 2023-02-05 LAB — ACTH: C206 ACTH: 40 pg/mL (ref 6–50)

## 2023-02-09 ENCOUNTER — Encounter: Payer: Self-pay | Admitting: Family

## 2023-02-24 DIAGNOSIS — I129 Hypertensive chronic kidney disease with stage 1 through stage 4 chronic kidney disease, or unspecified chronic kidney disease: Secondary | ICD-10-CM | POA: Diagnosis not present

## 2023-02-24 DIAGNOSIS — N281 Cyst of kidney, acquired: Secondary | ICD-10-CM | POA: Diagnosis not present

## 2023-02-24 DIAGNOSIS — Z8673 Personal history of transient ischemic attack (TIA), and cerebral infarction without residual deficits: Secondary | ICD-10-CM | POA: Diagnosis not present

## 2023-02-24 DIAGNOSIS — N1831 Chronic kidney disease, stage 3a: Secondary | ICD-10-CM | POA: Diagnosis not present

## 2023-03-05 DIAGNOSIS — G8929 Other chronic pain: Secondary | ICD-10-CM | POA: Diagnosis not present

## 2023-03-05 DIAGNOSIS — M25512 Pain in left shoulder: Secondary | ICD-10-CM | POA: Diagnosis not present

## 2023-03-13 DIAGNOSIS — M25512 Pain in left shoulder: Secondary | ICD-10-CM | POA: Diagnosis not present

## 2023-03-13 DIAGNOSIS — M6281 Muscle weakness (generalized): Secondary | ICD-10-CM | POA: Diagnosis not present

## 2023-03-13 DIAGNOSIS — M799 Soft tissue disorder, unspecified: Secondary | ICD-10-CM | POA: Diagnosis not present

## 2023-03-13 DIAGNOSIS — M25612 Stiffness of left shoulder, not elsewhere classified: Secondary | ICD-10-CM | POA: Diagnosis not present

## 2023-03-16 DIAGNOSIS — M6281 Muscle weakness (generalized): Secondary | ICD-10-CM | POA: Diagnosis not present

## 2023-03-16 DIAGNOSIS — M25612 Stiffness of left shoulder, not elsewhere classified: Secondary | ICD-10-CM | POA: Diagnosis not present

## 2023-03-16 DIAGNOSIS — M25512 Pain in left shoulder: Secondary | ICD-10-CM | POA: Diagnosis not present

## 2023-03-16 DIAGNOSIS — M799 Soft tissue disorder, unspecified: Secondary | ICD-10-CM | POA: Diagnosis not present

## 2023-03-17 DIAGNOSIS — H53002 Unspecified amblyopia, left eye: Secondary | ICD-10-CM | POA: Diagnosis not present

## 2023-03-17 DIAGNOSIS — Z961 Presence of intraocular lens: Secondary | ICD-10-CM | POA: Diagnosis not present

## 2023-03-17 DIAGNOSIS — H52202 Unspecified astigmatism, left eye: Secondary | ICD-10-CM | POA: Diagnosis not present

## 2023-03-18 ENCOUNTER — Ambulatory Visit (INDEPENDENT_AMBULATORY_CARE_PROVIDER_SITE_OTHER): Admitting: Family

## 2023-03-18 ENCOUNTER — Ambulatory Visit
Admission: RE | Admit: 2023-03-18 | Discharge: 2023-03-18 | Disposition: A | Discharge status: Home / Self Care | Source: Ambulatory Visit | Attending: Family | Admitting: Family

## 2023-03-18 ENCOUNTER — Encounter: Payer: Self-pay | Admitting: Family

## 2023-03-18 ENCOUNTER — Ambulatory Visit: Payer: Self-pay | Admitting: Family

## 2023-03-18 VITALS — BP 122/76 | HR 83 | Temp 98.0°F | Ht 67.0 in | Wt 123.4 lb

## 2023-03-18 DIAGNOSIS — K921 Melena: Secondary | ICD-10-CM | POA: Diagnosis not present

## 2023-03-18 DIAGNOSIS — J029 Acute pharyngitis, unspecified: Secondary | ICD-10-CM | POA: Diagnosis not present

## 2023-03-18 DIAGNOSIS — R1031 Right lower quadrant pain: Secondary | ICD-10-CM | POA: Insufficient documentation

## 2023-03-18 DIAGNOSIS — H9201 Otalgia, right ear: Secondary | ICD-10-CM

## 2023-03-18 DIAGNOSIS — R1011 Right upper quadrant pain: Secondary | ICD-10-CM | POA: Insufficient documentation

## 2023-03-18 DIAGNOSIS — N281 Cyst of kidney, acquired: Secondary | ICD-10-CM | POA: Diagnosis not present

## 2023-03-18 LAB — CBC WITH DIFFERENTIAL/PLATELET
Basophils Absolute: 0 10*3/uL (ref 0.0–0.1)
Basophils Relative: 0.7 % (ref 0.0–3.0)
Eosinophils Absolute: 0.1 10*3/uL (ref 0.0–0.7)
Eosinophils Relative: 2.3 % (ref 0.0–5.0)
HCT: 43.6 % (ref 36.0–46.0)
Hemoglobin: 14.8 g/dL (ref 12.0–15.0)
Lymphocytes Relative: 20.8 % (ref 12.0–46.0)
Lymphs Abs: 1.4 10*3/uL (ref 0.7–4.0)
MCHC: 33.8 g/dL (ref 30.0–36.0)
MCV: 88.9 fl (ref 78.0–100.0)
Monocytes Absolute: 0.4 10*3/uL (ref 0.1–1.0)
Monocytes Relative: 6.2 % (ref 3.0–12.0)
Neutro Abs: 4.6 10*3/uL (ref 1.4–7.7)
Neutrophils Relative %: 70 % (ref 43.0–77.0)
Platelets: 216 10*3/uL (ref 150.0–400.0)
RBC: 4.91 Mil/uL (ref 3.87–5.11)
RDW: 12.8 % (ref 11.5–15.5)
WBC: 6.6 10*3/uL (ref 4.0–10.5)

## 2023-03-18 LAB — COMPREHENSIVE METABOLIC PANEL
ALT: 15 U/L (ref 0–35)
AST: 21 U/L (ref 0–37)
Albumin: 4.1 g/dL (ref 3.5–5.2)
Alkaline Phosphatase: 88 U/L (ref 39–117)
BUN: 17 mg/dL (ref 6–23)
CO2: 31 meq/L (ref 19–32)
Calcium: 9.6 mg/dL (ref 8.4–10.5)
Chloride: 106 meq/L (ref 96–112)
Creatinine, Ser: 0.99 mg/dL (ref 0.40–1.20)
GFR: 58.56 mL/min — ABNORMAL LOW (ref >60.00)
Glucose, Bld: 99 mg/dL (ref 70–99)
Potassium: 4.5 meq/L (ref 3.5–5.1)
Sodium: 143 meq/L (ref 135–145)
Total Bilirubin: 0.5 mg/dL (ref 0.2–1.2)
Total Protein: 6.4 g/dL (ref 6.0–8.3)

## 2023-03-18 LAB — POCT I-STAT CREATININE: Creatinine, Ser: 1.1 mg/dL — ABNORMAL HIGH (ref 0.44–1.00)

## 2023-03-18 LAB — POCT RAPID STREP A (OFFICE): Rapid Strep A Screen: NEGATIVE

## 2023-03-18 MED ORDER — IOHEXOL 300 MG/ML  SOLN
85.0000 mL | Freq: Once | INTRAMUSCULAR | Status: AC | PRN
  Administered 2023-03-18: 85 mL via INTRAVENOUS

## 2023-03-18 MED ORDER — IOHEXOL 300 MG/ML  SOLN
30.0000 mL | Freq: Once | INTRAMUSCULAR | Status: AC | PRN
  Administered 2023-03-18: 30 mL via ORAL

## 2023-03-18 NOTE — Addendum Note (Signed)
 Addended by: Vincenza Hews on: 03/18/2023 09:05 AM   Modules accepted: Orders

## 2023-03-18 NOTE — Assessment & Plan Note (Signed)
 Fobt and cbc ordered pending results

## 2023-03-18 NOTE — Assessment & Plan Note (Signed)
 Ddx diverticulitis , cystitis, appendicitis Ruq ddx cholecystitis, liver concern  Cbc cmp ordered Ordering stat ct abd pelvis  Discussed with pt red flag precautions

## 2023-03-18 NOTE — Telephone Encounter (Signed)
 [name] CMA sent me a teams to ask if I was taking care of pt; I had not seen the note. I spoke with pt and she said when seen pt was not given any med for her ear pain but pt is requesting med for the ear pain; pt said right now pain is better at pain level of 3. Pt request cb after note reviewed by Hayden Pedro FNP. Pt understands Wyatt Mage is out of office this afternoon and pt said she could wait until tomorrow morning to hear back about med. [facility]. UC & ED precautions given and pt voiced understanding. Sending note to Hayden Pedro FNP.

## 2023-03-18 NOTE — Telephone Encounter (Signed)
  Chief Complaint: requesting medication Symptoms: right ear pain Frequency: x 3 days Pertinent Negatives: Patient denies fever Disposition: [] ED /[] Urgent Care (no appt availability in office) / [] Appointment(In office/virtual)/ []  [facility]/ [] Home Care/ [] Refused Recommended Disposition /[] [facility]/ [x]  Follow-up with PCP Additional Notes: Patient seen today with PCP, requesting something for her right ear pain. She states it is the same as when she was seen today. Called [facility] and spoke with Renee, informed sending message over high priority.  Copied from CRM [id number]. Topic: Clinical - Red Word Triage >> Mar 18, 2023  3:06 PM Pascal Lux wrote: Red Word that prompted transfer to Nurse Triage: Ear pain. Requesting ear drop or something for the pain. Reason for Disposition  [1] Caller has NON-URGENT question (includes prescribed medication questions) AND [2] triager unable to answer  Answer Assessment - Initial Assessment Questions 1. MAIN CONCERN OR SYMPTOM:  "What is your main concern right now?" "What question do you have?" "What's the main symptom you're worried about?" (e.g., breathing difficulty, cough, fever. pain)     Right ear pain.  2. ONSET: "When did the  ear pain  start?"     X 3 days.  3. BETTER-SAME-WORSE: "Are you getting better, staying the same, or getting worse compared to how you felt at your last visit to the doctor (most recent medical visit)?"     Same.  4. VISIT DATE: "When were you seen?" (Date)     Today 03/18/23.  5. VISIT DOCTOR: "What is the name of the doctor taking care of you now?"     Mort Sawyers FNP.  6. VISIT DIAGNOSIS:  "What was the main symptom or problem that you were seen for?" "Were you given a diagnosis?"      Sore throat.  7. VISIT MEDICINES: "Did the doctor order any new medicines for you to use?" If Yes, ask: "Have you filled the prescription and started taking the medicine?"      Denies any prescriptions or  medications given.  8. NEXT APPOINTMENT: "Have you scheduled a follow-up appointment with your doctor?"     12/21/23.  9. PAIN: "Is there any pain?" If Yes, ask: "How bad is it?"  (Scale 0-10; or mild, moderate, severe)    - NONE (0): no pain    - MILD (1-3): doesn't interfere with normal activities     - MODERATE (4-7): interferes with normal activities or awakens from sleep     - SEVERE (8-10): excruciating pain, unable to do any normal activities     5-6/10.  10. FEVER: "Do you have a fever?" If Yes, ask: "What is it, how was it measured  and when did it start?"       Denies.  11. OTHER SYMPTOMS: "Do you have any other symptoms?"       Denies.  Protocols used: Recent Medical Visit for Illness Follow-up Call-[facility]

## 2023-03-18 NOTE — Progress Notes (Signed)
 Established Patient Office Visit  Subjective:   Patient ID: [name], female    DOB: [birth year]  Age: 68 y.o. MRN: [id number]  CC:  Chief Complaint  Patient presents with   Acute Visit    Sore throat and R ear pain x3 days    HPI: [name] is a 68 y.o. female presenting on 03/18/2023 for Acute Visit (Sore throat and R ear pain x3 days)  Three days ago started with sore throat only on the right side and right ear pain. She states in the am she keeps noticing pain in the right ear. She is taking otc allergy medications/allegra. She will alternate with zyrtec at times as well. No nasal congestion or cough. No fever. She sneezes at night time.   Ongoing abdominal pain, mainly in the right lower quadrant.  She has seen gi in the past, abdominal u/s 12/25/22 no aute process, 1.6 cm cyst right kidney no f/u needed. No nausea or vomiting. Does not burp often. Does not have heartburn. She does note last week stool was black, back to brown this week.   Colonoscopy 11/27/22 with polyps, per report repeat x 5 years    ROS: Negative unless specifically indicated above in HPI.   Relevant past medical history reviewed and updated as indicated.   Allergies and medications reviewed and updated.   Current Outpatient Medications:    albuterol (VENTOLIN HFA) 108 (90 Base) MCG/ACT inhaler, Inhale 2 puffs into the lungs every 6 (six) hours as needed for wheezing or shortness of breath., Disp: 8 g, Rfl: 2   amLODipine (NORVASC) 5 MG tablet, TAKE 1 TABLET BY MOUTH DAILY, Disp: 100 tablet, Rfl: 2   aspirin EC 81 MG EC tablet, Take 1 tablet (81 mg total) by mouth daily. Swallow whole., Disp: 30 tablet, Rfl: 11   omeprazole (PRILOSEC) 20 MG capsule, Take 20 mg by mouth daily., Disp: , Rfl:    ondansetron (ZOFRAN-ODT) 4 MG disintegrating tablet, Take 1 tablet (4 mg total) by mouth every 8 (eight) hours as needed for nausea or vomiting., Disp: 20 tablet, Rfl: 0   SUMAtriptan (IMITREX) 25 MG  tablet, Take one tablet po at onset of headache, may repeat in two hours if no improvement., Disp: 10 tablet, Rfl: 0   valACYclovir (VALTREX) 500 MG tablet, TAKE 1 TABLET BY MOUTH DAILY, Disp: 100 tablet, Rfl: 2  Allergies  Allergen Reactions   Celebrex [Celecoxib] Anaphylaxis and Hives   Sulfonamide Derivatives Anaphylaxis   Ace Inhibitors Cough   Azithromycin Nausea Only   Dilaudid [Hydromorphone] Nausea And Vomiting    Severe N/V   Erythromycin Nausea And Vomiting   Gabapentin     Patient has a sulfur allergy   Lactose Intolerance (Gi) Diarrhea    cramping   Prozac [Fluoxetine] Other (See Comments)    headaches   Sudafed [Pseudoephedrine Hcl]     Makes "high"   Losartan Palpitations   Mucinex [Guaifenesin Er] Palpitations    Objective:   BP 122/76 (BP Location: Left Arm, Patient Position: Sitting, Cuff Size: Normal)   Pulse 83   Temp 98 F (36.7 C) (Temporal)   Ht 5\' 7"  (1.702 m)   Wt 123 lb 6.4 oz (56 kg)   SpO2 96%   BMI 19.33 kg/m    Physical Exam Constitutional:      General: She is not in acute distress.    Appearance: Normal appearance. She is normal weight. She is not ill-appearing, toxic-appearing or diaphoretic.  HENT:  Head: Normocephalic.     Right Ear: Tympanic membrane normal.     Left Ear: Tympanic membrane normal.     Nose: Nose normal.     Mouth/Throat:     Mouth: Mucous membranes are dry.     Pharynx: No oropharyngeal exudate or posterior oropharyngeal erythema.  Eyes:     Extraocular Movements: Extraocular movements intact.     Pupils: Pupils are equal, round, and reactive to light.  Cardiovascular:     Rate and Rhythm: Normal rate and regular rhythm.     Pulses: Normal pulses.     Heart sounds: Normal heart sounds.  Pulmonary:     Effort: Pulmonary effort is normal.     Breath sounds: Normal breath sounds.  Abdominal:     Palpations: Abdomen is soft.     Tenderness: There is abdominal tenderness in the right upper quadrant and right  lower quadrant. There is guarding. There is no rebound. Positive signs include McBurney's sign. Negative signs include Rovsing's sign and psoas sign.     Hernia: No hernia is present.  Musculoskeletal:     Cervical back: Normal range of motion.  Neurological:     General: No focal deficit present.     Mental Status: She is alert and oriented to person, place, and time. Mental status is at baseline.  Psychiatric:        Mood and Affect: Mood normal.        Behavior: Behavior normal.        Thought Content: Thought content normal.        Judgment: Judgment normal.     Assessment & Plan:  Sore throat -     POCT rapid strep A  Melena Assessment & Plan: Fobt and cbc ordered pending results   Orders: -     Fecal occult blood, imunochemical; Future  Right lower quadrant abdominal pain -     CBC with Differential/Platelet -     COMPLETE METABOLIC PANEL WITH GFR -     CT ABDOMEN PELVIS W CONTRAST; Future -     POCT Urinalysis Dipstick (Automated)  Abdominal pain, right upper quadrant Assessment & Plan: Ddx diverticulitis , cystitis, appendicitis Ruq ddx cholecystitis, liver concern  Cbc cmp ordered Ordering stat ct abd pelvis  Discussed with pt red flag precautions  Orders: -     CBC with Differential/Platelet -     COMPLETE METABOLIC PANEL WITH GFR     Follow up plan: Return if symptoms worsen or fail to improve.  Mort Sawyers, FNP

## 2023-03-19 DIAGNOSIS — M799 Soft tissue disorder, unspecified: Secondary | ICD-10-CM | POA: Diagnosis not present

## 2023-03-19 DIAGNOSIS — M25612 Stiffness of left shoulder, not elsewhere classified: Secondary | ICD-10-CM | POA: Diagnosis not present

## 2023-03-19 DIAGNOSIS — M6281 Muscle weakness (generalized): Secondary | ICD-10-CM | POA: Diagnosis not present

## 2023-03-19 DIAGNOSIS — M25512 Pain in left shoulder: Secondary | ICD-10-CM | POA: Diagnosis not present

## 2023-03-19 MED ORDER — PREDNISONE 20 MG PO TABS: ORAL_TABLET | ORAL | 0 refills | Status: DC

## 2023-03-19 NOTE — Telephone Encounter (Signed)
 Spoke with pt and she is aware of Tabitha's message. Nothing further was needed.

## 2023-03-19 NOTE — Telephone Encounter (Signed)
 I didn't see any infection so I will send in some prednisone which might help with the ear pain.

## 2023-03-19 NOTE — Addendum Note (Signed)
 Addended by: Mort Sawyers on: 03/19/2023 09:14 AM   Modules accepted: Orders

## 2023-04-07 DIAGNOSIS — G43019 Migraine without aura, intractable, without status migrainosus: Secondary | ICD-10-CM | POA: Diagnosis not present

## 2023-04-07 DIAGNOSIS — E236 Other disorders of pituitary gland: Secondary | ICD-10-CM | POA: Diagnosis not present

## 2023-04-07 DIAGNOSIS — R9389 Abnormal findings on diagnostic imaging of other specified body structures: Secondary | ICD-10-CM | POA: Diagnosis not present

## 2023-04-08 ENCOUNTER — Ambulatory Visit: Payer: 59 | Admitting: Dermatology

## 2023-04-22 ENCOUNTER — Telehealth: Payer: Self-pay

## 2023-04-22 NOTE — Telephone Encounter (Signed)
 Copied from CRM [id number]. Topic: Clinical - Medication Question >> Apr 21, 2023  3:49 PM Zipporah Him wrote: Reason for CRM: Patient is wanting to know if she can get B12 injections, Dr. Bernetta Brilliant wants her to start taking B12 supplement but she cannot tolerate it, states she gets severe diarrhea taking it with both food and without food. She wants to get it done at this office if possible because it would be more convenient for her to get them here rather than driving to Dr. Bernetta Brilliant every time she needs the injection. Please call to advise, was hoping to speak with [facility]  Lab Results  Component Value Date   VITAMINB12 331 08/27/2022   .

## 2023-04-22 NOTE — Telephone Encounter (Signed)
 We can set her up for b12 injections once a month for three months.

## 2023-04-23 NOTE — Telephone Encounter (Signed)
 Please call patient and schedule monthly B12 injections for the next 3 months on nurse visit schedule.

## 2023-04-28 ENCOUNTER — Ambulatory Visit (INDEPENDENT_AMBULATORY_CARE_PROVIDER_SITE_OTHER)

## 2023-04-28 DIAGNOSIS — E538 Deficiency of other specified B group vitamins: Secondary | ICD-10-CM

## 2023-04-28 MED ORDER — CYANOCOBALAMIN 1000 MCG/ML IJ SOLN
1000.0000 ug | Freq: Once | INTRAMUSCULAR | Status: AC
  Administered 2023-04-28: 1000 ug via INTRAMUSCULAR

## 2023-04-28 NOTE — Progress Notes (Signed)
 Per orders of Felicita Horns, NP injection of Vitamin B12  given by Corinthia Dickinson. Patient tolerated injection well.  Patient has already scheduled her nurse visit for next month for # 2 of 3 B12 injections.

## 2023-05-28 DIAGNOSIS — G8929 Other chronic pain: Secondary | ICD-10-CM | POA: Diagnosis not present

## 2023-05-28 DIAGNOSIS — M25512 Pain in left shoulder: Secondary | ICD-10-CM | POA: Diagnosis not present

## 2023-06-02 ENCOUNTER — Ambulatory Visit
Admission: RE | Admit: 2023-06-02 | Discharge: 2023-06-02 | Disposition: A | Discharge status: Home / Self Care | Source: Ambulatory Visit | Attending: Family | Admitting: Family

## 2023-06-02 ENCOUNTER — Telehealth: Payer: Self-pay

## 2023-06-02 ENCOUNTER — Ambulatory Visit (INDEPENDENT_AMBULATORY_CARE_PROVIDER_SITE_OTHER): Admitting: *Deleted

## 2023-06-02 ENCOUNTER — Other Ambulatory Visit (INDEPENDENT_AMBULATORY_CARE_PROVIDER_SITE_OTHER): Admitting: Family

## 2023-06-02 ENCOUNTER — Ambulatory Visit: Payer: Self-pay | Admitting: Family

## 2023-06-02 DIAGNOSIS — G510 Bell's palsy: Secondary | ICD-10-CM | POA: Insufficient documentation

## 2023-06-02 DIAGNOSIS — R200 Anesthesia of skin: Secondary | ICD-10-CM | POA: Diagnosis not present

## 2023-06-02 DIAGNOSIS — E538 Deficiency of other specified B group vitamins: Secondary | ICD-10-CM

## 2023-06-02 MED ORDER — CYANOCOBALAMIN 1000 MCG/ML IJ SOLN
1000.0000 ug | Freq: Once | INTRAMUSCULAR | Status: AC
  Administered 2023-06-02: 1000 ug via INTRAMUSCULAR

## 2023-06-02 NOTE — Progress Notes (Signed)
 Pt in office today for B12 injection and was noted by medical asst to have left sided facial drooping.   Pt was assessed by me, pt states has had left sided facial mouth drooping x one month and has been drooling more than usual. She states she didn't have a significant event where her or her family members recollected stroke symptoms. She states also for the past one month that she has felt facial numbness on the left side. She denies blurry vision, no slurring of speech or new headache.   MRI brain reviewed from 12/15/22 and moderated sized chronic right PCA infarct noted (known h/o CVA)T2 hyperintensities in cerebral white matter bil with mild progression from prior MRI , pituitary gland prominent. She is followed by neurology Dr. Mason Sole last seen April 1st, 2025, she states this was prior to symptoms worsening with left sided facial numbness. Per his note this was noted during visit however can not assess extent of her severity of symptoms because of her verbal discussion that this has worsened.   Ordering stat CT head today pending results to r/o acute stroke new from prior  Discussed red flag precautions with pt.  Pt advised to call neurology to schedule sooner appt with him.   Physical Exam Eyes:     General: No visual field deficit. Neurological:     Mental Status: She is alert and oriented to person, place, and time.     Cranial Nerves: Cranial nerve deficit and facial asymmetry (left sided mouth drooping, unable to smile) present.     Sensory: Sensation is intact.     Motor: No weakness (bil grip 5/5).     Coordination: Coordination is intact.     Gait: Gait is intact.

## 2023-06-02 NOTE — Telephone Encounter (Signed)
 Pt was evaluated by me.  See note 5/27.

## 2023-06-02 NOTE — Telephone Encounter (Signed)
 Pt was in office for B 12 injection and mentioned to Shapale CMA more numbness in lt side of face; I spoke with pt; pt said had CVA 2021.pt said for 1 month pt has noticed worsening in numbness in lt side of face and lip more drooped on lt side of face. Pt walking as she normally does. T98.1 P 80 BP 116/70 sitting right arm reg cuff. Pt does not want to go to ED; Angelene Kelly FNP saw pt . Sending note to Angelene Kelly FNP.

## 2023-06-02 NOTE — Progress Notes (Signed)
 Per orders of Felicita Horns, NP, injection of B-12 given by Georg Killian M in left deltoid. Patient tolerated injection well. Patient will make appointment for 1 month.

## 2023-06-17 ENCOUNTER — Other Ambulatory Visit: Payer: Self-pay

## 2023-06-17 DIAGNOSIS — Z8673 Personal history of transient ischemic attack (TIA), and cerebral infarction without residual deficits: Secondary | ICD-10-CM | POA: Diagnosis not present

## 2023-06-17 DIAGNOSIS — E236 Other disorders of pituitary gland: Secondary | ICD-10-CM | POA: Diagnosis not present

## 2023-06-18 DIAGNOSIS — Z1231 Encounter for screening mammogram for malignant neoplasm of breast: Secondary | ICD-10-CM | POA: Diagnosis not present

## 2023-06-23 ENCOUNTER — Ambulatory Visit
Admission: RE | Admit: 2023-06-23 | Discharge: 2023-06-23 | Discharge status: Home / Self Care | Source: Ambulatory Visit

## 2023-06-23 DIAGNOSIS — R9082 White matter disease, unspecified: Secondary | ICD-10-CM | POA: Diagnosis not present

## 2023-06-23 DIAGNOSIS — Z8673 Personal history of transient ischemic attack (TIA), and cerebral infarction without residual deficits: Secondary | ICD-10-CM | POA: Insufficient documentation

## 2023-06-23 DIAGNOSIS — R200 Anesthesia of skin: Secondary | ICD-10-CM | POA: Diagnosis not present

## 2023-07-07 ENCOUNTER — Ambulatory Visit

## 2023-07-07 DIAGNOSIS — E538 Deficiency of other specified B group vitamins: Secondary | ICD-10-CM

## 2023-07-07 MED ORDER — CYANOCOBALAMIN 1000 MCG/ML IJ SOLN
1000.0000 ug | Freq: Once | INTRAMUSCULAR | Status: AC
  Administered 2023-07-07: 1000 ug via INTRAMUSCULAR

## 2023-07-07 NOTE — Progress Notes (Signed)
 Per orders of [name], NP, injection of vitamin b 12 given by [name] in right deltoid. Patient tolerated injection well. Patient last dose  ordered was given today. Pt will call [facility] for further instruction.

## 2023-07-24 ENCOUNTER — Other Ambulatory Visit: Payer: Self-pay | Admitting: Family

## 2023-08-21 ENCOUNTER — Other Ambulatory Visit: Payer: Self-pay | Admitting: Family

## 2023-08-21 DIAGNOSIS — G43C0 Periodic headache syndromes in child or adult, not intractable: Secondary | ICD-10-CM

## 2023-09-20 ENCOUNTER — Other Ambulatory Visit: Payer: Self-pay | Admitting: Family

## 2023-09-20 DIAGNOSIS — G43C0 Periodic headache syndromes in child or adult, not intractable: Secondary | ICD-10-CM

## 2023-10-19 ENCOUNTER — Ambulatory Visit: Payer: Self-pay

## 2023-10-19 DIAGNOSIS — Z8639 Personal history of other endocrine, nutritional and metabolic disease: Secondary | ICD-10-CM | POA: Diagnosis not present

## 2023-10-19 DIAGNOSIS — E041 Nontoxic single thyroid nodule: Secondary | ICD-10-CM | POA: Diagnosis not present

## 2023-10-19 DIAGNOSIS — E236 Other disorders of pituitary gland: Secondary | ICD-10-CM | POA: Diagnosis not present

## 2023-10-19 NOTE — Telephone Encounter (Addendum)
 FYI Only or Action Required?: FYI only for provider.  Patient was last seen in primary care on 03/18/2023 by Corwin Antu, FNP.  Called Nurse Triage reporting Dysuria.  Symptoms began today.  Interventions attempted: Rest, hydration, or home remedies.  Symptoms are: unchanged.  Triage Disposition: See Physician Within 24 Hours  Patient/caregiver understands and will follow disposition?: Yes  **Appt. Scheduled for 10/15; which was the soonest available appt. Per Epic.**        Copied from CRM [id number]. Topic: Clinical - Red Word Triage >> Oct 19, 2023  3:34 PM [place] wrote: Red Word that prompted transfer to Nurse Triage: Patient has UTI- started today. Pain when urinating Reason for Disposition  Age > 50 years  Answer Assessment - Initial Assessment Questions 1. SEVERITY: How bad is the pain?  (e.g., Scale 1-10; mild, moderate, or severe)     Moderate  2. FREQUENCY: How many times have you had painful urination today?      Several   3. PATTERN: Is pain present every time you urinate or just sometimes?      Each time  4. ONSET: When did the painful urination start?      Today 10/13  5. FEVER: Do you have a fever? If Yes, ask: What is your temperature, how was it measured, and when did it start?     No   6. PAST UTI: Have you had a urine infection before? If Yes, ask: When was the last time? and What happened that time?      No   7. CAUSE: What do you think is causing the painful urination?  (e.g., UTI, scratch, Herpes sore)     Possible UTI  8. OTHER SYMPTOMS: Do you have any other symptoms? (e.g., blood in urine, flank pain, genital sores, urgency, vaginal discharge)   No.   Patient stated she has Increased fluid intake for urinary symptoms that began today. Pt. Would also like to get flu shot. Appt. Scheduled for 10/15.  Protocols used: Urination Pain - Female-A-AH

## 2023-10-20 ENCOUNTER — Telehealth: Payer: Self-pay | Admitting: Family

## 2023-10-20 ENCOUNTER — Encounter: Payer: Self-pay | Admitting: Family Medicine

## 2023-10-20 ENCOUNTER — Ambulatory Visit: Admitting: Family Medicine

## 2023-10-20 VITALS — BP 110/88 | HR 74 | Temp 98.2°F | Ht 67.0 in | Wt 125.0 lb

## 2023-10-20 DIAGNOSIS — R300 Dysuria: Secondary | ICD-10-CM | POA: Diagnosis not present

## 2023-10-20 DIAGNOSIS — Z905 Acquired absence of kidney: Secondary | ICD-10-CM

## 2023-10-20 DIAGNOSIS — I69354 Hemiplegia and hemiparesis following cerebral infarction affecting left non-dominant side: Secondary | ICD-10-CM

## 2023-10-20 DIAGNOSIS — N1831 Chronic kidney disease, stage 3a: Secondary | ICD-10-CM

## 2023-10-20 DIAGNOSIS — N3001 Acute cystitis with hematuria: Secondary | ICD-10-CM

## 2023-10-20 LAB — POCT URINALYSIS DIPSTICK
Bilirubin, UA: NEGATIVE
Glucose, UA: NEGATIVE
Ketones, UA: NEGATIVE
Nitrite, UA: NEGATIVE
Protein, UA: NEGATIVE
Spec Grav, UA: 1.01 (ref 1.010–1.025)
Urobilinogen, UA: 0.2 U/dL
pH, UA: 6 (ref 5.0–8.0)

## 2023-10-20 MED ORDER — CEPHALEXIN 500 MG PO CAPS: 500.0000 mg | ORAL_CAPSULE | Freq: Two times a day (BID) | ORAL | 0 refills | Status: DC

## 2023-10-20 NOTE — Patient Instructions (Addendum)
 Flu shot today  Limit bladder irritants like dark sodas, spicy foods, caffeine.  Drink plenty of water.  May use tylenol  for discomfort.   VISIT SUMMARY: Today, you came in with symptoms of a possible urinary tract infection (UTI), including burning and pain during urination. 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 We discussed your symptoms and medical history in detail.  YOUR PLAN: -URINARY TRACT INFECTION (UTI): A UTI is an infection in any part of your urinary system. You have been prescribed Keflex  500 mg to take twice a day for 7 days. We have also sent your urine for a culture test to determine the best antibiotic for you. Please avoid bladder irritants like caffeine and alcohol. [facility] [place].  -CHRONIC KIDNEY DISEASE (CKD): CKD means your kidneys are not working as well as they should. Since you donated a kidney, we need to be careful with medications.   -LEFT-SIDED HEMIPARESIS including facial weakness: Hemiparesis is weakness on one side of the body, which in your case is due to a stroke you had in 2021. This condition is being managed as part of your ongoing care.  INSTRUCTIONS: Please follow up if your symptoms do not improve or if they worsen. Make sure to complete the full course of antibiotics. If you have any new symptoms or concerns, contact our office.

## 2023-10-20 NOTE — Assessment & Plan Note (Signed)
 Story/exam suspicious for UTI.  Sulfa allergy, previously did not tolerate macrobid. Rx keflex  500mg  bid 1 wk course.  Update if ongoing or worsening symptoms. Reviewed avoiding bladder irritants. Offered, declines pyridium Rx.

## 2023-10-20 NOTE — Telephone Encounter (Signed)
 Copied from CRM [id number]. Topic: Clinical - Request for Lab/Test Order >> Oct 20, 2023 12:26 PM Suzen RAMAN wrote: Reason for CRM:  Patient opted to cancel appt with provider tomorrow and would like to know If orders can be placed for her to provide a urine sample for testing to confirm she has a UTI. Patient would like to know if medication can be prescribe for UTI after confirmation.   CB#[phone]

## 2023-10-20 NOTE — Progress Notes (Signed)
 Ph: [phone] Fax: [phone]   Patient ID: [name], female    DOB: 03-09-1955, 68 y.o.   MRN: [id number]  This visit was conducted in person.  BP 110/88   Pulse 74   Temp 98.2 F (36.8 C) (Oral)   Ht 5' 7 (1.702 m)   Wt 125 lb (56.7 kg)   SpO2 95%   BMI 19.58 kg/m    Chief Complaint  Patient presents with   Urinary Tract Infection    Pt C/o burning and pain while urinating. Started 10/19/23.    Subjective:   Discussed the use of AI scribe software for clinical note transcription with the patient, who gave verbal consent to proceed.  History of Present Illness   [name] is a 68 year old female with chronic kidney disease who presents with possible UTI symptoms.  She experiences burning and severe pain during urination that began yesterday and worsened this morning. This is associated with sharp and stabbing pain, localized to the right lower abdomen, and intensifies with urination. There is no hematuria, fever, chills, nausea, or vomiting. She has not had a UTI recently and does not experience them frequently.   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 Persistent right-sided abdominal pain is present, with a history of partial hysterectomy in 1991. A CT scan 03/2023 showed no significant findings. There is no flank pain or history of symptomatic kidney stones.  She had a stroke in 2021, resulting in left-sided weakness, numbness, and drooling. Her left leg shakes, her face is numb, and her hands tingle with a pins and needles sensation. She experiences bladder urgency and incontinence, requiring the use of pads which she changes frequently throughout the day. There has been no recent antibiotic use.     She notes she like dark sodas and caffeine.  She tries to avoid spicy foods but family members do cook with spice.   Ex smoker - quit at age 52yo     Relevant past medical, surgical, family and social  history reviewed and updated as indicated. Interim medical history since our last visit reviewed. Allergies and medications reviewed and updated. Outpatient Medications Prior to Visit  Medication Sig Dispense Refill   albuterol  (VENTOLIN  HFA) 108 (90 Base) MCG/ACT inhaler Inhale 2 puffs into the lungs every 6 (six) hours as needed for wheezing or shortness of breath. 8 g 2   amLODipine  (NORVASC ) 5 MG tablet TAKE 1 TABLET BY MOUTH DAILY 100 tablet 2   aspirin  EC 81 MG EC tablet Take 1 tablet (81 mg total) by mouth daily. Swallow whole. 30 tablet 11   ondansetron  (ZOFRAN -ODT) 4 MG disintegrating tablet Take 1 tablet (4 mg total) by mouth every 8 (eight) hours as needed for nausea or vomiting. 20 tablet 0   SUMAtriptan  (IMITREX ) 25 MG tablet TAKE 1 TABLET BY MOUTH AT ONSET  OF HEADACHE, MAY REPEAT IN 2  HOURS IF NO IMPROVEMENT 10 tablet 0   valACYclovir  (VALTREX ) 500 MG tablet TAKE 1 TABLET BY MOUTH DAILY 100 tablet 2   omeprazole  (PRILOSEC) 20 MG capsule Take 20 mg by mouth daily. (Patient not taking: Reported on 10/20/2023)     predniSONE  (DELTASONE ) 20 MG tablet Take two tablets once daily for five days 10 tablet 0   No facility-administered medications prior to visit.     Per HPI unless specifically indicated in ROS section below Review of Systems  Objective:  BP 110/88  Pulse 74   Temp 98.2 F (36.8 C) (Oral)   Ht 5' 7 (1.702 m)   Wt 125 lb (56.7 kg)   SpO2 95%   BMI 19.58 kg/m   Wt Readings from Last 3 Encounters:  10/20/23 125 lb (56.7 kg)  03/18/23 123 lb 6.4 oz (56 kg)  01/26/23 123 lb (55.8 kg)          Physical Exam Vitals and nursing note reviewed.  Constitutional:      Appearance: Normal appearance. She is not ill-appearing.  HENT:     Head: Normocephalic and atraumatic.     Mouth/Throat:     Mouth: Mucous membranes are moist.     Pharynx: Oropharynx is clear. No oropharyngeal exudate or posterior oropharyngeal erythema.  Eyes:     Extraocular Movements:  Extraocular movements intact.     Pupils: Pupils are equal, round, and reactive to light.  Cardiovascular:     Rate and Rhythm: Normal rate and regular rhythm.     Pulses: Normal pulses.     Heart sounds: Normal heart sounds. No murmur heard. Pulmonary:     Effort: Pulmonary effort is normal. No respiratory distress.     Breath sounds: Normal breath sounds. No wheezing, rhonchi or rales.  Abdominal:     General: Abdomen is flat. Bowel sounds are normal. There is no distension.     Palpations: Abdomen is soft. There is no mass.     Tenderness: There is abdominal tenderness (mild) in the right lower quadrant, epigastric area and suprapubic area. There is no right CVA tenderness, left CVA tenderness, guarding or rebound. Negative signs include Murphy's sign.     Hernia: No hernia is present.  Musculoskeletal:     Right lower leg: No edema.     Left lower leg: No edema.  Skin:    General: Skin is warm and dry.     Findings: No rash.  Neurological:     Mental Status: She is alert.  Psychiatric:        Mood and Affect: Mood normal.        Behavior: Behavior normal.             Results for orders placed or performed in visit on 10/20/23  POCT urinalysis dipstick   Collection Time: 10/20/23  2:53 PM  Result Value Ref Range   Color, UA yellow    Clarity, UA clear    Glucose, UA Negative Negative   Bilirubin, UA negative    Ketones, UA negative    Spec Grav, UA 1.010 1.010 - 1.025   Blood, UA 3+    pH, UA 6.0 5.0 - 8.0   Protein, UA Negative Negative   Urobilinogen, UA 0.2 0.2 or 1.0 E.U./dL   Nitrite, UA negative    Leukocytes, UA Moderate (2+) (A) Negative   Appearance     Odor    Micro: WBC 20+ RBC 2-3 Bact 1+ rods Casts none Epi few  UCx sent   Lab Results  Component Value Date   NA 143 03/18/2023   CL 106 03/18/2023   K 4.5 03/18/2023   CO2 31 03/18/2023   BUN 17 03/18/2023   CREATININE 1.10 (H) 03/18/2023   GFR 58.56 (L) 03/18/2023   CALCIUM  9.6  03/18/2023   ALBUMIN 4.1 03/18/2023   GLUCOSE 99 03/18/2023    Lab Results  Component Value Date   WBC 6.6 03/18/2023   HGB 14.8 03/18/2023   HCT 43.6 03/18/2023   MCV 88.9  03/18/2023   PLT 216.0 03/18/2023     Assessment & Plan:      Urinary tract infection Acute UTI with dysuria and bacteriuria. Increased risk due to post-stroke bladder incontinence. - Prescribed Keflex  500 mg BID for 7 days. - Sent urine culture for sensitivity. - Advised avoiding bladder irritants. - [facility] [place]  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 Ongoing abdominal discomfort attributed by patient to scar tissue.   Left-sided hemiparesis due to prior stroke (2021) Chronic left-sided hemiparesis post-stroke.      Problem List Items Addressed This Visit     History of nephrectomy   Stage 3a chronic kidney disease (HCC)   Hemiparesis affecting left side as late effect of cerebrovascular accident (CVA) (HCC)   Acute cystitis with hematuria - Primary   Story/exam suspicious for UTI.  Sulfa allergy, previously did not tolerate macrobid. Rx keflex  500mg  bid 1 wk course.  Update if ongoing or worsening symptoms. Reviewed avoiding bladder irritants. Offered, declines pyridium Rx.       Other Visit Diagnoses       Dysuria       Relevant Orders   POCT urinalysis dipstick (Completed)   Urine Culture        Meds ordered this encounter  Medications   cephALEXin  (KEFLEX ) 500 MG capsule    Sig: Take 1 capsule (500 mg total) by mouth 2 (two) times daily.    Dispense:  14 capsule    Refill:  0    Orders Placed This Encounter  Procedures   Urine Culture   POCT urinalysis dipstick    Patient Instructions  Flu shot today  Limit bladder irritants like dark sodas, spicy foods, caffeine.  Drink plenty of water.  May use tylenol  for discomfort.   VISIT SUMMARY: Today, you came in  with symptoms of a possible urinary tract infection (UTI), including burning and pain during urination. 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 We discussed your symptoms and medical history in detail.  YOUR PLAN: -URINARY TRACT INFECTION (UTI): A UTI is an infection in any part of your urinary system. You have been prescribed Keflex  500 mg to take twice a day for 7 days. We have also sent your urine for a culture test to determine the best antibiotic for you. Please avoid bladder irritants like caffeine and alcohol. [facility] [place].  -CHRONIC KIDNEY DISEASE (CKD): CKD means your kidneys are not working as well as they should. Since you donated a kidney, we need to be careful with medications.   -LEFT-SIDED HEMIPARESIS including facial weakness: Hemiparesis is weakness on one side of the body, which in your case is due to a stroke you had in 2021. This condition is being managed as part of your ongoing care.  INSTRUCTIONS: Please follow up if your symptoms do not improve or if they worsen. Make sure to complete the full course of antibiotics. If you have any new symptoms or concerns, contact our office.  Follow up plan: Return if symptoms worsen or fail to improve.  Anton Blas, MD

## 2023-10-20 NOTE — Telephone Encounter (Signed)
 Spoke with pt and advised her that she would need an appointment in order to do a urine test. Pt has been scheduled this afternoon with Dr. KANDICE.

## 2023-10-21 ENCOUNTER — Ambulatory Visit: Admitting: Family

## 2023-10-22 ENCOUNTER — Other Ambulatory Visit: Payer: Self-pay

## 2023-10-22 DIAGNOSIS — E236 Other disorders of pituitary gland: Secondary | ICD-10-CM

## 2023-10-23 LAB — URINE CULTURE
MICRO NUMBER:: 17096972
SPECIMEN QUALITY:: ADEQUATE

## 2023-10-27 ENCOUNTER — Ambulatory Visit: Payer: Self-pay | Admitting: Family Medicine

## 2023-10-27 DIAGNOSIS — N3001 Acute cystitis with hematuria: Secondary | ICD-10-CM

## 2023-10-29 ENCOUNTER — Ambulatory Visit: Payer: Self-pay

## 2023-10-29 NOTE — Telephone Encounter (Signed)
 FYI Only or Action Required?: FYI only for provider.  Patient was last seen in primary care on 10/20/2023 by Rilla Baller, MD.  Called Nurse Triage reporting Recurrent UTI.  Symptoms began a week ago.  Interventions attempted: Prescription medications: Keflex .  Symptoms are: gradually improving.  Triage Disposition: See Physician Within 24 Hours  Patient/caregiver understands and will follow disposition?:  Copied from CRM [id number]. Topic: Clinical - Medication Question >> Oct 29, 2023  4:36 PM [name] wrote: Reason for CRM: Uti medication cephALEXin  (KEFLEX ) 500 MG capsule  not working; pt is requesting for a new medication. Please advise [phone] Reason for Disposition  [1] Taking antibiotic > 72 hours (3 days) for UTI AND [2] painful urination or frequency is SAME (unchanged, not better)  Answer Assessment - Initial Assessment Questions Patient completed course of Keflex  for UTI. States symptoms have improved mildly but still having urgency, frequency, burning/ sharp pain especially when finished voiding. Macrobid and Cipro have cleared infections in the past. Sensitive to Abx. Only one kidney(R) and she did get a pain there after finishing abx but has resolved. Reports never getting fevers, she is always colder.    Having some leg cramping with it.Trying to drink a ton but hates the taste of water. Electrolyte water not helping but pickle juice did help.  Hx of stroke and incontinence. Changing her incontinence pads 7-8 times day. Appt with Dr Bennett re: her leg cramping. Please advise on additional course of abx or alternate or if should wait for Dr Bennett appt 10/24 at 320pm  1. MAIN SYMPTOM: What is the main symptom you are concerned about? (e.g., painful urination, urine frequency)     Frequency, urgency, dysuria, sharp pain when finished urinating  2. BETTER-SAME-WORSE: Are you getting better, staying the same, or getting worse compared to how you felt at your last visit to  the doctor (most recent medical visit)?     Mildly better 3. PAIN: How bad is the pain?  (e.g., Scale 1-10; mild, moderate, or severe)     Sharp pain-  4. FEVER: Do you have a fever? If Yes, ask: What is it, how was it measured, and when did it start?     denies 5. OTHER SYMPTOMS: Do you have any other symptoms? (e.g., blood in the urine, flank pain, vaginal discharge)     Back pain and cramping in her toes (drank pickle juice and stopped) 6. DIAGNOSIS: When was the UTI diagnosed? By whom? Was it a kidney infection, bladder infection or both?     Started abx 10/14 7. ANTIBIOTIC: What antibiotic(s) are you taking? How many times per day?     Keflex  8. ANTIBIOTIC - START DATE: When did you start taking the antibiotic?     10/14  Protocols used: Urinary Tract Infection on Antibiotic Follow-up Call - [facility]

## 2023-10-30 ENCOUNTER — Ambulatory Visit (INDEPENDENT_AMBULATORY_CARE_PROVIDER_SITE_OTHER)

## 2023-10-30 VITALS — BP 130/88 | HR 102 | Temp 97.5°F | Ht 67.0 in | Wt 125.0 lb

## 2023-10-30 DIAGNOSIS — N952 Postmenopausal atrophic vaginitis: Secondary | ICD-10-CM

## 2023-10-30 DIAGNOSIS — R252 Cramp and spasm: Secondary | ICD-10-CM

## 2023-10-30 DIAGNOSIS — R300 Dysuria: Secondary | ICD-10-CM | POA: Diagnosis not present

## 2023-10-30 LAB — POC URINALSYSI DIPSTICK (AUTOMATED)
Bilirubin, UA: NEGATIVE
Blood, UA: NEGATIVE
Glucose, UA: NEGATIVE
Ketones, UA: NEGATIVE
Leukocytes, UA: NEGATIVE
Nitrite, UA: NEGATIVE
Protein, UA: NEGATIVE
Spec Grav, UA: 1.015 (ref 1.010–1.025)
Urobilinogen, UA: 0.2 U/dL
pH, UA: 6 (ref 5.0–8.0)

## 2023-10-30 MED ORDER — ESTROGENS CONJUGATED 0.625 MG/GM VA CREA: 1.0000 | TOPICAL_CREAM | Freq: Every day | VAGINAL | 12 refills | Status: DC

## 2023-10-30 NOTE — Patient Instructions (Addendum)
 Thank you for visiting [facility] today! Here's what we talked about: - Stretch toes with your hands and hold for 20 secs as long as needed - Call clinic if: Burning urination not improving, urinary frequency - ED if: fever, chills, worsening pain flank pain, nausea, feel confused - Ask your gynecologist about vaginal estrogen cream

## 2023-10-30 NOTE — Progress Notes (Signed)
 Subjective:   This visit was conducted in person. The patient gave informed consent to the use of Abridge AI technology to record the contents of the encounter as documented below.   Patient ID: [name], female    DOB: 1955-02-05, 68 y.o.   MRN: [id number]   Discussed the use of AI scribe software for clinical note transcription with the patient, who gave verbal consent to proceed.  History of Present Illness [name] is a 68 year old female with a history of stroke who presents with urinary symptoms and leg cramping.  She experiences urinary symptoms including dysuria, urgency, and incontinence. The dysuria occurs post-micturition and radiates to her abdomen. She has been using pads for incontinence since her stroke three years ago and associates urgency with urinary tract infections and incontinence with her stroke. She frequently changes her pad and has been using Depends for three years. No fever is present, but she always feels cold. She has a history of frequent urinary tract infections and has been treated with various antibiotics, including Microbid and Cipro. She recently completed a seven-day course of antibiotics for a urinary tract infection, confirmed by culture. She denies vaginal dryness but reports a persistent yellow discharge and odor, for which she was given Monistat by her gynecologist.  She experiences leg cramping, particularly in her toes, which curl inward. Relief is found by drinking pickle juice. The cramping began after she started drinking a pH hydration drink with electrolytes, which she has since discontinued. She also has difficulty walking due to the effects of her stroke on her left side.  She has back pain that started three days ago, which she attributes to lack of sleep. She sleeps about five hours a night, going to bed at 6 AM. The pain is located in the lower back. She only has her right kidney after donating her left kidney to her  daughter.    Review of Systems  All other systems reviewed and are negative.       Allergies  Allergen Reactions   Celebrex  [Celecoxib ] Anaphylaxis and Hives   Sulfonamide Derivatives Anaphylaxis   Ace Inhibitors Cough   Azithromycin  Nausea Only   Dilaudid  [Hydromorphone ] Nausea And Vomiting    Severe N/V   Erythromycin Nausea And Vomiting   Gabapentin      Patient has a sulfur allergy   Lactose Intolerance (Gi) Diarrhea    cramping   Prozac  [Fluoxetine ] Other (See Comments)    headaches   Sudafed [Pseudoephedrine Hcl]     Makes high   Losartan  Palpitations   Mucinex  [Guaifenesin  Er] Palpitations    Current Outpatient Medications on File Prior to Visit  Medication Sig Dispense Refill   albuterol  (VENTOLIN  HFA) 108 (90 Base) MCG/ACT inhaler Inhale 2 puffs into the lungs every 6 (six) hours as needed for wheezing or shortness of breath. 8 g 2   amLODipine  (NORVASC ) 5 MG tablet TAKE 1 TABLET BY MOUTH DAILY 100 tablet 2   aspirin  EC 81 MG EC tablet Take 1 tablet (81 mg total) by mouth daily. Swallow whole. 30 tablet 11   omeprazole  (PRILOSEC) 20 MG capsule Take 20 mg by mouth daily.     ondansetron  (ZOFRAN -ODT) 4 MG disintegrating tablet Take 1 tablet (4 mg total) by mouth every 8 (eight) hours as needed for nausea or vomiting. 20 tablet 0   SUMAtriptan  (IMITREX ) 25 MG tablet TAKE 1 TABLET BY MOUTH AT ONSET  OF HEADACHE, MAY REPEAT IN 2  HOURS IF  NO IMPROVEMENT 10 tablet 0   valACYclovir  (VALTREX ) 500 MG tablet TAKE 1 TABLET BY MOUTH DAILY 100 tablet 2   No current facility-administered medications on file prior to visit.    BP 130/88 (BP Location: Left Arm, Patient Position: Sitting, Cuff Size: Normal)   Pulse (!) 102   Temp (!) 97.5 F (36.4 C) (Oral)   Ht 5' 7 (1.702 m)   Wt 125 lb (56.7 kg)   SpO2 96%   BMI 19.58 kg/m   Objective:      Physical Exam GENERAL: Alert, cooperative, well developed, no acute distress. HEAD: Normocephalic atraumatic. EYES:  Extraocular movements intact bilaterally, conjunctivae normal bilaterally. ABDOMEN: Soft, mild tenderness in the periumbilical region and on percussion of the right flank, non distended, without organomegaly, normal bowel sounds. EXTREMITIES: No cyanosis or edema. NEUROLOGICAL: Oriented to person, place and time, no gait abnormalities, moves all extremities without gross motor or sensory deficit.       Assessment & Plan:   Assessment & Plan 1. Post-menopausal atrophic vaginitis (Primary) 2. Dysuria Persistent dysuria with recent UTI treated with antibiotics.  Was seen here in the clinic on 10/14 and urine culture confirmed pansensitive E. coli, patient was treated with a 7-day course of Keflex .  UA today in the clinic is negative for nitrites and negative for leukocytes, mild tenderness on right flank percussion less likely to be true CVA tenderness, may be related to chronic back pain.  Low clinical suspicion for pyelonephritis at this time given lack of systemic symptoms and clean urine. 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  Counseled patient that vaginal estrogen cream could help to alleviate this, counseled that although it is topical, there is some degree of systemic absorption. These creams often do contain small amounts of sulfa as additive, patient has had anaphylaxis in response to sulfa containing products and declines to try the cream at this time. Further offered patient a 3-day course of Pyridium for symptomatic relief of the dysuria, she further declines, preferring instead to drink cranberry juice.  Extensively counseled patient about the risks of leaving her symptoms untreated which could include worsening of her clinical condition, patient verbalized understanding. Patient provided with warning symptoms that would prompt return to the clinic as well as warning symptoms that would warrant prompt ED evaluation.  Patient verbalized  understanding  - Urinalysis, Routine w reflex microscopic - Urine Culture - POCT Urinalysis Dipstick (Automated)    Cramp of toe Intermittent cramps, primarily affecting the toes. Symptoms relieved by pickle juice.  Counseled patient to treat conservatively with stretching toes manually in the opposite direction of the cramp and holding for 20 seconds, repeat as many times as needed until cramp resolves.  - Continue using pickle juice for cramp relief. - Advise stretching exercises for toes during cramps.     Return for worsening of symptoms or failure to improve.   Liandro Thelin K Jaquilla Woodroof, MD  10/30/23     Contains text generated by Abridge.

## 2023-10-30 NOTE — Telephone Encounter (Signed)
 NOTED

## 2023-10-31 LAB — URINE CULTURE
MICRO NUMBER:: 17144693
Result:: NO GROWTH
SPECIMEN QUALITY:: ADEQUATE

## 2023-10-31 LAB — URINALYSIS, ROUTINE W REFLEX MICROSCOPIC
Bilirubin Urine: NEGATIVE
Glucose, UA: NEGATIVE
Hgb urine dipstick: NEGATIVE
Ketones, ur: NEGATIVE
Leukocytes,Ua: NEGATIVE
Nitrite: NEGATIVE
Protein, ur: NEGATIVE
Specific Gravity, Urine: 1.015 (ref 1.001–1.035)
pH: 6 (ref 5.0–8.0)

## 2023-11-02 ENCOUNTER — Ambulatory Visit: Payer: Self-pay

## 2023-11-02 ENCOUNTER — Ambulatory Visit
Admission: RE | Admit: 2023-11-02 | Discharge: 2023-11-02 | Disposition: A | Discharge status: Home / Self Care | Source: Ambulatory Visit

## 2023-11-02 DIAGNOSIS — E236 Other disorders of pituitary gland: Secondary | ICD-10-CM | POA: Insufficient documentation

## 2023-11-02 DIAGNOSIS — M75102 Unspecified rotator cuff tear or rupture of left shoulder, not specified as traumatic: Secondary | ICD-10-CM | POA: Diagnosis not present

## 2023-11-02 DIAGNOSIS — M19012 Primary osteoarthritis, left shoulder: Secondary | ICD-10-CM | POA: Diagnosis not present

## 2023-11-02 DIAGNOSIS — M25512 Pain in left shoulder: Secondary | ICD-10-CM | POA: Diagnosis not present

## 2023-11-02 DIAGNOSIS — G8929 Other chronic pain: Secondary | ICD-10-CM | POA: Diagnosis not present

## 2023-11-02 DIAGNOSIS — M7542 Impingement syndrome of left shoulder: Secondary | ICD-10-CM | POA: Diagnosis not present

## 2023-11-02 DIAGNOSIS — M7502 Adhesive capsulitis of left shoulder: Secondary | ICD-10-CM | POA: Diagnosis not present

## 2023-11-02 MED ORDER — GADOBUTROL 1 MMOL/ML IV SOLN
5.0000 mL | Freq: Once | INTRAVENOUS | Status: AC | PRN
Start: 2023-11-02 — End: 2023-11-02
  Administered 2023-11-02: 5 mL via INTRAVENOUS

## 2023-11-17 ENCOUNTER — Ambulatory Visit: Payer: Self-pay

## 2023-11-17 NOTE — Telephone Encounter (Signed)
 NOTED Thank you for seeing her

## 2023-11-17 NOTE — Telephone Encounter (Addendum)
 FYI Only or Action Required?: FYI only for provider: appointment scheduled on 11/19/2023.  Patient was last seen in primary care on 10/30/2023 by Bennett Reuben POUR, MD.  Called Nurse Triage reporting Headache.  Symptoms began x 3 days ago.  Interventions attempted: Rest, hydration, or home remedies.  Symptoms are: gradually worsening.  Triage Disposition: See PCP When Office is Open (Within 3 Days)  Patient/caregiver understands and will follow disposition?: Yes   Copied from CRM #[id number]. Topic: Clinical - Red Word Triage >> Nov 17, 2023 12:01 PM Mia F wrote: Red Word that prompted transfer to Nurse Triage: Pt says she ate some outdated meat. She says she has been sick for 3 days. Pt says she is very nauseous, has chills, headaches, and overall does not feel well. Symptoms just not getting better. At night she says she gets very cold and her back hurts when she gets up. Reason for Disposition  [1] MILD-MODERATE headache AND [2] present > 3 days (72 hours) AND [3] no improvement after using Care Advice    Pt requested 1st available day at [facility] by any provider  Answer Assessment - Initial Assessment Questions 1. LOCATION: Where does it hurt?      Front of head 2. ONSET: When did the headache start? (e.g., minutes, hours, days)      X 3 days 3. PATTERN: Does the pain come and go, or has it been constant since it started?     constant 4. SEVERITY: How bad is the pain? and What does it keep you from doing?  (e.g., Scale 1-10; mild, moderate, or severe)     moderate 5. RECURRENT SYMPTOM: Have you ever had headaches before? If Yes, ask: When was the last time? and What happened that time?      Yes -take medication 6. CAUSE: What do you think is causing the headache?     Possible turkey meat 7. MIGRAINE: Have you been diagnosed with migraine headaches? If Yes, ask: Is this headache similar?      na 8. HEAD INJURY: Has there been any recent injury to your  head?      no 9. OTHER SYMPTOMS: Do you have any other symptoms? (e.g., fever, stiff neck, eye pain, sore throat, cold symptoms)     Nausea, chills, hot/cold flashes, headaches, cough every so often 10. PREGNANCY: Is there any chance you are pregnant? When was your last menstrual period?       na  Turkey meat - out of date, green, slimy turkey meat  Protocols used: Headache-A-AH

## 2023-11-19 ENCOUNTER — Ambulatory Visit: Payer: Self-pay | Admitting: Family Medicine

## 2023-11-19 ENCOUNTER — Ambulatory Visit: Admitting: Family Medicine

## 2023-11-19 VITALS — BP 118/88 | HR 99 | Temp 98.0°F | Ht 67.0 in | Wt 125.2 lb

## 2023-11-19 DIAGNOSIS — R110 Nausea: Secondary | ICD-10-CM

## 2023-11-19 LAB — CBC WITH DIFFERENTIAL/PLATELET
Basophils Absolute: 0 K/uL (ref 0.0–0.1)
Basophils Relative: 0.5 % (ref 0.0–3.0)
Eosinophils Absolute: 0.1 K/uL (ref 0.0–0.7)
Eosinophils Relative: 2.5 % (ref 0.0–5.0)
HCT: 41.6 % (ref 36.0–46.0)
Hemoglobin: 14.5 g/dL (ref 12.0–15.0)
Lymphocytes Relative: 28.9 % (ref 12.0–46.0)
Lymphs Abs: 1.1 K/uL (ref 0.7–4.0)
MCHC: 34.9 g/dL (ref 30.0–36.0)
MCV: 86.7 fl (ref 78.0–100.0)
Monocytes Absolute: 0.3 K/uL (ref 0.1–1.0)
Monocytes Relative: 6.9 % (ref 3.0–12.0)
Neutro Abs: 2.4 K/uL (ref 1.4–7.7)
Neutrophils Relative %: 61.2 % (ref 43.0–77.0)
Platelets: 188 K/uL (ref 150.0–400.0)
RBC: 4.8 Mil/uL (ref 3.87–5.11)
RDW: 12.8 % (ref 11.5–15.5)
WBC: 4 K/uL (ref 4.0–10.5)

## 2023-11-19 LAB — COMPREHENSIVE METABOLIC PANEL WITH GFR
ALT: 13 U/L (ref 0–35)
AST: 21 U/L (ref 0–37)
Albumin: 4 g/dL (ref 3.5–5.2)
Alkaline Phosphatase: 86 U/L (ref 39–117)
BUN: 10 mg/dL (ref 6–23)
CO2: 34 meq/L — ABNORMAL HIGH (ref 19–32)
Calcium: 9.2 mg/dL (ref 8.4–10.5)
Chloride: 105 meq/L (ref 96–112)
Creatinine, Ser: 1.08 mg/dL (ref 0.40–1.20)
GFR: 52.5 mL/min — ABNORMAL LOW (ref >60.00)
Glucose, Bld: 68 mg/dL — ABNORMAL LOW (ref 70–99)
Potassium: 4.1 meq/L (ref 3.5–5.1)
Sodium: 144 meq/L (ref 135–145)
Total Bilirubin: 0.4 mg/dL (ref 0.2–1.2)
Total Protein: 6.6 g/dL (ref 6.0–8.3)

## 2023-11-19 LAB — LIPASE: Lipase: 61 U/L — ABNORMAL HIGH (ref 11.0–59.0)

## 2023-11-19 NOTE — Assessment & Plan Note (Signed)
 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 She did states that she had been doing very well until November 9.  She did not realize the turkey she ate for 3 days was bad. She has had no vomiting or diarrhea.  She is nauseous after eating.  Will evaluate with labs.  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 Will check c-Met, CBC and lipase.  Recommend symptomatic and supportive care.  Recommended avoidance of NSAIDs. Can use Zofran  as needed.  Encouraged bland diet. If symptoms from food toxin or bacteria such as Salmonella should resolve over time.  Patient will call if she has diarrhea so that we can consider stool testing.

## 2023-11-19 NOTE — Progress Notes (Signed)
 .   Patient ID: [name], female    DOB: 12-08-1955, 68 y.o.   MRN: [id number]  This visit was conducted in person.  BP 118/88   Pulse 99   Temp 98 F (36.7 C) (Oral)   Ht 5' 7 (1.702 m)   Wt 125 lb 3.2 oz (56.8 kg)   SpO2 94%   BMI 19.61 kg/m    CC:  Chief Complaint  Patient presents with   Nausea    Nausea no vomiting. Sons states the food she ate was out of date and it was slimy and green. This was on Sunday, Monday when she woke up she felt sick. Still feels sick. Aberogast states it helps a lot.     Subjective:   HPI: [name] is a 68 y.o. female presenting on 11/19/2023 for Nausea (Nausea no vomiting. Sons states the food she ate was out of date and it was slimy and green. This was on Sunday, Monday when she woke up she felt sick. Still feels sick. Aberogast states it helps a lot. )  PCP [name], FNP  Patient reports eating out of date food on November 9.. turkey .SABRA She did not note this but son felt it was slimy and green... ate this for three days The following day she had nausea after eating.. no emesis.  Yesterday had diarrhea after eating soup.  No abdominal pain.  Some mid back pain on left ( does not have a kidney there)  No fever.   No urinary symptoms. No dysuria.   Took  zofran  off and on... helps temporarily.  Has history of  chronic nausea... followed by Dr. Rosalie Hx of erosive gastropathy  Started on  omeprazole  but felt this worsened symptoms.   Tylenol  not helping with back pain.SABRA ibuprofen one tablet. Stopped the back pain  Iberogast helps with gas pain.    Relevant past medical, surgical, family and social history reviewed and updated as indicated. Interim medical history since our last visit reviewed. Allergies and medications reviewed and updated. Outpatient Medications Prior to Visit  Medication Sig Dispense Refill   albuterol  (VENTOLIN  HFA) 108 (90 Base) MCG/ACT inhaler Inhale 2 puffs into the lungs every 6 (six)  hours as needed for wheezing or shortness of breath. 8 g 2   amLODipine  (NORVASC ) 5 MG tablet TAKE 1 TABLET BY MOUTH DAILY 100 tablet 2   aspirin  EC 81 MG EC tablet Take 1 tablet (81 mg total) by mouth daily. Swallow whole. 30 tablet 11   omeprazole  (PRILOSEC) 20 MG capsule Take 20 mg by mouth daily.     ondansetron  (ZOFRAN -ODT) 4 MG disintegrating tablet Take 1 tablet (4 mg total) by mouth every 8 (eight) hours as needed for nausea or vomiting. 20 tablet 0   SUMAtriptan  (IMITREX ) 25 MG tablet TAKE 1 TABLET BY MOUTH AT ONSET  OF HEADACHE, MAY REPEAT IN 2  HOURS IF NO IMPROVEMENT 10 tablet 0   valACYclovir  (VALTREX ) 500 MG tablet TAKE 1 TABLET BY MOUTH DAILY 100 tablet 2   No facility-administered medications prior to visit.     Per HPI unless specifically indicated in ROS section below Review of Systems  Constitutional:  Negative for fatigue and fever.  HENT:  Negative for congestion.   Eyes:  Negative for pain.  Respiratory:  Negative for cough and shortness of breath.   Cardiovascular:  Negative for chest pain, palpitations and leg swelling.  Gastrointestinal:  Negative for abdominal pain.  Genitourinary:  Negative  for dysuria and vaginal bleeding.  Musculoskeletal:  Negative for back pain.  Neurological:  Negative for syncope, light-headedness and headaches.  Psychiatric/Behavioral:  Negative for dysphoric mood.    Objective:  BP 118/88   Pulse 99   Temp 98 F (36.7 C) (Oral)   Ht 5' 7 (1.702 m)   Wt 125 lb 3.2 oz (56.8 kg)   SpO2 94%   BMI 19.61 kg/m   Wt Readings from Last 3 Encounters:  11/19/23 125 lb 3.2 oz (56.8 kg)  10/30/23 125 lb (56.7 kg)  10/20/23 125 lb (56.7 kg)      Physical Exam Constitutional:      General: She is not in acute distress.    Appearance: Normal appearance. She is well-developed. She is not ill-appearing or toxic-appearing.  HENT:     Head: Normocephalic.     Right Ear: Hearing, tympanic membrane, ear canal and external ear normal.  Tympanic membrane is not erythematous, retracted or bulging.     Left Ear: Hearing, tympanic membrane, ear canal and external ear normal. Tympanic membrane is not erythematous, retracted or bulging.     Nose: No mucosal edema or rhinorrhea.     Right Sinus: No maxillary sinus tenderness or frontal sinus tenderness.     Left Sinus: No maxillary sinus tenderness or frontal sinus tenderness.     Mouth/Throat:     Pharynx: Uvula midline.  Eyes:     General: Lids are normal. Lids are everted, no foreign bodies appreciated.     Conjunctiva/sclera: Conjunctivae normal.     Pupils: Pupils are equal, round, and reactive to light.  Neck:     Thyroid : No thyroid  mass or thyromegaly.     Vascular: No carotid bruit.     Trachea: Trachea normal.  Cardiovascular:     Rate and Rhythm: Normal rate and regular rhythm.     Pulses: Normal pulses.     Heart sounds: Normal heart sounds, S1 normal and S2 normal. No murmur heard.    No friction rub. No gallop.  Pulmonary:     Effort: Pulmonary effort is normal. No tachypnea or respiratory distress.     Breath sounds: Normal breath sounds. No decreased breath sounds, wheezing, rhonchi or rales.  Abdominal:     General: Bowel sounds are normal.     Palpations: Abdomen is soft.     Tenderness: There is abdominal tenderness.     Comments: Patient with diffuse abdominal tenderness and back tenderness to light touch, she states this is usual/baseline for her but does not have abdominal pain until someone starts pushing on her abdomen.  Musculoskeletal:     Cervical back: Normal range of motion and neck supple.  Skin:    General: Skin is warm and dry.     Findings: No rash.  Neurological:     Mental Status: She is alert.  Psychiatric:        Mood and Affect: Mood is not anxious or depressed.        Speech: Speech normal.        Behavior: Behavior normal. Behavior is cooperative.        Thought Content: Thought content normal.        Judgment: Judgment  normal.       Results for orders placed or performed in visit on 10/30/23  Urine Culture   Collection Time: 10/30/23  4:09 PM   Specimen: Urine  Result Value Ref Range   MICRO NUMBER: [id number]    SPECIMEN  QUALITY: Adequate    Sample Source URINE    STATUS: FINAL    Result: No Growth   Urinalysis, Routine w reflex microscopic   Collection Time: 10/30/23  4:09 PM  Result Value Ref Range   Color, Urine YELLOW YELLOW   APPearance CLEAR CLEAR   Specific Gravity, Urine 1.015 1.001 - 1.035   pH 6.0 5.0 - 8.0   Glucose, UA NEGATIVE NEGATIVE   Bilirubin Urine NEGATIVE NEGATIVE   Ketones, ur NEGATIVE NEGATIVE   Hgb urine dipstick NEGATIVE NEGATIVE   Protein, ur NEGATIVE NEGATIVE   Nitrite NEGATIVE NEGATIVE   Leukocytes,Ua NEGATIVE NEGATIVE  POCT Urinalysis Dipstick (Automated)   Collection Time: 10/30/23  4:15 PM  Result Value Ref Range   Color, UA straw    Clarity, UA clear    Glucose, UA Negative Negative   Bilirubin, UA negative    Ketones, UA negative    Spec Grav, UA 1.015 1.010 - 1.025   Blood, UA negative    pH, UA 6.0 5.0 - 8.0   Protein, UA Negative Negative   Urobilinogen, UA 0.2 0.2 or 1.0 E.U./dL   Nitrite, UA negative    Leukocytes, UA Negative Negative    Assessment and Plan  Nausea Assessment & Plan: Acute, 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 She did states that she had been doing very well until November 9.  She did not realize the turkey she ate for 3 days was bad. She has had no vomiting or diarrhea.  She is nauseous after eating.  Will evaluate with labs.  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 Will check c-Met, CBC and lipase.  Recommend symptomatic and supportive care.  Recommended avoidance of NSAIDs. Can use Zofran  as needed.  Encouraged bland diet. If symptoms from food toxin or bacteria such as  Salmonella should resolve over time.  Patient will call if she has diarrhea so that we can consider stool testing.   Orders: -     CBC with Differential/Platelet -     Lipase -     Comprehensive metabolic panel with GFR    No follow-ups on file.   Greig Ring, MD

## 2023-11-23 NOTE — Telephone Encounter (Signed)
 Spoke with pt and I have gone over her results with her per Dr. Sherrel message. Pt wanted to know what she needed to do about the elevated lipase. She is still doing a bland diet and resting and her symptoms aren't improving. Pt would like Dr. Sherrel recommendations. Advised her that Dr. Avelina was not in the office today and would address the message tomorrow. Pt is okay with waiting.

## 2023-11-23 NOTE — Telephone Encounter (Signed)
 Copied from CRM [id number]. Topic: Clinical - Lab/Test Results >> Nov 23, 2023  2:15 PM Pinkey ORN wrote: Reason for CRM: Lab Results >> Nov 23, 2023  2:16 PM Pinkey ORN wrote: Patient is requesting a call back to discuss lab results.

## 2023-11-24 NOTE — Telephone Encounter (Unsigned)
 Copied from CRM [id number]. Topic: General - Call Back - No Documentation >> Nov 24, 2023  9:38 AM Alfonso ORN wrote: Reason for CRM: pt called back to return call. Wasn't able to receive whole message because pt had to get off the phone due to her being sick to her stomach. Relayed message regarding Zofran  for Nausea she stated that it doesn't work and she will call back

## 2023-12-02 ENCOUNTER — Ambulatory Visit: Payer: Self-pay

## 2023-12-02 NOTE — Telephone Encounter (Signed)
 FYI Only or Action Required?: FYI only for provider: fyi.  Patient was last seen in primary care on 11/19/2023 by Avelina Greig BRAVO, MD.  Called Nurse Triage reporting Nausea.  Symptoms began n/a.  Interventions attempted: Other: n/a.  Symptoms are: n/a.  Triage Disposition: Information or Advice Only Call  Patient/caregiver understands and will follow disposition?: Yes Reason for Disposition  General information question, no triage required and triager able to answer question  Answer Assessment - Initial Assessment Questions 1. REASON FOR CALL: What is the main reason for your call? or How can I best help you?     Patient found mold in her coffee pot, got a new coffee pot and noticing improvement of symptoms.  2. SYMPTOMS : Do you have any symptoms?      Noticing improvement, still experiencing slight nausea  Protocols used: Information Only Call - No Triage-A-AH  Copied from CRM [id number]. Topic: Clinical - Medical Advice >> Dec 02, 2023  3:28 PM Nessti S wrote: Reason for CRM: pt called because she wanted to inform the nurse that it was mold in her coffee pot causing sickness and diarrhea not food poisoning. Daughter in law got her a new coffee pot. Call back number [phone]

## 2023-12-07 NOTE — Telephone Encounter (Signed)
 NOTED

## 2023-12-11 ENCOUNTER — Ambulatory Visit: Payer: Self-pay

## 2023-12-11 NOTE — Telephone Encounter (Signed)
 FYI Only or Action Required?: FYI only for provider: ED advised.  Patient was last seen in primary care on 11/19/2023 by Avelina Greig BRAVO, MD.  Called Nurse Triage reporting Fall.  Symptoms began yesterday.  Interventions attempted: Rest, hydration, or home remedies.  Symptoms are: unchanged.  Triage Disposition: Call EMS 911 Now  Patient/caregiver understands and will follow disposition?: Yes   Copied from CRM #[id number]. Topic: Clinical - Red Word Triage >> Dec 11, 2023  3:28 PM Ashley R wrote: Red Word that prompted transfer to Nurse Triage: two falls in the last 24 hours, most recently hit head. Occurring more frequently post stroke. Having problems with balance in general. Reason for Disposition  [1] SEVERE weakness (e.g., unable to walk or barely able to walk, requires support) AND [2] new-onset or getting worse  Answer Assessment - Initial Assessment Questions Additional info: Initially refused emergency evaluation I have already had a stroke, I am having balance issues. Discussed with patient concern she may have another stroke or other disease process that needs immediate evaluation, she said oh I didn't know that, ok I will have my son bring me. She is not planning on calling for ambulance, plans to have son drive her to ER.     1. MECHANISM: How did the fall happen?     Balance issue 2. DOMESTIC VIOLENCE AND ELDER ABUSE SCREENING: Did you fall because someone pushed you or tried to hurt you? If Yes, ask: Are you safe now?      3. ONSET: When did the fall happen? (e.g., minutes, hours, or days ago)     Yesterday X 2. Hit head on trash can.  4. LOCATION: What part of the body hit the ground? (e.g., back, buttocks, head, hips, knees, hands, head, stomach)     Back of head  5. INJURY: Did you hurt (injure) yourself when you fell? If Yes, ask: What did you injure? Tell me more about this? (e.g., body area; type of injury; pain severity)     Bruise on  buttocks.  6. PAIN: Is there any pain? If Yes, ask: How bad is the pain? (e.g., Scale 0-10; or none, mild,      Denies  7. SIZE: For cuts, bruises, or swelling, ask: How large is it? (e.g., inches or centimeters)     Bruise on buttocks 8. PREGNANCY: Is there any chance you are pregnant? When was your last menstrual period?      9. OTHER SYMPTOMS: Do you have any other symptoms? (e.g., dizziness, fever, weakness; new-onset or worsening).      Difficulty standing, feels off balance when she stands she goes backwards. Unable to stand without support.  10. CAUSE: What do you think caused the fall (or falling)? (e.g., dizzy spell, tripped)       Balance issue  Protocols used: Falls and [facility]

## 2023-12-11 NOTE — Telephone Encounter (Signed)
 Noted

## 2023-12-22 ENCOUNTER — Ambulatory Visit: Payer: Self-pay

## 2023-12-22 NOTE — Telephone Encounter (Signed)
 FYI Only or Action Required?: FYI only for provider: appointment scheduled on 12.17.25.  Patient was last seen in primary care on 11/19/2023 by [name] Greig BRAVO, MD.  Called Nurse Triage reporting Nausea.  Symptoms began about a month ago.  Interventions attempted: Prescription medications: nausea pills- when asked which medication.  Symptoms are: unchanged.  Triage Disposition: See PCP Within 2 Weeks, See PCP When Office is Open (Within 3 Days)  Patient/caregiver understands and will follow disposition?: Yes  Pt is requesting mold testing and allergy testing. Found out coffee pot/maker had mold in it. Unknown for how long and was drinking coffee daily.

## 2023-12-22 NOTE — Telephone Encounter (Signed)
 This RN made first attempt to contact pt with no answer. A voicemail was left with call back number provided.     Copied from CRM #[id number]. Topic: Clinical - Medical Advice >> Dec 21, 2023 11:43 AM Corin V wrote: Reason for CRM: Patient called and stated she wanted to speak to a nurse as what she was in the office for on her last visit hasn't gotten better. She did not want to provide details on symptoms or reason for last visit, but chart review indicates last visit was 11/19/23 for nausea. Please call patient back to discuss concerns and next steps needed Call back: [phone]

## 2023-12-22 NOTE — Telephone Encounter (Signed)
° °  Reason for Disposition  Nausea lasts > 1 week  Weakness is a chronic symptom (recurrent or ongoing AND present > 4 weeks)  Answer Assessment - Initial Assessment Questions Pt was seen in November and was diagnosed with food poisoning. She has found out that's not the case. She states that her son drank coffee at her house and he got sick. Well they started looking and the coffee pot/maker had mold in it. So they threw it away and got a new one but she can't drink coffee anymore. She is still having significant nausea. She states she is able to eat but after every meal she has nausea. No vomiting but sometimes spits up phelgm. She also states she has been off balance and fell last week but states she saw her neurologist and had MRI testing and no new diagnoses. She states the off balance is it not new and has been a symptom since she had her stroke. She states last week was first fall, witness by son, no LOC. Denies any injuries currently. She states her biggest concern is the nausea because she can't keep going like this. Pt denies any higher acuity questions. Pt is requesting testing for mold and also allergy testing.    1. NAUSEA SEVERITY: How bad is the nausea? (e.g., mild, moderate, severe; dehydration, weight loss)     moderate 2. ONSET: When did the nausea begin?     Over a month 3. VOMITING: Any vomiting? If Yes, ask: How many times today?     Denies states just phlegm 4. RECURRENT SYMPTOM: Have you had nausea before? If Yes, ask: When was the last time? What happened that time?     Yes it's been ongoing for a month and believes it's from mold 5. CAUSE: What do you think is causing the nausea?     mold  Answer Assessment - Initial Assessment Questions 1. MECHANISM: How did the fall happen?     Was walking and fell into a true 2. DOMESTIC VIOLENCE AND ELDER ABUSE SCREENING: Did you fall because someone pushed you or tried to hurt you? If Yes, ask: Are you safe  now?     no 3. ONSET: When did the fall happen? (e.g., minutes, hours, or days ago)     Last week 4. LOCATION: What part of the body hit the ground? (e.g., back, buttocks, head, hips, knees, hands, head, stomach)     States she bumped her head 5. INJURY: Did you hurt (injure) yourself when you fell? If Yes, ask: What did you injure? Tell me more about this? (e.g., body area; type of injury; pain severity)     Son was there, she states she had no injuries 6. PAIN: Is there any pain? If Yes, ask: How bad is the pain? (e.g., Scale 0-10; or none, mild,      none 9. OTHER SYMPTOMS: Do you have any other symptoms? (e.g., dizziness, fever, weakness; new-onset or worsening).      Nausea but has been on going since prior to fall  10. CAUSE: What do you think caused the fall (or falling)? (e.g., dizzy spell, tripped)       Pt states she is chronically unsteady  Protocols used: Nausea-A-AH, Falls and Falling-A-AH

## 2023-12-23 ENCOUNTER — Ambulatory Visit: Payer: Self-pay | Admitting: Family

## 2023-12-23 ENCOUNTER — Ambulatory Visit: Admitting: Family

## 2023-12-23 ENCOUNTER — Ambulatory Visit
Admission: RE | Admit: 2023-12-23 | Discharge: 2023-12-23 | Disposition: A | Discharge status: Home / Self Care | Source: Ambulatory Visit | Attending: Family | Admitting: Family

## 2023-12-23 VITALS — BP 120/84 | HR 66 | Temp 98.1°F | Ht 67.0 in | Wt 123.2 lb

## 2023-12-23 DIAGNOSIS — K219 Gastro-esophageal reflux disease without esophagitis: Secondary | ICD-10-CM

## 2023-12-23 DIAGNOSIS — R748 Abnormal levels of other serum enzymes: Secondary | ICD-10-CM | POA: Diagnosis not present

## 2023-12-23 DIAGNOSIS — R1084 Generalized abdominal pain: Secondary | ICD-10-CM | POA: Insufficient documentation

## 2023-12-23 DIAGNOSIS — R2681 Unsteadiness on feet: Secondary | ICD-10-CM | POA: Diagnosis not present

## 2023-12-23 DIAGNOSIS — E538 Deficiency of other specified B group vitamins: Secondary | ICD-10-CM | POA: Diagnosis not present

## 2023-12-23 LAB — CBC WITH DIFFERENTIAL/PLATELET
Basophils Absolute: 0.1 K/uL (ref 0.0–0.1)
Basophils Relative: 0.8 % (ref 0.0–3.0)
Eosinophils Absolute: 0.1 K/uL (ref 0.0–0.7)
Eosinophils Relative: 2.1 % (ref 0.0–5.0)
HCT: 43.7 % (ref 36.0–46.0)
Hemoglobin: 15 g/dL (ref 12.0–15.0)
Lymphocytes Relative: 24.9 % (ref 12.0–46.0)
Lymphs Abs: 1.6 K/uL (ref 0.7–4.0)
MCHC: 34.3 g/dL (ref 30.0–36.0)
MCV: 87.6 fl (ref 78.0–100.0)
Monocytes Absolute: 0.4 K/uL (ref 0.1–1.0)
Monocytes Relative: 6.7 % (ref 3.0–12.0)
Neutro Abs: 4.2 K/uL (ref 1.4–7.7)
Neutrophils Relative %: 65.5 % (ref 43.0–77.0)
Platelets: 221 K/uL (ref 150.0–400.0)
RBC: 4.99 Mil/uL (ref 3.87–5.11)
RDW: 12.9 % (ref 11.5–15.5)
WBC: 6.4 K/uL (ref 4.0–10.5)

## 2023-12-23 LAB — COMPREHENSIVE METABOLIC PANEL WITH GFR
ALT: 9 U/L (ref 3–35)
AST: 16 U/L (ref 5–37)
Albumin: 4.2 g/dL (ref 3.5–5.2)
Alkaline Phosphatase: 79 U/L (ref 39–117)
BUN: 14 mg/dL (ref 6–23)
CO2: 33 meq/L — ABNORMAL HIGH (ref 19–32)
Calcium: 9.5 mg/dL (ref 8.4–10.5)
Chloride: 104 meq/L (ref 96–112)
Creatinine, Ser: 1.11 mg/dL (ref 0.40–1.20)
GFR: 50.77 mL/min — ABNORMAL LOW (ref >60.00)
Glucose, Bld: 96 mg/dL (ref 70–99)
Potassium: 4.1 meq/L (ref 3.5–5.1)
Sodium: 142 meq/L (ref 135–145)
Total Bilirubin: 0.6 mg/dL (ref 0.2–1.2)
Total Protein: 6.9 g/dL (ref 6.0–8.3)

## 2023-12-23 LAB — POCT URINE DIPSTICK
Bilirubin, UA: NEGATIVE
Blood, UA: NEGATIVE
Glucose, UA: NEGATIVE mg/dL
Ketones, POC UA: NEGATIVE mg/dL
Leukocytes, UA: NEGATIVE
Nitrite, UA: NEGATIVE
POC PROTEIN,UA: NEGATIVE
Spec Grav, UA: 1.025 (ref 1.010–1.025)
Urobilinogen, UA: 0.2 U/dL
pH, UA: 6 (ref 5.0–8.0)

## 2023-12-23 LAB — LIPASE: Lipase: 48 U/L (ref 11.0–59.0)

## 2023-12-23 LAB — VITAMIN B12: Vitamin B-12: 322 pg/mL (ref 211–911)

## 2023-12-23 MED ORDER — FAMOTIDINE 10 MG PO TABS: 10.0000 mg | ORAL_TABLET | Freq: Two times a day (BID) | ORAL | 2 refills | Status: AC

## 2023-12-23 MED ORDER — IOHEXOL 300 MG/ML  SOLN
80.0000 mL | Freq: Once | INTRAMUSCULAR | Status: AC | PRN
  Administered 2023-12-23: 13:00:00 80 mL via INTRAVENOUS

## 2023-12-23 MED ORDER — IOHEXOL 9 MG/ML PO SOLN: 500.0000 mL | ORAL | Status: AC

## 2023-12-23 MED ORDER — IOHEXOL 9 MG/ML PO SOLN
500.0000 mL | ORAL | Status: AC
  Administered 2023-12-23: 13:00:00 500 mL via ORAL

## 2023-12-23 NOTE — Addendum Note (Signed)
 Addended by: ALBINO SHAVER C on: 12/23/2023 10:05 AM   Modules accepted: Orders

## 2023-12-23 NOTE — Progress Notes (Signed)
 Established Patient Office Visit  Subjective:      CC:  Chief Complaint  Patient presents with   Nausea    HPI: [name] is a 68 y.o. female presenting on 12/23/2023 for Nausea .  Discussed the use of AI scribe software for clinical note transcription with the patient, who gave verbal consent to proceed.  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  She has experienced persistent nausea since consuming coffee from a moldy coffee pot discovered on November 13th. Initially attributing her symptoms to food poisoning, she later suspected mold exposure. Despite discontinuing coffee consumption, nausea persists. She also notes a funny taste in her mouth upon waking, for which she uses mouthwash. No diarrhea, but she reports constipation managed with Senokot. No black stools or abdominal pain except when consuming certain foods.  Her history of erosive gastropathy includes previous use of omeprazole , which she discontinued due to worsening symptoms. Currently, she takes Pepcid  AC once daily for acid reflux.  She experienced two falls on December 5th, one resulting in a fall into a Christmas tree. She did not seek emergency care but feels more unstable than usual, especially when standing for extended periods. No dizziness or blurry vision associated with these falls.  She reports increased headaches and sinus congestion and takes Allegra. No sinus pressure, pain, or tenderness, but frequent sneezing and a sore throat. These symptoms have been present since her mother's passing.  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 She mentions a creatinine level of 1.08 in November, which was back to normal. She is concerned about her kidney function due to her  medication use.         Social history:  Relevant past medical, surgical, family and social history reviewed and updated as indicated. Interim medical history since our last visit reviewed.  Allergies and medications reviewed and updated.  DATA REVIEWED: CHART IN EPIC     ROS: Negative unless specifically indicated above in HPI.   Current Medications[1]        Objective:        BP 120/84 (BP Location: Left Arm, Patient Position: Sitting, Cuff Size: Normal)   Pulse 66   Temp 98.1 F (36.7 C) (Temporal)   Ht 5' 7 (1.702 m)   Wt 123 lb 3.2 oz (55.9 kg)   SpO2 98%   BMI 19.30 kg/m   Physical Exam ABDOMEN: Tender to palpation with 10/10 pain.  Wt Readings from Last 3 Encounters:  12/23/23 123 lb 3.2 oz (55.9 kg)  11/19/23 125 lb 3.2 oz (56.8 kg)  10/30/23 125 lb (56.7 kg)    Physical Exam Vitals reviewed.  Constitutional:      General: She is not in acute distress.    Appearance: Normal appearance. She is normal weight. She is not ill-appearing, toxic-appearing or diaphoretic.  HENT:     Head: Normocephalic.     Nose: Rhinorrhea present.  Cardiovascular:     Rate and Rhythm: Normal rate and regular rhythm.  Pulmonary:     Effort: Pulmonary effort is normal.     Breath sounds: Normal breath sounds.  Abdominal:     Palpations: Abdomen is soft.     Tenderness: There is abdominal tenderness in the right upper quadrant, right lower quadrant, epigastric area,  left upper quadrant and left lower quadrant. There is guarding.     Hernia: No hernia is present.  Musculoskeletal:        General: Normal range of motion.  Neurological:     General: No focal deficit present.     Mental Status: She is alert and oriented to person, place, and time. Mental status is at baseline.     Cranial Nerves: Cranial nerves 2-12 are intact. No cranial nerve deficit or facial asymmetry.     Coordination: Coordination is intact.     Gait: Gait abnormal (unsteady).   Psychiatric:        Mood and Affect: Mood normal.        Behavior: Behavior normal.        Thought Content: Thought content normal.        Judgment: Judgment normal.     10/10 generalized abd pain      Results Labs Creatinine (11/2023): 1.08  Assessment & Plan:   Assessment and Plan Assessment & Plan Chronic abdominal pain with history of erosive gastropathy and gastroesophageal reflux disease Chronic abdominal pain with erosive gastropathy and GERD. Recent exacerbation with nausea and abdominal tenderness. Mold exposure suspected but unlikely cause. Omeprazole  worsened symptoms; Pepcid  AC used for nausea. No diarrhea or heartburn. Tenderness on examination, pain 10/10 on palpation. Previous imaging showed normal pancreas. Concerns about scar tissue from previous hysterectomy. - Ordered CT scan of the abdomen and pelvis to evaluate abdominal pain and tenderness. - Prescribed Pepcid  AC 10 mg twice daily to manage acid reflux and nausea. - Advised to follow up with gastroenterologist, Dr. Darol, for further evaluation of abdominal symptoms. -discussed red flag precautions  Unsteady gait and recent falls Recent falls with unsteady gait. No ER visit or head injury. Increased instability when standing for prolonged periods. No dizziness or blurry vision reported. No cane use due to difficulty with stairs. - Advised to avoid standing for prolonged periods without support. 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 Previous creatinine levels were normal. Concerns about kidney function with imaging and medication use. - Ordered CT scan with consideration of kidney function. - Advised to avoid magnesium-containing medications due to kidney concerns.        Return in about 2 weeks (around  01/06/2024).     [name], MSN, APRN, FNP-C [facility]        [1]  Current Outpatient Medications:    albuterol  (VENTOLIN  HFA) 108 (90 Base) MCG/ACT inhaler, Inhale 2 puffs into the lungs every 6 (six) hours as needed for wheezing or shortness of breath., Disp: 8 g, Rfl: 2   amLODipine  (NORVASC ) 5 MG tablet, TAKE 1 TABLET BY MOUTH DAILY, Disp: 100 tablet, Rfl: 2   aspirin  EC 81 MG EC tablet, Take 1 tablet (81 mg total) by mouth daily. Swallow whole., Disp: 30 tablet, Rfl: 11   famotidine  (PEPCID ) 10 MG tablet, Take 1 tablet (10 mg total) by mouth 2 (two) times daily., Disp: 60 tablet, Rfl: 2   omeprazole  (PRILOSEC) 20 MG capsule, Take 20 mg by mouth daily., Disp: , Rfl:    ondansetron  (ZOFRAN -ODT) 4 MG disintegrating tablet, Take 1 tablet (4 mg total) by mouth every 8 (eight) hours as needed for nausea or vomiting., Disp: 20 tablet, Rfl: 0   SUMAtriptan  (IMITREX ) 25 MG tablet, TAKE 1 TABLET BY  MOUTH AT ONSET  OF HEADACHE, MAY REPEAT IN 2  HOURS IF NO IMPROVEMENT, Disp: 10 tablet, Rfl: 0   valACYclovir  (VALTREX ) 500 MG tablet, TAKE 1 TABLET BY MOUTH DAILY, Disp: 100 tablet, Rfl: 2

## 2024-01-11 ENCOUNTER — Ambulatory Visit: Admitting: Family

## 2024-01-11 VITALS — BP 110/72 | HR 86 | Temp 98.4°F | Ht 67.0 in | Wt 123.4 lb

## 2024-01-11 DIAGNOSIS — D320 Benign neoplasm of cerebral meninges: Secondary | ICD-10-CM | POA: Diagnosis not present

## 2024-01-11 DIAGNOSIS — G9389 Other specified disorders of brain: Secondary | ICD-10-CM | POA: Insufficient documentation

## 2024-01-11 NOTE — Progress Notes (Signed)
 "  Established Patient Office Visit  Subjective:      CC:  Chief Complaint  Patient presents with   Follow-up    2 week follow up    HPI: [name] is a 69 y.o. female presenting on 01/11/2024 for Follow-up (2 week follow up) .  Discussed the use of AI scribe software for clinical note transcription with the patient, who gave verbal consent to proceed.  History of Present Illness [name] is a 69 year old female with COPD who presents with persistent runny nose and sneezing.  She has been experiencing a persistent runny nose and frequent sneezing since the end of December 2025, which she attributes to allergies. Despite using Zyrtec, there has been no relief, and she applies Vicks under her nose to manage the symptoms. No fever or cough is present. She has been living with three dogs for four years, but the symptoms only started recently.  She has a history of dizziness, which has improved since her last visit on December 23, 2023. She mentions a previous fall but has not experienced any recent falls. She feels unstable when first standing up but waits a moment before moving, which helps.  She has a history of a stroke.           Social history:  Relevant past medical, surgical, family and social history reviewed and updated as indicated. Interim medical history since our last visit reviewed.  Allergies and medications reviewed and updated.  DATA REVIEWED: CHART IN EPIC     ROS: Negative unless specifically indicated above in HPI.   Current Medications[1]        Objective:        BP 110/72 (BP Location: Right Arm, Patient Position: Sitting, Cuff Size: Normal)   Pulse 86   Temp 98.4 F (36.9 C) (Temporal)   Ht 5' 7 (1.702 m)   Wt 123 lb 6.4 oz (56 kg)   SpO2 99%   BMI 19.33 kg/m   Physical Exam HEENT: Nasal mucosa with swollen turbinates.  Wt Readings from Last 3 Encounters:  01/11/24 123 lb 6.4 oz (56 kg)  12/23/23 123 lb 3.2  oz (55.9 kg)  11/19/23 125 lb 3.2 oz (56.8 kg)    Physical Exam Vitals reviewed.  Constitutional:      General: She is not in acute distress.    Appearance: Normal appearance. She is normal weight. She is not ill-appearing, toxic-appearing or diaphoretic.  HENT:     Head: Normocephalic.     Right Ear: Tympanic membrane normal.     Left Ear: Tympanic membrane normal.     Nose: Mucosal edema present.     Right Turbinates: Swollen.     Right Sinus: No maxillary sinus tenderness or frontal sinus tenderness.     Left Sinus: No maxillary sinus tenderness or frontal sinus tenderness.     Mouth/Throat:     Mouth: Mucous membranes are dry.     Pharynx: No oropharyngeal exudate or posterior oropharyngeal erythema.  Eyes:     Extraocular Movements: Extraocular movements intact.     Pupils: Pupils are equal, round, and reactive to light.  Cardiovascular:     Rate and Rhythm: Normal rate and regular rhythm.     Pulses: Normal pulses.     Heart sounds: Normal heart sounds.  Pulmonary:     Effort: Pulmonary effort is normal.     Breath sounds: Normal breath sounds.  Musculoskeletal:     Cervical back: Normal  range of motion.  Neurological:     General: No focal deficit present.     Mental Status: She is alert and oriented to person, place, and time. Mental status is at baseline.  Psychiatric:        Mood and Affect: Mood normal.        Behavior: Behavior normal.        Thought Content: Thought content normal.        Judgment: Judgment normal.          Results   Assessment & Plan:   Assessment and Plan Assessment & Plan Allergic rhinitis Chronic allergic rhinitis with rhinorrhea and sneezing, likely exacerbated by exposure to dogs. Symptoms began at the end of last year and have persisted. Zyrtec has been ineffective. No sinus congestion or fever. Examination reveals nasal turbinate hypertrophy. - Initiated Flonase  nasal spray daily. - Continue Zyrtec daily. - Will reassess  symptoms in two weeks.  Erosive gastropathy Nausea associated with cheese consumption. Symptoms have improved with dietary modification. - Continue to avoid cheese to manage symptoms.  Unsteady gait and recent falls Unsteady gait with one fall since last visit. Dizziness has improved, but instability persists upon standing. No recent falls reported.  General Health Maintenance Discussion of flu vaccination status. Uncertain if received flu shot in 2025.        Return in about 6 months (around 07/10/2024) for f/u CPE.     [name], MSN, APRN, FNP-C [facility]        [1]  Current Outpatient Medications:    albuterol  (VENTOLIN  HFA) 108 (90 Base) MCG/ACT inhaler, Inhale 2 puffs into the lungs every 6 (six) hours as needed for wheezing or shortness of breath., Disp: 8 g, Rfl: 2   amLODipine  (NORVASC ) 5 MG tablet, TAKE 1 TABLET BY MOUTH DAILY, Disp: 100 tablet, Rfl: 2   aspirin  EC 81 MG EC tablet, Take 1 tablet (81 mg total) by mouth daily. Swallow whole., Disp: 30 tablet, Rfl: 11   famotidine  (PEPCID ) 10 MG tablet, Take 1 tablet (10 mg total) by mouth 2 (two) times daily., Disp: 60 tablet, Rfl: 2   omeprazole  (PRILOSEC) 20 MG capsule, Take 20 mg by mouth daily., Disp: , Rfl:    ondansetron  (ZOFRAN -ODT) 4 MG disintegrating tablet, Take 1 tablet (4 mg total) by mouth every 8 (eight) hours as needed for nausea or vomiting., Disp: 20 tablet, Rfl: 0   SUMAtriptan  (IMITREX ) 25 MG tablet, TAKE 1 TABLET BY MOUTH AT ONSET  OF HEADACHE, MAY REPEAT IN 2  HOURS IF NO IMPROVEMENT, Disp: 10 tablet, Rfl: 0   valACYclovir  (VALTREX ) 500 MG tablet, TAKE 1 TABLET BY MOUTH DAILY, Disp: 100 tablet, Rfl: 2  "

## 2024-02-03 ENCOUNTER — Ambulatory Visit

## 2024-02-03 VITALS — Ht 67.0 in | Wt 123.0 lb

## 2024-02-03 DIAGNOSIS — Z0000 Encounter for general adult medical examination without abnormal findings: Secondary | ICD-10-CM | POA: Diagnosis not present

## 2024-02-03 NOTE — Patient Instructions (Signed)
 Ms. Nesheim,  Thank you for taking the time for your Medicare Wellness Visit. I appreciate your continued commitment to your health goals. Please review the care plan we discussed, and feel free to reach out if I can assist you further.  Please note that Annual Wellness Visits do not include a physical exam. Some assessments may be limited, especially if the visit was conducted virtually. If needed, we may recommend an in-person follow-up with your provider.  Ongoing Care Seeing your primary care provider every 3 to 6 months helps us  monitor your health and provide consistent, personalized care.   Referrals If a referral was made during today's visit and you haven't received any updates within two weeks, please contact the referred provider directly to check on the status.  Recommended Screenings:  Health Maintenance  Topic Date Due   Hepatitis C Screening  Never done   Zoster (Shingles) Vaccine (1 of 2) Never done   Breast Cancer Screening  07/15/2017   DTaP/Tdap/Td vaccine (2 - Td or Tdap) 01/06/2018   COVID-19 Vaccine (3 - Moderna risk series) 07/12/2019   Osteoporosis screening with Bone Density Scan  Never done   Medicare Annual Wellness Visit  12/18/2023   Flu Shot  04/05/2024*   Cologuard (Stool DNA test)  11/12/2025   Pneumococcal Vaccine for age over 72  Completed   Meningitis B Vaccine  Aged Out   Colon Cancer Screening  Discontinued  *Topic was postponed. The date shown is not the original due date.       12/18/2022    2:28 PM  Advanced Directives  Does Patient Have a Medical Advance Directive? Yes  Type of Estate Agent of [place];Living will  Copy of Healthcare Power of Attorney in Chart? No - copy requested    Vision: Annual vision screenings are recommended for early detection of glaucoma, cataracts, and diabetic retinopathy. These exams can also reveal signs of chronic conditions such as diabetes and high blood pressure.  Dental: Annual  dental screenings help detect early signs of oral cancer, gum disease, and other conditions linked to overall health, including heart disease and diabetes.  Please see the attached documents for additional preventive care recommendations.

## 2024-02-03 NOTE — Progress Notes (Signed)
 "  Chief Complaint  Patient presents with   Medicare Wellness     Subjective:   [name] is a 69 y.o. female who presents for a Medicare Annual Wellness Visit.  Visit info / Clinical Intake: Medicare Wellness Visit Type:: Subsequent Annual Wellness Visit Persons participating in visit and providing information:: patient Medicare Wellness Visit Mode:: Telephone If telephone:: video declined Since this visit was completed virtually, some vitals may be partially provided or unavailable. Missing vitals are due to the limitations of the virtual format.: Unable to obtain vitals - no equipment If Telephone or Video please confirm:: I connected with patient using audio/video enable telemedicine. I verified patient identity with two identifiers, discussed telehealth limitations, and patient agreed to proceed. Patient Location:: home Provider Location:: home office Interpreter Needed?: No Pre-visit prep was completed: yes AWV questionnaire completed by patient prior to visit?: no Living arrangements:: (!) lives alone Patient's Overall Health Status Rating: good Typical amount of pain: none Does pain affect daily life?: (!) yes Are you currently prescribed opioids?: no  Dietary Habits and Nutritional Risks How many meals a day?: 2 Eats fruit and vegetables daily?: yes Most meals are obtained by: preparing own meals In the last 2 weeks, have you had any of the following?: none Diabetic:: no  Functional Status Activities of Daily Living (to include ambulation/medication): Independent Ambulation: Independent Medication Administration: Independent Home Management (perform basic housework or laundry): Independent Manage your own finances?: yes Primary transportation is: family / friends Concerns about vision?: no *vision screening is required for WTM* Concerns about hearing?: no  Fall Screening Falls in the past year?: 1 Number of falls in past year: 0 Was there an injury with  Fall?: 0 Fall Risk Category Calculator: 1 Patient Fall Risk Level: Low Fall Risk  Fall Risk Patient at Risk for Falls Due to: Orthopedic patient Fall risk Follow up: Falls evaluation completed; Education provided; Falls prevention discussed  Home and Transportation Safety: All rugs have non-skid backing?: yes All stairs or steps have railings?: yes Grab bars in the bathtub or shower?: yes (chair) Have non-skid surface in bathtub or shower?: yes Good home lighting?: yes Regular seat belt use?: yes Hospital stays in the last year:: no  Cognitive Assessment Difficulty concentrating, remembering, or making decisions? : no Will 6CIT or Mini Cog be Completed: yes What year is it?: 0 points What month is it?: 0 points Give patient an address phrase to remember (5 components): [address] [place]  About what time is it?: 0 points Count backwards from 20 to 1: 0 points Say the months of the year in reverse: 0 points Repeat the address phrase from earlier: 0 points 6 CIT Score: 0 points  Advance Directives (For Healthcare) Does Patient Have a Medical Advance Directive?: Yes Type of Advance Directive: Living will; Healthcare Power of Attorney Copy of Healthcare Power of Attorney in Chart?: No - copy requested Copy of Living Will in Chart?: No - copy requested  Reviewed/Updated  Reviewed/Updated: Reviewed All (Medical, Surgical, Family, Medications, Allergies, Care Teams, Patient Goals)    Allergies (verified) Celebrex  [celecoxib ], Sulfonamide derivatives, Ace inhibitors, Azithromycin , Dilaudid  [hydromorphone ], Erythromycin, Gabapentin , Lactose intolerance (gi), Prozac  [fluoxetine ], Sudafed [pseudoephedrine hcl], Losartan , and Mucinex  [guaifenesin  er]   Current Medications (verified) Outpatient Encounter Medications as of 02/03/2024  Medication Sig   albuterol  (VENTOLIN  HFA) 108 (90 Base) MCG/ACT inhaler Inhale 2 puffs into the lungs every 6 (six) hours as needed for  wheezing or shortness of breath.   amLODipine  (NORVASC )  5 MG tablet TAKE 1 TABLET BY MOUTH DAILY   aspirin  EC 81 MG EC tablet Take 1 tablet (81 mg total) by mouth daily. Swallow whole.   famotidine  (PEPCID ) 10 MG tablet Take 1 tablet (10 mg total) by mouth 2 (two) times daily.   omeprazole  (PRILOSEC) 20 MG capsule Take 20 mg by mouth daily.   ondansetron  (ZOFRAN -ODT) 4 MG disintegrating tablet Take 1 tablet (4 mg total) by mouth every 8 (eight) hours as needed for nausea or vomiting.   SUMAtriptan  (IMITREX ) 25 MG tablet TAKE 1 TABLET BY MOUTH AT ONSET  OF HEADACHE, MAY REPEAT IN 2  HOURS IF NO IMPROVEMENT   valACYclovir  (VALTREX ) 500 MG tablet TAKE 1 TABLET BY MOUTH DAILY   No facility-administered encounter medications on file as of 02/03/2024.    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   NECK MASS EXCISION     rt-mass   NECK SURGERY  1990   parotidectomy, one gland removed   SHOULDER ARTHROSCOPY WITH OPEN ROTATOR CUFF REPAIR AND DISTAL CLAVICLE ACROMINECTOMY Right 04/25/2014   Procedure: SHOULDER ARTHROSCOPY WITH MINI-OPEN ROTATOR CUFF REPAIR AND DISTAL CLAVICLE RESECTION;  Surgeon: Maude LELON Right, MD;  Location: [facility];  Service:  Orthopedics;  Laterality: Right;   SHOULDER CLOSED REDUCTION Right 08/31/2014   Procedure: CLOSED MANIPULATION SHOULDER;  Surgeon: Maude LELON Right, MD;  Location: [facility];  Service: Orthopedics;  Laterality: Right;   SUBACROMIAL DECOMPRESSION Right 04/25/2014   Procedure: SUBACROMIAL DECOMPRESSION;  Surgeon: Maude LELON Right, MD;  Location: [facility];  Service: Orthopedics;  Laterality: Right;   THYROID  SURGERY     bx right thyroid    Family History  Problem Relation Age of Onset   Hypertension Mother    Heart disease Mother        Irregular heart beat    Arrhythmia Mother    Rheum arthritis Mother    Pulmonary fibrosis Mother    Osteoporosis Mother    Colon cancer Mother    Lung cancer Father    COPD Father    Pulmonary fibrosis Father    Hypertension Sister    Hypertension Sister    Aneurysm Brother 11       brain   Heart disease Maternal Grandmother    Heart disease Maternal Aunt    Pulmonary fibrosis Maternal Aunt    Rheum arthritis Maternal Uncle    Pulmonary fibrosis Maternal Uncle    Diabetes Maternal Uncle    Colon polyps Neg Hx    Social History   Occupational History   Occupation: Disabled  Employer: [employer]    Comment: assembly line  Tobacco Use   Smoking status: Former    Current packs/day: 0.00    Average packs/day: 0.3 packs/day for 54.0 years (13.5 ttl pk-yrs)    Types: Cigarettes, E-cigarettes    Start date: 10/24/1960    Quit date: 10/25/2014    Years since quitting: 9.2   Smokeless tobacco: Never  Vaping Use   Vaping status: Never Used  Substance and Sexual Activity   Alcohol use: No   Drug use: No   Sexual activity: Not Currently   Tobacco Counseling Counseling given: Not Answered  SDOH Screenings   Food Insecurity: No Food Insecurity (02/03/2024)  Housing: Low Risk (02/03/2024)  Transportation Needs: No Transportation Needs (02/03/2024)  Utilities: Not At Risk (02/03/2024)  Alcohol Screen: Low Risk  (12/18/2022)  Depression (PHQ2-9): Low Risk (02/03/2024)  Financial Resource Strain: Low Risk (11/18/2023)  Physical Activity: Inactive (02/03/2024)  Social Connections: Socially Isolated (02/03/2024)  Stress: No Stress Concern Present (02/03/2024)  Tobacco Use: Medium Risk (02/03/2024)  Health Literacy: Adequate Health Literacy (02/03/2024)   See flowsheets for full screening details  Depression Screen PHQ 2 & 9 Depression Scale- Over the past 2 weeks, how often have you been bothered by any of the following problems? Little interest or pleasure in doing things: 0 Feeling down, depressed, or hopeless (PHQ Adolescent also includes...irritable): 0 PHQ-2 Total Score: 0 Trouble falling or staying asleep, or sleeping too much: 0 Feeling tired or having little energy: 0 Poor appetite or overeating (PHQ Adolescent also includes...weight loss): 0 Feeling bad about yourself - or that you are a failure or have let yourself or your family down: 0 Trouble concentrating on things, such as reading the newspaper or watching television (PHQ Adolescent also includes...like school work): 0 Moving or speaking so slowly that other people could have noticed. Or the opposite - being so fidgety or restless that you have been moving around a lot more than usual: 0 Thoughts that you would be better off dead, or of hurting yourself in some way: 0 PHQ-9 Total Score: 0 If you checked off any problems, how difficult have these problems made it for you to do your work, take care of things at home, or get along with other people?: Not difficult at all     Goals Addressed             This Visit's Progress    Maintain Healthy Lifestyle   On track    Stay active Stay hydrated Healthy diet             Objective:    Today's Vitals   02/03/24 1345  Weight: 123 lb (55.8 kg)  Height: 5' 7 (1.702 m)   Body mass index is 19.26 kg/m.  Hearing/Vision screen No results found. Immunizations and Health  Maintenance Health Maintenance  Topic Date Due   Hepatitis C Screening  Never done   Zoster Vaccines- Shingrix (1 of 2) Never done   Mammogram  07/15/2017   DTaP/Tdap/Td (2 - Td or Tdap) 01/06/2018   COVID-19 Vaccine (3 - Moderna risk series) 07/12/2019   Bone Density Scan  Never done   Medicare Annual Wellness (AWV)  12/18/2023   Influenza Vaccine  04/05/2024 (Originally 08/07/2023)   Fecal DNA (Cologuard)  11/12/2025   Pneumococcal Vaccine: 50+ Years  Completed   Meningococcal B Vaccine  Aged Out   Colonoscopy  Discontinued        Assessment/Plan:  This is a routine wellness examination  for [employer].  Patient Care Team: Corwin Antu, FNP as PCP - General (Family Medicine) Betsey Channel, MD as Consulting Physician (Nephrology) Rosalie Kitchens, MD as Consulting Physician (Gastroenterology) Leslee Reusing, MD as Consulting Physician (Ophthalmology) Maree Jannett POUR, MD as Consulting Physician (Neurology)  I have personally reviewed and noted the following in the patients chart:   Medical and social history Use of alcohol, tobacco or illicit drugs  Current medications and supplements including opioid prescriptions. Functional ability and status Nutritional status Physical activity Advanced directives List of other physicians Hospitalizations, surgeries, and ER visits in previous 12 months Vitals Screenings to include cognitive, depression, and falls Referrals and appointments  No orders of the defined types were placed in this encounter.  In addition, I have reviewed and discussed with patient certain preventive protocols, quality metrics, and best practice recommendations. A written personalized care plan for preventive services as well as general preventive health recommendations were provided to patient.   Erminio LITTIE Saris, LPN   8/71/7973    After Visit Summary: (MyChart) Due to this being a telephonic visit, the after visit summary with patients personalized plan  was offered to patient via MyChart   Nurse Notes: No voiced or noted concerns at this time Patient advised to keep follow-up appointment with PCP (July 2026) Vaccines not given: Shingel declined today Screenings not ordered: Declined Referral/Order for [facility], Bone Density Scan HM Addressed: pt declines [facility]  "

## 2024-02-12 ENCOUNTER — Telehealth: Payer: Self-pay | Admitting: Family

## 2024-02-12 NOTE — Telephone Encounter (Signed)
 Copied from CRM #[id number]. Topic: Clinical - Medication Refill >> Feb 12, 2024  2:44 PM Ahlexyia S wrote: Medication: SUMAtriptan  (IMITREX ) 25 MG tablet  Has the patient contacted their pharmacy? Yes, pt stated pharmacy is needing a new script. (Agent: If no, request that the patient contact the pharmacy for the refill. If patient does not wish to contact the pharmacy document the reason why and proceed with request.) (Agent: If yes, when and what did the pharmacy advise?)  This is the patient's preferred pharmacy:  [facility] - [place], [place] - [address] [place] [address] Phone: [phone] Fax: [phone]  Is this the correct pharmacy for this prescription? Yes If no, delete pharmacy and type the correct one.   Has the prescription been filled recently? No  Is the patient out of the medication? No, pt has 2 pills remaining.  Has the patient been seen for an appointment in the last year OR does the patient have an upcoming appointment? Yes  Can we respond through MyChart? Yes  Agent: Please be advised that Rx refills may take up to 3 business days. We ask that you follow-up with your pharmacy.

## 2024-07-11 ENCOUNTER — Encounter: Admitting: Family
